# Patient Record
Sex: Female | Born: 1940 | Race: Black or African American | Hispanic: No | Marital: Married | Smoking: Never smoker
Health system: Southern US, Community
[De-identification: ages and names within clinical notes are randomized; demographics above are authoritative.]

## PROBLEM LIST (undated history)

## (undated) DIAGNOSIS — M109 Gout, unspecified: Secondary | ICD-10-CM

## (undated) DIAGNOSIS — R112 Nausea with vomiting, unspecified: Secondary | ICD-10-CM

## (undated) DIAGNOSIS — R200 Anesthesia of skin: Secondary | ICD-10-CM

## (undated) DIAGNOSIS — I517 Cardiomegaly: Secondary | ICD-10-CM

## (undated) DIAGNOSIS — K5790 Diverticulosis of intestine, part unspecified, without perforation or abscess without bleeding: Secondary | ICD-10-CM

## (undated) DIAGNOSIS — H269 Unspecified cataract: Secondary | ICD-10-CM

## (undated) DIAGNOSIS — I722 Aneurysm of renal artery: Secondary | ICD-10-CM

## (undated) DIAGNOSIS — E669 Obesity, unspecified: Secondary | ICD-10-CM

## (undated) DIAGNOSIS — I519 Heart disease, unspecified: Secondary | ICD-10-CM

## (undated) DIAGNOSIS — I712 Thoracic aortic aneurysm, without rupture: Secondary | ICD-10-CM

## (undated) DIAGNOSIS — K648 Other hemorrhoids: Secondary | ICD-10-CM

## (undated) DIAGNOSIS — N950 Postmenopausal bleeding: Secondary | ICD-10-CM

## (undated) DIAGNOSIS — J189 Pneumonia, unspecified organism: Secondary | ICD-10-CM

## (undated) DIAGNOSIS — M7122 Synovial cyst of popliteal space [Baker], left knee: Secondary | ICD-10-CM

## (undated) DIAGNOSIS — S83289A Other tear of lateral meniscus, current injury, unspecified knee, initial encounter: Secondary | ICD-10-CM

## (undated) DIAGNOSIS — N281 Cyst of kidney, acquired: Secondary | ICD-10-CM

## (undated) DIAGNOSIS — E7800 Pure hypercholesterolemia, unspecified: Secondary | ICD-10-CM

## (undated) DIAGNOSIS — I700 Atherosclerosis of aorta: Secondary | ICD-10-CM

## (undated) DIAGNOSIS — E119 Type 2 diabetes mellitus without complications: Secondary | ICD-10-CM

## (undated) DIAGNOSIS — Z8669 Personal history of other diseases of the nervous system and sense organs: Secondary | ICD-10-CM

## (undated) DIAGNOSIS — Z862 Personal history of diseases of the blood and blood-forming organs and certain disorders involving the immune mechanism: Secondary | ICD-10-CM

## (undated) DIAGNOSIS — I10 Essential (primary) hypertension: Secondary | ICD-10-CM

## (undated) DIAGNOSIS — K7689 Other specified diseases of liver: Secondary | ICD-10-CM

## (undated) DIAGNOSIS — Z9889 Other specified postprocedural states: Secondary | ICD-10-CM

## (undated) DIAGNOSIS — M1990 Unspecified osteoarthritis, unspecified site: Secondary | ICD-10-CM

## (undated) HISTORY — PX: COLONOSCOPY: SHX174

## (undated) HISTORY — DX: Type 2 diabetes mellitus without complications: E11.9

## (undated) HISTORY — PX: ABDOMINAL HYSTERECTOMY: SHX81

## (undated) HISTORY — PX: DILATION AND CURETTAGE OF UTERUS: SHX78

---

## 2001-03-10 ENCOUNTER — Encounter: Payer: Self-pay | Admitting: *Deleted

## 2001-03-10 ENCOUNTER — Ambulatory Visit (HOSPITAL_COMMUNITY): Admission: RE | Admit: 2001-03-10 | Discharge: 2001-03-10 | Payer: Self-pay | Admitting: *Deleted

## 2002-04-23 ENCOUNTER — Encounter: Payer: Self-pay | Admitting: *Deleted

## 2002-04-23 ENCOUNTER — Ambulatory Visit (HOSPITAL_COMMUNITY): Admission: RE | Admit: 2002-04-23 | Discharge: 2002-04-23 | Payer: Self-pay | Admitting: *Deleted

## 2002-11-08 ENCOUNTER — Emergency Department (HOSPITAL_COMMUNITY): Admission: EM | Admit: 2002-11-08 | Discharge: 2002-11-08 | Payer: Self-pay | Admitting: Internal Medicine

## 2003-05-21 ENCOUNTER — Ambulatory Visit (HOSPITAL_COMMUNITY): Admission: RE | Admit: 2003-05-21 | Discharge: 2003-05-21 | Payer: Self-pay | Admitting: Family Medicine

## 2004-10-28 ENCOUNTER — Emergency Department (HOSPITAL_COMMUNITY): Admission: EM | Admit: 2004-10-28 | Discharge: 2004-10-28 | Payer: Self-pay | Admitting: Emergency Medicine

## 2004-11-04 ENCOUNTER — Ambulatory Visit: Payer: Self-pay | Admitting: Family Medicine

## 2004-11-05 ENCOUNTER — Ambulatory Visit (HOSPITAL_COMMUNITY): Admission: RE | Admit: 2004-11-05 | Discharge: 2004-11-05 | Payer: Self-pay | Admitting: Family Medicine

## 2005-03-04 ENCOUNTER — Ambulatory Visit: Payer: Self-pay | Admitting: Orthopedic Surgery

## 2005-03-09 ENCOUNTER — Ambulatory Visit (HOSPITAL_COMMUNITY): Admission: RE | Admit: 2005-03-09 | Discharge: 2005-03-09 | Payer: Self-pay | Admitting: Orthopedic Surgery

## 2005-03-15 ENCOUNTER — Ambulatory Visit: Payer: Self-pay | Admitting: Orthopedic Surgery

## 2005-04-12 ENCOUNTER — Ambulatory Visit: Payer: Self-pay | Admitting: Orthopedic Surgery

## 2005-05-03 ENCOUNTER — Ambulatory Visit: Payer: Self-pay | Admitting: Orthopedic Surgery

## 2006-08-13 ENCOUNTER — Emergency Department (HOSPITAL_COMMUNITY): Admission: EM | Admit: 2006-08-13 | Discharge: 2006-08-13 | Payer: Self-pay | Admitting: Emergency Medicine

## 2007-03-10 ENCOUNTER — Ambulatory Visit (HOSPITAL_COMMUNITY): Admission: RE | Admit: 2007-03-10 | Discharge: 2007-03-10 | Payer: Self-pay | Admitting: Family Medicine

## 2007-03-14 ENCOUNTER — Ambulatory Visit: Payer: Self-pay | Admitting: Gastroenterology

## 2007-04-25 ENCOUNTER — Ambulatory Visit: Payer: Self-pay | Admitting: Gastroenterology

## 2007-04-25 ENCOUNTER — Ambulatory Visit (HOSPITAL_COMMUNITY): Admission: RE | Admit: 2007-04-25 | Discharge: 2007-04-25 | Payer: Self-pay | Admitting: Gastroenterology

## 2007-04-25 ENCOUNTER — Encounter: Payer: Self-pay | Admitting: Gastroenterology

## 2008-10-17 ENCOUNTER — Ambulatory Visit: Payer: Self-pay | Admitting: Family Medicine

## 2008-10-17 DIAGNOSIS — K5730 Diverticulosis of large intestine without perforation or abscess without bleeding: Secondary | ICD-10-CM | POA: Insufficient documentation

## 2008-10-17 DIAGNOSIS — E669 Obesity, unspecified: Secondary | ICD-10-CM | POA: Insufficient documentation

## 2008-10-17 DIAGNOSIS — M109 Gout, unspecified: Secondary | ICD-10-CM

## 2008-10-17 DIAGNOSIS — E1169 Type 2 diabetes mellitus with other specified complication: Secondary | ICD-10-CM

## 2008-10-17 DIAGNOSIS — I10 Essential (primary) hypertension: Secondary | ICD-10-CM | POA: Insufficient documentation

## 2008-10-17 DIAGNOSIS — G43909 Migraine, unspecified, not intractable, without status migrainosus: Secondary | ICD-10-CM | POA: Insufficient documentation

## 2008-10-17 DIAGNOSIS — M129 Arthropathy, unspecified: Secondary | ICD-10-CM | POA: Insufficient documentation

## 2008-10-17 DIAGNOSIS — K648 Other hemorrhoids: Secondary | ICD-10-CM | POA: Insufficient documentation

## 2008-10-17 DIAGNOSIS — E785 Hyperlipidemia, unspecified: Secondary | ICD-10-CM

## 2008-10-17 LAB — CONVERTED CEMR LAB
Glucose, Urine, Semiquant: NEGATIVE
Ketones, urine, test strip: NEGATIVE
Nitrite: NEGATIVE
Protein, U semiquant: 30
Specific Gravity, Urine: 1.015
WBC Urine, dipstick: NEGATIVE
pH: 5.5

## 2008-10-18 ENCOUNTER — Encounter (INDEPENDENT_AMBULATORY_CARE_PROVIDER_SITE_OTHER): Payer: Self-pay | Admitting: Family Medicine

## 2008-10-21 LAB — CONVERTED CEMR LAB
Albumin: 3.8 g/dL (ref 3.5–5.2)
Alkaline Phosphatase: 104 units/L (ref 39–117)
BUN: 20 mg/dL (ref 6–23)
Basophils Relative: 0 % (ref 0–1)
Cholesterol: 176 mg/dL (ref 0–200)
Eosinophils Absolute: 0.1 10*3/uL (ref 0.0–0.7)
Eosinophils Relative: 1 % (ref 0–5)
HCT: 39.5 % (ref 36.0–46.0)
LDL Cholesterol: 107 mg/dL — ABNORMAL HIGH (ref 0–99)
Lymphocytes Relative: 34 % (ref 12–46)
MCHC: 32.9 g/dL (ref 30.0–36.0)
Monocytes Absolute: 0.4 10*3/uL (ref 0.1–1.0)
Monocytes Relative: 5 % (ref 3–12)
Neutro Abs: 4.7 10*3/uL (ref 1.7–7.7)
Platelets: 273 10*3/uL (ref 150–400)
RDW: 15.6 % — ABNORMAL HIGH (ref 11.5–15.5)
Total CHOL/HDL Ratio: 4.5
Triglycerides: 149 mg/dL (ref <150)

## 2008-10-28 ENCOUNTER — Encounter (INDEPENDENT_AMBULATORY_CARE_PROVIDER_SITE_OTHER): Payer: Self-pay | Admitting: Family Medicine

## 2008-11-01 ENCOUNTER — Ambulatory Visit: Payer: Self-pay | Admitting: Family Medicine

## 2008-11-01 DIAGNOSIS — R7309 Other abnormal glucose: Secondary | ICD-10-CM | POA: Insufficient documentation

## 2008-11-01 DIAGNOSIS — E876 Hypokalemia: Secondary | ICD-10-CM

## 2008-11-01 LAB — CONVERTED CEMR LAB
Cholesterol, target level: 200 mg/dL
LDL Goal: 130 mg/dL

## 2008-11-05 ENCOUNTER — Telehealth (INDEPENDENT_AMBULATORY_CARE_PROVIDER_SITE_OTHER): Payer: Self-pay | Admitting: Family Medicine

## 2008-11-15 ENCOUNTER — Ambulatory Visit (HOSPITAL_COMMUNITY): Admission: RE | Admit: 2008-11-15 | Discharge: 2008-11-15 | Payer: Self-pay | Admitting: Family Medicine

## 2009-04-10 ENCOUNTER — Encounter: Payer: Self-pay | Admitting: Family Medicine

## 2009-05-21 ENCOUNTER — Ambulatory Visit (HOSPITAL_COMMUNITY): Admission: RE | Admit: 2009-05-21 | Discharge: 2009-05-21 | Payer: Self-pay | Admitting: Orthopaedic Surgery

## 2010-06-28 ENCOUNTER — Encounter: Payer: Self-pay | Admitting: Family Medicine

## 2010-09-06 HISTORY — PX: CATARACT EXTRACTION: SUR2

## 2010-10-06 HISTORY — PX: CATARACT EXTRACTION: SUR2

## 2010-10-20 NOTE — Consult Note (Signed)
NAME:  [name], [name]               ACCOUNT NO.:  [id number]   MEDICAL RECORD NO.:  [id number]          PATIENT TYPE:  AMB   LOCATION:  DAY                           FACILITY:  [facility]   PHYSICIAN:  Kassie Mends, M.D.      DATE OF BIRTH:  1940/10/23   DATE OF CONSULTATION:  03/14/2007  DATE OF DISCHARGE:                                 CONSULTATION   REASON FOR CONSULTATION:  Hematochezia, schedule colonoscopy.   HISTORY OF PRESENT ILLNESS:  [name] is a pleasant 70 year old lady  who presents today to schedule colonoscopy at the request of Dr.  Joselyn Arrow office. The patient states she recently had 1 episode of  bright red blood per rectum. She had been putting off having a  colonoscopy for quite some time and this scared her enough to have her  go ahead and schedule the appointment today. She denies any  constipation, abdominal pain, melena, nausea, vomiting, dysphagia,  odynophagia, or weight loss. She occasionally has heartburn. Denies any  chest pain or shortness of breath. She has never had a colonoscopy. She  states her paternal first cousin has had colon cancer.   CURRENT MEDICATIONS:  Norvasc 10 mg daily, Simvastatin 20 mg daily,  Triamterene/HCTZ 37.5/25 mg daily, Allopurinol 100 mg daily,  Indomethacin 50 mg t.i.d. p.r.n.   ALLERGIES:  NO KNOWN DRUG ALLERGIES.   PAST MEDICAL HISTORY:  Hypertension, hypercholesterolemia, migraine  headaches, and gout.   PAST SURGICAL HISTORY:  Oophorectomy.   FAMILY HISTORY:  Father deceased at age 8 of unknown type of cancer.  Mother is 20 years old and has a history of hypertension, diabetes,  chronic renal insufficiency and congestive heart failure. Paternal first  cousin has had colorectal cancer. She had a sister who died of blood  clots.   SOCIAL HISTORY:  She is married and has 2 grown children. 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 She has never been a smoker. No  alcohol use.   REVIEW OF SYSTEMS:   CONSTITUTIONAL:  No weight loss. CARDIOPULMONARY:  See HPI. GENITOURINARY:  Denies history of hematuria.   PHYSICAL EXAMINATION:  VITAL SIGNS:  Weight 196. Height 5 foot 2.  Temperature 97.6. Blood pressure 130/80. Pulse 72.  GENERAL:  A pleasant, well developed, well nourished female in no acute  distress.  SKIN:  Warm and dry. No jaundice.  HEENT:  Sclerae nonicteric. Oropharyngeal mucosa moist and pink. No  lesions, erythema, or exudate.  NECK:  No lymphadenopathy or thyromegaly.  CHEST:  Lungs are clear to auscultation.  CARDIOVASCULAR:  Regular rate and rhythm. Normal S1 and S2. No murmurs,  rubs, or gallops.  ABDOMEN:  Positive bowel sounds. Soft, obese but symmetrical. Nontender.  No organomegaly or masses. No rebound tenderness with guarding. No  abdominal hernias.  EXTREMITIES:  No edema.   IMPRESSION:  [name] is a 70 year old lady who had a single episode  of hematochezia recently. This may be due to a benign anorectal source  such as hemorrhoids. She has never had colorectal cancer screening.  Recommend diagnostic colonoscopy in the near  future.   PLAN:  1. Colonoscopy with Dr. Kassie Mends. I have discussed risks,      alternatives, and benefits with regards to the risk of reaction to      medication, bleeding, infection, and perforation. The patient is      agreeable to proceed.   I want to think Dr. Reynolds Bowl for allowing [place] to take part in the  care of this patient.      Tana Coast, P.A.      Kassie Mends, M.D.  Electronically Signed    LL/MEDQ  D:  03/14/2007  T:  03/14/2007  Job:  [id number]   cc:   Reynolds Bowl, M.D.   Kassie Mends, M.D.  [address]  [place] , [place] [address]

## 2010-10-20 NOTE — Op Note (Signed)
[name], [name]               ACCOUNT NO.:  [id number]   MEDICAL RECORD NO.:  [id number]          PATIENT TYPE:  AMB   LOCATION:  DAY                           FACILITY:  [facility]   PHYSICIAN:  Kassie Mends, M.D.      DATE OF BIRTH:  1941/04/27   DATE OF PROCEDURE:  04/25/2007  DATE OF DISCHARGE:                               OPERATIVE REPORT   PROCEDURE:  Colonoscopy with cold forceps biopsy.   INDICATION FOR EXAM:  [name] is a 70 year old female who presents  with bright red blood per rectum that usually occurs with her bowel  movements.   FINDINGS:  1. Nodular appearing rectal mucosa.  Biopsies obtained via cold      forceps.  2. Moderate internal hemorrhoids.  Otherwise, no polyps, masses,      inflammatory changes or AVMs.  3. Sigmoid diverticulosis.   DIAGNOSIS:  The likely source of [name] intermittent hematochezia is  hemorrhoids.   RECOMMENDATIONS:  1. Will call [name] with results of her biopsies.  2. Will need a screening colonoscopy, most likely, in ten years.  3. No aspirin, NSAIDs or anticoagulation for five days.  4. She should follow a high fiber diet.  She is given a handout on      hemorrhoids and diverticulosis.  5. Anusol-HC per rectum every 12 hours for 14 days.  6. If her rectal bleeding persists, then would consider general      surgery referral for hemorrhoidectomy.   MEDICATIONS:  1. Demerol 50 mg IV.  2. Versed 4 mg IV.   PROCEDURE TECHNIQUE:  Physical exam was performed.  Informed consent was  obtained from the patient after explaining the benefits, risks and  alternatives to the procedure.  The patient was connected to the monitor  and placed in the left lateral position.  Continuous oxygen was provided  by nasal cannula. IV medicines were administered through an indwelling  cannula.  After the administration of sedation and rectal exam, the  patient's rectum was  intubated.  The scope was advanced under direct visualization to  the  cecum.  The scope was removed slowly by carefully examining the color,  texture, anatomy and integrity of the mucosa on the way out.  The  patient was recovered in endoscopy and discharged home in satisfactory  condition.      Kassie Mends, M.D.  Electronically Signed     SM/MEDQ  D:  04/25/2007  T:  04/25/2007  Job:  [id number]

## 2010-12-16 ENCOUNTER — Other Ambulatory Visit (HOSPITAL_COMMUNITY): Payer: Self-pay | Admitting: Internal Medicine

## 2010-12-16 DIAGNOSIS — Z139 Encounter for screening, unspecified: Secondary | ICD-10-CM

## 2010-12-24 ENCOUNTER — Ambulatory Visit (HOSPITAL_COMMUNITY): Payer: Medicare FFS

## 2010-12-29 ENCOUNTER — Ambulatory Visit (HOSPITAL_COMMUNITY)
Admission: RE | Admit: 2010-12-29 | Discharge: 2010-12-29 | Disposition: A | Discharge status: Home / Self Care | Payer: Medicare PPO | Source: Ambulatory Visit | Attending: Internal Medicine | Admitting: Internal Medicine

## 2010-12-29 DIAGNOSIS — Z1231 Encounter for screening mammogram for malignant neoplasm of breast: Secondary | ICD-10-CM | POA: Insufficient documentation

## 2010-12-29 DIAGNOSIS — Z139 Encounter for screening, unspecified: Secondary | ICD-10-CM

## 2012-04-02 ENCOUNTER — Emergency Department (HOSPITAL_COMMUNITY)
Admission: EM | Admit: 2012-04-02 | Discharge: 2012-04-02 | Disposition: A | Discharge status: Home / Self Care | Payer: Medicare Other | Attending: Emergency Medicine | Admitting: Emergency Medicine

## 2012-04-02 ENCOUNTER — Encounter (HOSPITAL_COMMUNITY): Payer: Self-pay

## 2012-04-02 ENCOUNTER — Emergency Department (HOSPITAL_COMMUNITY): Payer: Medicare Other

## 2012-04-02 DIAGNOSIS — Z79899 Other long term (current) drug therapy: Secondary | ICD-10-CM | POA: Insufficient documentation

## 2012-04-02 DIAGNOSIS — M109 Gout, unspecified: Secondary | ICD-10-CM | POA: Insufficient documentation

## 2012-04-02 DIAGNOSIS — E7800 Pure hypercholesterolemia, unspecified: Secondary | ICD-10-CM | POA: Insufficient documentation

## 2012-04-02 DIAGNOSIS — I10 Essential (primary) hypertension: Secondary | ICD-10-CM | POA: Insufficient documentation

## 2012-04-02 DIAGNOSIS — J40 Bronchitis, not specified as acute or chronic: Secondary | ICD-10-CM

## 2012-04-02 HISTORY — DX: Gout, unspecified: M10.9

## 2012-04-02 HISTORY — DX: Essential (primary) hypertension: I10

## 2012-04-02 HISTORY — DX: Pure hypercholesterolemia, unspecified: E78.00

## 2012-04-02 MED ORDER — PREDNISONE 20 MG PO TABS
60.0000 mg | ORAL_TABLET | Freq: Once | ORAL | Status: AC
  Administered 2012-04-02: 60 mg via ORAL
  Filled 2012-04-02: qty 2
  Filled 2012-04-02: qty 1

## 2012-04-02 MED ORDER — BENZONATATE 100 MG PO CAPS: 200.0000 mg | ORAL_CAPSULE | Freq: Two times a day (BID) | ORAL | Status: DC | PRN

## 2012-04-02 MED ORDER — PREDNISONE 20 MG PO TABS: 20.0000 mg | ORAL_TABLET | Freq: Two times a day (BID) | ORAL | Status: DC

## 2012-04-02 MED ORDER — IPRATROPIUM BROMIDE 0.02 % IN SOLN
0.5000 mg | Freq: Once | RESPIRATORY_TRACT | Status: AC
  Administered 2012-04-02: 0.5 mg via RESPIRATORY_TRACT
  Filled 2012-04-02: qty 2.5

## 2012-04-02 MED ORDER — BENZONATATE 100 MG PO CAPS
200.0000 mg | ORAL_CAPSULE | Freq: Once | ORAL | Status: AC
  Administered 2012-04-02: 200 mg via ORAL
  Filled 2012-04-02: qty 2

## 2012-04-02 MED ORDER — ALBUTEROL SULFATE HFA 108 (90 BASE) MCG/ACT IN AERS
2.0000 | INHALATION_SPRAY | RESPIRATORY_TRACT | Status: DC | PRN
  Administered 2012-04-02: 2 via RESPIRATORY_TRACT
  Filled 2012-04-02: qty 6.7

## 2012-04-02 MED ORDER — AEROCHAMBER Z-STAT PLUS/MEDIUM MISC: 1.0000 | Freq: Once | Status: DC

## 2012-04-02 MED ORDER — ALBUTEROL SULFATE (5 MG/ML) 0.5% IN NEBU
5.0000 mg | INHALATION_SOLUTION | Freq: Once | RESPIRATORY_TRACT | Status: AC
  Administered 2012-04-02: 5 mg via RESPIRATORY_TRACT
  Filled 2012-04-02: qty 1

## 2012-04-02 NOTE — Progress Notes (Signed)
Instructed patient on use of Albuterol MDI with spacer. Patient was able to demonstrate her understanding. 

## 2012-04-02 NOTE — ED Notes (Signed)
C/o cough that is persistent and unable to catch breath at times. C/o tightness in chest when coughs. Resp even/nonlabored. Dry cough present in triage.

## 2012-04-02 NOTE — ED Provider Notes (Cosign Needed)
History     CSN: [id number]  Arrival date & time 04/02/12  1134   First MD Initiated Contact with Patient 04/02/12 1304      Chief Complaint  Patient presents with  . Cough    (Consider location/radiation/quality/duration/timing/severity/associated sxs/prior treatment) HPI Comments: [name] is a 71 y.o. Female who presents complaining of cough that is worsening. It is nonproductive. She has mild, shortness of breath. Symptoms began with a sore throat, URI symptoms, and vomiting 5 days ago. She improved, after 1 day, then been worse  since. She had a fever initially, but none now. She is tolerating oral fluids, and fluid. She does not feel weak or dizzy. She is taking over-the-counter cough medicines without relief. She's taking her regular medications. There are no known modifying factors.  Patient is a 71 y.o. female presenting with cough. The history is provided by the patient.  Cough    Past Medical History  Diagnosis Date  . Hypertension   . Hypercholesteremia   . Gout     Past Surgical History  Procedure Date  . Abdominal hysterectomy     No family history on file.  History  Substance Use Topics  . Smoking status: Never Smoker   . Smokeless tobacco: Not on file  . Alcohol Use: No    OB History    Grav Para Term Preterm Abortions TAB SAB Ect Mult Living                  Review of Systems  Respiratory: Positive for cough.   All other systems reviewed and are negative.    Allergies  Review of patient's allergies indicates no known allergies.  Home Medications   Current Outpatient Rx  Name Route Sig Dispense Refill  . ALLOPURINOL 100 MG PO TABS Oral Take 100 mg by mouth daily.    Marland Kitchen AMLODIPINE BESYLATE 10 MG PO TABS Oral Take 10 mg by mouth daily.    Marland Kitchen LOSARTAN POTASSIUM 100 MG PO TABS Oral Take 100 mg by mouth daily.    Marland Kitchen PHENYLEPHRINE-APAP-GUAIFENESIN 5-325-200 MG PO TABS Oral Take 2 tablets by mouth 2 (two) times daily.    Marland Kitchen SIMVASTATIN 40  MG PO TABS Oral Take 40 mg by mouth every evening.    Marland Kitchen BENZONATATE 100 MG PO CAPS Oral Take 2 capsules (200 mg total) by mouth 2 (two) times daily as needed for cough. 30 capsule 0  . PREDNISONE 20 MG PO TABS Oral Take 1 tablet (20 mg total) by mouth 2 (two) times daily. 10 tablet 0    BP 140/77  Pulse 80  Temp 97.9 F (36.6 C) (Oral)  Resp 18  Ht 5\' 2"  (1.575 m)  Wt 195 lb (88.451 kg)  BMI 35.67 kg/m2  SpO2 95%  Physical Exam  Nursing note and vitals reviewed. Constitutional: She is oriented to person, place, and time. She appears well-developed and well-nourished.  HENT:  Head: Normocephalic and atraumatic.  Eyes: Conjunctivae normal and EOM are normal. Pupils are equal, round, and reactive to light.  Neck: Normal range of motion and phonation normal. Neck supple.  Cardiovascular: Normal rate, regular rhythm and intact distal pulses.   Pulmonary/Chest: Effort normal. She exhibits no tenderness.       Mildly prolonged expirations, and no wheezes, rales, or rhonchi  Abdominal: Soft. She exhibits no distension. There is no tenderness. There is no guarding.  Musculoskeletal: Normal range of motion.  Neurological: She is alert and oriented to person, place, and  time. She has normal strength. She exhibits normal muscle tone.  Skin: Skin is warm and dry.  Psychiatric: She has a normal mood and affect. Her behavior is normal. Judgment and thought content normal.    ED Course  Procedures (including critical care time) \ Clinical syndrome consistent with bronchitis. We'll try nebulizer , and prednisone. Patient reports improvement after treatment. Cough has resolved.  Nursing notes, applicable records and vitals reviewed.  Radiologic Images/Reports reviewed.    Labs Reviewed - No data to display Dg Chest 2 View  04/02/2012  *RADIOLOGY REPORT*  Clinical Data: Cough, hypertension.  CHEST - 2 VIEW  Comparison: 11/05/2004  Findings: Tortuous ectatic thoracic aorta.  Heart size  upper limits normal.  Stable perihilar and bibasilar interstitial prominence. No confluent airspace infiltrate or overt edema.  No effusion. Spurring in the lower thoracic spine.  IMPRESSION:  1.  Stable bibasilar interstitial prominence.  No acute disease.   Original Report Authenticated By: Thora Lance III, M.D.      1. Bronchitis       MDM  Signs and symptoms of bronchitis, post URI. Doubt pneumonia, PE, or ACS. Doubt metabolic instability, serious bacterial infection or impending vascular collapse; the patient is stable for discharge.     Plan: Home Medications- prednisone albuterol inhaler, and Tessalon Perles; Home Treatments- rest; Recommended follow up- PCP of choice, when necessary   [name], MD 04/02/12 2205

## 2012-06-07 DIAGNOSIS — S83289A Other tear of lateral meniscus, current injury, unspecified knee, initial encounter: Secondary | ICD-10-CM

## 2012-06-07 DIAGNOSIS — M7122 Synovial cyst of popliteal space [Baker], left knee: Secondary | ICD-10-CM

## 2012-06-07 HISTORY — DX: Other tear of lateral meniscus, current injury, unspecified knee, initial encounter: S83.289A

## 2012-06-07 HISTORY — DX: Synovial cyst of popliteal space (Baker), left knee: M71.22

## 2012-09-21 ENCOUNTER — Other Ambulatory Visit (HOSPITAL_COMMUNITY): Payer: Self-pay | Admitting: Internal Medicine

## 2012-09-21 DIAGNOSIS — R52 Pain, unspecified: Secondary | ICD-10-CM

## 2012-09-21 DIAGNOSIS — R609 Edema, unspecified: Secondary | ICD-10-CM

## 2012-09-26 ENCOUNTER — Ambulatory Visit (HOSPITAL_COMMUNITY): Payer: Medicare Other

## 2012-09-27 ENCOUNTER — Ambulatory Visit (HOSPITAL_COMMUNITY)
Admission: RE | Admit: 2012-09-27 | Discharge: 2012-09-27 | Disposition: A | Discharge status: Home / Self Care | Payer: Medicare Other | Source: Ambulatory Visit | Attending: Internal Medicine | Admitting: Internal Medicine

## 2012-09-27 DIAGNOSIS — M23349 Other meniscus derangements, anterior horn of lateral meniscus, unspecified knee: Secondary | ICD-10-CM | POA: Insufficient documentation

## 2012-09-27 DIAGNOSIS — R609 Edema, unspecified: Secondary | ICD-10-CM

## 2012-09-27 DIAGNOSIS — M25569 Pain in unspecified knee: Secondary | ICD-10-CM | POA: Insufficient documentation

## 2012-09-27 DIAGNOSIS — R52 Pain, unspecified: Secondary | ICD-10-CM

## 2014-03-29 ENCOUNTER — Other Ambulatory Visit (HOSPITAL_COMMUNITY): Payer: Self-pay | Admitting: Internal Medicine

## 2014-03-29 DIAGNOSIS — N939 Abnormal uterine and vaginal bleeding, unspecified: Secondary | ICD-10-CM

## 2014-04-02 ENCOUNTER — Other Ambulatory Visit (HOSPITAL_COMMUNITY): Payer: Self-pay | Admitting: Internal Medicine

## 2014-04-02 DIAGNOSIS — N939 Abnormal uterine and vaginal bleeding, unspecified: Secondary | ICD-10-CM

## 2014-04-03 ENCOUNTER — Ambulatory Visit (HOSPITAL_COMMUNITY): Payer: Medicare HMO

## 2014-04-03 ENCOUNTER — Ambulatory Visit (HOSPITAL_COMMUNITY)
Admission: RE | Admit: 2014-04-03 | Discharge: 2014-04-03 | Disposition: A | Discharge status: Home / Self Care | Payer: Medicare HMO | Source: Ambulatory Visit | Attending: Internal Medicine | Admitting: Internal Medicine

## 2014-04-03 DIAGNOSIS — N939 Abnormal uterine and vaginal bleeding, unspecified: Secondary | ICD-10-CM | POA: Diagnosis not present

## 2014-04-03 DIAGNOSIS — Z90710 Acquired absence of both cervix and uterus: Secondary | ICD-10-CM | POA: Diagnosis not present

## 2014-05-18 ENCOUNTER — Encounter (HOSPITAL_COMMUNITY): Payer: Self-pay | Admitting: Emergency Medicine

## 2014-05-18 ENCOUNTER — Emergency Department (HOSPITAL_COMMUNITY)
Admission: EM | Admit: 2014-05-18 | Discharge: 2014-05-18 | Disposition: A | Discharge status: Home / Self Care | Payer: Commercial Managed Care - HMO | Attending: Emergency Medicine | Admitting: Emergency Medicine

## 2014-05-18 ENCOUNTER — Emergency Department (HOSPITAL_COMMUNITY): Payer: Commercial Managed Care - HMO

## 2014-05-18 DIAGNOSIS — Y9289 Other specified places as the place of occurrence of the external cause: Secondary | ICD-10-CM | POA: Diagnosis not present

## 2014-05-18 DIAGNOSIS — E780 Pure hypercholesterolemia: Secondary | ICD-10-CM | POA: Diagnosis not present

## 2014-05-18 DIAGNOSIS — W19XXXA Unspecified fall, initial encounter: Secondary | ICD-10-CM

## 2014-05-18 DIAGNOSIS — Z7952 Long term (current) use of systemic steroids: Secondary | ICD-10-CM | POA: Insufficient documentation

## 2014-05-18 DIAGNOSIS — Y998 Other external cause status: Secondary | ICD-10-CM | POA: Diagnosis not present

## 2014-05-18 DIAGNOSIS — M109 Gout, unspecified: Secondary | ICD-10-CM | POA: Diagnosis not present

## 2014-05-18 DIAGNOSIS — S8391XA Sprain of unspecified site of right knee, initial encounter: Secondary | ICD-10-CM | POA: Diagnosis not present

## 2014-05-18 DIAGNOSIS — Y9389 Activity, other specified: Secondary | ICD-10-CM | POA: Diagnosis not present

## 2014-05-18 DIAGNOSIS — I10 Essential (primary) hypertension: Secondary | ICD-10-CM | POA: Diagnosis not present

## 2014-05-18 DIAGNOSIS — S8392XA Sprain of unspecified site of left knee, initial encounter: Secondary | ICD-10-CM | POA: Insufficient documentation

## 2014-05-18 DIAGNOSIS — Z79899 Other long term (current) drug therapy: Secondary | ICD-10-CM | POA: Insufficient documentation

## 2014-05-18 DIAGNOSIS — S8992XA Unspecified injury of left lower leg, initial encounter: Secondary | ICD-10-CM | POA: Diagnosis present

## 2014-05-18 DIAGNOSIS — W010XXA Fall on same level from slipping, tripping and stumbling without subsequent striking against object, initial encounter: Secondary | ICD-10-CM | POA: Diagnosis not present

## 2014-05-18 MED ORDER — HYDROCODONE-ACETAMINOPHEN 5-325 MG PO TABS: ORAL_TABLET | ORAL | Status: DC

## 2014-05-18 MED ORDER — HYDROCODONE-ACETAMINOPHEN 5-325 MG PO TABS
1.0000 | ORAL_TABLET | Freq: Once | ORAL | Status: AC
  Administered 2014-05-18: 1 via ORAL
  Filled 2014-05-18: qty 1

## 2014-05-18 NOTE — Discharge Instructions (Signed)

## 2014-05-18 NOTE — ED Provider Notes (Signed)
CSN: [id number]     Arrival date & time 05/18/14  1644 History   First MD Initiated Contact with Patient 05/18/14 1803     Chief Complaint  Patient presents with  . Fall     (Consider location/radiation/quality/duration/timing/severity/associated sxs/prior Treatment) HPI   [name] is a 73 y.o. female who presents to the Emergency Department complaining of bilateral knee pain after a fall earlier today.  States she tripped over a box and landed on both knees.  States pain is worse to the right knee.  She also reports mild swelling.  She has taken a ES Tylenol and Naprosyn with minimal relief.  She states that she is able to stand and bear weight, but the painful.  She denies numbness, weakness of the extremities, open wounds or other injuries.     Past Medical History  Diagnosis Date  . Hypertension   . Hypercholesteremia   . Gout    Past Surgical History  Procedure Laterality Date  . Abdominal hysterectomy     History reviewed. No pertinent family history. History  Substance Use Topics  . Smoking status: Never Smoker   . Smokeless tobacco: Not on file  . Alcohol Use: No   OB History    No data available     Review of Systems  Constitutional: Negative for fever and chills.  Genitourinary: Negative for dysuria and difficulty urinating.  Musculoskeletal: Positive for joint swelling and arthralgias. Negative for neck pain.  Skin: Negative for color change and wound.  Neurological: Negative for dizziness, facial asymmetry, weakness, numbness and headaches.  All other systems reviewed and are negative.     Allergies  Review of patient's allergies indicates no known allergies.  Home Medications   Prior to Admission medications   Medication Sig Start Date End Date Taking? Authorizing Provider  allopurinol (ZYLOPRIM) 100 MG tablet Take 100 mg by mouth daily.    Historical Provider, MD  amLODipine (NORVASC) 10 MG tablet Take 10 mg by mouth daily.    Historical  Provider, MD  benzonatate (TESSALON) 100 MG capsule Take 2 capsules (200 mg total) by mouth 2 (two) times daily as needed for cough. 04/02/12   Flint MelterElliott L Wentz, MD  losartan (COZAAR) 100 MG tablet Take 100 mg by mouth daily.    Historical Provider, MD  Phenylephrine-APAP-Guaifenesin [facility](MUCINEX SINUS-MAX PRESS & PAIN) 5-325-200 MG TABS Take 2 tablets by mouth 2 (two) times daily.    Historical Provider, MD  predniSONE (DELTASONE) 20 MG tablet Take 1 tablet (20 mg total) by mouth 2 (two) times daily. 04/02/12   Flint MelterElliott L Wentz, MD  simvastatin (ZOCOR) 40 MG tablet Take 40 mg by mouth every evening.    Historical Provider, MD   BP 136/82 mmHg  Pulse 68  Temp(Src) 98.5 F (36.9 C) (Oral)  Resp 20  Ht 5\' 2"  (1.575 m)  Wt 190 lb (86.183 kg)  BMI 34.74 kg/m2  SpO2 97% Physical Exam  Constitutional: She is oriented to person, place, and time. She appears well-developed and well-nourished. No distress.  HENT:  Head: Normocephalic and atraumatic.  Neck: Normal range of motion. Neck supple.  Cardiovascular: Normal rate, regular rhythm, normal heart sounds and intact distal pulses.   No murmur heard. Pulmonary/Chest: Effort normal and breath sounds normal. No respiratory distress.  Musculoskeletal: She exhibits edema and tenderness.  ttp of the bilateral knees, Right > left.  No erythema, effusion, or step-off deformity.  Pt has full ROM with pain reproduced with flexion.  DP  pulse brisk, distal sensation intact. Calf is soft and NT.  Neurological: She is alert and oriented to person, place, and time. She exhibits normal muscle tone. Coordination normal.  Skin: Skin is warm and dry. No erythema.  Nursing note and vitals reviewed.   ED Course  Procedures (including critical care time) Labs Review Labs Reviewed - No data to display  Imaging Review Dg Knee Complete 4 Views Left  05/18/2014   CLINICAL DATA:  Larey SeatFell, pain anterior knees, swelling anterior right knee below patella, prior  arthroscopic surgery on left knee  EXAM: LEFT KNEE - COMPLETE 4+ VIEW  COMPARISON:  None.  FINDINGS: No fracture.  No dislocation.  There is tricompartmental joint space narrowing most evident involving the lateral compartment. Marginal osteophytes project from all 3 compartments. There is diffuse bone demineralization. No joint effusion. Soft tissues are unremarkable.  IMPRESSION: No fracture, dislocation or acute finding.   Electronically Signed   By: Amie Portlandavid  Ormond M.D.   On: 05/18/2014 19:16   Dg Knee Complete 4 Views Right  05/18/2014   CLINICAL DATA:  Larey SeatFell, pain anterior knees, swelling anterior right knee below patella, prior arthroscopic surgery on left knee  EXAM: RIGHT KNEE - COMPLETE 4+ VIEW  COMPARISON:  None.  FINDINGS: No fracture or dislocation.  There is tricompartmental joint space narrowing, most evident involving the lateral compartment. There are prominent marginal osteophytes from all 3 compartments. There is no significant joint effusion. Soft tissues are unremarkable. Bones are demineralized.  IMPRESSION: No fracture or dislocation or acute finding.   Electronically Signed   By: Amie Portlandavid  Ormond M.D.   On: 05/18/2014 19:15     EKG Interpretation None      MDM   Final diagnoses:  Fall  Knee sprain, right, initial encounter  Knee sprain, left, initial encounter   Pt with bilateral knee pain after a fall directly onto her knees.  Pt has full ROM of both knees without obvious ligament instability or bony deformity.  ACE wrap applied.  Patient reports improvement in her sx's after vicodin.  She agrees to elevate, ice and close orthopedic f/u in few days if the sx's are not improving.        Jaxden Blyden L. Rowe Robertriplett, PA-C 05/20/14 1208  Glynn OctaveStephen Rancour, MD 05/20/14 [phone]

## 2014-05-18 NOTE — ED Notes (Signed)
Patient with no complaints at this time. Respirations even and unlabored. Skin warm/dry. Discharge instructions reviewed with patient at this time. Patient given opportunity to voice concerns/ask questions. Patient discharged at this time and left Emergency Department with steady gait.   

## 2014-05-18 NOTE — ED Notes (Signed)
Pt tripped over box in floor and when she landed, she hit both knees. Pt c/o bilateral knee pain and difficulty standing.

## 2014-07-29 DIAGNOSIS — M170 Bilateral primary osteoarthritis of knee: Secondary | ICD-10-CM | POA: Diagnosis not present

## 2014-08-18 DIAGNOSIS — Z0100 Encounter for examination of eyes and vision without abnormal findings: Secondary | ICD-10-CM | POA: Diagnosis not present

## 2014-08-21 DIAGNOSIS — H524 Presbyopia: Secondary | ICD-10-CM | POA: Diagnosis not present

## 2014-08-21 DIAGNOSIS — H5210 Myopia, unspecified eye: Secondary | ICD-10-CM | POA: Diagnosis not present

## 2014-09-16 ENCOUNTER — Other Ambulatory Visit: Payer: Self-pay | Admitting: *Deleted

## 2014-09-16 NOTE — Patient Outreach (Signed)
[facility]) Care Management  09/16/2014  Doreen BeamBessie Y Lerew 1941/05/16 [id number]  Telephone contact with patient for disease management. Patient states she has received [employer]  educational materials on hypertension and has reviewed them.   States she is currently taking blood pressure at home on weekly basis. States she does not know her target blood pressure range. Patient referred to MD office to get target blood pressure. RN case manager reviewed symptoms that may indicate that blood pressure is above the norm. Patient was able to states 2 reasons to call for emergency medical help.   Plan: will continue with interventions as listed in Care Plan.  Patient agrees with next appointment date.  Colleen CanLinda Anjolina Byrer, RN BSN CCM Care Management Coordinator [facility]  [phone]

## 2014-09-26 ENCOUNTER — Other Ambulatory Visit: Payer: Self-pay | Admitting: *Deleted

## 2014-09-26 NOTE — Patient Outreach (Signed)
[facility]) Care Management  09/26/2014  [name] 06-Jul-1940 [id number]   Follow up for disease management. Patient states she has received Transport plannereducational materials.  Patient states unable to complete health assessments today. Wants to be called back tomorrow.  Plan: Set appointment for telephone call. Patient agrees with appointment time. Patient also agrees to read educational materials.  Melanie CanLinda Alieah Brinton, RN BSN CCM Care Management Coordinator [facility] Care [facility]  [phone]

## 2014-10-03 ENCOUNTER — Encounter: Payer: Self-pay | Admitting: *Deleted

## 2014-10-04 ENCOUNTER — Other Ambulatory Visit: Payer: Self-pay | Admitting: *Deleted

## 2014-10-04 ENCOUNTER — Encounter: Payer: Self-pay | Admitting: *Deleted

## 2014-10-04 VITALS — Ht 62.0 in | Wt 190.0 lb

## 2014-10-04 DIAGNOSIS — I10 Essential (primary) hypertension: Secondary | ICD-10-CM

## 2014-10-04 NOTE — Patient Outreach (Signed)
[facility]) Care Management  10/04/2014  Doreen BeamBessie Y Mawhinney [birth year] [id number]  Follow up call to patient for disease management. Patient states has been taking blood pressure several times weekly which range from 126/69 to 150/80.  States she is recording values and plans to take to MD office. Next appointment with primary care scheduled for November 17, 2014.  No hospital admissions or emergency room admissions since we spoke last. Reinforcement and review of  hypertension educational materials done with patient.   Plan: with continue with care plan as noted. Will [facility] along with Advance Directive information. Follow up scheduled for next month. Patient agrees with appointment time.  Colleen CanLinda Evonte Prestage, RN BSN CCM Care Management Coordinator [facility] Care Management  [phone]  .

## 2014-10-07 ENCOUNTER — Encounter: Payer: Self-pay | Admitting: *Deleted

## 2014-10-25 ENCOUNTER — Other Ambulatory Visit: Payer: Self-pay | Admitting: *Deleted

## 2014-10-25 NOTE — Patient Outreach (Signed)
[facility]) Care Management  10/25/2014  Doreen BeamBessie Y Navarette 29-Mar-1941 [id number]  Telephone follow up with patient -Disease Management -Hypertension. Patient voices that she is currently taking blood pressure several times weekly and recording. Plans to take values to next doctor's appointment in June. This week blood pressure ranges from 124/73 to 134/70. States she is walking on regular basis and plans to start going to gym. States she has [employer] benefit which she will utilize.   Interventions in care plan re-inforced with patient.  States she is taking medications as directed and has had no changes in prescriptions.    Plan-will continue to follow care plan as noted.  Patient agrees with next telephonic appointment   Colleen CanLinda Carmen Vallecillo, RN BSN CCM Care Management Coordinator [facility] Care Management  [phone]

## 2014-11-19 DIAGNOSIS — R7301 Impaired fasting glucose: Secondary | ICD-10-CM | POA: Diagnosis not present

## 2014-11-19 DIAGNOSIS — E782 Mixed hyperlipidemia: Secondary | ICD-10-CM | POA: Diagnosis not present

## 2014-11-19 DIAGNOSIS — I10 Essential (primary) hypertension: Secondary | ICD-10-CM | POA: Diagnosis not present

## 2014-11-20 ENCOUNTER — Other Ambulatory Visit: Payer: Self-pay | Admitting: *Deleted

## 2014-11-20 NOTE — Patient Outreach (Signed)
[facility]) Care Management  11/20/2014  [name] 05/19/1941 [id number]  Follow up call to patient regarding disease management.  Patient reports that she has had blood work drawn and will have primary care office visit tomorrow.  States she has been checking her blood pressure weekly and recording values. States this week blood pressure reading is 134/74. States she is consistently walking at least 30 minutes 5 days weekly. Has lost 8 lbs in 1 month. States she is taking medications as prescribed consistently , watching her food intake by cutting down on portions and bread. States following low salt diet. States over all she feels better. Legs feel stronger, and she is sleeping better.  Plan-Will follow up with patient post primary care office visit. Continue with care plan as noted. Patient agrees with appointment set up for next telephonic appointment.   Colleen Can, RN BSN CCM Care Management Coordinator [facility] Care Management  [phone]

## 2014-11-21 DIAGNOSIS — E876 Hypokalemia: Secondary | ICD-10-CM | POA: Diagnosis not present

## 2014-11-21 DIAGNOSIS — I10 Essential (primary) hypertension: Secondary | ICD-10-CM | POA: Diagnosis not present

## 2014-11-21 DIAGNOSIS — E1165 Type 2 diabetes mellitus with hyperglycemia: Secondary | ICD-10-CM | POA: Diagnosis not present

## 2014-11-21 DIAGNOSIS — E785 Hyperlipidemia, unspecified: Secondary | ICD-10-CM | POA: Diagnosis not present

## 2015-01-01 DIAGNOSIS — I10 Essential (primary) hypertension: Secondary | ICD-10-CM | POA: Diagnosis not present

## 2015-01-01 DIAGNOSIS — E1165 Type 2 diabetes mellitus with hyperglycemia: Secondary | ICD-10-CM | POA: Diagnosis not present

## 2015-01-01 DIAGNOSIS — E119 Type 2 diabetes mellitus without complications: Secondary | ICD-10-CM | POA: Diagnosis not present

## 2015-01-01 DIAGNOSIS — E876 Hypokalemia: Secondary | ICD-10-CM | POA: Diagnosis not present

## 2015-01-01 DIAGNOSIS — E785 Hyperlipidemia, unspecified: Secondary | ICD-10-CM | POA: Diagnosis not present

## 2015-01-15 ENCOUNTER — Other Ambulatory Visit: Payer: Self-pay | Admitting: *Deleted

## 2015-01-15 DIAGNOSIS — E119 Type 2 diabetes mellitus without complications: Secondary | ICD-10-CM

## 2015-01-15 NOTE — Patient Outreach (Signed)
[facility]) Care Management  01/15/2015  [name] 1941-01-24 [id number]  Telephone call to patient who voices that she continues to maintain good blood pressure that runs in 130's/70's.  States her MD is pleased with her numbers and states her target should be where it is now 130;s/70's. Patient taking medications as prescribed by MD.  Patient states new concern is she has new diagnosis of diabetes and has been placed on oral medication. States Hemoglobin A1c level is currently 8.5 and MD wants it down to 7.5. States she is scheduled to see nutritionist. States she has glucometer and is checking her blood sugar daily.   Goals for hypertension disease management have been met. Patient concerned about diabetes diagnosis. Patient advised that telephonic follow up and disease management could continue for new diagnosis of diabetes. Patient consents.  Plan: will refer to [facility] for Diabetes disease management.   Update sent to MD via letter. Melanie Daisy, RN BSN [place] Management Coordinator [facility] Care Management  [phone]

## 2015-01-24 ENCOUNTER — Encounter: Payer: Self-pay | Admitting: *Deleted

## 2015-01-24 NOTE — Patient Outreach (Signed)
[facility]) Care Management  01/24/2015  THRESEA DOBLE 01/28/1941 [id number]   Request from Colleen Can, RN to assign RN Health Coach, Fleeta Emmer, RN assigned.  Thanks, Corrie Mckusick. Sharlee Blew [facility] Care Management [facility] CM Assistant Phone: [phone] Fax: [phone]

## 2015-01-28 ENCOUNTER — Other Ambulatory Visit: Payer: Self-pay

## 2015-01-28 NOTE — Patient Outreach (Signed)
[facility]) Care Management  [facility] Care Manager  01/28/2015   [name] December 18, 1940 [id number]  Telephone call to patient for initial health coach outreach.  Patient receptive to call.  Patient newly diagnosed diabetic 2 months ago per patient.  She reports her last A1c was 7.5 but MD goal is 6.5.  Patient reports recent increase of metformin and her blood sugars in the am are in the 90's.  Patient is active.  She reports she walks daily and tries to do 10,000 steps per day.  Discussed with patient diabetic diet and answered patient questions.  Patient has appointment for diabetic teaching in September.    Current Medications:  Current Outpatient Prescriptions  Medication Sig Dispense Refill  . allopurinol (ZYLOPRIM) 100 MG tablet Take 100 mg by mouth daily.    Marland Kitchen amLODipine (NORVASC) 10 MG tablet Take 10 mg by mouth daily.    . chlorthalidone (HYGROTON) 25 MG tablet Take 12.5 mg by mouth daily.    Marland Kitchen losartan (COZAAR) 100 MG tablet Take 100 mg by mouth daily.    . metFORMIN (GLUCOPHAGE) 1000 MG tablet Take 1,000 mg by mouth 2 (two) times daily with a meal.    . naproxen (NAPROSYN) 500 MG tablet Take 500 mg by mouth 2 (two) times daily.    . simvastatin (ZOCOR) 40 MG tablet Take 40 mg by mouth every evening.    . benzonatate (TESSALON) 100 MG capsule Take 2 capsules (200 mg total) by mouth 2 (two) times daily as needed for cough. (Patient not taking: Reported on 09/16/2014) 30 capsule 0  . HYDROcodone-acetaminophen (NORCO/VICODIN) 5-325 MG per tablet Take one-two tabs po q 4-6 hrs prn pain (Patient not taking: Reported on 09/16/2014) 20 tablet 0  . Phenylephrine-APAP-Guaifenesin (MUCINEX SINUS-MAX PRESS & PAIN) 5-325-200 MG TABS Take 2 tablets by mouth 2 (two) times daily.    . predniSONE (DELTASONE) 20 MG tablet Take 1 tablet (20 mg total) by mouth 2 (two) times daily. (Patient not taking: Reported on 09/16/2014) 10 tablet 0   No current facility-administered medications  for this visit.    Functional Status:  In your present state of health, do you have any difficulty performing the following activities: 01/28/2015 11/20/2014  Hearing? N N  Vision? N N  Difficulty concentrating or making decisions? N N  Walking or climbing stairs? N N  Dressing or bathing? N N  Doing errands, shopping? N N  Preparing Food and eating ? N N  Using the Toilet? N N  In the past six months, have you accidently leaked urine? N Y  Do you have problems with loss of bowel control? N N  Managing your Medications? N N  Managing your Finances? N N  Housekeeping or managing your Housekeeping? N N    Fall/Depression Screening: PHQ 2/9 Scores 01/28/2015 11/20/2014 10/04/2014  PHQ - 2 Score 0 0 0   THN CM Care Plan Problem One        Patient Outreach Telephone from 01/28/2015 in [facility] [employer]   Care Plan Problem One  Knowledge deficit related to self-management of diabetes.   Care Plan for Problem One  Active   THN Long Term Goal (31-90 days)  Patient will report A1c decrease by 0.5 points within 90 days   [facility]N Long Term Goal Start Date  01/28/15   Interventions for Problem One Long Term Goal  EMMI information will be sent to patient regarding blood sugar and management.  Discussion with patient about diet  and portion control..    THN CM Short Term Goal #1 (0-30 days)  Patient will be able to name 3-4 foods high in carbohydrates within 30 days.   THN CM Short Term Goal #1 Start Date  01/28/15   Interventions for Short Term Goal #1  Discussed with patient specific foods that are high in carbohydrates.     THN CM Short Term Goal #2 (0-30 days)  Patient will name at least three signs of hypoglycemia within 30 days   THN CM Short Term Goal #2 Start Date  01/28/15   Interventions for Short Term Goal #2  Named signs of hypoglycemia with patient. Discussed snacks to consume to quickly increase blood sugar.    [facility] CM Care Plan Problem Two        Patient Outreach Telephone from  01/28/2015 in [facility] for Problem Two  Not Active      Assessment: Patient will benefit from health coach outreach for education related to new diagnoses of diabetes.    Plan: RN Health Coach will provide ongoing education for patient on diabetes through phone calls and sending printed information to patient for further discussion. RN Health Coach will send welcome packet with consent to patient as well as printed information on diabetes. RN Health Coach will send initial barriers letter, assessment, and care plan to primary care physician. RN Health Coach will contact patient within one month and patient agrees to next contact.    Bary Leriche, RN, MSN [facility] Care Management RN Telephonic Health Coach [phone]

## 2015-02-25 ENCOUNTER — Ambulatory Visit: Payer: Medicare HMO

## 2015-02-25 ENCOUNTER — Other Ambulatory Visit: Payer: Self-pay

## 2015-02-25 NOTE — Patient Outreach (Signed)
[facility]) Care Management  02/25/2015  DECARLA SIEMEN [birth year] [id number]  Telephone call to patient for monthly call. Female answered stating that patient is not in.  Health Coach contact information given.  Advised health coach will attempt contact another time.    Plan: RN Health Coach will contact patient within one week.    Bary Leriche, RN, MSN [facility] Care Management RN Telephonic Health Coach [phone]

## 2015-02-26 ENCOUNTER — Ambulatory Visit: Payer: Medicare HMO | Admitting: Nutrition

## 2015-02-28 ENCOUNTER — Other Ambulatory Visit: Payer: Self-pay

## 2015-02-28 NOTE — Patient Outreach (Signed)
[facility]) Care Management  [facility] Care Manager  02/28/2015   [name] March 12, 1941 [id number]  Subjective:  Monthly telephone call to patient she reports she has been having some problems with her blood sugar dropping. Patient reports that on 02-25-15 she began feeling sweaty and shaking.  When she checked her blood sugar it was 83. She drank orange juice then ate a snack. She checked her sugar again and it was up to 115.  She also reports the next day a random check was 97.  Discussed with patient signs of hypoglycemia, low blood sugar care, and keeping a record.  She verbalized understanding.  Also advised patient that she should notify her physician as an adjustment in her medication maybe necessary.  She states she was going to call her doctor as her appointment is not until mid October.  Offered to transfer patient to doctor office after health coach call.  Patient in agreement.   Objective:   Current Medications:  Current Outpatient Prescriptions  Medication Sig Dispense Refill  . allopurinol (ZYLOPRIM) 100 MG tablet Take 100 mg by mouth daily.    Marland Kitchen amLODipine (NORVASC) 10 MG tablet Take 10 mg by mouth daily.    . chlorthalidone (HYGROTON) 25 MG tablet Take 12.5 mg by mouth daily.    Marland Kitchen losartan (COZAAR) 100 MG tablet Take 100 mg by mouth daily.    . metFORMIN (GLUCOPHAGE) 1000 MG tablet Take 1,000 mg by mouth 2 (two) times daily with a meal.    . naproxen (NAPROSYN) 500 MG tablet Take 500 mg by mouth 2 (two) times daily.    . simvastatin (ZOCOR) 40 MG tablet Take 40 mg by mouth every evening.    . benzonatate (TESSALON) 100 MG capsule Take 2 capsules (200 mg total) by mouth 2 (two) times daily as needed for cough. (Patient not taking: Reported on 09/16/2014) 30 capsule 0  . HYDROcodone-acetaminophen (NORCO/VICODIN) 5-325 MG per tablet Take one-two tabs po q 4-6 hrs prn pain (Patient not taking: Reported on 09/16/2014) 20 tablet 0  . Phenylephrine-APAP-Guaifenesin  (MUCINEX SINUS-MAX PRESS & PAIN) 5-325-200 MG TABS Take 2 tablets by mouth 2 (two) times daily.    . predniSONE (DELTASONE) 20 MG tablet Take 1 tablet (20 mg total) by mouth 2 (two) times daily. (Patient not taking: Reported on 09/16/2014) 10 tablet 0   No current facility-administered medications for this visit.    Functional Status:  In your present state of health, do you have any difficulty performing the following activities: 01/28/2015 11/20/2014  Hearing? N N  Vision? N N  Difficulty concentrating or making decisions? N N  Walking or climbing stairs? N N  Dressing or bathing? N N  Doing errands, shopping? N N  Preparing Food and eating ? N N  Using the Toilet? N N  In the past six months, have you accidently leaked urine? N Y  Do you have problems with loss of bowel control? N N  Managing your Medications? N N  Managing your Finances? N N  Housekeeping or managing your Housekeeping? N N    Fall/Depression Screening: PHQ 2/9 Scores 02/28/2015 01/28/2015 11/20/2014 10/04/2014  PHQ - 2 Score 0 0 0 0    Assessment: Patient continues to benefit from health coach outreach for disease management.     Plan:  [facility] CM Care Plan Problem One        Most Recent Value   Care Plan Problem One  Knowledge deficit related to self-management of  diabetes.   Role Documenting the Problem One  Health Coach   Care Plan for Problem One  Active   THN Long Term Goal (31-90 days)  Patient will report A1c decrease by 0.5 points within 90 days   THN Long Term Goal Start Date  01/28/15   Interventions for Problem One Long Term Goal  Reviewed EMMI information on diabetes with patient.   THN CM Short Term Goal #1 (0-30 days)  Patient will be able to name 3-4 foods high in carbohydrates within 30 days.   THN CM Short Term Goal #1 Start Date  02/28/15 [goal continued]   Interventions for Short Term Goal #1  Reviewed with patient foods high in carbohydrates.    THN CM Short Term Goal #2 (0-30 days)  Patient  will name at least three signs of hypoglycemia within 30 days   THN CM Short Term Goal #2 Start Date  02/28/15 [goal continued]   Interventions for Short Term Goal #2  Reviewed EMMI low blood sugar care with patient.      [facility] CM Care Plan Problem Two        Most Recent Value   Care Plan for Problem Two  Not Active     RN Health Coach transferred patient to physician office.  RN Health Coach will contact patient within one month and patient agrees to next outreach.   Bary Leriche, RN, MSN [facility] Care Management RN Telephonic Health Coach [phone]

## 2015-03-10 DIAGNOSIS — I10 Essential (primary) hypertension: Secondary | ICD-10-CM | POA: Diagnosis not present

## 2015-03-10 DIAGNOSIS — E1165 Type 2 diabetes mellitus with hyperglycemia: Secondary | ICD-10-CM | POA: Diagnosis not present

## 2015-03-12 DIAGNOSIS — E785 Hyperlipidemia, unspecified: Secondary | ICD-10-CM | POA: Diagnosis not present

## 2015-03-12 DIAGNOSIS — I10 Essential (primary) hypertension: Secondary | ICD-10-CM | POA: Diagnosis not present

## 2015-03-12 DIAGNOSIS — Z23 Encounter for immunization: Secondary | ICD-10-CM | POA: Diagnosis not present

## 2015-03-12 DIAGNOSIS — E1165 Type 2 diabetes mellitus with hyperglycemia: Secondary | ICD-10-CM | POA: Diagnosis not present

## 2015-03-13 ENCOUNTER — Other Ambulatory Visit (HOSPITAL_COMMUNITY): Payer: Self-pay | Admitting: Internal Medicine

## 2015-03-13 DIAGNOSIS — Z1231 Encounter for screening mammogram for malignant neoplasm of breast: Secondary | ICD-10-CM

## 2015-03-20 ENCOUNTER — Ambulatory Visit: Payer: Medicare HMO | Admitting: Nutrition

## 2015-03-21 ENCOUNTER — Ambulatory Visit (HOSPITAL_COMMUNITY): Payer: Medicare HMO

## 2015-03-27 ENCOUNTER — Other Ambulatory Visit (HOSPITAL_COMMUNITY): Payer: Self-pay | Admitting: Internal Medicine

## 2015-03-27 ENCOUNTER — Other Ambulatory Visit: Payer: Self-pay

## 2015-03-27 DIAGNOSIS — M8580 Other specified disorders of bone density and structure, unspecified site: Secondary | ICD-10-CM

## 2015-03-27 NOTE — Patient Outreach (Signed)
[facility]) Care Management  [place]  03/27/2015   [name] 1940/07/28 [id number]  Subjective: Telephone call to patient for monthly call. Patient reports she is doing good. She reports that her blood pressures have been fine. Last reported 142/78.  Next primary care visit in January 2017.     Diabetes:  Reports that she saw her primary doctor this month and A1c was down to 6.5.  However, patient is having some problems with hypoglycemia.  Patient able to verbalize signs of hypoglycemia and action.  She reports she did take her log book to her doctor.  He did not change her medication and she made him aware of her lower blood sugars and that she sometimes does not take her written dose due to hypoglycemic episodes.  Discussed with patient diabetes and blood sugar goals.  Advised patient to keep physician informed about her status.  She verbalized understanding.    Objective:   Current Medications:  Current Outpatient Prescriptions  Medication Sig Dispense Refill  . allopurinol (ZYLOPRIM) 100 MG tablet Take 100 mg by mouth daily.    Marland Kitchen amLODipine (NORVASC) 10 MG tablet Take 10 mg by mouth daily.    . chlorthalidone (HYGROTON) 25 MG tablet Take 12.5 mg by mouth daily.    Marland Kitchen losartan (COZAAR) 100 MG tablet Take 100 mg by mouth daily.    . metFORMIN (GLUCOPHAGE) 1000 MG tablet Take 1,000 mg by mouth 2 (two) times daily with a meal.    . naproxen (NAPROSYN) 500 MG tablet Take 500 mg by mouth 2 (two) times daily.    Marland Kitchen Phenylephrine-APAP-Guaifenesin (MUCINEX SINUS-MAX PRESS & PAIN) 5-325-200 MG TABS Take 2 tablets by mouth 2 (two) times daily.    . simvastatin (ZOCOR) 40 MG tablet Take 40 mg by mouth every evening.    . benzonatate (TESSALON) 100 MG capsule Take 2 capsules (200 mg total) by mouth 2 (two) times daily as needed for cough. (Patient not taking: Reported on 09/16/2014) 30 capsule 0  . HYDROcodone-acetaminophen (NORCO/VICODIN) 5-325 MG per tablet Take  one-two tabs po q 4-6 hrs prn pain (Patient not taking: Reported on 09/16/2014) 20 tablet 0  . predniSONE (DELTASONE) 20 MG tablet Take 1 tablet (20 mg total) by mouth 2 (two) times daily. (Patient not taking: Reported on 09/16/2014) 10 tablet 0   No current facility-administered medications for this visit.    Functional Status:  In your present state of health, do you have any difficulty performing the following activities: 01/28/2015 11/20/2014  Hearing? N N  Vision? N N  Difficulty concentrating or making decisions? N N  Walking or climbing stairs? N N  Dressing or bathing? N N  Doing errands, shopping? N N  Preparing Food and eating ? N N  Using the Toilet? N N  In the past six months, have you accidently leaked urine? N Y  Do you have problems with loss of bowel control? N N  Managing your Medications? N N  Managing your Finances? N N  Housekeeping or managing your Housekeeping? N N    Fall/Depression Screening: PHQ 2/9 Scores 03/27/2015 02/28/2015 01/28/2015 11/20/2014 10/04/2014  PHQ - 2 Score 0 0 0 0 0    Assessment: Patient continues to benefit from health coach outreach for disease management.    Plan:   [facility] CM Care Plan Problem One        Most Recent Value   Care Plan Problem One  Knowledge deficit related to self-management of  diabetes.   Role Documenting the Problem One  [place] for Problem One  Active   THN Long Term Goal (31-90 days)  Patient will report A1c decrease by 0.5 points within 90 days   THN Long Term Goal Start Date  01/28/15   [facility] Long Term Goal Met Date  03/27/15   Interventions for Problem One Long Term Goal  Reviewed A1c goals and diabetic diet.     THN CM Short Term Goal #1 (0-30 days)  Patient will be able to name 3-4 foods high in carbohydrates within 30 days.   THN CM Short Term Goal #1 Start Date  03/27/15 [goal continued]   Interventions for Short Term Goal #1  Named high carbohydrate foods for patient.     THN CM Short Term  Goal #2 (0-30 days)  Patient will name at least three signs of hypoglycemia within 30 days   THN CM Short Term Goal #2 Start Date  02/28/15 [goal continued]   [facility] CM Short Term Goal #2 Met Date  03/27/15   Interventions for Short Term Goal #2  Reviewed EMMI low blood sugar care with patient.     THN CM Short Term Goal #3 (0-30 days)  Patient will be able to 3 symptoms of hyperglycemia within 30 days.    THN CM Short Term Goal #3 Start Date  03/27/15   Interventions for Short Tern Goal #3  Reviewed signs of hyperglycemia with patient.     [facility] CM Care Plan Problem Two        Most Recent Value   Care Plan for Problem Two  Not Active     RN Health Coach will contact patient within one month and patient agrees to next outreach.  Jone Baseman, RN, MSN [place] [phone]

## 2015-04-09 ENCOUNTER — Ambulatory Visit (HOSPITAL_COMMUNITY)
Admission: RE | Admit: 2015-04-09 | Discharge: 2015-04-09 | Disposition: A | Discharge status: Home / Self Care | Payer: Commercial Managed Care - HMO | Source: Ambulatory Visit | Attending: Internal Medicine | Admitting: Internal Medicine

## 2015-04-09 DIAGNOSIS — M8580 Other specified disorders of bone density and structure, unspecified site: Secondary | ICD-10-CM

## 2015-04-09 DIAGNOSIS — Z1231 Encounter for screening mammogram for malignant neoplasm of breast: Secondary | ICD-10-CM | POA: Insufficient documentation

## 2015-04-09 DIAGNOSIS — Z780 Asymptomatic menopausal state: Secondary | ICD-10-CM | POA: Diagnosis not present

## 2015-04-24 ENCOUNTER — Other Ambulatory Visit: Payer: Self-pay

## 2015-04-24 NOTE — Patient Outreach (Signed)
[facility]) Care Management  [place]  04/24/2015   SELENIA MIHOK 1940-07-17 [id number]  Subjective: Telephone call to patient for monthly call. Patient reports she is doing good.  Diabetes: Patient still reports that her blood sugars are on the lower end.  Blood sugar this morning was 98.  She reports highest postprandial blood sugar was 158.  Patient states she will be taking her blood sugar log to her primary doctor on her next visit to discuss her blood sugar with hopes of decreasing medication as her blood sugars are lower.  Advised patient that her next  A1c would also help the physician determine if medication changes need to be made.  Coached patient on continuing limiting carbohydrates and sugars.  She verbalized understanding.     Objective:   Current Medications:  Current Outpatient Prescriptions  Medication Sig Dispense Refill  . allopurinol (ZYLOPRIM) 100 MG tablet Take 100 mg by mouth daily.    Marland Kitchen amLODipine (NORVASC) 10 MG tablet Take 10 mg by mouth daily.    . chlorthalidone (HYGROTON) 25 MG tablet Take 12.5 mg by mouth daily.    Marland Kitchen losartan (COZAAR) 100 MG tablet Take 100 mg by mouth daily.    . metFORMIN (GLUCOPHAGE) 1000 MG tablet Take 1,000 mg by mouth 2 (two) times daily with a meal.    . naproxen (NAPROSYN) 500 MG tablet Take 500 mg by mouth 2 (two) times daily.    . simvastatin (ZOCOR) 40 MG tablet Take 40 mg by mouth every evening.    . benzonatate (TESSALON) 100 MG capsule Take 2 capsules (200 mg total) by mouth 2 (two) times daily as needed for cough. (Patient not taking: Reported on 09/16/2014) 30 capsule 0  . HYDROcodone-acetaminophen (NORCO/VICODIN) 5-325 MG per tablet Take one-two tabs po q 4-6 hrs prn pain (Patient not taking: Reported on 09/16/2014) 20 tablet 0  . Phenylephrine-APAP-Guaifenesin (MUCINEX SINUS-MAX PRESS & PAIN) 5-325-200 MG TABS Take 2 tablets by mouth 2 (two) times daily.    . predniSONE (DELTASONE) 20 MG tablet  Take 1 tablet (20 mg total) by mouth 2 (two) times daily. (Patient not taking: Reported on 09/16/2014) 10 tablet 0   No current facility-administered medications for this visit.    Functional Status:  In your present state of health, do you have any difficulty performing the following activities: 01/28/2015 11/20/2014  Hearing? N N  Vision? N N  Difficulty concentrating or making decisions? N N  Walking or climbing stairs? N N  Dressing or bathing? N N  Doing errands, shopping? N N  Preparing Food and eating ? N N  Using the Toilet? N N  In the past six months, have you accidently leaked urine? N Y  Do you have problems with loss of bowel control? N N  Managing your Medications? N N  Managing your Finances? N N  Housekeeping or managing your Housekeeping? N N    Fall/Depression Screening: PHQ 2/9 Scores 04/24/2015 03/27/2015 02/28/2015 01/28/2015 11/20/2014 10/04/2014  PHQ - 2 Score 0 0 0 0 0 0    Assessment: Patient continues to benefit from health coach outreach for disease management.    Plan:  [facility] CM Care Plan Problem One        Most Recent Value   Care Plan Problem One  Knowledge deficit related to self-management of diabetes.   Role Documenting the Problem One  [place] for Problem One  Active   [facility] Long Term  Goal (31-90 days)  Patient will report A1c decrease by 0.5 points within 90 days   THN Long Term Goal Start Date  01/28/15   [facility] Long Term Goal Met Date  03/27/15   Interventions for Problem One Long Term Goal  Reviewed A1c goals and diabetic diet.     THN CM Short Term Goal #1 (0-30 days)  Patient will be able to name 3-4 foods high in carbohydrates within 30 days.   THN CM Short Term Goal #1 Start Date  03/27/15 [goal continued]   [facility] CM Short Term Goal #1 Met Date  04/24/15   Interventions for Short Term Goal #1  Named high carbohydrate foods for patient.     THN CM Short Term Goal #2 (0-30 days)  Patient will name at least three signs of hypoglycemia  within 30 days   THN CM Short Term Goal #2 Start Date  02/28/15 [goal continued]   [facility] CM Short Term Goal #2 Met Date  03/27/15   Interventions for Short Term Goal #2  Reviewed EMMI low blood sugar care with patient.     THN CM Short Term Goal #3 (0-30 days)  Patient will be able to 3 symptoms of hyperglycemia within 30 days.    THN CM Short Term Goal #3 Start Date  04/24/15 [goal continued]   Interventions for Short Tern Goal #3  [place] on signs and symptoms of hyperglycemia.      [facility] CM Care Plan Problem Two        Most Recent Value   Care Plan for Problem Two  Not Active     RN Health Coach will contact patient within one month and patient agrees to next outreach.    Jone Baseman, RN, MSN [place] [phone]

## 2015-05-09 ENCOUNTER — Encounter: Payer: Self-pay | Admitting: Nutrition

## 2015-05-09 ENCOUNTER — Encounter: Payer: Commercial Managed Care - HMO | Attending: Internal Medicine | Admitting: Nutrition

## 2015-05-09 DIAGNOSIS — E119 Type 2 diabetes mellitus without complications: Secondary | ICD-10-CM | POA: Insufficient documentation

## 2015-05-09 NOTE — Progress Notes (Signed)
  Medical Nutrition Therapy:  Appt start time: 0830 end time:  0930.   Assessment:  Primary concerns today: Diabetes. She lives with her husband. She does the shopping and cooking. Testing 1-2 times per day.  Metformin 1000 mg BID but hasn't been taking but 1000 mg a day because she was afraid it would drop her blood sugar in the middle of the night.  Walks for exercise 10,000 steps daily. Has improved BS from 8.5% down to 6.7% in three months with diet, exercise and Metformin. Feels better. Denies low blood sugar but has felt symptoms a few times if she doesn't eat on time.  Diet needs more consistency in CHO with meals and high fiber foods and low carb vegetables.   Preferred Learning Style:    No preference indicated    Learning Readiness:  Ready  Change in progress   MEDICATIONS: see list   DIETARY INTAKE:  24-hr recall:  B ( AM): Oatmeal, 1 ww toast and coffee, splenda Snk ( AM): fruit  L ( PM): apple, and salad, water Snk ( PM):  D ( PM): Baked chicken, string beans, water Snk ( PM): water Beverages: water  Usual physical activity: walk daily 10,000 steps.  Estimated energy needs: 1500 calories 170 g carbohydrates 112 g protein 42 g fat  Progress Towards Goal(s):  In progress.   Nutritional Diagnosis:  NB-1.1 Food and nutrition-related knowledge deficit As related to Diabetes.  As evidenced by A1C 6.7%.    Intervention:  Nutrition and Diabetes education provided on My Plate, CHO counting, meal planning, portion sizes, timing of meals, avoiding snacks between meals unless having a low blood sugar, target ranges for A1C and blood sugars, signs/symptoms and treatment of hyper/hypoglycemia, monitoring blood sugars, taking medications as prescribed, benefits of exercising 30 minutes per day and prevention of complications of DM.  Goals. Goals:  Follow Diabetes Meal Plan as instructed  Limit carbohydrate intake to 30-45 grams carbohydrate/meal  Add lean protein  foods to meals  Monitor glucose levels as instructed by your doctor  Aim for 30-1460mins of physical activity daily  Bring food record and glucose log to your next nutrition visit  Teaching Method Utilized:  Visual Auditory Hands on  Handouts given during visit include:  The Plate Method  Meal Plan Card  Barriers to learning/adherence to lifestyle change: None  Demonstrated degree of understanding via:  Teach Back   Monitoring/Evaluation:  Dietary intake, exercise, meal plan card, and body weight in 1 month(s).

## 2015-05-09 NOTE — Patient Instructions (Signed)
Goals:  Follow Diabetes Meal Plan as instructed  Limit carbohydrate intake to 30-45 grams carbohydrate/meal  Add lean protein foods to meals  Monitor glucose levels as instructed by your doctor  Aim for 30-260mins of physical activity daily  Bring food record and glucose log to your next nutrition visit

## 2015-05-20 ENCOUNTER — Ambulatory Visit: Payer: Medicare HMO

## 2015-05-20 ENCOUNTER — Other Ambulatory Visit: Payer: Self-pay

## 2015-05-20 NOTE — Patient Outreach (Signed)
[facility]) Care Management  05/20/2015  Melanie BeamBessie Y Black 1940/11/16 [id number]   Telephone call to patient for monthly outreach.  No answer.  HIPAA compliant voice message left.    Plan: RN Health Coach will contact patient within 1-2 weeks.    Bary Lericheionne J Javiel Canepa, RN, MSN [facility] Care Management RN Telephonic Health Coach [phone]

## 2015-05-27 ENCOUNTER — Ambulatory Visit: Payer: Self-pay

## 2015-05-27 ENCOUNTER — Other Ambulatory Visit: Payer: Self-pay

## 2015-05-27 NOTE — Patient Outreach (Signed)
[facility]) Care Management  05/27/2015  [name] 1940/07/16 [id number]   Telephone call to patient for monthly call. Female answers and states that patient is not home.  Message left for patient.  Plan: RN Health Coach will contact patient within 1-2 weeks.  Bary Lericheionne J Ashleah Valtierra, RN, MSN [facility] Care Management RN Telephonic Health Coach [phone]

## 2015-06-04 ENCOUNTER — Ambulatory Visit: Payer: Self-pay

## 2015-06-04 ENCOUNTER — Other Ambulatory Visit: Payer: Self-pay

## 2015-06-04 NOTE — Patient Outreach (Signed)
[facility]) Care Management  06/04/2015  Doreen BeamBessie Y Harned 11-09-1940 [id number]  Telephone call to patient for monthly outreach.  Husband states she is not in and advised to call around 9 or 10 in the morning.    Plan: RN Health Coach will attempt patient again within 1 week.    Bary Lericheionne J Shams Fill, RN, MSN [facility] Care Management RN Telephonic Health Coach [phone]

## 2015-06-06 ENCOUNTER — Other Ambulatory Visit: Payer: Self-pay

## 2015-06-06 NOTE — Patient Outreach (Signed)
[facility]) Care Management  [facility] Care Manager  06/06/2015   ROGENE METH [birth year] [id number]  Subjective: Telephone call to patient for monthly call.  Patient reports she has been busy with visitors over the holiday.  Patient reports she has done well with her sugars over the holiday and continuing her diet.  Patient reports blood sugar this morning was 86.  Discussed with patient signs and symptoms of hypo/hyperglycemia.  She verbalized understanding.    Objective:   Current Medications:  Current Outpatient Prescriptions  Medication Sig Dispense Refill  . allopurinol (ZYLOPRIM) 100 MG tablet Take 100 mg by mouth daily.    Marland Kitchen amLODipine (NORVASC) 10 MG tablet Take 10 mg by mouth daily.    . benzonatate (TESSALON) 100 MG capsule Take 2 capsules (200 mg total) by mouth 2 (two) times daily as needed for cough. 30 capsule 0  . chlorthalidone (HYGROTON) 25 MG tablet Take 12.5 mg by mouth daily.    Marland Kitchen losartan (COZAAR) 100 MG tablet Take 100 mg by mouth daily.    . metFORMIN (GLUCOPHAGE) 1000 MG tablet Take 1,000 mg by mouth 2 (two) times daily with a meal.    . naproxen (NAPROSYN) 500 MG tablet Take 500 mg by mouth 2 (two) times daily.    Marland Kitchen Phenylephrine-APAP-Guaifenesin (MUCINEX SINUS-MAX PRESS & PAIN) 5-325-200 MG TABS Take 2 tablets by mouth 2 (two) times daily.    . simvastatin (ZOCOR) 40 MG tablet Take 40 mg by mouth every evening.    Marland Kitchen HYDROcodone-acetaminophen (NORCO/VICODIN) 5-325 MG per tablet Take one-two tabs po q 4-6 hrs prn pain (Patient not taking: Reported on 06/06/2015) 20 tablet 0  . predniSONE (DELTASONE) 20 MG tablet Take 1 tablet (20 mg total) by mouth 2 (two) times daily. (Patient not taking: Reported on 09/16/2014) 10 tablet 0   No current facility-administered medications for this visit.    Functional Status:  In your present state of health, do you have any difficulty performing the following activities: 01/28/2015 11/20/2014  Hearing? N N   Vision? N N  Difficulty concentrating or making decisions? N N  Walking or climbing stairs? N N  Dressing or bathing? N N  Doing errands, shopping? N N  Preparing Food and eating ? N N  Using the Toilet? N N  In the past six months, have you accidently leaked urine? N Y  Do you have problems with loss of bowel control? N N  Managing your Medications? N N  Managing your Finances? N N  Housekeeping or managing your Housekeeping? N N    Fall/Depression Screening: PHQ 2/9 Scores 06/06/2015 05/09/2015 04/24/2015 03/27/2015 02/28/2015 01/28/2015 11/20/2014  PHQ - 2 Score 0 0 0 0 0 0 0    Assessment: Patient continues to benefit from health coach outreach for disease management and support.    Plan:  [facility] CM Care Plan Problem One        Most Recent Value   Care Plan Problem One  Knowledge deficit related to self-management of diabetes.   Role Documenting the Problem One  Health Coach   Care Plan for Problem One  Active   THN CM Short Term Goal #3 (0-30 days)  Patient will be able to 3 symptoms of hyperglycemia within 30 days.    THN CM Short Term Goal #3 Start Date  06/06/15 [goal continued]   Interventions for Short Tern Goal #3  RN Health Coach reviewed signs of hyperglycemia with patient.      THN  CM Care Plan Problem Two        Most Recent Value   Care Plan for Problem Two  Not Active     RN Health Coach will contact patient within one month and patient agrees to next outreach.    Bary Lericheionne J Markus Casten, RN, MSN [facility] Care Management RN Telephonic Health Coach [phone]

## 2015-06-13 ENCOUNTER — Ambulatory Visit: Payer: Medicare HMO | Admitting: Nutrition

## 2015-06-19 DIAGNOSIS — I10 Essential (primary) hypertension: Secondary | ICD-10-CM | POA: Diagnosis not present

## 2015-06-19 DIAGNOSIS — E1165 Type 2 diabetes mellitus with hyperglycemia: Secondary | ICD-10-CM | POA: Diagnosis not present

## 2015-06-25 ENCOUNTER — Other Ambulatory Visit: Payer: Self-pay

## 2015-06-25 NOTE — Patient Outreach (Addendum)
[facility]) Care Management  [facility] Care Manager  06/25/2015   [name] Jun 23, 1940 [id number]  Subjective: Telephone call to patient for monthly outreach call.  Patient reports she is doing well but shares she lost her brother last night.  She states she was at the hospital late last night.  Condolences offered.  Patient reports she has done well with her diabetes.  She states her sugar are mainly between 80-110.  Discussed with patient A1c goals and maintaining diabetic diet.  She verbalized understanding.  No concerns voiced by patient.    Objective:   Current Medications:  Current Outpatient Prescriptions  Medication Sig Dispense Refill  . allopurinol (ZYLOPRIM) 100 MG tablet Take 100 mg by mouth daily.    Marland Kitchen amLODipine (NORVASC) 10 MG tablet Take 10 mg by mouth daily.    . benzonatate (TESSALON) 100 MG capsule Take 2 capsules (200 mg total) by mouth 2 (two) times daily as needed for cough. 30 capsule 0  . chlorthalidone (HYGROTON) 25 MG tablet Take 12.5 mg by mouth daily.    Marland Kitchen losartan (COZAAR) 100 MG tablet Take 100 mg by mouth daily.    . metFORMIN (GLUCOPHAGE) 1000 MG tablet Take 1,000 mg by mouth 2 (two) times daily with a meal.    . naproxen (NAPROSYN) 500 MG tablet Take 500 mg by mouth 2 (two) times daily.    . simvastatin (ZOCOR) 40 MG tablet Take 40 mg by mouth every evening.    Marland Kitchen HYDROcodone-acetaminophen (NORCO/VICODIN) 5-325 MG per tablet Take one-two tabs po q 4-6 hrs prn pain (Patient not taking: Reported on 06/06/2015) 20 tablet 0  . Phenylephrine-APAP-Guaifenesin (MUCINEX SINUS-MAX PRESS & PAIN) 5-325-200 MG TABS Take 2 tablets by mouth 2 (two) times daily. Reported on 06/25/2015    . predniSONE (DELTASONE) 20 MG tablet Take 1 tablet (20 mg total) by mouth 2 (two) times daily. (Patient not taking: Reported on 09/16/2014) 10 tablet 0   No current facility-administered medications for this visit.    Functional Status:  In your present state of  health, do you have any difficulty performing the following activities: 01/28/2015 11/20/2014  Hearing? N N  Vision? N N  Difficulty concentrating or making decisions? N N  Walking or climbing stairs? N N  Dressing or bathing? N N  Doing errands, shopping? N N  Preparing Food and eating ? N N  Using the Toilet? N N  In the past six months, have you accidently leaked urine? N Y  Do you have problems with loss of bowel control? N N  Managing your Medications? N N  Managing your Finances? N N  Housekeeping or managing your Housekeeping? N N    Fall/Depression Screening: PHQ 2/9 Scores 06/25/2015 06/06/2015 05/09/2015 04/24/2015 03/27/2015 02/28/2015 01/28/2015  PHQ - 2 Score 0 0 0 0 0 0 0    Assessment: Patient continues to benefit from health coach outreach for disease management and support.    Plan:  [facility] CM Care Plan Problem One        Most Recent Value   Care Plan Problem One  Knowledge deficit related to self-management of diabetes.   Role Documenting the Problem One  Health Coach   Care Plan for Problem One  Active   THN Long Term Goal (31-90 days)  Patient will decrease A1c by 0.2 points within 90 days   THN Long Term Goal Start Date  06/25/15   Interventions for Problem One Long Term Goal  RN Health  coach  discussed A1c goals with patient and reiterated diet control.     THN CM Short Term Goal #3 (0-30 days)  Patient will be able to 3 symptoms of hyperglycemia within 30 days.    THN CM Short Term Goal #3 Start Date  06/25/15 [goal continued]   Interventions for Short Tern Goal #3  RN Health Coach reiterated signs of hyperglycemia with patient.      [facility] CM Care Plan Problem Two        Most Recent Value   Care Plan for Problem Two  Not Active     RN Health Coach will contact patient within one month and patient agrees to next outreach.    Bary Leriche, RN, MSN [facility] Care Management RN Telephonic Health Coach [phone]

## 2015-07-07 DIAGNOSIS — E782 Mixed hyperlipidemia: Secondary | ICD-10-CM | POA: Diagnosis not present

## 2015-07-07 DIAGNOSIS — D509 Iron deficiency anemia, unspecified: Secondary | ICD-10-CM | POA: Diagnosis not present

## 2015-07-07 DIAGNOSIS — E790 Hyperuricemia without signs of inflammatory arthritis and tophaceous disease: Secondary | ICD-10-CM | POA: Diagnosis not present

## 2015-07-07 DIAGNOSIS — E1165 Type 2 diabetes mellitus with hyperglycemia: Secondary | ICD-10-CM | POA: Diagnosis not present

## 2015-07-07 DIAGNOSIS — I10 Essential (primary) hypertension: Secondary | ICD-10-CM | POA: Diagnosis not present

## 2015-07-23 ENCOUNTER — Ambulatory Visit: Payer: Commercial Managed Care - HMO

## 2015-07-24 ENCOUNTER — Other Ambulatory Visit: Payer: Self-pay

## 2015-07-24 NOTE — Patient Outreach (Signed)
[place]) Care Management  [place]  07/24/2015   [name] Dec 26, 1940 [id number]  Subjective: Telephone call to patient for monthly outreach.  Patient reports she is doing ok. She reports she was Dr. [place] Crane recently and her A1c was 6.6.  She asked Dr. [place] Crane about taking her off of her medication as sometimes her blood sugars are in the 60's and 70's at times.  She states that he would not take her off unless her blood sugars consistently stayed low.  Explained to patient the relation between A1c and blood sugar. She verbalized understanding.  Will send patient information on the relation on A1c and blood sugar and EMMI information on diabetes.    Objective:   Current Medications:  Current Outpatient Prescriptions  Medication Sig Dispense Refill  . allopurinol (ZYLOPRIM) 100 MG tablet Take 100 mg by mouth daily.    Marland Kitchen amLODipine (NORVASC) 10 MG tablet Take 10 mg by mouth daily.    . benzonatate (TESSALON) 100 MG capsule Take 2 capsules (200 mg total) by mouth 2 (two) times daily as needed for cough. 30 capsule 0  . chlorthalidone (HYGROTON) 25 MG tablet Take 12.5 mg by mouth daily.    Marland Kitchen losartan (COZAAR) 100 MG tablet Take 100 mg by mouth daily.    . metFORMIN (GLUCOPHAGE) 1000 MG tablet Take 1,000 mg by mouth 2 (two) times daily with a meal.    . naproxen (NAPROSYN) 500 MG tablet Take 500 mg by mouth 2 (two) times daily.    Marland Kitchen Phenylephrine-APAP-Guaifenesin (MUCINEX SINUS-MAX PRESS & PAIN) 5-325-200 MG TABS Take 2 tablets by mouth 2 (two) times daily. Reported on 06/25/2015    . predniSONE (DELTASONE) 20 MG tablet Take 1 tablet (20 mg total) by mouth 2 (two) times daily. 10 tablet 0  . simvastatin (ZOCOR) 40 MG tablet Take 40 mg by mouth every evening.    Marland Kitchen HYDROcodone-acetaminophen (NORCO/VICODIN) 5-325 MG per tablet Take one-two tabs po q 4-6 hrs prn pain (Patient not taking: Reported on 06/06/2015) 20 tablet 0   No current facility-administered medications  for this visit.    Functional Status:  In your present state of health, do you have any difficulty performing the following activities: 01/28/2015 11/20/2014  Hearing? N N  Vision? N N  Difficulty concentrating or making decisions? N N  Walking or climbing stairs? N N  Dressing or bathing? N N  Doing errands, shopping? N N  Preparing Food and eating ? N N  Using the Toilet? N N  In the past six months, have you accidently leaked urine? N Y  Do you have problems with loss of bowel control? N N  Managing your Medications? N N  Managing your Finances? N N  Housekeeping or managing your Housekeeping? N N    Fall/Depression Screening: PHQ 2/9 Scores 07/24/2015 06/25/2015 06/06/2015 05/09/2015 04/24/2015 03/27/2015 02/28/2015  PHQ - 2 Score 0 0 0 0 0 0 0    Assessment: Patient continues to benefit from health coach outreach for disease management and support.    Plan:  [facility] CM Care Plan Problem One        Most Recent Value   Care Plan Problem One  Knowledge deficit related to self-management of diabetes.   Role Documenting the Problem One  [place] for Problem One  Active   THN Long Term Goal (31-90 days)  Patient will decrease A1c by 0.2 points within 90 days   THN Long  Term Goal Start Date  06/25/15   Interventions for Problem One Long Term Goal  RN Health coach  reviewed A1c goals with patient and reiterated diet control.     THN CM Short Term Goal #3 (0-30 days)  Patient will be able to 3 symptoms of hyperglycemia within 30 days.    THN CM Short Term Goal #3 Start Date  06/25/15 [goal continued]   [facility] CM Short Term Goal #3 Met Date  07/24/15   Interventions for Short Tern Goal #3  Patient able to verbalize three signs of hyperglycemia.     RN Health Coach will contact patient within one month and patient agrees to next outreach.    Jone Baseman, RN, MSN [place] [phone]

## 2015-08-19 ENCOUNTER — Other Ambulatory Visit: Payer: Self-pay

## 2015-08-19 NOTE — Patient Outreach (Signed)
[facility]N) Care Management  [facility]  08/19/2015   Melanie BeamBessie Y Black [birth year] [id number]  Subjective: Telephone call to patient for monthly call.  Patient reports she is doing pretty good. Reports that her sugar are between 86-100's. Patient denies pain or falls.  Discussed with patient importance of diet maintenance and use of metformin.  No concerns.    Objective:   Current Medications:  Current Outpatient Prescriptions  Medication Sig Dispense Refill  . allopurinol (ZYLOPRIM) 100 MG tablet Take 100 mg by mouth daily.    Marland Kitchen. amLODipine (NORVASC) 10 MG tablet Take 10 mg by mouth daily.    . benzonatate (TESSALON) 100 MG capsule Take 2 capsules (200 mg total) by mouth 2 (two) times daily as needed for cough. 30 capsule 0  . chlorthalidone (HYGROTON) 25 MG tablet Take 12.5 mg by mouth daily.    Marland Kitchen. losartan (COZAAR) 100 MG tablet Take 100 mg by mouth daily.    . metFORMIN (GLUCOPHAGE) 1000 MG tablet Take 1,000 mg by mouth 2 (two) times daily with a meal.    . naproxen (NAPROSYN) 500 MG tablet Take 500 mg by mouth 2 (two) times daily.    Marland Kitchen. Phenylephrine-APAP-Guaifenesin (MUCINEX SINUS-MAX PRESS & PAIN) 5-325-200 MG TABS Take 2 tablets by mouth 2 (two) times daily. Reported on 06/25/2015    . predniSONE (DELTASONE) 20 MG tablet Take 1 tablet (20 mg total) by mouth 2 (two) times daily. 10 tablet 0  . simvastatin (ZOCOR) 40 MG tablet Take 40 mg by mouth every evening.    Marland Kitchen. HYDROcodone-acetaminophen (NORCO/VICODIN) 5-325 MG per tablet Take one-two tabs po q 4-6 hrs prn pain (Patient not taking: Reported on 06/06/2015) 20 tablet 0   No current facility-administered medications for this visit.    Functional Status:  In your present state of health, do you have any difficulty performing the following activities: 01/28/2015 11/20/2014  Hearing? N N  Vision? N N  Difficulty concentrating or making decisions? N N  Walking or climbing stairs? N N  Dressing or bathing? N N   Doing errands, shopping? N N  Preparing Food and eating ? N N  Using the Toilet? N N  In the past six months, have you accidently leaked urine? N Y  Do you have problems with loss of bowel control? N N  Managing your Medications? N N  Managing your Finances? N N  Housekeeping or managing your Housekeeping? N N    Fall/Depression Screening: PHQ 2/9 Scores 08/19/2015 07/24/2015 06/25/2015 06/06/2015 05/09/2015 04/24/2015 03/27/2015  PHQ - 2 Score 0 0 0 0 0 0 0    Assessment: Patient continues to benefit from health coach outreach for disease management.    Plan[facility]  [facility]        Most Recent Value   Care Plan Problem One  Knowledge deficit related to self-management of diabetes.   Role Documenting the Problem One  Health Coach   Care Plan for Problem One  Active   THN Long Term Goal (31-90 days)  Patient will decrease A1c by 0.2 points within 90 days   THN Long Term Goal Start Date  08/19/15   Interventions for Problem One Long Term Goal  RN Health coach  discussed A1c goals with patient and reinforced diet control.       RN Health Coach will contact patient within one month and patient agrees to next outreach.    Bary Lericheionne J Hulda Reddix, RN, MSN [facility] Care  Management RN Telephonic Health Coach [phone]

## 2015-09-15 ENCOUNTER — Other Ambulatory Visit: Payer: Self-pay

## 2015-09-15 NOTE — Patient Outreach (Signed)
[facility]) Care Management  [facility] Care Manager  09/15/2015   [name] 1941/05/19 [id number]  Subjective: Telephone call to patient for monthly outreach. Patient reports she is doing good.  She reports she will see her primary doctor in May.  She reports highest blood sugar was 127.  Discussed goal of lowering A1c and continuing diet.  She verbalized understanding.  No concerns.   Objective:   Encounter Medications:  Outpatient Encounter Prescriptions as of 09/15/2015  Medication Sig Note  . allopurinol (ZYLOPRIM) 100 MG tablet Take 100 mg by mouth daily.   Marland Kitchen amLODipine (NORVASC) 10 MG tablet Take 10 mg by mouth daily. 10/04/2014: Patient states she takes 1/2 tablet of  Norvasc daily  . benzonatate (TESSALON) 100 MG capsule Take 2 capsules (200 mg total) by mouth 2 (two) times daily as needed for cough.   . chlorthalidone (HYGROTON) 25 MG tablet Take 12.5 mg by mouth daily. 05/18/2014: Received from: External Pharmacy Received Sig:   . losartan (COZAAR) 100 MG tablet Take 100 mg by mouth daily.   . metFORMIN (GLUCOPHAGE) 1000 MG tablet Take 1,000 mg by mouth 2 (two) times daily with a meal. 07/24/2015: Patient reports taking 1 tablet twice a day.   . naproxen (NAPROSYN) 500 MG tablet Take 500 mg by mouth 2 (two) times daily. 10/04/2014: Patient states she is taking  (1tablet twice daily only as needed  . Phenylephrine-APAP-Guaifenesin (MUCINEX SINUS-MAX PRESS & PAIN) 5-325-200 MG TABS Take 2 tablets by mouth 2 (two) times daily. Reported on 06/25/2015 10/25/2014: Patient states not taking because she has no sinus pressure or pain  . predniSONE (DELTASONE) 20 MG tablet Take 1 tablet (20 mg total) by mouth 2 (two) times daily.   . simvastatin (ZOCOR) 40 MG tablet Take 40 mg by mouth every evening.   Marland Kitchen HYDROcodone-acetaminophen (NORCO/VICODIN) 5-325 MG per tablet Take one-two tabs po q 4-6 hrs prn pain (Patient not taking: Reported on 09/15/2015)    No  facility-administered encounter medications on file as of 09/15/2015.    Functional Status:  In your present state of health, do you have any difficulty performing the following activities: 01/28/2015 11/20/2014  Hearing? N N  Vision? N N  Difficulty concentrating or making decisions? N N  Walking or climbing stairs? N N  Dressing or bathing? N N  Doing errands, shopping? N N  Preparing Food and eating ? N N  Using the Toilet? N N  In the past six months, have you accidently leaked urine? N Y  Do you have problems with loss of bowel control? N N  Managing your Medications? N N  Managing your Finances? N N  Housekeeping or managing your Housekeeping? N N    Fall/Depression Screening: PHQ 2/9 Scores 09/15/2015 08/19/2015 07/24/2015 06/25/2015 06/06/2015 05/09/2015 04/24/2015  PHQ - 2 Score 0 0 0 0 0 0 0    Assessment: Patient continues to benefit from health coach outreach for disease management.    Plan:  [facility] CM Care Plan Problem One        Most Recent Value   Care Plan Problem One  Knowledge deficit related to self-management of diabetes.   Role Documenting the Problem One  Health Coach   Care Plan for Problem One  Active   THN Long Term Goal (31-90 days)  Patient will decrease A1c by 0.2 points within 90 days   THN Long Term Goal Start Date  08/19/15   Interventions for Problem One Long Term Goal  RN Health coach  reviewed A1c goals with patient and reinforced diet control.       RN Health Coach will contact patient within one month and patient agrees to next outreach.    Bary Lericheionne J Sonnet Rizor, RN, MSN [facility] Care Management RN Telephonic Health Coach [phone]

## 2015-10-10 ENCOUNTER — Other Ambulatory Visit: Payer: Self-pay

## 2015-10-10 NOTE — Patient Outreach (Signed)
[facility]) Care Management  [facility] Care Manager  10/10/2015   [name] [birth year] [id number]  Subjective: Telephone call to patient for monthly call. Patient reports she is doing good and her highest morning blood sugar was 106.  Patient reports continuing to watch her diet and remain active.  Discussed upcoming appointment with primary doctor and A1c.  She verbalized understanding.  Discussed with patient reaching out to her every other month as she is maintaining and doing well.  Patient is receptive to every other month call.    Objective:   Encounter Medications:  Outpatient Encounter Prescriptions as of 10/10/2015  Medication Sig Note  . allopurinol (ZYLOPRIM) 100 MG tablet Take 100 mg by mouth daily.   Marland Kitchen amLODipine (NORVASC) 10 MG tablet Take 10 mg by mouth daily. 10/04/2014: Patient states she takes 1/2 tablet of  Norvasc daily  . benzonatate (TESSALON) 100 MG capsule Take 2 capsules (200 mg total) by mouth 2 (two) times daily as needed for cough.   . chlorthalidone (HYGROTON) 25 MG tablet Take 12.5 mg by mouth daily. 05/18/2014: Received from: External Pharmacy Received Sig:   . losartan (COZAAR) 100 MG tablet Take 100 mg by mouth daily.   . metFORMIN (GLUCOPHAGE) 1000 MG tablet Take 1,000 mg by mouth 2 (two) times daily with a meal. 07/24/2015: Patient reports taking 1 tablet twice a day.   . naproxen (NAPROSYN) 500 MG tablet Take 500 mg by mouth 2 (two) times daily. 10/04/2014: Patient states she is taking  (1tablet twice daily only as needed  . simvastatin (ZOCOR) 40 MG tablet Take 40 mg by mouth every evening.   Marland Kitchen HYDROcodone-acetaminophen (NORCO/VICODIN) 5-325 MG per tablet Take one-two tabs po q 4-6 hrs prn pain (Patient not taking: Reported on 09/15/2015)   . Phenylephrine-APAP-Guaifenesin (MUCINEX SINUS-MAX PRESS & PAIN) 5-325-200 MG TABS Take 2 tablets by mouth 2 (two) times daily. Reported on 10/10/2015 10/25/2014: Patient states not taking because she  has no sinus pressure or pain  . predniSONE (DELTASONE) 20 MG tablet Take 1 tablet (20 mg total) by mouth 2 (two) times daily. (Patient not taking: Reported on 10/10/2015)    No facility-administered encounter medications on file as of 10/10/2015.    Functional Status:  In your present state of health, do you have any difficulty performing the following activities: 01/28/2015 11/20/2014  Hearing? N N  Vision? N N  Difficulty concentrating or making decisions? N N  Walking or climbing stairs? N N  Dressing or bathing? N N  Doing errands, shopping? N N  Preparing Food and eating ? N N  Using the Toilet? N N  In the past six months, have you accidently leaked urine? N Y  Do you have problems with loss of bowel control? N N  Managing your Medications? N N  Managing your Finances? N N  Housekeeping or managing your Housekeeping? N N    Fall/Depression Screening: PHQ 2/9 Scores 10/10/2015 09/15/2015 08/19/2015 07/24/2015 06/25/2015 06/06/2015 05/09/2015  PHQ - 2 Score 0 0 0 0 0 0 0    Assessment: Patient continues to benefit from health coach outreach for disease management and support.     Plan:  [facility] CM Care Plan Problem One        Most Recent Value   Care Plan Problem One  Knowledge deficit related to self-management of diabetes.   Role Documenting the Problem One  Health Coach   Care Plan for Problem One  Active   [facility] Long  Term Goal (31-90 days)  Patient will decrease A1c by 0.2 points within 90 days   THN Long Term Goal Start Date  08/19/15 [name][goal continued]   Interventions for Problem One Long Term Goal  RN Health coach  discussed A1c goals with patient and reinforced diet control.       RN Health Coach will begin to reach out to patient every other month for disease management. RN Health Coach will contact patient during the month of July and patient agrees to next outreach.  Melanie Lericheionne J Keven Soucy, RN, MSN [facility] Care Management RN Telephonic Health Coach [phone]

## 2015-10-31 DIAGNOSIS — E782 Mixed hyperlipidemia: Secondary | ICD-10-CM | POA: Diagnosis not present

## 2015-10-31 DIAGNOSIS — E1165 Type 2 diabetes mellitus with hyperglycemia: Secondary | ICD-10-CM | POA: Diagnosis not present

## 2015-10-31 DIAGNOSIS — D509 Iron deficiency anemia, unspecified: Secondary | ICD-10-CM | POA: Diagnosis not present

## 2015-11-05 DIAGNOSIS — D509 Iron deficiency anemia, unspecified: Secondary | ICD-10-CM | POA: Diagnosis not present

## 2015-11-05 DIAGNOSIS — I10 Essential (primary) hypertension: Secondary | ICD-10-CM | POA: Diagnosis not present

## 2015-11-05 DIAGNOSIS — E782 Mixed hyperlipidemia: Secondary | ICD-10-CM | POA: Diagnosis not present

## 2015-11-05 DIAGNOSIS — E1165 Type 2 diabetes mellitus with hyperglycemia: Secondary | ICD-10-CM | POA: Diagnosis not present

## 2015-11-27 DIAGNOSIS — M1712 Unilateral primary osteoarthritis, left knee: Secondary | ICD-10-CM | POA: Diagnosis not present

## 2015-11-27 DIAGNOSIS — M545 Low back pain: Secondary | ICD-10-CM | POA: Diagnosis not present

## 2015-12-01 ENCOUNTER — Ambulatory Visit: Payer: Commercial Managed Care - HMO

## 2015-12-08 ENCOUNTER — Ambulatory Visit: Payer: Commercial Managed Care - HMO

## 2015-12-10 ENCOUNTER — Other Ambulatory Visit: Payer: Self-pay

## 2015-12-10 NOTE — Patient Outreach (Signed)
[name]) Care Management  [name]  12/10/2015   [name] 1940/10/22 [id number]  Subjective: Telephone call to patient for every other month call. Patient reports she is doing good. She reports she had to see the orthopedic doctor for her knee and she was given an injection and she feels much better and is able to do her normal routine. Patient reports A1c on last visit was 6.4.  Discussed with patient diet and maintaining A1c levels. She verbalized understanding.    Objective:   Encounter Medications:  Outpatient Encounter Prescriptions as of 12/10/2015  Medication Sig Note  . allopurinol (ZYLOPRIM) 100 MG tablet Take 100 mg by mouth daily.   Marland Kitchen amLODipine (NORVASC) 10 MG tablet Take 10 mg by mouth daily. 10/04/2014: Patient states she takes 1/2 tablet of 51m Norvasc daily  . benzonatate (TESSALON) 100 MG capsule Take 2 capsules (200 mg total) by mouth 2 (two) times daily as needed for cough.   . chlorthalidone (HYGROTON) 25 MG tablet Take 12.5 mg by mouth daily. 05/18/2014: Received from: External Pharmacy Received Sig:   . losartan (COZAAR) 100 MG tablet Take 100 mg by mouth daily.   . metFORMIN (GLUCOPHAGE) 1000 MG tablet Take 1,000 mg by mouth 2 (two) times daily with a meal. 07/24/2015: Patient reports taking 1 tablet twice a day.   . naproxen (NAPROSYN) 500 MG tablet Take 500 mg by mouth 2 (two) times daily. 10/04/2014: Patient states she is taking 5037m(1tablet twice daily only as needed  . Phenylephrine-APAP-Guaifenesin (MUCINEX SINUS-MAX PRESS & PAIN) 5-325-200 MG TABS Take 2 tablets by mouth 2 (two) times daily. Reported on 10/10/2015 10/25/2014: Patient states not taking because she has no sinus pressure or pain  . simvastatin (ZOCOR) 40 MG tablet Take 40 mg by mouth every evening.   . Marland KitchenYDROcodone-acetaminophen (NORCO/VICODIN) 5-325 MG per tablet Take one-two tabs po q 4-6 hrs prn pain (Patient not taking: Reported on 12/10/2015)   . predniSONE  (DELTASONE) 20 MG tablet Take 1 tablet (20 mg total) by mouth 2 (two) times daily. (Patient not taking: Reported on 10/10/2015)    No facility-administered encounter medications on file as of 12/10/2015.    Functional Status:  In your present state of health, do you have any difficulty performing the following activities: 12/10/2015 01/28/2015  Hearing? N N  Vision? N N  Difficulty concentrating or making decisions? N N  Walking or climbing stairs? N N  Dressing or bathing? N N  Doing errands, shopping? N N  Preparing Food and eating ? N N  Using the Toilet? N N  In the past six months, have you accidently leaked urine? N N  Do you have problems with loss of bowel control? N N  Managing your Medications? N N  Managing your Finances? - N  Housekeeping or managing your Housekeeping? N N    Fall/Depression Screening: PHQ 2/9 Scores 12/10/2015 10/10/2015 09/15/2015 08/19/2015 07/24/2015 06/25/2015 06/06/2015  PHQ - 2 Score 0 0 0 0 0 0 0    Assessment: Patient continues to benefit from health coach outreach for disease management and support.    Plan:  [facility] Care Plan Problem One        Most Recent Value   Care Plan Problem One  Knowledge deficit related to self-management of diabetes.   Role Documenting the Problem One  [place] Problem One  Active   THN Long Term Goal (31-90 days)  Patient will  keep A1c less than 6.5 within 90 days   THN Long Term Goal Start Date  12/10/15   [facility] Long Term Goal Met Date  12/10/15   Interventions for Problem One Long Term Goal  RN Health Coach discussed with patient how diet and exercise contribute to keeping blood sugar and A1c under control.       RN Health Coach will contact patient in the month of September and patient agrees to next outreach.  Jone Baseman, RN, MSN [place] [phone]

## 2016-02-10 ENCOUNTER — Other Ambulatory Visit: Payer: Self-pay

## 2016-02-10 NOTE — Patient Outreach (Signed)
[facility](THN) Care Management  02/10/2016  Melanie BeamBessie Y Black 1940-08-29 [id number]   Telephone call to patient for every other month call. Female answers stating patient is not in. HIPAA compliant voice message left.   Plan: RN Health Coach will attempt patient again in the month of September.    Bary Lericheionne J [name], RN, MSN [facility] Care Management RN Telephonic Health Coach [phone]

## 2016-02-16 ENCOUNTER — Other Ambulatory Visit: Payer: Self-pay

## 2016-02-16 NOTE — Patient Outreach (Addendum)
[facility]) Care Management  [facility] Care [facility]  02/16/2016   [name] 02-Nov-1940 [id number]  Subjective: Telephone call to patient for every other month call patient reports she is doing pretty good. She reports that her knee has started to give her some problems but will discuss with her physician as they discussed before the possibility of an MRI next.  Patient reports her blood sugar this morning was 102 and that her sugars have been overall good.  Discussed continued management of diabetes.  No concerns.    Objective:   Encounter Medications:  Outpatient Encounter Prescriptions as of 02/16/2016  Medication Sig Note  . allopurinol (ZYLOPRIM) 100 MG tablet Take 100 mg by mouth daily.   Marland Kitchen amLODipine (NORVASC) 10 MG tablet Take 10 mg by mouth daily. 10/04/2014: Patient states she takes 1/2 tablet of 10mg  Norvasc daily  . benzonatate (TESSALON) 100 MG capsule Take 2 capsules (200 mg total) by mouth 2 (two) times daily as needed for cough.   . chlorthalidone (HYGROTON) 25 MG tablet Take 12.5 mg by mouth daily. 05/18/2014: Received from: External Pharmacy Received Sig:   . losartan (COZAAR) 100 MG tablet Take 100 mg by mouth daily.   . metFORMIN (GLUCOPHAGE) 1000 MG tablet Take 1,000 mg by mouth 2 (two) times daily with a meal. 07/24/2015: Patient reports taking 1 tablet twice a day.   . naproxen (NAPROSYN) 500 MG tablet Take 500 mg by mouth 2 (two) times daily. 10/04/2014: Patient states she is taking 500mg  (1tablet twice daily only as needed  . Phenylephrine-APAP-Guaifenesin (MUCINEX SINUS-MAX PRESS & PAIN) 5-325-200 MG TABS Take 2 tablets by mouth 2 (two) times daily. Reported on 10/10/2015 10/25/2014: Patient states not taking because she has no sinus pressure or pain  . simvastatin (ZOCOR) 40 MG tablet Take 40 mg by mouth every evening.   Marland Kitchen HYDROcodone-acetaminophen (NORCO/VICODIN) 5-325 MG per tablet Take one-two tabs po q 4-6 hrs prn pain (Patient not taking: Reported on  12/10/2015)   . predniSONE (DELTASONE) 20 MG tablet Take 1 tablet (20 mg total) by mouth 2 (two) times daily. (Patient not taking: Reported on 02/16/2016)    No facility-administered encounter medications on file as of 02/16/2016.     Functional Status:  In your present state of health, do you have any difficulty performing the following activities: 12/10/2015  Hearing? N  Vision? N  Difficulty concentrating or making decisions? N  Walking or climbing stairs? N  Dressing or bathing? N  Doing errands, shopping? N  Preparing Food and eating ? N  Using the Toilet? N  In the past six months, have you accidently leaked urine? N  Do you have problems with loss of bowel control? N  Managing your Medications? N  Housekeeping or managing your Housekeeping? N  Some recent data might be hidden    Fall/Depression Screening: PHQ 2/9 Scores 02/16/2016 12/10/2015 10/10/2015 09/15/2015 08/19/2015 07/24/2015 06/25/2015  PHQ - 2 Score 0 0 0 0 0 0 0    Assessment: Patient continues to benefit from health coach outreach for disease management and support.   Plan:  [facility] CM Care Plan Problem One   Flowsheet Row Most Recent Value  Care Plan Problem One  Knowledge deficit related to self-management of diabetes.  Role Documenting the Problem One  Health Coach  Care Plan for Problem One  Active  THN Long Term Goal (31-90 days)  Patient will keep A1c less than 6.5 within 90 days  THN Long Term Goal Start  Date  02/16/16  Interventions for Problem One Long Term Goal  RN Health Coach reiterated with patient how diet and exercise contribute to keeping blood sugar and A1c under control.       RN Health Coach will contact patient in the month of November and patient agrees to next outreach.  Bary Lericheionne J Andriy Sherk, RN, MSN [facility] Care Management RN Telephonic Health Coach [phone]

## 2016-02-20 ENCOUNTER — Other Ambulatory Visit: Payer: Self-pay | Admitting: Pharmacist

## 2016-02-20 NOTE — Patient Outreach (Signed)
Outreach call to [name] regarding her [facility] for [facility]. Left a HIPAA compliant message for the patient.  Duanne MoronElisabeth Dakotah Heiman, PharmD Clinical Pharmacist [facility] Care Management [phone]

## 2016-03-15 DIAGNOSIS — M1712 Unilateral primary osteoarthritis, left knee: Secondary | ICD-10-CM | POA: Diagnosis not present

## 2016-03-22 DIAGNOSIS — M545 Low back pain: Secondary | ICD-10-CM | POA: Diagnosis not present

## 2016-03-25 DIAGNOSIS — E119 Type 2 diabetes mellitus without complications: Secondary | ICD-10-CM | POA: Diagnosis not present

## 2016-03-25 DIAGNOSIS — E782 Mixed hyperlipidemia: Secondary | ICD-10-CM | POA: Diagnosis not present

## 2016-03-25 DIAGNOSIS — M545 Low back pain: Secondary | ICD-10-CM | POA: Diagnosis not present

## 2016-03-25 DIAGNOSIS — I10 Essential (primary) hypertension: Secondary | ICD-10-CM | POA: Diagnosis not present

## 2016-03-30 DIAGNOSIS — Z23 Encounter for immunization: Secondary | ICD-10-CM | POA: Diagnosis not present

## 2016-03-30 DIAGNOSIS — M25562 Pain in left knee: Secondary | ICD-10-CM | POA: Diagnosis not present

## 2016-03-30 DIAGNOSIS — E790 Hyperuricemia without signs of inflammatory arthritis and tophaceous disease: Secondary | ICD-10-CM | POA: Diagnosis not present

## 2016-03-30 DIAGNOSIS — D649 Anemia, unspecified: Secondary | ICD-10-CM | POA: Diagnosis not present

## 2016-03-30 DIAGNOSIS — R944 Abnormal results of kidney function studies: Secondary | ICD-10-CM | POA: Diagnosis not present

## 2016-03-30 DIAGNOSIS — E782 Mixed hyperlipidemia: Secondary | ICD-10-CM | POA: Diagnosis not present

## 2016-03-30 DIAGNOSIS — E1165 Type 2 diabetes mellitus with hyperglycemia: Secondary | ICD-10-CM | POA: Diagnosis not present

## 2016-03-30 DIAGNOSIS — Z0001 Encounter for general adult medical examination with abnormal findings: Secondary | ICD-10-CM | POA: Diagnosis not present

## 2016-03-30 DIAGNOSIS — Z6837 Body mass index (BMI) 37.0-37.9, adult: Secondary | ICD-10-CM | POA: Diagnosis not present

## 2016-03-30 DIAGNOSIS — I10 Essential (primary) hypertension: Secondary | ICD-10-CM | POA: Diagnosis not present

## 2016-03-31 DIAGNOSIS — Z961 Presence of intraocular lens: Secondary | ICD-10-CM | POA: Diagnosis not present

## 2016-03-31 DIAGNOSIS — H52201 Unspecified astigmatism, right eye: Secondary | ICD-10-CM | POA: Diagnosis not present

## 2016-03-31 DIAGNOSIS — H5203 Hypermetropia, bilateral: Secondary | ICD-10-CM | POA: Diagnosis not present

## 2016-03-31 DIAGNOSIS — H524 Presbyopia: Secondary | ICD-10-CM | POA: Diagnosis not present

## 2016-03-31 DIAGNOSIS — E119 Type 2 diabetes mellitus without complications: Secondary | ICD-10-CM | POA: Diagnosis not present

## 2016-04-05 DIAGNOSIS — M545 Low back pain: Secondary | ICD-10-CM | POA: Diagnosis not present

## 2016-04-05 DIAGNOSIS — M5116 Intervertebral disc disorders with radiculopathy, lumbar region: Secondary | ICD-10-CM | POA: Diagnosis not present

## 2016-04-13 ENCOUNTER — Other Ambulatory Visit: Payer: Self-pay

## 2016-04-13 NOTE — Patient Outreach (Signed)
[facility](THN) Care Management  04/13/2016  Doreen BeamBessie Y Couchman 05/27/1941 [id number]   Telephone call to patient for every other month call.  Female states patient not in. Health Coach contact information given.    Plan: RN Health Coach will attempt patient again in the month of November.   Bary Lericheionne J Niv Darley, RN, MSN [facility] Care Management RN Telephonic Health Coach [phone]

## 2016-04-14 DIAGNOSIS — M5116 Intervertebral disc disorders with radiculopathy, lumbar region: Secondary | ICD-10-CM | POA: Diagnosis not present

## 2016-04-14 DIAGNOSIS — M545 Low back pain: Secondary | ICD-10-CM | POA: Diagnosis not present

## 2016-05-03 ENCOUNTER — Other Ambulatory Visit: Payer: Self-pay

## 2016-05-03 DIAGNOSIS — E119 Type 2 diabetes mellitus without complications: Secondary | ICD-10-CM | POA: Insufficient documentation

## 2016-05-03 NOTE — Patient Outreach (Addendum)
[facility]) Care Management  [facility]  05/03/2016   [name] 10-09-1940 [id number]  Subjective: Telephone call to patient for every other month call. Patient reports she is doing ok.  She reports some stiffness in her knees and that she sees the orthopedic doctor this week.  Patient reports she has no pain just the stiffness after she has sat for a while.  Patient reports seeing her primary doctor last month and A1c was 6.5.  Discussed with patient continuing diet in order to control sugars.   Objective:   Encounter Medications:  Outpatient Encounter Prescriptions as of 05/03/2016  Medication Sig Note  . allopurinol (ZYLOPRIM) 100 MG tablet Take 100 mg by mouth daily.   Marland Kitchen. amLODipine (NORVASC) 10 MG tablet Take 10 mg by mouth daily. 10/04/2014: Patient states she takes 1/2 tablet of 10mg  Norvasc daily  . benzonatate (TESSALON) 100 MG capsule Take 2 capsules (200 mg total) by mouth 2 (two) times daily as needed for cough.   . chlorthalidone (HYGROTON) 25 MG tablet Take 12.5 mg by mouth daily. 05/18/2014: Received from: External Pharmacy Received Sig:   . losartan (COZAAR) 100 MG tablet Take 100 mg by mouth daily.   . metFORMIN (GLUCOPHAGE) 1000 MG tablet Take 1,000 mg by mouth 2 (two) times daily with a meal. 07/24/2015: Patient reports taking 1 tablet twice a day.   . naproxen (NAPROSYN) 500 MG tablet Take 500 mg by mouth 2 (two) times daily. 10/04/2014: Patient states she is taking 500mg  (1tablet twice daily only as needed  . Phenylephrine-APAP-Guaifenesin (MUCINEX SINUS-MAX PRESS & PAIN) 5-325-200 MG TABS Take 2 tablets by mouth 2 (two) times daily. Reported on 10/10/2015 10/25/2014: Patient states not taking because she has no sinus pressure or pain  . predniSONE (DELTASONE) 20 MG tablet Take 1 tablet (20 mg total) by mouth 2 (two) times daily.   . simvastatin (ZOCOR) 40 MG tablet Take 40 mg by mouth every evening.   Marland Kitchen. HYDROcodone-acetaminophen (NORCO/VICODIN)  5-325 MG per tablet Take one-two tabs po q 4-6 hrs prn pain (Patient not taking: Reported on 05/03/2016)    No facility-administered encounter medications on file as of 05/03/2016.     Functional Status:  In your present state of health, do you have any difficulty performing the following activities: 12/10/2015  Hearing? N  Vision? N  Difficulty concentrating or making decisions? N  Walking or climbing stairs? N  Dressing or bathing? N  Doing errands, shopping? N  Preparing Food and eating ? N  Using the Toilet? N  In the past six months, have you accidently leaked urine? N  Do you have problems with loss of bowel control? N  Managing your Medications? N  Housekeeping or managing your Housekeeping? N  Some recent data might be hidden    Fall/Depression Screening: PHQ 2/9 Scores 05/03/2016 02/16/2016 12/10/2015 10/10/2015 09/15/2015 08/19/2015 07/24/2015  PHQ - 2 Score 0 0 0 0 0 0 0    Assessment: Patient continues to benefit from health coach outreach for disease management and support.    Plan:  [facility] Problem [facility]   Flowsheet Row Most Recent Value  Care Plan Problem One  Knowledge deficit related to self-management of diabetes.  Role Documenting the Problem One  Health Coach  Care Plan for Problem One  Active  THN Long Term Goal (31-90 days)  Patient will keep A1c less than 6.5 within 90 days  THN Long Term Goal Start Date  05/03/16  Interventions for Problem One Long Term Goal  RN Health Coach reviewed with patient how diet and exercise contribute to keeping blood sugar and A1c under control.       RN Health Coach will contact patient in the month of January and patient agrees to next outreach.  Bary Lericheionne J Kynesha Guerin, RN, MSN [facility] Care Management RN Telephonic Health Coach [phone]

## 2016-05-04 DIAGNOSIS — M5116 Intervertebral disc disorders with radiculopathy, lumbar region: Secondary | ICD-10-CM | POA: Diagnosis not present

## 2016-05-04 DIAGNOSIS — M545 Low back pain: Secondary | ICD-10-CM | POA: Diagnosis not present

## 2016-05-06 DIAGNOSIS — M170 Bilateral primary osteoarthritis of knee: Secondary | ICD-10-CM | POA: Diagnosis not present

## 2016-05-13 DIAGNOSIS — M170 Bilateral primary osteoarthritis of knee: Secondary | ICD-10-CM | POA: Diagnosis not present

## 2016-05-20 DIAGNOSIS — M170 Bilateral primary osteoarthritis of knee: Secondary | ICD-10-CM | POA: Diagnosis not present

## 2016-06-07 DIAGNOSIS — J189 Pneumonia, unspecified organism: Secondary | ICD-10-CM

## 2016-06-07 HISTORY — DX: Pneumonia, unspecified organism: J18.9

## 2016-06-14 ENCOUNTER — Other Ambulatory Visit: Payer: Self-pay

## 2016-06-14 NOTE — Patient Outreach (Signed)
[facility](THN) Care Management  [facility]  06/14/2016   Melanie BeamBessie Y Black 1940-08-30 [id number]  Subjective: Telephone call to patient for every other month call.  Patient reports she is doing good and that her sugars are staying under control. Reeducated patient on maintaining diabetic diet. She verbalized understanding.     Objective:   Encounter Medications:  Outpatient Encounter Prescriptions as of 06/14/2016  Medication Sig Note  . allopurinol (ZYLOPRIM) 100 MG tablet Take 100 mg by mouth daily.   [name]. amLODipine (NORVASC) 10 MG tablet Take 10 mg by mouth daily. 10/04/2014: Patient states she takes 1/2 tablet of 10mg  Norvasc daily  . chlorthalidone (HYGROTON) 25 MG tablet Take 12.5 mg by mouth daily. 05/18/2014: Received from: External Pharmacy Received Sig:   . losartan (COZAAR) 100 MG tablet Take 100 mg by mouth daily.   . metFORMIN (GLUCOPHAGE) 1000 MG tablet Take 1,000 mg by mouth 2 (two) times daily with a meal. 07/24/2015: Patient reports taking 1 tablet twice a day.   . naproxen (NAPROSYN) 500 MG tablet Take 500 mg by mouth 2 (two) times daily. 10/04/2014: Patient states she is taking 500mg  (1tablet twice daily only as needed  . Phenylephrine-APAP-Guaifenesin (MUCINEX SINUS-MAX PRESS & PAIN) 5-325-200 MG TABS Take 2 tablets by mouth 2 (two) times daily. Reported on 10/10/2015 10/25/2014: Patient states not taking because she has no sinus pressure or pain  . predniSONE (DELTASONE) 20 MG tablet Take 1 tablet (20 mg total) by mouth 2 (two) times daily.   . simvastatin (ZOCOR) 40 MG tablet Take 40 mg by mouth every evening.   . benzonatate (TESSALON) 100 MG capsule Take 2 capsules (200 mg total) by mouth 2 (two) times daily as needed for cough.   [name]. HYDROcodone-acetaminophen (NORCO/VICODIN) 5-325 MG per tablet Take one-two tabs po q 4-6 hrs prn pain (Patient not taking: Reported on 06/14/2016)    No facility-administered encounter medications on file as of 06/14/2016.      Functional Status:  In your present state of health, do you have any difficulty performing the following activities: 12/10/2015  Hearing? N  Vision? N  Difficulty concentrating or making decisions? N  Walking or climbing stairs? N  Dressing or bathing? N  Doing errands, shopping? N  Preparing Food and eating ? N  Using the Toilet? N  In the past six months, have you accidently leaked urine? N  Do you have problems with loss of bowel control? N  Managing your Medications? N  Housekeeping or managing your Housekeeping? N  Some recent data might be hidden    Fall/Depression Screening: PHQ 2/9 Scores 06/14/2016 05/03/2016 02/16/2016 12/10/2015 10/10/2015 09/15/2015 08/19/2015  PHQ - 2 Score 0 0 0 0 0 0 0    Assessment: Patient continues to benefit from health coach outreach for disease management and support.    Plan:  [facility] Care [facility] Problem [facility]   Flowsheet Row Most Recent Value  Care Plan Problem One  Knowledge deficit related to self-management of diabetes.  Role Documenting the Problem One  Health Coach  Care Plan for Problem One  Active  THN Long Term Goal (31-90 days)  Patient will keep A1c less than 6.5 within 90 days  THN Long Term Goal Start Date  06/14/16  Interventions for Problem One Long Term Goal  RN Health Coach reeducated with patient how diet and exercise contribute to keeping blood sugar and A1c under control.       RN Health Coach will contact  patient in the month of March and patient agrees to next outreach.  Bary Leriche, RN, MSN [facility] Care Management RN Telephonic Health Coach [phone]

## 2016-08-06 ENCOUNTER — Other Ambulatory Visit: Payer: Self-pay

## 2016-08-06 NOTE — Patient Outreach (Signed)
[facility](THN) Care Management  08/06/2016  Doreen BeamBessie Y Anastas 02/07/1941 [id number]   Telephone call to patient for every other month call.  Female answers stating patient not in.  Advised health coach would call another time.    Plan: RN Health Coach will attempt patient again in the month of March.  Bary Lericheionne J Flonnie Wierman, RN, MSN [facility] Care Management RN Telephonic Health Coach [phone]

## 2016-08-19 DIAGNOSIS — M170 Bilateral primary osteoarthritis of knee: Secondary | ICD-10-CM | POA: Diagnosis not present

## 2016-08-20 ENCOUNTER — Other Ambulatory Visit: Payer: Self-pay

## 2016-08-20 DIAGNOSIS — E1165 Type 2 diabetes mellitus with hyperglycemia: Secondary | ICD-10-CM | POA: Diagnosis not present

## 2016-08-20 DIAGNOSIS — D509 Iron deficiency anemia, unspecified: Secondary | ICD-10-CM | POA: Diagnosis not present

## 2016-08-20 DIAGNOSIS — I10 Essential (primary) hypertension: Secondary | ICD-10-CM | POA: Diagnosis not present

## 2016-08-20 NOTE — Patient Outreach (Addendum)
[facility]) Care Management  [facility] Care Manager  08/20/2016   [name] [birth year] [id number]  Subjective: Telephone call to patient for every other month call.  Spoke with patient she is able to verify HIPAA.  Patient reports she had been having some trouble with her knees and that she received a cortisone injection on yesterday.  Patient reports that it has helped her pain as she does not have any pain this AM.  Patient however reports that her sugar yesterday shot up in the 200's yesterday.  Discussed with patient how steroids affect sugars and to check her sugars. Also discussed with patient being cautious with her diet. She verbalized understanding. Reassured patient that her sugars would begin to trend down after a few days.  She verbalized understanding.   Objective:   Encounter Medications:  Outpatient Encounter Prescriptions as of 08/20/2016  Medication Sig Note  . allopurinol (ZYLOPRIM) 100 MG tablet Take 100 mg by mouth daily.   Marland Kitchen amLODipine (NORVASC) 10 MG tablet Take 10 mg by mouth daily. 10/04/2014: Patient states she takes 1/2 tablet of 10mg  Norvasc daily  . chlorthalidone (HYGROTON) 25 MG tablet Take 12.5 mg by mouth daily. 05/18/2014: Received from: External Pharmacy Received Sig:   . losartan (COZAAR) 100 MG tablet Take 100 mg by mouth daily.   . metFORMIN (GLUCOPHAGE) 1000 MG tablet Take 1,000 mg by mouth 2 (two) times daily with a meal. 07/24/2015: Patient reports taking 1 tablet twice a day.   . naproxen (NAPROSYN) 500 MG tablet Take 500 mg by mouth 2 (two) times daily. 10/04/2014: Patient states she is taking 500mg  (1tablet twice daily only as needed  . simvastatin (ZOCOR) 40 MG tablet Take 40 mg by mouth every evening.   . benzonatate (TESSALON) 100 MG capsule Take 2 capsules (200 mg total) by mouth 2 (two) times daily as needed for cough. (Patient not taking: Reported on 08/20/2016)   . HYDROcodone-acetaminophen (NORCO/VICODIN) 5-325 MG per tablet Take  one-two tabs po q 4-6 hrs prn pain (Patient not taking: Reported on 06/14/2016)   . Phenylephrine-APAP-Guaifenesin (MUCINEX SINUS-MAX PRESS & PAIN) 5-325-200 MG TABS Take 2 tablets by mouth 2 (two) times daily. Reported on 10/10/2015 10/25/2014: Patient states not taking because she has no sinus pressure or pain  . predniSONE (DELTASONE) 20 MG tablet Take 1 tablet (20 mg total) by mouth 2 (two) times daily. (Patient not taking: Reported on 08/20/2016)    No facility-administered encounter medications on file as of 08/20/2016.     Functional Status:  In your present state of health, do you have any difficulty performing the following activities: 12/10/2015  Hearing? N  Vision? N  Difficulty concentrating or making decisions? N  Walking or climbing stairs? N  Dressing or bathing? N  Doing errands, shopping? N  Preparing Food and eating ? N  Using the Toilet? N  In the past six months, have you accidently leaked urine? N  Do you have problems with loss of bowel control? N  Managing your Medications? N  Housekeeping or managing your Housekeeping? N  Some recent data might be hidden    Fall/Depression Screening: PHQ 2/9 Scores 08/20/2016 06/14/2016 05/03/2016 02/16/2016 12/10/2015 10/10/2015 09/15/2015  PHQ - 2 Score 0 0 0 0 0 0 0    Assessment: Patient continues to benefit from health coach outreach for disease management and support.    Plan:  [facility] CM Care Plan Problem One     Most Recent Value  Care Plan Problem  One  Knowledge deficit related to self-management of diabetes.  Role Documenting the Problem One  Health Coach  Care Plan for Problem One  Active  THN Long Term Goal (31-90 days)  Patient will keep A1c less than 6.5 within 90 days  THN Long Term Goal Start Date  08/20/16 [goal continued]  Interventions for Problem One Long Term Goal  RN Health Coach reinforced with patient how diet and exercise contribute to keeping blood sugar and A1c under control.       RN Health Coach will contact  patient in the month of May and patient agrees to next outreach.  Bary Lericheionne J Rosamaria Donn, RN, MSN [facility] Care Management RN Telephonic Health Coach [phone]

## 2016-08-24 DIAGNOSIS — R350 Frequency of micturition: Secondary | ICD-10-CM | POA: Diagnosis not present

## 2016-08-24 DIAGNOSIS — M25562 Pain in left knee: Secondary | ICD-10-CM | POA: Diagnosis not present

## 2016-08-24 DIAGNOSIS — Z6837 Body mass index (BMI) 37.0-37.9, adult: Secondary | ICD-10-CM | POA: Diagnosis not present

## 2016-08-24 DIAGNOSIS — Z23 Encounter for immunization: Secondary | ICD-10-CM | POA: Diagnosis not present

## 2016-08-24 DIAGNOSIS — R944 Abnormal results of kidney function studies: Secondary | ICD-10-CM | POA: Diagnosis not present

## 2016-08-24 DIAGNOSIS — I10 Essential (primary) hypertension: Secondary | ICD-10-CM | POA: Diagnosis not present

## 2016-08-24 DIAGNOSIS — E782 Mixed hyperlipidemia: Secondary | ICD-10-CM | POA: Diagnosis not present

## 2016-08-24 DIAGNOSIS — E1165 Type 2 diabetes mellitus with hyperglycemia: Secondary | ICD-10-CM | POA: Diagnosis not present

## 2016-08-24 DIAGNOSIS — E790 Hyperuricemia without signs of inflammatory arthritis and tophaceous disease: Secondary | ICD-10-CM | POA: Diagnosis not present

## 2016-09-11 ENCOUNTER — Encounter (HOSPITAL_COMMUNITY): Payer: Self-pay | Admitting: Emergency Medicine

## 2016-09-11 ENCOUNTER — Observation Stay (HOSPITAL_COMMUNITY)
Admission: EM | Admit: 2016-09-11 | Discharge: 2016-09-12 | Disposition: A | Discharge status: Home / Self Care | Payer: Medicare HMO | Attending: Internal Medicine | Admitting: Internal Medicine

## 2016-09-11 ENCOUNTER — Emergency Department (HOSPITAL_COMMUNITY): Payer: Medicare HMO

## 2016-09-11 DIAGNOSIS — I700 Atherosclerosis of aorta: Secondary | ICD-10-CM

## 2016-09-11 DIAGNOSIS — E119 Type 2 diabetes mellitus without complications: Secondary | ICD-10-CM | POA: Insufficient documentation

## 2016-09-11 DIAGNOSIS — Z79899 Other long term (current) drug therapy: Secondary | ICD-10-CM | POA: Diagnosis not present

## 2016-09-11 DIAGNOSIS — I10 Essential (primary) hypertension: Secondary | ICD-10-CM | POA: Insufficient documentation

## 2016-09-11 DIAGNOSIS — R079 Chest pain, unspecified: Secondary | ICD-10-CM | POA: Diagnosis not present

## 2016-09-11 DIAGNOSIS — R072 Precordial pain: Secondary | ICD-10-CM | POA: Diagnosis not present

## 2016-09-11 DIAGNOSIS — E1169 Type 2 diabetes mellitus with other specified complication: Secondary | ICD-10-CM | POA: Diagnosis present

## 2016-09-11 DIAGNOSIS — E785 Hyperlipidemia, unspecified: Secondary | ICD-10-CM

## 2016-09-11 DIAGNOSIS — Z7984 Long term (current) use of oral hypoglycemic drugs: Secondary | ICD-10-CM | POA: Insufficient documentation

## 2016-09-11 HISTORY — DX: Atherosclerosis of aorta: I70.0

## 2016-09-11 LAB — COMPREHENSIVE METABOLIC PANEL
ALK PHOS: 87 U/L (ref 38–126)
ALT: 17 U/L (ref 14–54)
ANION GAP: 11 (ref 5–15)
AST: 19 U/L (ref 15–41)
Albumin: 3.8 g/dL (ref 3.5–5.0)
BUN: 19 mg/dL (ref 6–20)
CO2: 28 mmol/L (ref 22–32)
Calcium: 9.5 mg/dL (ref 8.9–10.3)
Chloride: 104 mmol/L (ref 101–111)
Creatinine, Ser: 1.19 mg/dL — ABNORMAL HIGH (ref 0.44–1.00)
GFR calc non Af Amer: 44 mL/min — ABNORMAL LOW (ref >60)
GFR, EST AFRICAN AMERICAN: 50 mL/min — AB (ref >60)
Glucose, Bld: 94 mg/dL (ref 65–99)
Potassium: 3.2 mmol/L — ABNORMAL LOW (ref 3.5–5.1)
SODIUM: 143 mmol/L (ref 135–145)
Total Bilirubin: 0.5 mg/dL (ref 0.3–1.2)
Total Protein: 7.4 g/dL (ref 6.5–8.1)

## 2016-09-11 LAB — TROPONIN I: Troponin I: <0.03 ng/mL (ref <0.03)

## 2016-09-11 LAB — GLUCOSE, CAPILLARY
GLUCOSE-CAPILLARY: 103 mg/dL — AB (ref 65–99)
Glucose-Capillary: 85 mg/dL (ref 65–99)

## 2016-09-11 LAB — CBC
HCT: 39.2 % (ref 36.0–46.0)
HEMOGLOBIN: 12.6 g/dL (ref 12.0–15.0)
MCH: 29.5 pg (ref 26.0–34.0)
MCHC: 32.1 g/dL (ref 30.0–36.0)
MCV: 91.8 fL (ref 78.0–100.0)
PLATELETS: 225 10*3/uL (ref 150–400)
RBC: 4.27 MIL/uL (ref 3.87–5.11)
RDW: 15.1 % (ref 11.5–15.5)
WBC: 8.9 10*3/uL (ref 4.0–10.5)

## 2016-09-11 MED ORDER — ENOXAPARIN SODIUM 40 MG/0.4ML ~~LOC~~ SOLN
40.0000 mg | SUBCUTANEOUS | Status: DC
  Administered 2016-09-11: 40 mg via SUBCUTANEOUS
  Filled 2016-09-11: qty 0.4

## 2016-09-11 MED ORDER — ONDANSETRON HCL 4 MG/2ML IJ SOLN: 4.0000 mg | Freq: Four times a day (QID) | INTRAMUSCULAR | Status: DC | PRN

## 2016-09-11 MED ORDER — LOSARTAN POTASSIUM 50 MG PO TABS
100.0000 mg | ORAL_TABLET | Freq: Every day | ORAL | Status: DC
  Administered 2016-09-12: 100 mg via ORAL
  Filled 2016-09-11: qty 2

## 2016-09-11 MED ORDER — NITROGLYCERIN 2 % TD OINT
0.5000 [in_us] | TOPICAL_OINTMENT | Freq: Once | TRANSDERMAL | Status: AC
  Administered 2016-09-11: 0.5 [in_us] via TOPICAL
  Filled 2016-09-11: qty 1

## 2016-09-11 MED ORDER — GI COCKTAIL ~~LOC~~: 30.0000 mL | Freq: Four times a day (QID) | ORAL | Status: DC | PRN

## 2016-09-11 MED ORDER — INSULIN ASPART 100 UNIT/ML ~~LOC~~ SOLN: 0.0000 [IU] | Freq: Every day | SUBCUTANEOUS | Status: DC

## 2016-09-11 MED ORDER — CHLORTHALIDONE 25 MG PO TABS
12.5000 mg | ORAL_TABLET | Freq: Every day | ORAL | Status: DC
  Administered 2016-09-12: 12.5 mg via ORAL
  Filled 2016-09-11 (×2): qty 0.5

## 2016-09-11 MED ORDER — ALLOPURINOL 100 MG PO TABS
100.0000 mg | ORAL_TABLET | Freq: Every day | ORAL | Status: DC
  Administered 2016-09-12: 100 mg via ORAL
  Filled 2016-09-11: qty 1

## 2016-09-11 MED ORDER — SIMVASTATIN 20 MG PO TABS
40.0000 mg | ORAL_TABLET | Freq: Every evening | ORAL | Status: DC
  Administered 2016-09-11: 40 mg via ORAL
  Filled 2016-09-11: qty 2

## 2016-09-11 MED ORDER — ACETAMINOPHEN 325 MG PO TABS
650.0000 mg | ORAL_TABLET | ORAL | Status: DC | PRN
  Administered 2016-09-11 – 2016-09-12 (×2): 650 mg via ORAL
  Filled 2016-09-11 (×2): qty 2

## 2016-09-11 MED ORDER — INSULIN ASPART 100 UNIT/ML ~~LOC~~ SOLN: 0.0000 [IU] | Freq: Three times a day (TID) | SUBCUTANEOUS | Status: DC

## 2016-09-11 MED ORDER — ASPIRIN 81 MG PO CHEW
324.0000 mg | CHEWABLE_TABLET | Freq: Once | ORAL | Status: AC
  Administered 2016-09-11: 324 mg via ORAL
  Filled 2016-09-11: qty 4

## 2016-09-11 MED ORDER — PANTOPRAZOLE SODIUM 40 MG PO TBEC
40.0000 mg | DELAYED_RELEASE_TABLET | Freq: Every day | ORAL | Status: DC
  Administered 2016-09-11 – 2016-09-12 (×2): 40 mg via ORAL
  Filled 2016-09-11 (×2): qty 1

## 2016-09-11 MED ORDER — AMLODIPINE BESYLATE 5 MG PO TABS
5.0000 mg | ORAL_TABLET | Freq: Every day | ORAL | Status: DC
  Administered 2016-09-12: 5 mg via ORAL
  Filled 2016-09-11: qty 1

## 2016-09-11 MED ORDER — POTASSIUM CHLORIDE CRYS ER 20 MEQ PO TBCR
20.0000 meq | EXTENDED_RELEASE_TABLET | Freq: Once | ORAL | Status: AC
  Administered 2016-09-11: 20 meq via ORAL
  Filled 2016-09-11: qty 1

## 2016-09-11 MED ORDER — INSULIN ASPART 100 UNIT/ML ~~LOC~~ SOLN: 4.0000 [IU] | Freq: Three times a day (TID) | SUBCUTANEOUS | Status: DC

## 2016-09-11 NOTE — H&P (Signed)
History and Physical    [name]:[id number] DOB: 09-10-1940 DOA: 09/11/2016  Referring MD/NP/PA: Eber Hong, EDP PCP: Dwana Melena, MD  Patient coming from: Home  Chief Complaint: Chest pain  HPI: [name] is a 76 y.o. female with h/o HTN, HLD, DM II, never smoker, comes to the hospital today with a 4 day h/o CP. She describes more as pain around her epigastric area, worse with eating. Not associated with exertion or position changes. Not worsened by deep breathing. No SOB, dizziness or palpitations. Trop negative, EKG without acute changes. Given HEART score of 4 admission for cardiac rule out has been requested.  Past Medical/Surgical History: Past Medical History:  Diagnosis Date  . Diabetes mellitus without complication (HCC)   . Gout   . Hypercholesteremia   . Hypertension     Past Surgical History:  Procedure Laterality Date  . ABDOMINAL HYSTERECTOMY      Social History:  reports that she has never smoked. She has never used smokeless tobacco. She reports that she does not drink alcohol or use drugs.  Allergies: No Known Allergies  Family History:  No h/o of MI or CVA in family members  Prior to Admission medications   Medication Sig Start Date End Date Taking? Authorizing Provider  allopurinol (ZYLOPRIM) 100 MG tablet Take 100 mg by mouth daily.   Yes Historical Provider, MD  amLODipine (NORVASC) 10 MG tablet Take 5 mg by mouth daily.    Yes Historical Provider, MD  chlorthalidone (HYGROTON) 25 MG tablet Take 12.5 mg by mouth daily. 05/17/14  Yes Historical Provider, MD  losartan (COZAAR) 100 MG tablet Take 100 mg by mouth daily.   Yes Historical Provider, MD  metFORMIN (GLUCOPHAGE) 1000 MG tablet Take 1,000 mg by mouth 2 (two) times daily with a meal.   Yes Historical Provider, MD  naproxen (NAPROSYN) 500 MG tablet Take 500 mg by mouth daily as needed for moderate pain.  04/24/14  Yes Historical Provider, MD  simvastatin (ZOCOR) 40 MG tablet Take 40  mg by mouth every evening.   Yes Historical Provider, MD    Review of Systems:  Constitutional: Denies fever, chills, diaphoresis, appetite change and fatigue.  HEENT: Denies photophobia, eye pain, redness, hearing loss, ear pain, congestion, sore throat, rhinorrhea, sneezing, mouth sores, trouble swallowing, neck pain, neck stiffness and tinnitus.   Respiratory: Denies SOB, DOE, cough, chest tightness,  and wheezing.   Cardiovascular: Denies chest pain, palpitations and leg swelling.  Gastrointestinal: Denies nausea, vomiting, abdominal pain, diarrhea, constipation, blood in stool and abdominal distention.  Genitourinary: Denies dysuria, urgency, frequency, hematuria, flank pain and difficulty urinating.  Endocrine: Denies: hot or cold intolerance, sweats, changes in hair or nails, polyuria, polydipsia. Musculoskeletal: Denies myalgias, back pain, joint swelling, arthralgias and gait problem.  Skin: Denies pallor, rash and wound.  Neurological: Denies dizziness, seizures, syncope, weakness, light-headedness, numbness and headaches.  Hematological: Denies adenopathy. Easy bruising, personal or family bleeding history  Psychiatric/Behavioral: Denies suicidal ideation, mood changes, confusion, nervousness, sleep disturbance and agitation    Physical Exam: Vitals:   09/11/16 1417 09/11/16 1422 09/11/16 1430 09/11/16 1516  BP: (!) 160/90  125/82 134/83  Pulse:  80 86 82  Resp:  Temp:    98.2 F (36.8 C)  TempSrc:    Oral  SpO2:  95% 95% 96%  Weight:    90.9 kg (200 lb 4.8 oz)  Height:     (1.575 m)  Constitutional: NAD, calm, comfortable Eyes: PERRL, lids and conjunctivae normal ENMT: Mucous membranes are moist. Posterior pharynx clear of any exudate or lesions.Normal dentition.  Neck: normal, supple, no masses, no thyromegaly Respiratory: clear to auscultation bilaterally, no wheezing, no crackles. Normal respiratory effort. No accessory muscle use.    Cardiovascular: Regular rate and rhythm, no murmurs / rubs / gallops. No extremity edema. 2+ pedal pulses. No carotid bruits.  Abdomen: no tenderness, no masses palpated. No hepatosplenomegaly. Bowel sounds positive.  Musculoskeletal: no clubbing / cyanosis. No joint deformity upper and lower extremities. Good ROM, no contractures. Normal muscle tone.  Skin: no rashes, lesions, ulcers. No induration Neurologic: CN 2-12 grossly intact. Sensation intact, DTR normal. Strength 5/5 in all 4.  Psychiatric: Normal judgment and insight. Alert and oriented x 3. Normal mood.    Labs on Admission: I have personally reviewed the following labs and imaging studies  CBC:  Recent Labs Lab 09/11/16 1206  WBC 8.9  HGB 12.6  HCT 39.2  MCV 91.8  PLT 225   Basic Metabolic Panel:  Recent Labs Lab 09/11/16 1206  NA 143  K 3.2*  CL 104  CO2 28  GLUCOSE 94  BUN 19  CREATININE 1.19*  CALCIUM 9.5   GFR: Estimated Creatinine Clearance: 42.8 mL/min (A) (by C-G formula based on SCr of 1.19 mg/dL (H)). Liver Function Tests:  Recent Labs Lab 09/11/16 1206  AST 19  ALT 17  ALKPHOS 87  BILITOT 0.5  PROT 7.4  ALBUMIN 3.8   No results for input(s): LIPASE, AMYLASE in the last 168 hours. No results for input(s): AMMONIA in the last 168 hours. Coagulation Profile: No results for input(s): INR, PROTIME in the last 168 hours. Cardiac Enzymes:  Recent Labs Lab 09/11/16 1206  TROPONINI <0.03   BNP (last 3 results) No results for input(s): PROBNP in the last 8760 hours. HbA1C: No results for input(s): HGBA1C in the last 72 hours. CBG: No results for input(s): GLUCAP in the last 168 hours. Lipid Profile: No results for input(s): CHOL, HDL, LDLCALC, TRIG, CHOLHDL, LDLDIRECT in the last 72 hours. Thyroid Function Tests: No results for input(s): TSH, T4TOTAL, FREET4, T3FREE, THYROIDAB in the last 72 hours. Anemia Panel: No results for input(s): VITAMINB12, FOLATE, FERRITIN, TIBC, IRON,  RETICCTPCT in the last 72 hours. Urine analysis:    Component Value Date/Time   COLORURINE yellow 10/17/2008 1020   APPEARANCEUR Clear 10/17/2008 1020   LABSPEC 1.015 10/17/2008 1020   PHURINE 5.5 10/17/2008 1020   HGBUR negative 10/17/2008 1020   BILIRUBINUR negative 10/17/2008 1020   UROBILINOGEN 1.0 10/17/2008 1020   NITRITE negative 10/17/2008 1020   Sepsis Labs: (procalcitonin:4,lacticidven:4) )No results found for this or any previous visit (from the past 240 hour(s)).   Radiological Exams on Admission: Dg Chest 2 View  Result Date: 09/11/2016 CLINICAL DATA:  75 year old female with a history of chest pain for 4 days EXAM: CHEST  2 VIEW COMPARISON:  04/02/2012 FINDINGS: Cardiomediastinal silhouette unchanged with tortuosity of descending thoracic aorta. Calcifications of the thoracic aorta. Low lung volumes. No confluent airspace disease pneumothorax or pleural effusion. No displaced fracture IMPRESSION: No radiographic evidence of acute cardiopulmonary disease. Aortic atherosclerosis Electronically Signed   By: Gilmer Mor D.O.   On: 09/11/2016 12:51    EKG: Independently reviewed. No acute changes  Assessment/Plan Principal Problem:   Precordial chest pain Active Problems:   Type 2 diabetes mellitus with hyperlipidemia [facility])   Essential hypertension    Chest Pain -Very atypical for  cardiac origin; sounds more GI in nature. -Start PPI daily. -Admit to telemetry, obs, cycle troponins, repeat EKG in am. -Check ECHO  HTN -Fair control. -Continue home medications  HLD -Continue statin  DM II -Check AIC, hold metformin, place on SSI.   DVT prophylaxis: lovenox  Code Status: full code  Family Communication: husband and sister at bedside  Disposition Plan: anticipate DC home in 24 hours  Consults called: none  Admission status: observation    Time Spent: 80 minutes  Chaya Jan MD [facility] Pager [phone]  If 7PM-7AM,  please contact night-coverage [email or web address] Password [email or web address]  09/11/2016, 4:12 PM

## 2016-09-11 NOTE — ED Notes (Signed)
Pt ambulated to BR with her cane from home without any difficultly

## 2016-09-11 NOTE — ED Triage Notes (Signed)
Pt reports epigastric/chest pain for 4 days.  Describes as feeling of indigestion with no radiation or associated symptoms.

## 2016-09-11 NOTE — ED Notes (Signed)
Pt did have some pain in chest with sitting all way up to take po med

## 2016-09-11 NOTE — ED Provider Notes (Signed)
[facility]-EMERGENCY DEPT Provider Note   CSN: [id number] Arrival date & time: 09/11/16  1152    By signing my name below, I, Clarisse Gouge, attest that this documentation has been prepared under the direction and in the presence of Eber Hong, MD. Electronically signed, Clarisse Gouge, ED Scribe. 09/11/16. 12:30 PM.   History   Chief Complaint Chief Complaint  Patient presents with  . Chest Pain   The history is provided by the patient and medical records. No language interpreter was used.    HPI Comments: [name] is a 76 y.o. female with Hx of HTN, gout, HLD and DM who presents to the Emergency Department complaining of progressive, exertional chest pain onset 4 days ago. Pt describes mild to moderate, "tight" chest pain that is improved with rest and worsened with movment. She notes associated belching more than usual and ankle swelling. She relates increased belching to the alka seltzer she has taken for her chest tightness. She states she has taken Advil and alka seltzer with mild to moderate relief to her chest pain. Pt taking allopurinol at home for gout; she states she has not taken ASA at home. Pt reports her mother had a pacemaker placed, began dialysis treatments soon after, and then died a year later. Pt deneis SOB, cough, fever, diarrhea and Hx of heart attacks, arrhythmia and blood clots.  Past Medical History:  Diagnosis Date  . Diabetes mellitus without complication (HCC)   . Gout   . Hypercholesteremia   . Hypertension     Patient Active Problem List   Diagnosis Date Noted  . Precordial chest pain 09/11/2016  . Diabetes (HCC) 05/03/2016  . HYPOKALEMIA 11/01/2008  . HYPERGLYCEMIA, FASTING 11/01/2008  . HYPERLIPIDEMIA 10/17/2008  . GOUT, UNSPECIFIED 10/17/2008  . OBESITY 10/17/2008  . MIGRAINE HEADACHE 10/17/2008  . HYPERTENSION 10/17/2008  . INTERNAL HEMORRHOIDS 10/17/2008  . DIVERTICULOSIS, COLON 10/17/2008  . ARTHRITIS 10/17/2008    Past Surgical  History:  Procedure Laterality Date  . ABDOMINAL HYSTERECTOMY      OB History    No data available       Home Medications    Prior to Admission medications   Medication Sig Start Date End Date Taking? Authorizing Provider  allopurinol (ZYLOPRIM) 100 MG tablet Take 100 mg by mouth daily.    Historical Provider, MD  amLODipine (NORVASC) 10 MG tablet Take 10 mg by mouth daily.    Historical Provider, MD  benzonatate (TESSALON) 100 MG capsule Take 2 capsules (200 mg total) by mouth 2 (two) times daily as needed for cough. Patient not taking: Reported on 08/20/2016 04/02/12   Mancel Bale, MD  chlorthalidone (HYGROTON) 25 MG tablet Take 12.5 mg by mouth daily. 05/17/14   Historical Provider, MD  HYDROcodone-acetaminophen (NORCO/VICODIN) 5-325 MG per tablet Take one-two tabs po q 4-6 hrs prn pain Patient not taking: Reported on 06/14/2016 05/18/14   Tammy Triplett, PA-C  losartan (COZAAR) 100 MG tablet Take 100 mg by mouth daily.    Historical Provider, MD  metFORMIN (GLUCOPHAGE) 1000 MG tablet Take 1,000 mg by mouth 2 (two) times daily with a meal.    Historical Provider, MD  naproxen (NAPROSYN) 500 MG tablet Take 500 mg by mouth 2 (two) times daily. 04/24/14   Historical Provider, MD  Phenylephrine-APAP-Guaifenesin (MUCINEX SINUS-MAX PRESS & PAIN) 5-325-200 MG TABS Take 2 tablets by mouth 2 (two) times daily. Reported on 10/10/2015    Historical Provider, MD  predniSONE (DELTASONE) 20 MG tablet Take 1 tablet (20  mg total) by mouth 2 (two) times daily. Patient not taking: Reported on 08/20/2016 04/02/12   Mancel Bale, MD  simvastatin (ZOCOR) 40 MG tablet Take 40 mg by mouth every evening.    Historical Provider, MD    Family History History reviewed. No pertinent family history.  Social History Social History  Substance Use Topics  . Smoking status: Never Smoker  . Smokeless tobacco: Not on file  . Alcohol use No     Allergies   Patient has no known allergies.   Review of  Systems Review of Systems  Respiratory: Positive for chest tightness.   Cardiovascular: Positive for chest pain and leg swelling.  All other systems reviewed and are negative.    Physical Exam Updated Vital Signs BP (!) 154/87 (BP Location: Left Arm)   Pulse 84   Temp 97.8 F (36.6 C)   Resp 18   Ht  (1.575 m)   Wt 190 lb (86.2 kg)   SpO2 96%   BMI 34.75 kg/m   Physical Exam  Constitutional: She appears well-developed and well-nourished. No distress.  HENT:  Head: Normocephalic and atraumatic.  Mouth/Throat: Oropharynx is clear and moist. No oropharyngeal exudate.  Eyes: Conjunctivae and EOM are normal. Pupils are equal, round, and reactive to light. Right eye exhibits no discharge. Left eye exhibits no discharge. No scleral icterus.  Neck: Normal range of motion. Neck supple. No JVD present. No thyromegaly present.  Cardiovascular: Normal rate, normal heart sounds and intact distal pulses.  Exam reveals no gallop and no friction rub.   No murmur heard. 1+ pititng edema symmetrical in the ankles; PVC's every 4th beat.  Pulmonary/Chest: Effort normal and breath sounds normal. No respiratory distress. She has no wheezes. She has no rales.  Abdominal: Soft. Bowel sounds are normal. She exhibits no distension and no mass. There is no tenderness.  Musculoskeletal: Normal range of motion. She exhibits no edema or tenderness.  Lymphadenopathy:    She has no cervical adenopathy.  Neurological: She is alert. Coordination normal.  Skin: Skin is warm and dry. No rash noted. No erythema.  Psychiatric: She has a normal mood and affect. Her behavior is normal.  Nursing note and vitals reviewed.    ED Treatments / Results  DIAGNOSTIC STUDIES: Oxygen Saturation is 96% on RA, normal by my interpretation.    COORDINATION OF CARE: 12:21 PM Discussed treatment plan with pt at bedside and pt agreed to plan. Will order labs and imaging.  Labs (all labs ordered are listed, but only  abnormal results are displayed) Labs Reviewed  COMPREHENSIVE METABOLIC PANEL - Abnormal; Notable for the following:       Result Value   Potassium 3.2 (*)    Creatinine, Ser 1.19 (*)    GFR calc non Af Amer 44 (*)    GFR calc Af Amer 50 (*)    All other components within normal limits  CBC  TROPONIN I    EKG  EKG Interpretation  Date/Time:  Saturday September 11 2016 12:04:40 EDT Ventricular Rate:  82 PR Interval:    QRS Duration: 91 QT Interval:  385 QTC Calculation: 450 R Axis:   6 Text Interpretation:  Sinus rhythm Multiform ventricular premature complexes Low voltage, precordial leads Left ventricular hypertrophy No old tracing to compare Abnormal ekg Confirmed by Hyacinth Meeker  MD, Mysha Peeler ([id number]) on 09/11/2016 1:02:23 PM       Radiology Dg Chest 2 View  Result Date: 09/11/2016 CLINICAL DATA:  76 year old female with a  history of chest pain for 4 days EXAM: CHEST  2 VIEW COMPARISON:  04/02/2012 FINDINGS: Cardiomediastinal silhouette unchanged with tortuosity of descending thoracic aorta. Calcifications of the thoracic aorta. Low lung volumes. No confluent airspace disease pneumothorax or pleural effusion. No displaced fracture IMPRESSION: No radiographic evidence of acute cardiopulmonary disease. Aortic atherosclerosis Electronically Signed   By: Gilmer Mor D.O.   On: 09/11/2016 12:51    Procedures Procedures (including critical care time)  Medications Ordered in ED Medications  aspirin chewable tablet 324 mg (324 mg Oral Given 09/11/16 1242)  nitroGLYCERIN (NITROGLYN) 2 % ointment 0.5 inch (0.5 inches Topical Given 09/11/16 1242)  potassium chloride SA (K-DUR,KLOR-CON) CR tablet 20 mEq (20 mEq Oral Given 09/11/16 1316)     Initial Impression / Assessment and Plan / ED Course  I have reviewed the triage vital signs and the nursing notes.  Pertinent labs & imaging results that were available during my care of the patient were reviewed by me and considered in my medical decision  making (see chart for details).     There is significant concern for the source of her chest pain being a cardiac source. Thankfully troponin is normal at this time, will discussed with hospitalist, appreciate Dr. Ardyth Harps who will admit the patient.  Aspirin given  Nitroglycerin paste placed on the patient  Final Clinical Impressions(s) / ED Diagnoses   Final diagnoses:  Precordial pain    New Prescriptions New Prescriptions   No medications on file   I personally performed the services described in this documentation, which was scribed in my presence. The recorded information has been reviewed and is accurate.        Eber Hong, MD 09/11/16 1320

## 2016-09-11 NOTE — ED Notes (Signed)
Pt only experiences pain to chest with movement, pt ambulated to BR again.  Denies CP with rest.

## 2016-09-12 ENCOUNTER — Observation Stay (HOSPITAL_COMMUNITY): Payer: Medicare HMO

## 2016-09-12 DIAGNOSIS — R079 Chest pain, unspecified: Secondary | ICD-10-CM | POA: Diagnosis not present

## 2016-09-12 DIAGNOSIS — R072 Precordial pain: Secondary | ICD-10-CM | POA: Diagnosis not present

## 2016-09-12 DIAGNOSIS — I10 Essential (primary) hypertension: Secondary | ICD-10-CM | POA: Diagnosis not present

## 2016-09-12 DIAGNOSIS — I5189 Other ill-defined heart diseases: Secondary | ICD-10-CM

## 2016-09-12 HISTORY — DX: Other ill-defined heart diseases: I51.89

## 2016-09-12 LAB — ECHOCARDIOGRAM COMPLETE
CHL CUP DOP CALC LVOT VTI: 25.6 cm
CHL CUP MV DEC (S): 275
CHL CUP TV REG PEAK VELOCITY: 235 cm/s
E decel time: 275 msec
EERAT: 7.3
FS: 40 % (ref 28–44)
Height: 62 in
IV/PV OW: 1.05
LA diam index: 1.77 cm/m2
LASIZE: 36 mm
LAVOL: 42.1 mL
LAVOLA4C: 48 mL
LAVOLIN: 20.7 mL/m2
LDCA: 2.54 cm2
LEFT ATRIUM END SYS DIAM: 36 mm
LV PW d: 11.1 mm — AB (ref 0.6–1.1)
LV dias vol index: 24 mL/m2
LV sys vol: 19 mL (ref 14–42)
LVDIAVOL: 49 mL (ref 46–106)
LVEEAVG: 7.3
LVEEMED: 7.3
LVELAT: 10.3 cm/s
LVOT peak grad rest: 6 mmHg
LVOT peak vel: 118 cm/s
LVOTD: 18 mm
LVOTSV: 65 mL
LVSYSVOLIN: 9 mL/m2
Lateral S' vel: 13.1 cm/s
MV pk A vel: 117 m/s
MV pk E vel: 75.2 m/s
MVPG: 2 mmHg
RV TAPSE: 20 mm
RV sys press: 25 mmHg
Simpson's disk: 62
Stroke v: 31 ml
TDI e' lateral: 10.3
TDI e' medial: 5.98
TR max vel: 235 cm/s
Weight: 3204.8 oz

## 2016-09-12 LAB — TROPONIN I

## 2016-09-12 LAB — HEMOGLOBIN A1C
HEMOGLOBIN A1C: 6.6 % — AB (ref 4.8–5.6)
Mean Plasma Glucose: 143 mg/dL

## 2016-09-12 LAB — GLUCOSE, CAPILLARY
Glucose-Capillary: 85 mg/dL (ref 65–99)
Glucose-Capillary: 107 mg/dL — ABNORMAL HIGH (ref 65–99)

## 2016-09-12 MED ORDER — PANTOPRAZOLE SODIUM 40 MG PO TBEC: 40.0000 mg | DELAYED_RELEASE_TABLET | Freq: Every day | ORAL | 2 refills | Status: DC

## 2016-09-12 NOTE — Progress Notes (Signed)
AVS reviewed with patient.  Verbalized understanding of discharge instructions, physician follow-up, medications.  Patient's IV removed.  Site WNL.  Prescription given to patient.  Patient transported by wheelchair to main entrance for discharge.  Patient stable at time of discharge.

## 2016-09-12 NOTE — Progress Notes (Signed)
*  PRELIMINARY RESULTS* Echocardiogram 2D Echocardiogram has been performed.  [name] 09/12/2016, 11:18 AM

## 2016-09-12 NOTE — Discharge Summary (Signed)
Physician Discharge Summary  [name] ZOX:[id number] DOB: [birth year] DOA: 09/11/2016  PCP: Dwana Melena, MD  Admit date: 09/11/2016 Discharge date: 09/12/2016  Time spent: 45 minutes  Recommendations for Outpatient Follow-up:  -Will be discharged home today. -Advised to follow up with PCP in 2 weeks.   Discharge Diagnoses:  Principal Problem:   Precordial chest pain Active Problems:   Type 2 diabetes mellitus with hyperlipidemia [facility])   Essential hypertension   Discharge Condition: Stable and improved  Filed Weights   09/11/16 1206 09/11/16 1516  Weight: 86.2 kg (190 lb) 90.9 kg (200 lb 4.8 oz)    History of present illness:  [name] is a 76 y.o. female with h/o HTN, HLD, DM II, never smoker, comes to the hospital today with a 4 day h/o CP. She describes more as pain around her epigastric area, worse with eating. Not associated with exertion or position changes. Not worsened by deep breathing. No SOB, dizziness or palpitations. Trop negative, EKG without acute changes. Given HEART score of 4 admission for cardiac rule out has been requested.  Hospital Course:   Chest Pain -Ruled out for ACS. Very atypical in nature. -Suspect GI based on characteristics. -Has been started on daily PPI. -Since she does have risk factors, she has been advised to follow up with cardiology as an OP if her CP recurs despite PPI treatment.  Rest of chronic conditions have been stable this hospitalization  Procedures: Echo:  Left ventricle: The cavity size was normal. There was mild   concentric hypertrophy. Systolic function was normal. The   estimated ejection fraction was in the range of 55% to 60%. Wall   motion was normal; there were no regional wall motion   abnormalities. Doppler parameters are consistent with abnormal    left ventricular relaxation (grade 1 diastolic dysfunction).  Consultations:  None  Discharge Instructions  Discharge Instructions    Diet - low  sodium heart healthy    Complete by:  As directed    Increase activity slowly    Complete by:  As directed      Allergies as of 09/12/2016   No Known Allergies     Medication List    STOP taking these medications   naproxen 500 MG tablet Commonly known as:  NAPROSYN     TAKE these medications   allopurinol 100 MG tablet Commonly known as:  ZYLOPRIM Take 100 mg by mouth daily.   amLODipine 10 MG tablet Commonly known as:  NORVASC Take 5 mg by mouth daily.   chlorthalidone 25 MG tablet Commonly known as:  HYGROTON Take 12.5 mg by mouth daily.   losartan 100 MG tablet Commonly known as:  COZAAR Take 100 mg by mouth daily.   metFORMIN 1000 MG tablet Commonly known as:  GLUCOPHAGE Take 1,000 mg by mouth 2 (two) times daily with a meal.   pantoprazole 40 MG tablet Commonly known as:  PROTONIX Take 1 tablet (40 mg total) by mouth daily. Start taking on:  09/13/2016   simvastatin 40 MG tablet Commonly known as:  ZOCOR Take 40 mg by mouth every evening.      No Known Allergies Follow-up Information    Dwana Melena, MD. Schedule an appointment as soon as possible for a visit in 2 week(s).   Specialty:  Internal Medicine Contact information: [address] [place] [address] [phone]            The results of significant diagnostics from  this hospitalization (including imaging, microbiology, ancillary and laboratory) are listed below for reference.    Significant Diagnostic Studies: Dg Chest 2 View  Result Date: 09/11/2016 CLINICAL DATA:  76 year old female with a history of chest pain for 4 days EXAM: CHEST  2 VIEW COMPARISON:  04/02/2012 FINDINGS: Cardiomediastinal silhouette unchanged with tortuosity of descending thoracic aorta. Calcifications of the thoracic aorta. Low lung volumes. No confluent airspace disease pneumothorax or pleural effusion. No displaced fracture IMPRESSION: No radiographic evidence of acute cardiopulmonary disease. Aortic  atherosclerosis Electronically Signed   By: Gilmer Mor D.O.   On: 09/11/2016 12:51    Microbiology: No results found for this or any previous visit (from the past 240 hour(s)).   Labs: Basic Metabolic Panel:  Recent Labs Lab 09/11/16 1206  NA 143  K 3.2*  CL 104  CO2 28  GLUCOSE 94  BUN 19  CREATININE 1.19*  CALCIUM 9.5   Liver Function Tests:  Recent Labs Lab 09/11/16 1206  AST 19  ALT 17  ALKPHOS 87  BILITOT 0.5  PROT 7.4  ALBUMIN 3.8   No results for input(s): LIPASE, AMYLASE in the last 168 hours. No results for input(s): AMMONIA in the last 168 hours. CBC:  Recent Labs Lab 09/11/16 1206  WBC 8.9  HGB 12.6  HCT 39.2  MCV 91.8  PLT 225   Cardiac Enzymes:  Recent Labs Lab 09/11/16 1206 09/11/16 1622 09/11/16 2146 09/12/16 0242  TROPONINI <0.03 <0.03 <0.03 <0.03   BNP: BNP (last 3 results) No results for input(s): BNP in the last 8760 hours.  ProBNP (last 3 results) No results for input(s): PROBNP in the last 8760 hours.  CBG:  Recent Labs Lab 09/11/16 1655 09/11/16 2027 09/12/16 0803 09/12/16 1156  GLUCAP 85 103* 85 107*       Signed:  HERNANDEZ ACOSTA,ESTELA  [facility] Pager: [phone] 09/12/2016, 4:41 PM

## 2016-09-13 ENCOUNTER — Other Ambulatory Visit: Payer: Self-pay

## 2016-09-13 NOTE — Patient Outreach (Signed)
[facility]) Care Management  09/13/2016  [name] 03-09-1941 [id number]   Telephone call to patient for ED/observation follow up.  Patient reports that she is doing ok this morning. She reports some mild balling up feeling in her stomach/chest area this morning but not like before she went to the hospital. Patient reports she was placed on Protonix as the question was if she had reflux.  She states that her husband will be going to pick up her medication this morning. Patient also reports that she will be calling her primary doctor Dr. Margo Aye this morning to make a follow up appointment. Advised patient to let them know that her appointment is a follow up from the hospital.  She verbalized understanding.  Advised patient on chest pain and to seek help immediately. She verbalized understanding.    Plan: RN Health Coach will contact patient in the month of May and patient agrees to next outreach.  Bary Leriche, RN, MSN [facility] Care Management RN Telephonic Health Coach [phone]

## 2016-09-20 DIAGNOSIS — Z6836 Body mass index (BMI) 36.0-36.9, adult: Secondary | ICD-10-CM | POA: Diagnosis not present

## 2016-09-20 DIAGNOSIS — R1013 Epigastric pain: Secondary | ICD-10-CM | POA: Diagnosis not present

## 2016-09-20 DIAGNOSIS — R079 Chest pain, unspecified: Secondary | ICD-10-CM | POA: Diagnosis not present

## 2016-10-06 ENCOUNTER — Encounter (HOSPITAL_COMMUNITY): Payer: Self-pay

## 2016-10-06 ENCOUNTER — Inpatient Hospital Stay (HOSPITAL_COMMUNITY)
Admission: EM | Admit: 2016-10-06 | Discharge: 2016-10-12 | DRG: 871 | Disposition: A | Discharge status: Home Health | Payer: Medicare HMO | Attending: Family Medicine | Admitting: Family Medicine

## 2016-10-06 ENCOUNTER — Emergency Department (HOSPITAL_COMMUNITY): Payer: Medicare HMO

## 2016-10-06 DIAGNOSIS — R5081 Fever presenting with conditions classified elsewhere: Secondary | ICD-10-CM | POA: Diagnosis not present

## 2016-10-06 DIAGNOSIS — R509 Fever, unspecified: Secondary | ICD-10-CM

## 2016-10-06 DIAGNOSIS — E785 Hyperlipidemia, unspecified: Secondary | ICD-10-CM | POA: Diagnosis present

## 2016-10-06 DIAGNOSIS — M47816 Spondylosis without myelopathy or radiculopathy, lumbar region: Secondary | ICD-10-CM | POA: Diagnosis present

## 2016-10-06 DIAGNOSIS — J181 Lobar pneumonia, unspecified organism: Secondary | ICD-10-CM | POA: Diagnosis not present

## 2016-10-06 DIAGNOSIS — Z794 Long term (current) use of insulin: Secondary | ICD-10-CM | POA: Diagnosis not present

## 2016-10-06 DIAGNOSIS — A4189 Other specified sepsis: Secondary | ICD-10-CM | POA: Diagnosis not present

## 2016-10-06 DIAGNOSIS — E669 Obesity, unspecified: Secondary | ICD-10-CM | POA: Diagnosis present

## 2016-10-06 DIAGNOSIS — M109 Gout, unspecified: Secondary | ICD-10-CM | POA: Diagnosis present

## 2016-10-06 DIAGNOSIS — Z79899 Other long term (current) drug therapy: Secondary | ICD-10-CM

## 2016-10-06 DIAGNOSIS — J90 Pleural effusion, not elsewhere classified: Secondary | ICD-10-CM | POA: Diagnosis not present

## 2016-10-06 DIAGNOSIS — J189 Pneumonia, unspecified organism: Secondary | ICD-10-CM | POA: Diagnosis not present

## 2016-10-06 DIAGNOSIS — K5790 Diverticulosis of intestine, part unspecified, without perforation or abscess without bleeding: Secondary | ICD-10-CM | POA: Diagnosis not present

## 2016-10-06 DIAGNOSIS — Z6834 Body mass index (BMI) 34.0-34.9, adult: Secondary | ICD-10-CM

## 2016-10-06 DIAGNOSIS — I700 Atherosclerosis of aorta: Secondary | ICD-10-CM | POA: Diagnosis present

## 2016-10-06 DIAGNOSIS — E119 Type 2 diabetes mellitus without complications: Secondary | ICD-10-CM

## 2016-10-06 DIAGNOSIS — E7800 Pure hypercholesterolemia, unspecified: Secondary | ICD-10-CM | POA: Diagnosis present

## 2016-10-06 DIAGNOSIS — B349 Viral infection, unspecified: Secondary | ICD-10-CM | POA: Diagnosis present

## 2016-10-06 DIAGNOSIS — M47814 Spondylosis without myelopathy or radiculopathy, thoracic region: Secondary | ICD-10-CM | POA: Diagnosis present

## 2016-10-06 DIAGNOSIS — R531 Weakness: Secondary | ICD-10-CM | POA: Diagnosis not present

## 2016-10-06 DIAGNOSIS — I10 Essential (primary) hypertension: Secondary | ICD-10-CM | POA: Diagnosis not present

## 2016-10-06 DIAGNOSIS — R05 Cough: Secondary | ICD-10-CM | POA: Diagnosis not present

## 2016-10-06 DIAGNOSIS — J13 Pneumonia due to Streptococcus pneumoniae: Secondary | ICD-10-CM | POA: Diagnosis not present

## 2016-10-06 LAB — URINALYSIS, ROUTINE W REFLEX MICROSCOPIC
BACTERIA UA: NONE SEEN
Bilirubin Urine: NEGATIVE
Glucose, UA: NEGATIVE mg/dL
Hgb urine dipstick: NEGATIVE
KETONES UR: NEGATIVE mg/dL
Leukocytes, UA: NEGATIVE
Nitrite: NEGATIVE
PH: 6 (ref 5.0–8.0)
PROTEIN: 100 mg/dL — AB
Specific Gravity, Urine: 1.021 (ref 1.005–1.030)

## 2016-10-06 LAB — COMPREHENSIVE METABOLIC PANEL
ALBUMIN: 3.4 g/dL — AB (ref 3.5–5.0)
ALK PHOS: 86 U/L (ref 38–126)
ALT: 13 U/L — AB (ref 14–54)
AST: 13 U/L — ABNORMAL LOW (ref 15–41)
Anion gap: 10 (ref 5–15)
BUN: 14 mg/dL (ref 6–20)
CALCIUM: 9.5 mg/dL (ref 8.9–10.3)
CHLORIDE: 93 mmol/L — AB (ref 101–111)
CO2: 30 mmol/L (ref 22–32)
CREATININE: 1.01 mg/dL — AB (ref 0.44–1.00)
GFR calc Af Amer: >60 mL/min (ref >60)
GFR calc non Af Amer: 53 mL/min — ABNORMAL LOW (ref >60)
GLUCOSE: 132 mg/dL — AB (ref 65–99)
Potassium: 3.4 mmol/L — ABNORMAL LOW (ref 3.5–5.1)
SODIUM: 133 mmol/L — AB (ref 135–145)
Total Bilirubin: 0.8 mg/dL (ref 0.3–1.2)
Total Protein: 6.9 g/dL (ref 6.5–8.1)

## 2016-10-06 LAB — CBC WITH DIFFERENTIAL/PLATELET
Basophils Absolute: 0 10*3/uL (ref 0.0–0.1)
Basophils Relative: 0 %
EOS PCT: 0 %
Eosinophils Absolute: 0 10*3/uL (ref 0.0–0.7)
HCT: 36.4 % (ref 36.0–46.0)
Hemoglobin: 12.1 g/dL (ref 12.0–15.0)
LYMPHS ABS: 1.3 10*3/uL (ref 0.7–4.0)
LYMPHS PCT: 9 %
MCH: 29.6 pg (ref 26.0–34.0)
MCHC: 33.2 g/dL (ref 30.0–36.0)
MCV: 89 fL (ref 78.0–100.0)
MONO ABS: 1.4 10*3/uL — AB (ref 0.1–1.0)
MONOS PCT: 10 %
Neutro Abs: 11.7 10*3/uL — ABNORMAL HIGH (ref 1.7–7.7)
Neutrophils Relative %: 81 %
PLATELETS: 222 10*3/uL (ref 150–400)
RBC: 4.09 MIL/uL (ref 3.87–5.11)
RDW: 14.8 % (ref 11.5–15.5)
WBC: 14.4 10*3/uL — AB (ref 4.0–10.5)

## 2016-10-06 LAB — INFLUENZA PANEL BY PCR (TYPE A & B)
INFLAPCR: NEGATIVE
INFLBPCR: NEGATIVE

## 2016-10-06 LAB — I-STAT CG4 LACTIC ACID, ED
Lactic Acid, Venous: 0.73 mmol/L (ref 0.5–1.9)
Lactic Acid, Venous: 0.79 mmol/L (ref 0.5–1.9)

## 2016-10-06 LAB — GLUCOSE, CAPILLARY: Glucose-Capillary: 177 mg/dL — ABNORMAL HIGH (ref 65–99)

## 2016-10-06 LAB — TSH: TSH: 0.492 u[IU]/mL (ref 0.350–4.500)

## 2016-10-06 LAB — PROTIME-INR
INR: 1.07
Prothrombin Time: 13.9 seconds (ref 11.4–15.2)

## 2016-10-06 MED ORDER — ACETAMINOPHEN 325 MG PO TABS
650.0000 mg | ORAL_TABLET | Freq: Four times a day (QID) | ORAL | Status: DC | PRN
  Administered 2016-10-06 – 2016-10-10 (×10): 650 mg via ORAL
  Filled 2016-10-06 (×10): qty 2

## 2016-10-06 MED ORDER — PANTOPRAZOLE SODIUM 40 MG PO TBEC
40.0000 mg | DELAYED_RELEASE_TABLET | Freq: Every day | ORAL | Status: DC
  Administered 2016-10-06 – 2016-10-12 (×7): 40 mg via ORAL
  Filled 2016-10-06 (×7): qty 1

## 2016-10-06 MED ORDER — INSULIN ASPART 100 UNIT/ML ~~LOC~~ SOLN: 0.0000 [IU] | Freq: Every day | SUBCUTANEOUS | Status: DC

## 2016-10-06 MED ORDER — ONDANSETRON HCL 4 MG PO TABS: 4.0000 mg | ORAL_TABLET | Freq: Four times a day (QID) | ORAL | Status: DC | PRN

## 2016-10-06 MED ORDER — ENOXAPARIN SODIUM 40 MG/0.4ML ~~LOC~~ SOLN
40.0000 mg | SUBCUTANEOUS | Status: DC
  Administered 2016-10-06 – 2016-10-11 (×6): 40 mg via SUBCUTANEOUS
  Filled 2016-10-06 (×6): qty 0.4

## 2016-10-06 MED ORDER — INSULIN ASPART 100 UNIT/ML ~~LOC~~ SOLN
0.0000 [IU] | Freq: Three times a day (TID) | SUBCUTANEOUS | Status: DC
Start: 2016-10-07 — End: 2016-10-12
  Administered 2016-10-07 – 2016-10-08 (×5): 2 [IU] via SUBCUTANEOUS
  Administered 2016-10-08: 1 [IU] via SUBCUTANEOUS
  Administered 2016-10-09 – 2016-10-11 (×3): 2 [IU] via SUBCUTANEOUS

## 2016-10-06 MED ORDER — VANCOMYCIN HCL IN DEXTROSE 1-5 GM/200ML-% IV SOLN
1000.0000 mg | Freq: Once | INTRAVENOUS | Status: AC
  Administered 2016-10-06: 1000 mg via INTRAVENOUS
  Filled 2016-10-06: qty 200

## 2016-10-06 MED ORDER — ALLOPURINOL 100 MG PO TABS
100.0000 mg | ORAL_TABLET | Freq: Every day | ORAL | Status: DC
  Administered 2016-10-06 – 2016-10-12 (×7): 100 mg via ORAL
  Filled 2016-10-06 (×8): qty 1

## 2016-10-06 MED ORDER — ONDANSETRON HCL 4 MG/2ML IJ SOLN: 4.0000 mg | Freq: Four times a day (QID) | INTRAMUSCULAR | Status: DC | PRN

## 2016-10-06 MED ORDER — AMLODIPINE BESYLATE 5 MG PO TABS
5.0000 mg | ORAL_TABLET | Freq: Every day | ORAL | Status: DC
  Administered 2016-10-06 – 2016-10-12 (×7): 5 mg via ORAL
  Filled 2016-10-06 (×7): qty 1

## 2016-10-06 MED ORDER — PIPERACILLIN-TAZOBACTAM 3.375 G IVPB
3.3750 g | Freq: Three times a day (TID) | INTRAVENOUS | Status: DC
  Administered 2016-10-06 – 2016-10-09 (×9): 3.375 g via INTRAVENOUS
  Filled 2016-10-06 (×8): qty 50

## 2016-10-06 MED ORDER — VANCOMYCIN HCL IN DEXTROSE 750-5 MG/150ML-% IV SOLN
750.0000 mg | Freq: Two times a day (BID) | INTRAVENOUS | Status: DC
  Administered 2016-10-07 – 2016-10-09 (×5): 750 mg via INTRAVENOUS
  Filled 2016-10-06 (×9): qty 150

## 2016-10-06 MED ORDER — SIMVASTATIN 20 MG PO TABS
40.0000 mg | ORAL_TABLET | Freq: Every evening | ORAL | Status: DC
  Administered 2016-10-06 – 2016-10-11 (×6): 40 mg via ORAL
  Filled 2016-10-06 (×7): qty 2

## 2016-10-06 MED ORDER — SODIUM CHLORIDE 0.9 % IV BOLUS (SEPSIS)
1000.0000 mL | Freq: Once | INTRAVENOUS | Status: AC
  Administered 2016-10-06: 1000 mL via INTRAVENOUS

## 2016-10-06 MED ORDER — LOSARTAN POTASSIUM 50 MG PO TABS
100.0000 mg | ORAL_TABLET | Freq: Every day | ORAL | Status: DC
  Administered 2016-10-06 – 2016-10-12 (×7): 100 mg via ORAL
  Filled 2016-10-06 (×7): qty 2

## 2016-10-06 MED ORDER — SODIUM CHLORIDE 0.9% FLUSH
3.0000 mL | Freq: Two times a day (BID) | INTRAVENOUS | Status: DC
  Administered 2016-10-06 – 2016-10-12 (×8): 3 mL via INTRAVENOUS

## 2016-10-06 MED ORDER — SODIUM CHLORIDE 0.9 % IV SOLN
INTRAVENOUS | Status: DC
  Administered 2016-10-06 – 2016-10-08 (×2): via INTRAVENOUS

## 2016-10-06 NOTE — H&P (Signed)
History and Physical    [name] ZOX:[id number] DOB: [birth year] DOA: 10/06/2016  PCP: Dwana Melena, MD  Patient coming from: Home.    Chief Complaint:   Chills and weakness.   HPI: [name] is an 76 y.o. female with hx of obesity, gout, DM, HTN, HLD, presented to the ER with 2 days hx of malaise, weakness, subjective fever, and some myalgia.  She did not have HA, sorethroat, SOB, coughs, and her GI and GU ROS was negative.  She has no recent travel, ill contacts, or started on any new meds.  She has no complaints of focal neurological symptoms. Work up in the ER included a clear CXR, negaitve UA, normal lactic acid, and leukocytosis with WBC of 14K.  Her temp was 102F.  She ateempted to ambulate, and felt weak and unable to walk, so hospitalist was asked to admit her for further evaluation.    ED Course:  See above.  Rewiew of Systems:  Constitutional: Negative for malaise, fever and chills. No significant weight loss or weight gain Eyes: Negative for eye pain, redness and discharge, diplopia, visual changes, or flashes of light. ENMT: Negative for ear pain, hoarseness, nasal congestion, sinus pressure and sore throat. No headaches; tinnitus, drooling, or problem swallowing. Cardiovascular: Negative for chest pain, palpitations, diaphoresis, dyspnea and peripheral edema. ; No orthopnea, PND Respiratory: Negative for cough, hemoptysis, wheezing and stridor. No pleuritic chestpain. Gastrointestinal: Negative for diarrhea, constipation,  melena, blood in stool, hematemesis, jaundice and rectal bleeding.    Genitourinary: Negative for frequency, dysuria, incontinence,flank pain and hematuria; Musculoskeletal: Negative for back pain and neck pain. Negative for swelling and trauma.;  Skin: . Negative for pruritus, rash, abrasions, bruising and skin lesion.; ulcerations Neuro: Negative for headache, lightheadedness and neck stiffness. Negative for altered level of consciousness , altered  mental status, extremity weakness, burning feet, involuntary movement, seizure and syncope.  Psych: negative for anxiety, depression, insomnia, tearfulness, panic attacks, hallucinations, paranoia, suicidal or homicidal ideation   Past Medical History:  Diagnosis Date  . Diabetes mellitus without complication (HCC)   . Gout   . Hypercholesteremia   . Hypertension    Past Surgical History:  Procedure Laterality Date  . ABDOMINAL HYSTERECTOMY       reports that she has never smoked. She has never used smokeless tobacco. She reports that she does not drink alcohol or use drugs.  No Known Allergies  History reviewed. No pertinent family history.   Prior to Admission medications   Medication Sig Start Date End Date Taking? Authorizing Provider  acetaminophen (TYLENOL) 325 MG tablet Take 650 mg by mouth every 6 (six) hours as needed.   Yes Historical Provider, MD  allopurinol (ZYLOPRIM) 100 MG tablet Take 100 mg by mouth daily.   Yes Historical Provider, MD  amLODipine (NORVASC) 10 MG tablet Take 5 mg by mouth daily.    Yes Historical Provider, MD  chlorthalidone (HYGROTON) 25 MG tablet Take 12.5 mg by mouth daily. 05/17/14  Yes Historical Provider, MD  losartan (COZAAR) 100 MG tablet Take 100 mg by mouth daily.   Yes Historical Provider, MD  metFORMIN (GLUCOPHAGE) 1000 MG tablet Take 1,000 mg by mouth 2 (two) times daily with a meal.   Yes Historical Provider, MD  naproxen (NAPROSYN) 500 MG tablet Take 1 tablet by mouth 2 (two) times daily. 08/30/16  Yes Historical Provider, MD  pantoprazole (PROTONIX) 40 MG tablet Take 1 tablet (40 mg total) by mouth daily. 09/13/16  Yes Limmie Patricia  Philip Aspen, MD  simvastatin (ZOCOR) 40 MG tablet Take 40 mg by mouth every evening.   Yes Historical Provider, MD    Physical Exam: Vitals:   10/06/16 1430 10/06/16 1500 10/06/16 1530 10/06/16 1600  BP: 131/71 (!) 136/56 (!) 149/59 140/66  Pulse: 95 96 96 97  Resp: (!) 24 (!) 25 (!) 23 16  Temp:        TempSrc:      SpO2: 100% 97% 96% 91%  Weight:      Height:          Constitutional: NAD, calm, comfortable Vitals:   10/06/16 1430 10/06/16 1500 10/06/16 1530 10/06/16 1600  BP: 131/71 (!) 136/56 (!) 149/59 140/66  Pulse: 95 96 96 97  Resp: (!) 24 (!) 25 (!) 23 16  Temp:      TempSrc:      SpO2: 100% 97% 96% 91%  Weight:      Height:       Eyes: PERRL, lids and conjunctivae normal ENMT: Mucous membranes are moist. Posterior pharynx clear of any exudate or lesions.Normal dentition.  Neck: normal, supple, no masses, no thyromegaly Respiratory: clear to auscultation bilaterally, no wheezing, no crackles. Normal respiratory effort. No accessory muscle use.  Cardiovascular: Regular rate and rhythm, no murmurs / rubs / gallops. No extremity edema. 2+ pedal pulses. No carotid bruits.  Abdomen: no tenderness, no masses palpated. No hepatosplenomegaly. Bowel sounds positive.  Musculoskeletal: no clubbing / cyanosis. No joint deformity upper and lower extremities. Good ROM, no contractures. Normal muscle tone.  Skin: no rashes, lesions, ulcers. No induration Neurologic: CN 2-12 grossly intact. Sensation intact, DTR normal. Strength 5/5 in all 4.  Psychiatric: Normal judgment and insight. Alert and oriented x 3. Normal mood.   Labs on Admission: I have personally reviewed following labs and imaging studies CBC:  Recent Labs Lab 10/06/16 1127  WBC 14.4*  NEUTROABS 11.7*  HGB 12.1  HCT 36.4  MCV 89.0  PLT 222   Basic Metabolic Panel:  Recent Labs Lab 10/06/16 1127  NA 133*  K 3.4*  CL 93*  CO2 30  GLUCOSE 132*  BUN 14  CREATININE 1.01*  CALCIUM 9.5   GFR: Estimated Creatinine Clearance: 49 mL/min (A) (by C-G formula based on SCr of 1.01 mg/dL (H)). Liver Function Tests:  Recent Labs Lab 10/06/16 1127  AST 13*  ALT 13*  ALKPHOS 86  BILITOT 0.8  PROT 6.9  ALBUMIN 3.4*   Coagulation Profile:  Recent Labs Lab 10/06/16 1127  INR 1.07   Cardiac  Enzymes: Urine analysis:    Component Value Date/Time   COLORURINE YELLOW 10/06/2016 1112   APPEARANCEUR CLEAR 10/06/2016 1112   LABSPEC 1.021 10/06/2016 1112   PHURINE 6.0 10/06/2016 1112   GLUCOSEU NEGATIVE 10/06/2016 1112   HGBUR NEGATIVE 10/06/2016 1112   HGBUR negative 10/17/2008 1020   BILIRUBINUR NEGATIVE 10/06/2016 1112   KETONESUR NEGATIVE 10/06/2016 1112   PROTEINUR 100 (A) 10/06/2016 1112   UROBILINOGEN 1.0 10/17/2008 1020   NITRITE NEGATIVE 10/06/2016 1112   LEUKOCYTESUR NEGATIVE 10/06/2016 1112    Recent Results (from the past 240 hour(s))  Culture, blood (Routine x 2)     Status: None (Preliminary result)   Collection Time: 10/06/16 11:30 AM  Result Value Ref Range Status   Specimen Description BLOOD LEFT ARM  Final   Special Requests Blood Culture adequate volume  Final   Culture PENDING  Incomplete   Report Status PENDING  Incomplete  Culture, blood (Routine x 2)  Status: None (Preliminary result)   Collection Time: 10/06/16 11:40 AM  Result Value Ref Range Status   Specimen Description BLOOD LEFT ARM  Final   Special Requests   Final    BOTTLES DRAWN AEROBIC AND ANAEROBIC Blood Culture adequate volume   Culture PENDING  Incomplete   Report Status PENDING  Incomplete     Radiological Exams on Admission: Dg Chest Port 1 View  Result Date: 10/06/2016 CLINICAL DATA:  Weakness, cough and fever. History of hypertension, hypercholesterolemia and diabetes. EXAM: PORTABLE CHEST 1 VIEW COMPARISON:  Chest radiograph September 11, 2016 FINDINGS: Cardiac silhouette is normal in size. Tortuous, possibly ectatic aorta. No pleural effusion or focal consolidation. No pneumothorax. Soft tissue planes and included osseous structures nonsuspicious. IMPRESSION: Stable examination:  No acute cardiopulmonary process. Electronically Signed   By: Awilda Metro M.D.   On: 10/06/2016 13:38    EKG: Independently reviewed.  Assessment/Plan Principal Problem:   Acute viral  syndrome Active Problems:   Weakness   Hypercholesteremia   Gout   Diabetes mellitus without complication (HCC)   Viral syndrome   PLAN:   Early sepsis possible, but unclear etiology.  She presented as if she has a viral syndrome.  BC and urine cultures were obtained.  Given high fever, leukocytosis, and weakness, though no clear source, and normal lactic with no hypotension, will start her on broad IV antibiotics pending culture.  She will be given IVF, and will consult PT.    HLD:  Continue with statin.  She has no myalgia.  DM:  Will give her sliding scale insulin.  Hold Metformin.  HTN: Continue with home meds. Hold diuretics.    Gout:  Continue with home meds.   DVT prophylaxis: Lovenox Code Status: FULL CODE.  Family Communication: Husband at bedside.  Disposition Plan: Home.  Consults called: None.  Admission status: OBS.    Robertt Buda MD FACP. [facility]  If 7PM-7AM, please contact night-coverage [email or web address] Password [facility]  10/06/2016, 4:35 PM

## 2016-10-06 NOTE — Discharge Instructions (Signed)
Increase fluids. Tylenol for fever. Return if worse. °

## 2016-10-06 NOTE — ED Triage Notes (Signed)
Pt c/o urinary frequency, incontinence, and foul smelling urine for the past few days.  Reports progressive weakness.  Fever today 101.1 and ems administered  tylenol PTA.  cbg was 134.  EMS also reports diarrhea yesterday.

## 2016-10-06 NOTE — Progress Notes (Signed)
  Pharmacy Antibiotic Note  [name] is a 76 y.o. female admitted on 10/06/2016 with sepsis.  Pharmacy has been consulted for Vancomycin and Zosyn dosing.  Plan: Vancomycin 1gm IV load. Vancomycin  IV every 12 hours.  Goal trough 15-20 mcg/mL. Zosyn 3.375g IV q8h (4 hour infusion).  Monitor labs, micro and vitals.    Height:  (157.5 cm) Weight: 195 lb 4.8 oz (88.6 kg) IBW/kg (Calculated) : 50.1  Temp (24hrs), Avg:102.6 F (39.2 C), Min:102.6 F (39.2 C), Max:102.6 F (39.2 C)   Recent Labs Lab 10/06/16 1122 10/06/16 1127 10/06/16 1426  WBC  --  14.4*  --   CREATININE  --  1.01*  --   LATICACIDVEN 0.73  --  0.79    Estimated Creatinine Clearance: 49.8 mL/min (A) (by C-G formula based on SCr of 1.01 mg/dL (H)).    No Known Allergies  Antimicrobials this admission: Vanc 5/2 >>  Zosyn 5/2 >>   Dose adjustments this admission: n/a   Microbiology results: 5/2 BCx: pending  Thank you for allowing pharmacy to be a part of this patient's care.  Mady Gemma 10/06/2016 6:24 PM

## 2016-10-06 NOTE — ED Provider Notes (Signed)
[facility]-EMERGENCY DEPT Provider Note   CSN: [id number] Arrival date & time: 10/06/16  1103   By signing my name below, I, Bobbie Stack, attest that this documentation has been prepared under the direction and in the presence of Donnetta Hutching, MD. Electronically Signed: Bobbie Stack, Scribe. 10/06/16. 11:42 AM. History   Chief Complaint Chief Complaint  Patient presents with  . Fever  . Fatigue    The history is provided by the patient. No language interpreter was used.  HPI Comments: [name] is a 76 y.o. female brought in by ambulance, who presents to the Emergency Department complaining of generalized weakness since yesterday. Per EMS: Patient has a fever of 101.1. Patient was given 650 mg tylenol while en route to the ED. Her CBG was 134. The patient states that she has not been able to get out of bed since yesterday morning. She reports generalized body aches, cough, and some urinary incontinence currently. She states that her urinary incontinence has been ongoing for a while. She denies nausea, vomiting, or SOB recently.  Past Medical History:  Diagnosis Date  . Diabetes mellitus without complication (HCC)   . Gout   . Hypercholesteremia   . Hypertension     Patient Active Problem List   Diagnosis Date Noted  . Precordial chest pain 09/11/2016  . Diabetes (HCC) 05/03/2016  . HYPOKALEMIA 11/01/2008  . HYPERGLYCEMIA, FASTING 11/01/2008  . Type 2 diabetes mellitus with hyperlipidemia (HCC) 10/17/2008  . GOUT, UNSPECIFIED 10/17/2008  . OBESITY 10/17/2008  . MIGRAINE HEADACHE 10/17/2008  . Essential hypertension 10/17/2008  . INTERNAL HEMORRHOIDS 10/17/2008  . DIVERTICULOSIS, COLON 10/17/2008  . ARTHRITIS 10/17/2008    Past Surgical History:  Procedure Laterality Date  . ABDOMINAL HYSTERECTOMY      OB History    No data available       Home Medications    Prior to Admission medications   Medication Sig Start Date End Date Taking? Authorizing Provider    acetaminophen (TYLENOL) 325 MG tablet Take 650 mg by mouth every 6 (six) hours as needed.   Yes Historical Provider, MD  allopurinol (ZYLOPRIM) 100 MG tablet Take 100 mg by mouth daily.   Yes Historical Provider, MD  amLODipine (NORVASC) 10 MG tablet Take 5 mg by mouth daily.    Yes Historical Provider, MD  chlorthalidone (HYGROTON) 25 MG tablet Take 12.5 mg by mouth daily. 05/17/14  Yes Historical Provider, MD  losartan (COZAAR) 100 MG tablet Take 100 mg by mouth daily.   Yes Historical Provider, MD  metFORMIN (GLUCOPHAGE) 1000 MG tablet Take 1,000 mg by mouth 2 (two) times daily with a meal.   Yes Historical Provider, MD  naproxen (NAPROSYN) 500 MG tablet Take 1 tablet by mouth 2 (two) times daily. 08/30/16  Yes Historical Provider, MD  pantoprazole (PROTONIX) 40 MG tablet Take 1 tablet (40 mg total) by mouth daily. 09/13/16  Yes Henderson Cloud, MD  simvastatin (ZOCOR) 40 MG tablet Take 40 mg by mouth every evening.   Yes Historical Provider, MD    Family History No family history on file.  Social History Social History  Substance Use Topics  . Smoking status: Never Smoker  . Smokeless tobacco: Never Used  . Alcohol use No     Allergies   Patient has no known allergies.   Review of Systems Review of Systems A complete 10 system review of systems was obtained and all systems are negative except as noted in the HPI and PMH.  Physical Exam Updated Vital Signs BP (!) 151/74   Pulse 88   Temp (!) 102.6 F (39.2 C) (Rectal)   Resp (!) 25   Ht  (1.575 m)   Wt 190 lb (86.2 kg)   SpO2 97%   BMI 34.75 kg/m   Physical Exam  Constitutional: She is oriented to person, place, and time. She appears well-developed and well-nourished.  Alert. Answers questions appropriately. Low energy.  HENT:  Head: Normocephalic and atraumatic.  Eyes: Conjunctivae are normal.  Neck: Neck supple.  Cardiovascular: Normal rate and regular rhythm.   Pulmonary/Chest: Effort normal  and breath sounds normal.  Abdominal: Soft. Bowel sounds are normal.  Musculoskeletal: Normal range of motion.  Neurological: She is alert and oriented to person, place, and time.  Skin: Skin is warm and dry.  Psychiatric: She has a normal mood and affect. Her behavior is normal.  Nursing note and vitals reviewed.  ED Treatments / Results  DIAGNOSTIC STUDIES: Oxygen Saturation is 95% on RA, adequate by my interpretation.    COORDINATION OF CARE: 11:20 AM Discussed treatment plan with pt at bedside and pt agreed to plan. I will check the patient's blood work, CXR, and UA. I will admit her to the hospital.  Labs (all labs ordered are listed, but only abnormal results are displayed) Labs Reviewed  COMPREHENSIVE METABOLIC PANEL - Abnormal; Notable for the following:       Result Value   Sodium 133 (*)    Potassium 3.4 (*)    Chloride 93 (*)    Glucose, Bld 132 (*)    Creatinine, Ser 1.01 (*)    Albumin 3.4 (*)    AST 13 (*)    ALT 13 (*)    GFR calc non Af Amer 53 (*)    All other components within normal limits  CBC WITH DIFFERENTIAL/PLATELET - Abnormal; Notable for the following:    WBC 14.4 (*)    Neutro Abs 11.7 (*)    Monocytes Absolute 1.4 (*)    All other components within normal limits  URINALYSIS, ROUTINE W REFLEX MICROSCOPIC - Abnormal; Notable for the following:    Protein, ur 100 (*)    Squamous Epithelial / LPF 0-5 (*)    All other components within normal limits  CULTURE, BLOOD (ROUTINE X 2)  CULTURE, BLOOD (ROUTINE X 2)  PROTIME-INR  I-STAT CG4 LACTIC ACID, ED  I-STAT CG4 LACTIC ACID, ED    EKG  EKG Interpretation None       Radiology Dg Chest Port 1 View  Result Date: 10/06/2016 CLINICAL DATA:  Weakness, cough and fever. History of hypertension, hypercholesterolemia and diabetes. EXAM: PORTABLE CHEST 1 VIEW COMPARISON:  Chest radiograph September 11, 2016 FINDINGS: Cardiac silhouette is normal in size. Tortuous, possibly ectatic aorta. No pleural effusion  or focal consolidation. No pneumothorax. Soft tissue planes and included osseous structures nonsuspicious. IMPRESSION: Stable examination:  No acute cardiopulmonary process. Electronically Signed   By: Awilda Metro M.D.   On: 10/06/2016 13:38    Procedures Procedures (including critical care time)  Medications Ordered in ED Medications  sodium chloride 0.9 % bolus 1,000 mL (1,000 mLs Intravenous New Bag/Given 10/06/16 1413)     Initial Impression / Assessment and Plan / ED Course  I have reviewed the triage vital signs and the nursing notes.  Pertinent labs & imaging results that were available during my care of the patient were reviewed by me and considered in my medical decision making (see chart for  details).    Patient remains febrile. She is too weak to raise herself off the bed. Screening tests were reasonable. Will admit to observation.  Final Clinical Impressions(s) / ED Diagnoses   Final diagnoses:  Weakness    New Prescriptions New Prescriptions   No medications on file   I personally performed the services described in this documentation, which was scribed in my presence. The recorded information has been reviewed and is accurate.     Donnetta Hutching, MD 10/06/16 [phone]

## 2016-10-07 ENCOUNTER — Other Ambulatory Visit: Payer: Self-pay

## 2016-10-07 DIAGNOSIS — B349 Viral infection, unspecified: Secondary | ICD-10-CM | POA: Diagnosis not present

## 2016-10-07 DIAGNOSIS — J13 Pneumonia due to Streptococcus pneumoniae: Secondary | ICD-10-CM | POA: Diagnosis not present

## 2016-10-07 DIAGNOSIS — J189 Pneumonia, unspecified organism: Secondary | ICD-10-CM | POA: Diagnosis not present

## 2016-10-07 DIAGNOSIS — Z6834 Body mass index (BMI) 34.0-34.9, adult: Secondary | ICD-10-CM | POA: Diagnosis not present

## 2016-10-07 DIAGNOSIS — E785 Hyperlipidemia, unspecified: Secondary | ICD-10-CM | POA: Diagnosis present

## 2016-10-07 DIAGNOSIS — Z794 Long term (current) use of insulin: Secondary | ICD-10-CM | POA: Diagnosis not present

## 2016-10-07 DIAGNOSIS — J181 Lobar pneumonia, unspecified organism: Secondary | ICD-10-CM | POA: Diagnosis present

## 2016-10-07 DIAGNOSIS — I700 Atherosclerosis of aorta: Secondary | ICD-10-CM | POA: Diagnosis present

## 2016-10-07 DIAGNOSIS — E669 Obesity, unspecified: Secondary | ICD-10-CM | POA: Diagnosis present

## 2016-10-07 DIAGNOSIS — E119 Type 2 diabetes mellitus without complications: Secondary | ICD-10-CM | POA: Diagnosis present

## 2016-10-07 DIAGNOSIS — M109 Gout, unspecified: Secondary | ICD-10-CM | POA: Diagnosis present

## 2016-10-07 DIAGNOSIS — M47816 Spondylosis without myelopathy or radiculopathy, lumbar region: Secondary | ICD-10-CM | POA: Diagnosis present

## 2016-10-07 DIAGNOSIS — R531 Weakness: Secondary | ICD-10-CM | POA: Diagnosis present

## 2016-10-07 DIAGNOSIS — A4189 Other specified sepsis: Secondary | ICD-10-CM | POA: Diagnosis present

## 2016-10-07 DIAGNOSIS — I10 Essential (primary) hypertension: Secondary | ICD-10-CM | POA: Diagnosis present

## 2016-10-07 DIAGNOSIS — R5081 Fever presenting with conditions classified elsewhere: Secondary | ICD-10-CM | POA: Diagnosis not present

## 2016-10-07 DIAGNOSIS — Z79899 Other long term (current) drug therapy: Secondary | ICD-10-CM | POA: Diagnosis not present

## 2016-10-07 DIAGNOSIS — E7800 Pure hypercholesterolemia, unspecified: Secondary | ICD-10-CM | POA: Diagnosis present

## 2016-10-07 DIAGNOSIS — M47814 Spondylosis without myelopathy or radiculopathy, thoracic region: Secondary | ICD-10-CM | POA: Diagnosis present

## 2016-10-07 LAB — COMPREHENSIVE METABOLIC PANEL
ALBUMIN: 2.9 g/dL — AB (ref 3.5–5.0)
ALT: 14 U/L (ref 14–54)
ANION GAP: 10 (ref 5–15)
AST: 15 U/L (ref 15–41)
Alkaline Phosphatase: 73 U/L (ref 38–126)
BILIRUBIN TOTAL: 0.9 mg/dL (ref 0.3–1.2)
BUN: 12 mg/dL (ref 6–20)
CALCIUM: 8.5 mg/dL — AB (ref 8.9–10.3)
CO2: 26 mmol/L (ref 22–32)
CREATININE: 0.91 mg/dL (ref 0.44–1.00)
Chloride: 96 mmol/L — ABNORMAL LOW (ref 101–111)
GFR calc non Af Amer: >60 mL/min (ref >60)
GLUCOSE: 134 mg/dL — AB (ref 65–99)
POTASSIUM: 2.8 mmol/L — AB (ref 3.5–5.1)
Sodium: 132 mmol/L — ABNORMAL LOW (ref 135–145)
TOTAL PROTEIN: 6.2 g/dL — AB (ref 6.5–8.1)

## 2016-10-07 LAB — CBC
HCT: 33.7 % — ABNORMAL LOW (ref 36.0–46.0)
Hemoglobin: 11.2 g/dL — ABNORMAL LOW (ref 12.0–15.0)
MCH: 29.7 pg (ref 26.0–34.0)
MCHC: 33.2 g/dL (ref 30.0–36.0)
MCV: 89.4 fL (ref 78.0–100.0)
PLATELETS: 198 10*3/uL (ref 150–400)
RBC: 3.77 MIL/uL — ABNORMAL LOW (ref 3.87–5.11)
RDW: 14.8 % (ref 11.5–15.5)
WBC: 12.5 10*3/uL — ABNORMAL HIGH (ref 4.0–10.5)

## 2016-10-07 LAB — BASIC METABOLIC PANEL
Anion gap: 7 (ref 5–15)
BUN: 13 mg/dL (ref 6–20)
CHLORIDE: 97 mmol/L — AB (ref 101–111)
CO2: 27 mmol/L (ref 22–32)
CREATININE: 1 mg/dL (ref 0.44–1.00)
Calcium: 8.2 mg/dL — ABNORMAL LOW (ref 8.9–10.3)
GFR calc Af Amer: >60 mL/min (ref >60)
GFR, EST NON AFRICAN AMERICAN: 54 mL/min — AB (ref >60)
GLUCOSE: 158 mg/dL — AB (ref 65–99)
Potassium: 2.8 mmol/L — ABNORMAL LOW (ref 3.5–5.1)
SODIUM: 131 mmol/L — AB (ref 135–145)

## 2016-10-07 LAB — GLUCOSE, CAPILLARY
GLUCOSE-CAPILLARY: 121 mg/dL — AB (ref 65–99)
GLUCOSE-CAPILLARY: 124 mg/dL — AB (ref 65–99)
GLUCOSE-CAPILLARY: 141 mg/dL — AB (ref 65–99)
Glucose-Capillary: 137 mg/dL — ABNORMAL HIGH (ref 65–99)

## 2016-10-07 MED ORDER — POTASSIUM CHLORIDE 10 MEQ/100ML IV SOLN
10.0000 meq | INTRAVENOUS | Status: AC
  Administered 2016-10-07 (×4): 10 meq via INTRAVENOUS
  Filled 2016-10-07 (×3): qty 100

## 2016-10-07 MED ORDER — VANCOMYCIN HCL IN DEXTROSE 750-5 MG/150ML-% IV SOLN
INTRAVENOUS | Status: AC
  Filled 2016-10-07: qty 150

## 2016-10-07 MED ORDER — IBUPROFEN 600 MG PO TABS
600.0000 mg | ORAL_TABLET | Freq: Four times a day (QID) | ORAL | Status: DC | PRN
  Administered 2016-10-07 – 2016-10-11 (×10): 600 mg via ORAL
  Filled 2016-10-07 (×11): qty 1

## 2016-10-07 NOTE — Progress Notes (Signed)
PROGRESS NOTE    SHYAN SCALISI  WUJ:[id number] DOB: July 01, 1940 DOA: 10/06/2016 PCP: Dwana Melena, MD    Brief Narrative:  [name] is an 76 y.o. female with hx of obesity, gout, DM, HTN, HLD, presented to the ER with 2 days hx of malaise, weakness, subjective fever, and some myalgia.  She did not have HA, sorethroat, SOB, coughs, and her GI and GU ROS was negative.  She has no recent travel, ill contacts, or started on any new meds.  She has no complaints of focal neurological symptoms. Work up in the ER included a clear CXR, negaitve UA, normal lactic acid, and leukocytosis with WBC of 14K.  Her temp was 102F.  She ateempted to ambulate, and felt weak and unable to walk, so hospitalist was asked to admit her for further evaluation.    Assessment & Plan:   Principal Problem:   Acute viral syndrome Active Problems:   Weakness   Hypercholesteremia   Gout   Diabetes mellitus without complication (HCC)   Viral syndrome   Fever - Early sepsis possible, but unclear etiology - presented as if she has a viral syndrome - BC and urine cultures were obtained and currently are not showing any growth - continue antibiotics for time being - tylenol and ibuprofen for fever alternating   HLD - Continue with statin.  She has no myalgia.  DM - Will give her sliding scale insulin  HTN - Continue with home meds - restart diuretics when patient eating and drinking more    Gout - Continue with home meds.   DVT prophylaxis: Lovenox Code Status: FULL CODE.  Family Communication: Husband and two women are bedside.  Disposition Plan:unclear at this time- pending improvement   Consultants:   CM  PT  OT  Procedures:   none  Antimicrobials:   Zosyn  Vancomycin    Subjective: Patient seen and examined- family is all bedside. Patient reports feeling weak and tired.  She just had a fever of 103.2.  Family concerned that patient cannot move her legs but patient is moving  her legs in the bed.  Family voice concern about possible [facility] Spotted Fever.  Objective: Vitals:   10/06/16 2104 10/07/16 0405 10/07/16 1143 10/07/16 1419  BP: (!) 118/50 (!) 130/58  (!) 125/46  Pulse: 88 (!) 103  86  Resp: 18 18  18   Temp: 98.6 F (37 C) 100 F (37.8 C) (!) 103.2 F (39.6 C) 100.1 F (37.8 C)  TempSrc: Oral Oral Oral Oral  SpO2: 96% 93%  99%  Weight:      Height:        Intake/Output Summary (Last 24 hours) at 10/07/16 1644 Last data filed at 10/07/16 0900  Gross per 24 hour  Intake          2631.25 ml  Output                0 ml  Net          2631.25 ml   Filed Weights   10/06/16 1105 10/06/16 1821  Weight: 86.2 kg (190 lb) 88.6 kg (195 lb 4.8 oz)    Examination:  General exam: moderate distress Respiratory system: Clear to auscultation. Respiratory effort normal. Cardiovascular system: S1 & S2 heard, RRR. No JVD, murmurs, rubs, gallops or clicks. No pedal edema. Gastrointestinal system: Abdomen is nondistended, soft and nontender. No organomegaly or masses felt. Normal bowel sounds heard. Central nervous system: Alert and oriented. No  focal neurological deficits. Extremities: Symmetric 4 x 5 power.  Able to move arms and legs spontaneously and independently.  Equal power bilaterally, no joint swelling, no cyanosis, clubbing or edema Skin: No rashes, lesions or ulcers Psychiatry: Mood & affect appropriate.     Data Reviewed: I have personally reviewed following labs and imaging studies  CBC:  Recent Labs Lab 10/06/16 1127 10/07/16 0535  WBC 14.4* 12.5*  NEUTROABS 11.7*  --   HGB 12.1 11.2*  HCT 36.4 33.7*  MCV 89.0 89.4  PLT 222 198   Basic Metabolic Panel:  Recent Labs Lab 10/06/16 1127 10/07/16 0535 10/07/16 1259  NA 133* 132* 131*  K 3.4* 2.8* 2.8*  CL 93* 96* 97*  CO2 30 26 27   GLUCOSE 132* 134* 158*  BUN 14 12 13   CREATININE 1.01* 0.91 1.00  CALCIUM 9.5 8.5* 8.2*   GFR: Estimated Creatinine Clearance: 50.3  mL/min (by C-G formula based on SCr of 1 mg/dL). Liver Function Tests:  Recent Labs Lab 10/06/16 1127 10/07/16 0535  AST 13* 15  ALT 13* 14  ALKPHOS 86 73  BILITOT 0.8 0.9  PROT 6.9 6.2*  ALBUMIN 3.4* 2.9*   No results for input(s): LIPASE, AMYLASE in the last 168 hours. No results for input(s): AMMONIA in the last 168 hours. Coagulation Profile:  Recent Labs Lab 10/06/16 1127  INR 1.07   Cardiac Enzymes: No results for input(s): CKTOTAL, CKMB, CKMBINDEX, TROPONINI in the last 168 hours. BNP (last 3 results) No results for input(s): PROBNP in the last 8760 hours. HbA1C: No results for input(s): HGBA1C in the last 72 hours. CBG:  Recent Labs Lab 10/06/16 2103 10/07/16 0730 10/07/16 1056  GLUCAP 177* 137* 121*   Lipid Profile: No results for input(s): CHOL, HDL, LDLCALC, TRIG, CHOLHDL, LDLDIRECT in the last 72 hours. Thyroid Function Tests:  Recent Labs  10/06/16 1149  TSH 0.492   Anemia Panel: No results for input(s): VITAMINB12, FOLATE, FERRITIN, TIBC, IRON, RETICCTPCT in the last 72 hours. Sepsis Labs:  Recent Labs Lab 10/06/16 1122 10/06/16 1426  LATICACIDVEN 0.73 0.79    Recent Results (from the past 240 hour(s))  Culture, blood (Routine x 2)     Status: None (Preliminary result)   Collection Time: 10/06/16 11:30 AM  Result Value Ref Range Status   Specimen Description BLOOD LEFT ARM  Final   Special Requests Blood Culture adequate volume  Final   Culture NO GROWTH < 24 HOURS  Final   Report Status PENDING  Incomplete  Culture, blood (Routine x 2)     Status: None (Preliminary result)   Collection Time: 10/06/16 11:40 AM  Result Value Ref Range Status   Specimen Description BLOOD LEFT ARM  Final   Special Requests   Final    BOTTLES DRAWN AEROBIC AND ANAEROBIC Blood Culture adequate volume   Culture NO GROWTH < 24 HOURS  Final   Report Status PENDING  Incomplete         Radiology Studies: Dg Chest Port 1 View  Result Date:  10/06/2016 CLINICAL DATA:  Weakness, cough and fever. History of hypertension, hypercholesterolemia and diabetes. EXAM: PORTABLE CHEST 1 VIEW COMPARISON:  Chest radiograph September 11, 2016 FINDINGS: Cardiac silhouette is normal in size. Tortuous, possibly ectatic aorta. No pleural effusion or focal consolidation. No pneumothorax. Soft tissue planes and included osseous structures nonsuspicious. IMPRESSION: Stable examination:  No acute cardiopulmonary process. Electronically Signed   By: Awilda Metroourtnay  Bloomer M.D.   On: 10/06/2016 13:38  Scheduled Meds: . allopurinol  100 mg Oral Daily  . amLODipine  5 mg Oral Daily  . enoxaparin (LOVENOX) injection  40 mg Subcutaneous Q24H  . insulin aspart  0-15 Units Subcutaneous TID WC  . insulin aspart  0-5 Units Subcutaneous QHS  . losartan  100 mg Oral Daily  . pantoprazole  40 mg Oral Daily  . simvastatin  40 mg Oral QPM  . sodium chloride flush  3 mL Intravenous Q12H   Continuous Infusions: . sodium chloride 75 mL/hr at 10/06/16 1821  . piperacillin-tazobactam (ZOSYN)  IV Stopped (10/07/16 1458)  . potassium chloride 10 mEq (10/07/16 1642)  . vancomycin Stopped (10/07/16 0802)     LOS: 0 days    Time spent: 30 minutes    Katrinka Blazing, MD [facility] Pager [phone]  If 7PM-7AM, please contact night-coverage [email or web address] Password [email or web address] 10/07/2016, 4:44 PM

## 2016-10-07 NOTE — Care Management Note (Addendum)
Case Management Note  Patient Details  Name: Melanie BeamBessie Y Black MRN: [id number] Date of Birth: May 27, 1941  Subjective/Objective:   Adm with Acute viral syndrome/flu swab negative. From home with family. Has cane and RW, uses cane most of the time. Has PCP, still drives to appointments, reports no issus affording medications.                 Action/Plan: Plans to return home at time of discharge.   ADDENDUM: Patient status improving, now [facility]. Patient agreeable. Offered choice of HH agencies. Alroy BailiffLinda Lothian of [facility] notified and will obtain orders from chart. Patient and family aware [facility] services once discharged.    Expected Discharge Date:     10/17/2016             Expected Discharge Plan:  Home/Self Care  In-House Referral:     Discharge planning Services  CM Consult  Post Acute Care Choice:    Choice offered to:     DME Arranged:    DME Agency:     HH Arranged:    HH Agency:     Status of Service:  In process, will continue to follow  If discussed at Long Length of Stay Meetings, dates discussed:    Additional Comments:  Sibley Rolison, Chrystine OilerSharley Diane, RN 10/07/2016, 10:14 AM

## 2016-10-07 NOTE — Patient Outreach (Signed)
[facility] Care Management  10/07/2016  Doreen BeamBessie Y Bille 1940/12/23 [id number]   Patient admitted to hospital.  Hospital liaison made aware.  Will send physician a discipline closure letter.  Bary Lericheionne J Anson Peddie, RN, MSN [facility] Care Management RN Telephonic Health Coach [phone]

## 2016-10-07 NOTE — Evaluation (Signed)
Physical Therapy Evaluation Patient Details Name: [name] MRN: [id number] DOB: 01/14/1941 Today's Date: 10/07/2016   History of Present Illness  76 y.o. female with hx of obesity, gout, DM, HTN, HLD, presented to the ER with 2 days hx of malaise, weakness, subjective fever, and some myalgia.  She did not have HA, sorethroat, SOB, coughs, and her GI and GU ROS was negative.  She has no recent travel, ill contacts, or started on any new meds.  She has no complaints of focal neurological symptoms. Work up in the ER included a clear CXR, negaitve UA, normal lactic acid, and leukocytosis with WBC of 14K.  Her temp was 102F.  She ateempted to ambulate, and felt weak and unable to walk, so hospitalist was asked to admit her for further evaluation.  Early sepsis possible, but unclear etiology.  She presented as if she has a viral syndrome.   Clinical Impression  Pt received in bed, and was agreeable to PT evaluation.  She is lethargic, and sister assists with answering PLOF questions.  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  The pt normally uses a cane for ambulation, and is independent with ADL's.  During PT evaluation, she required Max/Total A for transfer supine<>sit.  She demonstrates strong posterior lean which requires max A to maintain static sitting balance on the EOB.  She demonstrates increased flexor tone in R UE, and is unable to obtain full R elbow passive extension.  She requires repetition for many questions asked today, as well as requests.  Due to her poor static sitting balance, PT was unable to have pt attempt any transfers today.  At this point, she is not safe to return home due to need for max/total A for all functional mobility tasks at this time.  Pending progress, she is recommended for CIR vs SNF.      Follow Up Recommendations  (CIR vs SNF pending progress)    Equipment Recommendations  None recommended by PT    Recommendations for Other Services        Precautions / Restrictions Precautions Precautions: Fall Precaution Comments: Pt initially states that she has not had any falls, but sister corrects her and states that she has had 2 falls in the past 6 months, where she "hit a curb and flipped over it."  However pt states that she did not sustain any injuries from these falls.  Restrictions Weight Bearing Restrictions: No      Mobility  Bed Mobility Overal bed mobility: Needs Assistance Bed Mobility: Supine to Sit;Sit to Supine     Supine to sit: Max assist;Total assist Sit to supine: Max assist;Total assist   General bed mobility comments: Pt demonstrates poor initiation with transfer supine<>sit, and requires assistance to move LE's off the EOB and use of bed pad to scoot hips to the EOB.  Once on the EOB, she requires Max A to maintain static sitting balance due to strong posterior pushing which pt is not able to correct.    Transfers Overall transfer level:  (NA due to poor static sitting balance. )                  Ambulation/Gait                Stairs            Wheelchair Mobility    Modified Rankin (Stroke Patients Only)       Balance Overall balance  assessment: History of Falls;Needs assistance   Sitting balance-Leahy Scale: Zero Sitting balance - Comments: Strong posterior lean which requires Max A for correction.  Attempted to have pt reach with UE's to promote anterior weight shift, however pt is not able to perform this task due to continued posterior pushing.  Postural control: Posterior lean                                   Pertinent Vitals/Pain Pain Assessment: No/denies pain    Home Living   Living Arrangements: Spouse/significant other Available Help at Discharge: Available 24 hours/day Type of Home: House Home Access: Stairs to enter   [employer] of Steps: 2 Home Layout: One level Home Equipment: Cane - single point;Walker - 2 wheels       Prior Function Level of Independence: Independent with assistive device(s)   Gait / Transfers Assistance Needed: Pt ambulates with a cane all the time, and is normally unlimited with community ambulation, and is still driving.   ADL's / Homemaking Assistance Needed: independent  Comments: Pt was still working until Monday.  She cares for an elderly lady.       Hand Dominance   Dominant Hand: Right    Extremity/Trunk Assessment   Upper Extremity Assessment Upper Extremity Assessment: RUE deficits/detail RUE Deficits / Details: Pt seems to have increased flexor tone in R UE - she is not able to fully extend her R UE on her own or with assist from PT    Lower Extremity Assessment Lower Extremity Assessment: Generalized weakness       Communication   Communication: Other (comment) (lethargy)  Cognition Arousal/Alertness: Lethargic (Pt closes eyes frequently, and seems to nearly fall asleep at times.  RN states she had only received her home medications which should not make her lethargic. ) Behavior During Therapy: Flat affect Overall Cognitive Status: Within Functional Limits for tasks assessed                                 General Comments: Pt is slow to respond at times, and needs many questions or requests to be repeated.        General Comments      Exercises     Assessment/Plan    PT Assessment Patient needs continued PT services  PT Problem List Decreased strength;Decreased range of motion;Decreased activity tolerance;Decreased balance;Decreased mobility;Decreased coordination;Decreased cognition;Decreased knowledge of use of DME;Impaired tone;Decreased safety awareness       PT Treatment Interventions DME instruction;Gait training;Functional mobility training;Therapeutic activities;Therapeutic exercise;Balance training;Neuromuscular re-education;Cognitive remediation;Patient/family education;Manual techniques    PT Goals (Current goals can be  found in the Care Plan section)  Acute Rehab PT Goals Patient Stated Goal: Pt wants to get stronger PT Goal Formulation: With patient/family Time For Goal Achievement: 10/21/16 Potential to Achieve Goals: Fair    Frequency 7X/week   Barriers to discharge        Co-evaluation               AM-PAC PT "6 Clicks" Daily Activity  Outcome Measure Difficulty turning over in bed (including adjusting bedclothes, sheets and blankets)?: A Lot Difficulty moving from lying on back to sitting on the side of the bed? : A Lot Difficulty sitting down on and standing up from a chair with arms (e.g., wheelchair, bedside commode, etc,.)?: Total Help needed moving to  and from a bed to chair (including a wheelchair)?: Total Help needed walking in hospital room?: Total Help needed climbing 3-5 steps with a railing? : Total 6 Click Score: 8    End of Session   Activity Tolerance: Patient limited by fatigue;Patient limited by lethargy Patient left: in bed;with call bell/phone within reach;with family/visitor present Nurse Communication: Mobility status PT Visit Diagnosis: Other symptoms and signs involving the nervous system (R29.898);Muscle weakness (generalized) (M62.81);Other abnormalities of gait and mobility (R26.89);History of falling (Z91.81)    Time: 1100-1130 PT Time Calculation (min) (ACUTE ONLY): 30 min   Charges:   PT Evaluation $PT Eval Low Complexity: 1 Procedure PT Treatments $Therapeutic Activity: 8-22 mins   PT G Codes:   PT G-Codes **NOT FOR INPATIENT CLASS** Functional Assessment Tool Used: AM-PAC 6 Clicks Basic Mobility;Clinical judgement Functional Limitation: Mobility: Walking and moving around Mobility: Walking and Moving Around Current Status (Z6109(G8978): At least 80 percent but less than 100 percent impaired, limited or restricted Mobility: Walking and Moving Around Goal Status [phone]([id number]): At least 60 percent but less than 80 percent impaired, limited or restricted     Beth Sujata Maines, PT, DPT X: [phone]

## 2016-10-07 NOTE — Care Management Obs Status (Signed)
MEDICARE OBSERVATION STATUS NOTIFICATION   Patient Details  Name: [name] MRN: [id number] Date of Birth: Sep 06, 1940   Medicare Observation Status Notification Given:  Yes    Jaliza Seifried, Chrystine OilerSharley Diane, RN 10/07/2016, 9:06 AM

## 2016-10-08 LAB — GLUCOSE, CAPILLARY
GLUCOSE-CAPILLARY: 121 mg/dL — AB (ref 65–99)
GLUCOSE-CAPILLARY: 124 mg/dL — AB (ref 65–99)
Glucose-Capillary: 142 mg/dL — ABNORMAL HIGH (ref 65–99)
Glucose-Capillary: 122 mg/dL — ABNORMAL HIGH (ref 65–99)

## 2016-10-08 LAB — BASIC METABOLIC PANEL
ANION GAP: 8 (ref 5–15)
BUN: 11 mg/dL (ref 6–20)
CALCIUM: 8.6 mg/dL — AB (ref 8.9–10.3)
CO2: 29 mmol/L (ref 22–32)
CREATININE: 1.06 mg/dL — AB (ref 0.44–1.00)
Chloride: 99 mmol/L — ABNORMAL LOW (ref 101–111)
GFR, EST AFRICAN AMERICAN: 58 mL/min — AB (ref >60)
GFR, EST NON AFRICAN AMERICAN: 50 mL/min — AB (ref >60)
Glucose, Bld: 122 mg/dL — ABNORMAL HIGH (ref 65–99)
Potassium: 3.3 mmol/L — ABNORMAL LOW (ref 3.5–5.1)
SODIUM: 136 mmol/L (ref 135–145)

## 2016-10-08 NOTE — Progress Notes (Signed)
PROGRESS NOTE    [name]  UJW:[id number] DOB: [birth year] DOA: 10/06/2016 PCP: Dwana Melena, MD    Brief Narrative:  [name] is an 76 y.o. female with hx of obesity, gout, DM, HTN, HLD, presented to the ER with 2 days hx of malaise, weakness, subjective fever, and some myalgia.  She did not have HA, sorethroat, SOB, coughs, and her GI and GU ROS was negative.  She has no recent travel, ill contacts, or started on any new meds.  She has no complaints of focal neurological symptoms. Work up in the ER included a clear CXR, negaitve UA, normal lactic acid, and leukocytosis with WBC of 14K.  Her temp was 102F.  She ateempted to ambulate, and felt weak and unable to walk, so hospitalist was asked to admit her for further evaluation.  She was started on IV Vancomycin and Zosyn and blood cultures were obtained. She had intermittent fevers from 5/3-5/4.   Assessment & Plan:   Principal Problem:   Acute viral syndrome Active Problems:   Weakness   Hypercholesteremia   Gout   Diabetes mellitus without complication (HCC)   Viral syndrome   Fever - unclear etiology - presented as if she has a viral syndrome - BC cultures were obtained and currently are not showing any growth - continue antibiotics for time being - tylenol and ibuprofen for fever alternating  - will need to call infectious disease if patient continues to have fevers  HLD - Continue with statin  DM - continue sliding scale insulin  HTN - Continue with home meds - restart diuretics when patient eating and drinking more    Gout - Continue with home meds.   DVT prophylaxis: Lovenox Code Status: FULL CODE.  Family Communication: Husband, son and female family member bedsie  Disposition Plan:unclear at this time- pending improvement; if afebrile overnight can discharge tomorrow   Consultants:   CM  PT  OT  Procedures:   none  Antimicrobials:   Zosyn  Vancomycin    Subjective: Patient  reports she feels much better this morning.  Says that she feels as though she is starting to get some of her strength back.  She and her family have discussed possibility of sending her to SNF but she would rather go home and receive HH.  Objective: Vitals:   10/07/16 2044 10/07/16 2052 10/08/16 0648 10/08/16 1300  BP: (!) 98/45 (!) 116/50 (!) 135/59 132/67  Pulse: 82  89 84  Resp: 18  18 18   Temp: 98.3 F (36.8 C)  (!) 100.9 F (38.3 C) 98.9 F (37.2 C)  TempSrc: Oral  Oral Oral  SpO2: 96%  100% 96%  Weight:      Height:        Intake/Output Summary (Last 24 hours) at 10/08/16 1732 Last data filed at 10/08/16 1500  Gross per 24 hour  Intake          2725.42 ml  Output                0 ml  Net          2725.42 ml   Filed Weights   10/06/16 1105 10/06/16 1821  Weight: 86.2 kg (190 lb) 88.6 kg (195 lb 4.8 oz)    Examination:  General exam: minimal distress Respiratory system: Clear to auscultation. Respiratory effort normal. Cardiovascular system: S1 & S2 heard, RRR. No JVD, murmurs, rubs, gallops or clicks. No pedal edema. Gastrointestinal system: Abdomen is nondistended,  soft and nontender. No organomegaly or masses felt. Normal bowel sounds heard. Central nervous system: Alert and oriented. No focal neurological deficits. Extremities: Symmetric 5 x 5 power.  Able to move arms and legs spontaneously and independently.  Equal power bilaterally, no joint swelling, no cyanosis, clubbing or edema Skin: No rashes, lesions or ulcers Psychiatry: Mood & affect appropriate.     Data Reviewed: I have personally reviewed following labs and imaging studies  CBC:  Recent Labs Lab 10/06/16 1127 10/07/16 0535  WBC 14.4* 12.5*  NEUTROABS 11.7*  --   HGB 12.1 11.2*  HCT 36.4 33.7*  MCV 89.0 89.4  PLT 222 198   Basic Metabolic Panel:  Recent Labs Lab 10/06/16 1127 10/07/16 0535 10/07/16 1259 10/08/16 0501  NA 133* 132* 131* 136  K 3.4* 2.8* 2.8* 3.3*  CL 93* 96* 97*  99*  CO2 30 26 27 29   GLUCOSE 132* 134* 158* 122*  BUN 14 12 13 11   CREATININE 1.01* 0.91 1.00 1.06*  CALCIUM 9.5 8.5* 8.2* 8.6*   GFR: Estimated Creatinine Clearance: 47.4 mL/min (A) (by C-G formula based on SCr of 1.06 mg/dL (H)). Liver Function Tests:  Recent Labs Lab 10/06/16 1127 10/07/16 0535  AST 13* 15  ALT 13* 14  ALKPHOS 86 73  BILITOT 0.8 0.9  PROT 6.9 6.2*  ALBUMIN 3.4* 2.9*   No results for input(s): LIPASE, AMYLASE in the last 168 hours. No results for input(s): AMMONIA in the last 168 hours. Coagulation Profile:  Recent Labs Lab 10/06/16 1127  INR 1.07   Cardiac Enzymes: No results for input(s): CKTOTAL, CKMB, CKMBINDEX, TROPONINI in the last 168 hours. BNP (last 3 results) No results for input(s): PROBNP in the last 8760 hours. HbA1C: No results for input(s): HGBA1C in the last 72 hours. CBG:  Recent Labs Lab 10/07/16 1643 10/07/16 2122 10/08/16 0816 10/08/16 1200 10/08/16 1630  GLUCAP 124* 141* 121* 142* 122*   Lipid Profile: No results for input(s): CHOL, HDL, LDLCALC, TRIG, CHOLHDL, LDLDIRECT in the last 72 hours. Thyroid Function Tests:  Recent Labs  10/06/16 1149  TSH 0.492   Anemia Panel: No results for input(s): VITAMINB12, FOLATE, FERRITIN, TIBC, IRON, RETICCTPCT in the last 72 hours. Sepsis Labs:  Recent Labs Lab 10/06/16 1122 10/06/16 1426  LATICACIDVEN 0.73 0.79    Recent Results (from the past 240 hour(s))  Culture, blood (Routine x 2)     Status: None (Preliminary result)   Collection Time: 10/06/16 11:30 AM  Result Value Ref Range Status   Specimen Description BLOOD LEFT ARM  Final   Special Requests Blood Culture adequate volume  Final   Culture NO GROWTH 2 DAYS  Final   Report Status PENDING  Incomplete  Culture, blood (Routine x 2)     Status: None (Preliminary result)   Collection Time: 10/06/16 11:40 AM  Result Value Ref Range Status   Specimen Description BLOOD LEFT ARM  Final   Special Requests    Final    BOTTLES DRAWN AEROBIC AND ANAEROBIC Blood Culture adequate volume   Culture NO GROWTH 2 DAYS  Final   Report Status PENDING  Incomplete         Radiology Studies: No results found.      Scheduled Meds: . allopurinol  100 mg Oral Daily  . amLODipine  5 mg Oral Daily  . enoxaparin (LOVENOX) injection  40 mg Subcutaneous Q24H  . insulin aspart  0-15 Units Subcutaneous TID WC  . insulin aspart  0-5 Units  Subcutaneous QHS  . losartan  100 mg Oral Daily  . pantoprazole  40 mg Oral Daily  . simvastatin  40 mg Oral QPM  . sodium chloride flush  3 mL Intravenous Q12H   Continuous Infusions: . sodium chloride 100 mL/hr at 10/08/16 0911  . piperacillin-tazobactam (ZOSYN)  IV Stopped (10/08/16 1445)  . vancomycin Stopped (10/08/16 0732)     LOS: 1 day    Time spent: 30 minutes    Katrinka Blazing, MD [facility] Pager [phone]  If 7PM-7AM, please contact night-coverage [email or web address] Password [facility] 10/08/2016, 5:32 PM

## 2016-10-08 NOTE — Consult Note (Signed)
   [facility]   10/08/2016  Melanie BeamBessie Y Black 1940/10/24 [id number]  Attempted x4 to call into patient's room but was unsuccessful related to "telephone company facility trouble" message. Wanted to make her aware she would no longer be followed by [facility] coach, but would be receiving post hospital discharge follow up from a [facility]  RN Community Case Manager. Was able to make inpatient case manager aware via text. This service does not interfere or replace any services arranged by inpatient case manager or Child psychotherapistsocial worker. For questions or concerns please contact:  Bell Carbo RN, BSN [facility]  Hospital Liaison  [phone]) Business Mobile [phone]) Toll free office

## 2016-10-08 NOTE — Evaluation (Signed)
Occupational Therapy Evaluation Patient Details Name: Melanie BeamBessie Y Pennick MRN: [id number] DOB: 06/08/1940 Today's Date: 10/08/2016    History of Present Illness 76 y.o. female with hx of obesity, gout, DM, HTN, HLD, presented to the ER with 2 days hx of malaise, weakness, subjective fever, and some myalgia.  She did not have HA, sorethroat, SOB, coughs, and her GI and GU ROS was negative.  She has no recent travel, ill contacts, or started on any new meds.  She has no complaints of focal neurological symptoms. Work up in the ER included a clear CXR, negaitve UA, normal lactic acid, and leukocytosis with WBC of 14K.  Her temp was 102F.  She ateempted to ambulate, and felt weak and unable to walk, so hospitalist was asked to admit her for further evaluation.  Early sepsis possible, but unclear etiology.  She presented as if she has a viral syndrome.    Clinical Impression    Pt admitted with weakness most likely due to viral syndrome.  Pt currently with functional limitations due to the deficits listed below (see OT Problem List). 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  On evaluation, patient requires min pa with moblity, bed mobility, BADLs.  Patient fatigues quickly.   Pt will benefit from skilled OT to increase their safety and independence with ADL and functional mobility for ADL to facilitate discharge to venue listed below.    Follow Up Recommendations  Home health OT    Equipment Recommendations  3 in 1 bedside commode    Recommendations for Other Services       Precautions / Restrictions Precautions Precautions: Fall Precaution Comments: Pt initially states that she has not had any falls, but sister corrects her and states that she has had 2 falls in the past 6 months, where she "hit a curb and flipped over it."  However pt states that she did not sustain any injuries from these falls.  Restrictions Weight  Bearing Restrictions: No      Mobility Bed Mobility Overal bed mobility: Needs Assistance Bed Mobility: Supine to Sit     Supine to sit: Min assist;HOB elevated Sit to supine: Max assist;Total assist   General bed mobility comments: Pt requires increased time for supine<>sit, and assistance at trunk  Transfers Overall transfer level: Needs assistance Equipment used: Rolling walker (2 wheeled) Transfers: Sit to/from Stand Sit to Stand: Min assist;+2 physical assistance         General transfer comment: Pt requires vc's for hand placement and to shift weight forward to be able to stand.      Balance Overall balance assessment: History of Falls;Needs assistance   Sitting balance-Leahy Scale: Good Sitting balance - Comments: balance is much improved compared to yesterday.  Pt requires cues for anterior weight shift in prep for sit<>stand.  Pt also requires cues for weight lateral weight shift in order to scoot hips closer to the EOB.  Postural control: Posterior lean   Standing balance-Leahy Scale: Fair                             ADL either performed or assessed with clinical judgement   ADL  General ADL Comments: patient independent with ADLs prior to admission.  Currently, requiring at least min assist with BADLs, functional transfers, bed mobility. This session, patient completed toileting transfer with min pa x 2, completed hygiene with min pa for balance in standing.  Able to stand at sink and wash her hands with min guard for balance.       Vision         Perception     Praxis      Pertinent Vitals/Pain Pain Assessment: No/denies pain     Hand Dominance     Extremity/Trunk Assessment Upper Extremity Assessment Upper Extremity Assessment: Overall WFL for tasks assessed (BUE strength is 3/5)   Lower Extremity Assessment Lower Extremity Assessment: Generalized weakness   Cervical /  Trunk Assessment Cervical / Trunk Assessment: Normal   Communication Communication Communication: No difficulties   Cognition Arousal/Alertness: Awake/alert Behavior During Therapy: Flat affect Overall Cognitive Status: Within Functional Limits for tasks assessed                                 General Comments: Pt slow to respond, but responses are appropriate   General Comments       Exercises     Shoulder Instructions      Home Living Family/patient expects to be discharged to:: Private residence Living Arrangements: Spouse/significant other Available Help at Discharge: Available 24 hours/day Type of Home: House Home Access: Stairs to enter [employer] of Steps: 2   Home Layout: One level               Home Equipment: Cane - single point;Walker - 2 wheels          Prior Functioning/Environment Level of Independence: Independent with assistive device(s)  Gait / Transfers Assistance Needed: Pt ambulates with a cane all the time, and is normally unlimited with community ambulation, and is still driving.  ADL's / Homemaking Assistance Needed: independent   Comments: Pt was still working until Monday.  She cares for an elderly lady.          OT Problem List: Decreased strength;Decreased activity tolerance;Decreased knowledge of use of DME or AE      OT Treatment/Interventions: Self-care/ADL training;Therapeutic exercise;Energy conservation;DME and/or AE instruction;Therapeutic activities;Patient/family education    OT Goals(Current goals can be found in the care plan section) Acute Rehab OT Goals Patient Stated Goal: Pt wants to get stronger OT Goal Formulation: With patient Time For Goal Achievement: 10/15/16 Potential to Achieve Goals: Good ADL Goals Pt Will Perform Grooming: with modified independence;standing Pt Will Perform Upper Body Bathing: with set-up;sitting Pt Will Perform Upper Body Dressing: with set-up;sitting  OT  Frequency: Min 2X/week   Barriers to D/C:    n/a       Co-evaluation PT/OT/SLP Co-Evaluation/Treatment: Yes Reason for Co-Treatment: Complexity of the patient's impairments (multi-system involvement)   OT goals addressed during session: ADL's and self-care      AM-PAC PT "6 Clicks" Daily Activity     Outcome Measure Help from another person eating meals?: None Help from another person taking care of personal grooming?: A Little Help from another person toileting, which includes using toliet, bedpan, or urinal?: A Little Help from another person bathing (including washing, rinsing, drying)?: A Lot Help from another person to put on and taking off regular upper body clothing?: A Lot Help from another person to put on and taking off regular lower body clothing?: A  Lot 6 Click Score: 16   End of Session Equipment Utilized During Treatment: Gait belt;Rolling walker Nurse Communication: Other (comment) (bed needed made up)  Activity Tolerance:   Patient left: in chair;with call bell/phone within reach;with family/visitor present  OT Visit Diagnosis: Muscle weakness (generalized) (M62.81)                Time: 1610-9604 OT Time Calculation (min): 22 min Charges:  OT General Charges $OT Visit: 1 Procedure OT Evaluation $OT Eval Low Complexity: 1 Procedure G-CodesShirlean Mylar, MHA, OTR/L [phone]    10/08/2016, 2:49 PM

## 2016-10-08 NOTE — Progress Notes (Signed)
Physical Therapy Treatment Patient Details Name: Melanie BeamBessie Y Black MRN: [id number] DOB: 1941-04-27 Today's Date: 10/08/2016    History of Present Illness 76 y.o. female with hx of obesity, gout, DM, HTN, HLD, presented to the ER with 2 days hx of malaise, weakness, subjective fever, and some myalgia.  She did not have HA, sorethroat, SOB, coughs, and her GI and GU ROS was negative.  She has no recent travel, ill contacts, or started on any new meds.  She has no complaints of focal neurological symptoms. Work up in the ER included a clear CXR, negaitve UA, normal lactic acid, and leukocytosis with WBC of 14K.  Her temp was 102F.  She ateempted to ambulate, and felt weak and unable to walk, so hospitalist was asked to admit her for further evaluation.  Early sepsis possible, but unclear etiology.  She presented as if she has a viral syndrome.     PT Comments    Pt received in bed, family present, and pt is agreeable to PT tx.  Pt demonstrates significant improvement in all functional mobility today.   Pt required Min A for supine<>sit, and min A +1-2 for sit<>Stand with RW.  She requires cues and assistance for anterior weight shifting.  Pt was able to ambulate 4250ft with RW and min guard, but limited due to fatigue.  D/C recommendations have been changed to HHPT due to significant improvement with functional mobility.    Follow Up Recommendations  Home health PT     Equipment Recommendations  None recommended by PT    Recommendations for Other Services       Precautions / Restrictions Precautions Precautions: Fall Precaution Comments: Pt initially states that she has not had any falls, but sister corrects her and states that she has had 2 falls in the past 6 months, where she "hit a curb and flipped over it."  However pt states that she did not sustain any injuries from these falls.  Restrictions Weight Bearing Restrictions: No    Mobility  Bed Mobility Overal bed mobility: Needs  Assistance Bed Mobility: Supine to Sit     Supine to sit: Min assist;HOB elevated     General bed mobility comments: Pt requires increased time for supine<>sit, and assistance at trunk  Transfers Overall transfer level: Needs assistance Equipment used: Rolling walker (2 wheeled) Transfers: Sit to/from Stand Sit to Stand: Min assist;+2 physical assistance         General transfer comment: Pt requires vc's for hand placement and to shift weight forward to be able to stand.    Ambulation/Gait Ambulation/Gait assistance: Min guard Ambulation Distance (Feet): 50 Feet Assistive device: Rolling walker (2 wheeled) Gait Pattern/deviations: Step-through pattern;Wide base of support     General Gait Details: Gait distance is limited due to fatigue    Stairs            Wheelchair Mobility    Modified Rankin (Stroke Patients Only)       Balance Overall balance assessment: History of Falls;Needs assistance   Sitting balance-Leahy Scale: Good Sitting balance - Comments: balance is much improved compared to yesterday.  Pt requires cues for anterior weight shift in prep for sit<>stand.  Pt also requires cues for weight lateral weight shift in order to scoot hips closer to the EOB.      Standing balance-Leahy Scale: Fair  Cognition Arousal/Alertness: Awake/alert Behavior During Therapy: Flat affect Overall Cognitive Status: Within Functional Limits for tasks assessed                                 General Comments: Pt continues to be slow to respond, but responses are appropriate      Exercises      General Comments        Pertinent Vitals/Pain Pain Assessment: No/denies pain    Home Living                      Prior Function            PT Goals (current goals can now be found in the care plan section) Acute Rehab PT Goals Patient Stated Goal: Pt wants to get stronger PT Goal Formulation:  With patient/family Time For Goal Achievement: 10/21/16 Potential to Achieve Goals: Fair Progress towards PT goals: Progressing toward goals    Frequency    Min 3X/week      PT Plan Discharge plan needs to be updated    Co-evaluation PT/OT/SLP Co-Evaluation/Treatment: Yes Reason for Co-Treatment: Complexity of the patient's impairments (multi-system involvement);To address functional/ADL transfers          AM-PAC PT "6 Clicks" Daily Activity  Outcome Measure  Difficulty turning over in bed (including adjusting bedclothes, sheets and blankets)?: A Little Difficulty moving from lying on back to sitting on the side of the bed? : A Little Difficulty sitting down on and standing up from a chair with arms (e.g., wheelchair, bedside commode, etc,.)?: A Little Help needed moving to and from a bed to chair (including a wheelchair)?: A Little Help needed walking in hospital room?: A Little Help needed climbing 3-5 steps with a railing? : A Little 6 Click Score: 18    End of Session Equipment Utilized During Treatment: Gait belt Activity Tolerance: Patient limited by fatigue;Patient limited by lethargy Patient left: in bed;with call bell/phone within reach;with family/visitor present Nurse Communication: Mobility status PT Visit Diagnosis: Other symptoms and signs involving the nervous system (R29.898);Muscle weakness (generalized) (M62.81);Other abnormalities of gait and mobility (R26.89);History of falling (Z91.81)     Time: 4098-1191 PT Time Calculation (min) (ACUTE ONLY): 22 min  Charges:  $Gait Training: 8-22 mins                    G Codes:       Beth Kaedin Hicklin, PT, DPT X: [phone]

## 2016-10-08 NOTE — Progress Notes (Signed)
LCSW consulted for SNF placement.  Met with patient and daughter at the bedside. Discussed role and reason for consult. Daughter reports patient is much better and stronger and able to stand and feels she will be fine to go home. At this time they are declining SNF placement, but open to home health.  CM made aware of disposition.  No other needs voiced by patient or family at this time. Available if needs arise.  Lane Hacker, MSW Clinical Social Work: [employer] Coverage for :  [phone]

## 2016-10-09 ENCOUNTER — Inpatient Hospital Stay (HOSPITAL_COMMUNITY): Payer: Medicare HMO

## 2016-10-09 DIAGNOSIS — N281 Cyst of kidney, acquired: Secondary | ICD-10-CM

## 2016-10-09 DIAGNOSIS — K5790 Diverticulosis of intestine, part unspecified, without perforation or abscess without bleeding: Secondary | ICD-10-CM

## 2016-10-09 DIAGNOSIS — K7689 Other specified diseases of liver: Secondary | ICD-10-CM

## 2016-10-09 HISTORY — DX: Cyst of kidney, acquired: N28.1

## 2016-10-09 HISTORY — DX: Diverticulosis of intestine, part unspecified, without perforation or abscess without bleeding: K57.90

## 2016-10-09 HISTORY — DX: Other specified diseases of liver: K76.89

## 2016-10-09 LAB — CBC WITH DIFFERENTIAL/PLATELET
Basophils Absolute: 0 10*3/uL (ref 0.0–0.1)
Basophils Relative: 0 %
Eosinophils Absolute: 0 10*3/uL (ref 0.0–0.7)
Eosinophils Relative: 0 %
HEMATOCRIT: 33 % — AB (ref 36.0–46.0)
HEMOGLOBIN: 11.1 g/dL — AB (ref 12.0–15.0)
LYMPHS ABS: 1 10*3/uL (ref 0.7–4.0)
LYMPHS PCT: 11 %
MCH: 29.8 pg (ref 26.0–34.0)
MCHC: 33.6 g/dL (ref 30.0–36.0)
MCV: 88.5 fL (ref 78.0–100.0)
Monocytes Absolute: 0.7 10*3/uL (ref 0.1–1.0)
Monocytes Relative: 7 %
NEUTROS ABS: 7.6 10*3/uL (ref 1.7–7.7)
NEUTROS PCT: 82 %
Platelets: 178 10*3/uL (ref 150–400)
RBC: 3.73 MIL/uL — AB (ref 3.87–5.11)
RDW: 14.6 % (ref 11.5–15.5)
WBC: 9.3 10*3/uL (ref 4.0–10.5)

## 2016-10-09 LAB — BASIC METABOLIC PANEL
Anion gap: 9 (ref 5–15)
BUN: 10 mg/dL (ref 6–20)
CHLORIDE: 100 mmol/L — AB (ref 101–111)
CO2: 25 mmol/L (ref 22–32)
Calcium: 8.1 mg/dL — ABNORMAL LOW (ref 8.9–10.3)
Creatinine, Ser: 0.83 mg/dL (ref 0.44–1.00)
GFR calc non Af Amer: >60 mL/min (ref >60)
Glucose, Bld: 107 mg/dL — ABNORMAL HIGH (ref 65–99)
POTASSIUM: 2.7 mmol/L — AB (ref 3.5–5.1)
SODIUM: 134 mmol/L — AB (ref 135–145)

## 2016-10-09 LAB — GLUCOSE, CAPILLARY
Glucose-Capillary: 95 mg/dL (ref 65–99)
Glucose-Capillary: 126 mg/dL — ABNORMAL HIGH (ref 65–99)
Glucose-Capillary: 115 mg/dL — ABNORMAL HIGH (ref 65–99)
Glucose-Capillary: 139 mg/dL — ABNORMAL HIGH (ref 65–99)

## 2016-10-09 MED ORDER — DEXTROSE 5 % IV SOLN
1.0000 g | INTRAVENOUS | Status: DC
  Administered 2016-10-09 – 2016-10-11 (×3): 1 g via INTRAVENOUS
  Filled 2016-10-09 (×4): qty 10

## 2016-10-09 MED ORDER — IOPAMIDOL (ISOVUE-300) INJECTION 61%
INTRAVENOUS | Status: AC
  Administered 2016-10-09: 30 mL via ORAL
  Filled 2016-10-09: qty 30

## 2016-10-09 MED ORDER — IOPAMIDOL (ISOVUE-300) INJECTION 61%
100.0000 mL | Freq: Once | INTRAVENOUS | Status: AC | PRN
  Administered 2016-10-09: 100 mL via INTRAVENOUS

## 2016-10-09 MED ORDER — AZITHROMYCIN 250 MG PO TABS
500.0000 mg | ORAL_TABLET | Freq: Every day | ORAL | Status: DC
  Administered 2016-10-09 – 2016-10-12 (×4): 500 mg via ORAL
  Filled 2016-10-09 (×4): qty 2

## 2016-10-09 MED ORDER — POTASSIUM CHLORIDE 10 MEQ/100ML IV SOLN
10.0000 meq | INTRAVENOUS | Status: AC
  Administered 2016-10-09 (×4): 10 meq via INTRAVENOUS
  Filled 2016-10-09: qty 100

## 2016-10-09 MED ORDER — POTASSIUM CHLORIDE CRYS ER 20 MEQ PO TBCR
40.0000 meq | EXTENDED_RELEASE_TABLET | Freq: Two times a day (BID) | ORAL | Status: DC
  Administered 2016-10-09 – 2016-10-10 (×4): 40 meq via ORAL
  Filled 2016-10-09 (×5): qty 2

## 2016-10-09 NOTE — Progress Notes (Signed)
  Pharmacy Antibiotic Note  [name] is a 76 y.o. female admitted on 10/06/2016 with sepsis. Abx now deescalating and Pharmacy has been consulted for Ceftriaxone dosing.  Plan: Ceftriaxone 1gm IV q24h Also, zithromax 500mg  po q24h Monitor labs, micro and vitals.    Height: 5\' 2"  (157.5 cm) Weight: 195 lb 4.8 oz (88.6 kg) IBW/kg (Calculated) : 50.1  Temp (24hrs), Avg:101 F (38.3 C), Min:98.4 F (36.9 C), Max:103.2 F (39.6 C)   Recent Labs Lab 10/06/16 1122 10/06/16 1127 10/06/16 1426 10/07/16 0535 10/07/16 1259 10/08/16 0501 10/09/16 0728 10/09/16 0732  WBC  --  14.4*  --  12.5*  --   --   --  9.3  CREATININE  --  1.01*  --  0.91 1.00 1.06* 0.83  --   LATICACIDVEN 0.73  --  0.79  --   --   --   --   --     Estimated Creatinine Clearance: 60.6 mL/min (by C-G formula based on SCr of 0.83 mg/dL).    No Known Allergies  Antimicrobials this admission: Vanc 5/2 >> 5/5 Zosyn 5/2 >> 5/5 Ceftriaxone 5/5>> Azithromycin 5/5>>  Dose adjustments this admission: n/a   Microbiology results: 5/2 BCx: ngtd 5/2 Influenza is negative  Thank you for allowing pharmacy to be a part of this patient's care.  Elder CyphersLorie Marylynn Rigdon, BS Pharm D, BCPS Clinical Pharmacist Pager [phone] 10/09/2016 2:28 PM

## 2016-10-09 NOTE — Progress Notes (Signed)
  Pharmacy Antibiotic Note  Doreen BeamBessie Y Lewan is a 76 y.o. female admitted on 10/06/2016 with sepsis.  Pharmacy has been consulted for Vancomycin and Zosyn dosing. Acute viral syndrome. Chest xray nl. Tmax 103.2, afebrile this AM, but still spiking fevers. No obvious source of infection. MD plans to continue abx for now.   Plan: Continue  Vancomycin 750mg  IV every 12 hours.  Goal trough 15-20 mcg/mL. Continue Zosyn 3.375g IV q8h (4 hour infusion). Monitor labs, micro and vitals.  Vancomycin trough ordered for 5/6 with AM labs  Height: 5\' 2"  (157.5 cm) Weight: 195 lb 4.8 oz (88.6 kg) IBW/kg (Calculated) : 50.1  Temp (24hrs), Avg:100.5 F (38.1 C), Min:98.4 F (36.9 C), Max:103.2 F (39.6 C)   Recent Labs Lab 10/06/16 1122 10/06/16 1127 10/06/16 1426 10/07/16 0535 10/07/16 1259 10/08/16 0501 10/09/16 0728 10/09/16 0732  WBC  --  14.4*  --  12.5*  --   --   --  9.3  CREATININE  --  1.01*  --  0.91 1.00 1.06* 0.83  --   LATICACIDVEN 0.73  --  0.79  --   --   --   --   --     Estimated Creatinine Clearance: 60.6 mL/min (by C-G formula based on SCr of 0.83 mg/dL).    No Known Allergies  Antimicrobials this admission: Vanc 5/2 >>  Zosyn 5/2 >>   Dose adjustments this admission: n/a   Microbiology results: 5/2 BCx: ngtd 5/2 Influenza is negative  Thank you for allowing pharmacy to be a part of this patient's care.  Elder CyphersLorie Shereka Lafortune, BS Pharm D, [place] Clinical Pharmacist Pager [phone] 10/09/2016 11:03 AM

## 2016-10-09 NOTE — Progress Notes (Signed)
CRITICAL VALUE ALERT  Critical value received:  K - 2.7  Date of notification:  10/09/16  Time of notification:  0811  Critical value read back:Yes.    Nurse who received alert:  Sherrye PayorM Ashir Kunz RN  MD notified (1st page):  Dr. Rinaldo RatelKadolph notified verbally  Time of first page:  0815  MD notified (2nd page):  Time of second page:  Responding MD:  Dr. Rinaldo RatelKadolph  Time MD responded:  [phone]

## 2016-10-09 NOTE — Progress Notes (Signed)
PROGRESS NOTE    Melanie BeamBessie Y Weekly  NWG:[id number] DOB: 04/25/1941 DOA: 10/06/2016 PCP: Benita StabileHall, John Z, MD    Brief Narrative:  [name] is an 76 y.o. female with hx of obesity, gout, DM, HTN, HLD, presented to the ER with 2 days hx of malaise, weakness, subjective fever, and some myalgia.  She did not have HA, sorethroat, SOB, coughs, and her GI and GU ROS was negative.  She has no recent travel, ill contacts, or started on any new meds.  She has no complaints of focal neurological symptoms. Work up in the ER included a clear CXR, negaitve UA, normal lactic acid, and leukocytosis with WBC of 14K.  Her temp was 102F.  She ateempted to ambulate, and felt weak and unable to walk, so hospitalist was asked to admit her for further evaluation.  She was started on IV Vancomycin and Zosyn and blood cultures were obtained. She had intermittent fevers from 5/3-5/4 as well as 5/4-5/5.  On 5/5 secondary to fevers and no source patient underwent CT scan of the chest/abd/pelvis with IV contrast.  She was found to have right multilobar pneumonia. She was transitioned from Zosyn and Vancomycin to Ceftriaxone and Azithromycin.   Assessment & Plan:   Principal Problem:   Acute viral syndrome Active Problems:   Weakness   Hypercholesteremia   Gout   Diabetes mellitus without complication (HCC)   Viral syndrome   Fever likely 2/2 pneumonia - BC cultures were obtained and currently are not showing any growth - continue antibiotics for time being - tylenol and ibuprofen for fever alternating  - Ceftriaxone and Azithro for fever  HLD - Continue with statin  DM - continue sliding scale insulin  HTN - Continue with home meds - restart diuretics when patient eating and drinking more    Gout - Continue with home meds.   DVT prophylaxis: Lovenox Code Status: FULL CODE.  Family Communication: Husband, son and female family member bedside  Disposition Plan:pending improvement of  fevers   Consultants:   CM  PT  OT  Procedures:   none  Antimicrobials:   Zosyn  Vancomycin    Subjective: Patient states she does not feel well today.  Has generalized muscle aches and feels very tired.  Reports that she hasn't gotten out of the bed much.  Discussed plan of care.  Objective: Vitals:   10/08/16 2030 10/09/16 0030 10/09/16 0642 10/09/16 1519  BP: (!) 142/47  (!) 155/84 (!) 156/71  Pulse: (!) 102  (!) 101 91  Resp: 18  18 18   Temp: (!) 103.2 F (39.6 C) (!) 101.3 F (38.5 C) 98.4 F (36.9 C) 99.3 F (37.4 C)  TempSrc: Oral Oral Axillary Oral  SpO2: 100%  100% 100%  Weight:      Height:        Intake/Output Summary (Last 24 hours) at 10/09/16 1611 Last data filed at 10/09/16 1300  Gross per 24 hour  Intake             2020 ml  Output                0 ml  Net             2020 ml   Filed Weights   10/06/16 1105 10/06/16 1821  Weight: 86.2 kg (190 lb) 88.6 kg (195 lb 4.8 oz)    Examination:  General exam: minimal distress Respiratory system: Respiratory effort normal. Scattered rales on right side throughout all  lung fields Cardiovascular system: S1 & S2 heard, RRR. No JVD, murmurs, rubs, gallops or clicks. No pedal edema. Gastrointestinal system: Abdomen is nondistended, soft and nontender. No organomegaly or masses felt. Normal bowel sounds heard. Central nervous system: Alert and oriented. No focal neurological deficits. Extremities: Symmetric 5 x 5 power but patient reports feeling weak and achey all over Skin: No rashes, lesions or ulcers Psychiatry: Mood & affect appropriate.     Data Reviewed: I have personally reviewed following labs and imaging studies  CBC:  Recent Labs Lab 10/06/16 1127 10/07/16 0535 10/09/16 0732  WBC 14.4* 12.5* 9.3  NEUTROABS 11.7*  --  7.6  HGB 12.1 11.2* 11.1*  HCT 36.4 33.7* 33.0*  MCV 89.0 89.4 88.5  PLT 222 198 178   Basic Metabolic Panel:  Recent Labs Lab 10/06/16 1127 10/07/16 0535  10/07/16 1259 10/08/16 0501 10/09/16 0728  NA 133* 132* 131* 136 134*  K 3.4* 2.8* 2.8* 3.3* 2.7*  CL 93* 96* 97* 99* 100*  CO2 30 26 27 29 25   GLUCOSE 132* 134* 158* 122* 107*  BUN 14 12 13 11 10   CREATININE 1.01* 0.91 1.00 1.06* 0.83  CALCIUM 9.5 8.5* 8.2* 8.6* 8.1*   GFR: Estimated Creatinine Clearance: 60.6 mL/min (by C-G formula based on SCr of 0.83 mg/dL). Liver Function Tests:  Recent Labs Lab 10/06/16 1127 10/07/16 0535  AST 13* 15  ALT 13* 14  ALKPHOS 86 73  BILITOT 0.8 0.9  PROT 6.9 6.2*  ALBUMIN 3.4* 2.9*   No results for input(s): LIPASE, AMYLASE in the last 168 hours. No results for input(s): AMMONIA in the last 168 hours. Coagulation Profile:  Recent Labs Lab 10/06/16 1127  INR 1.07   Cardiac Enzymes: No results for input(s): CKTOTAL, CKMB, CKMBINDEX, TROPONINI in the last 168 hours. BNP (last 3 results) No results for input(s): PROBNP in the last 8760 hours. HbA1C: No results for input(s): HGBA1C in the last 72 hours. CBG:  Recent Labs Lab 10/08/16 1200 10/08/16 1630 10/08/16 2029 10/09/16 0729 10/09/16 1125  GLUCAP 142* 122* 124* 95 126*   Lipid Profile: No results for input(s): CHOL, HDL, LDLCALC, TRIG, CHOLHDL, LDLDIRECT in the last 72 hours. Thyroid Function Tests: No results for input(s): TSH, T4TOTAL, FREET4, T3FREE, THYROIDAB in the last 72 hours. Anemia Panel: No results for input(s): VITAMINB12, FOLATE, FERRITIN, TIBC, IRON, RETICCTPCT in the last 72 hours. Sepsis Labs:  Recent Labs Lab 10/06/16 1122 10/06/16 1426  LATICACIDVEN 0.73 0.79    Recent Results (from the past 240 hour(s))  Culture, blood (Routine x 2)     Status: None (Preliminary result)   Collection Time: 10/06/16 11:30 AM  Result Value Ref Range Status   Specimen Description BLOOD LEFT ARM  Final   Special Requests Blood Culture adequate volume  Final   Culture NO GROWTH 3 DAYS  Final   Report Status PENDING  Incomplete  Culture, blood (Routine x 2)      Status: None (Preliminary result)   Collection Time: 10/06/16 11:40 AM  Result Value Ref Range Status   Specimen Description BLOOD LEFT ARM  Final   Special Requests   Final    BOTTLES DRAWN AEROBIC AND ANAEROBIC Blood Culture adequate volume   Culture NO GROWTH 3 DAYS  Final   Report Status PENDING  Incomplete         Radiology Studies: Ct Chest W Contrast  Result Date: 10/09/2016 CLINICAL DATA:  Persistent fever for 4 days, diabetes, hypertension, remote hysterectomy EXAM: CT  CHEST, ABDOMEN, AND PELVIS WITH CONTRAST TECHNIQUE: Multidetector CT imaging of the chest, abdomen and pelvis was performed following the standard protocol during bolus administration of intravenous contrast. CONTRAST:  ISOVUE-300 IOPAMIDOL from the but (ISOVUE-300) INJECTION 61%, <See Chart> ISOVUE-300 IOPAMIDOL (ISOVUE-300) INJECTION 61% COMPARISON:  10/06/2016 chest x-ray FINDINGS: CT CHEST FINDINGS Cardiovascular: Atherosclerosis of the thoracic aorta. Two vessel arch anatomy. Negative for aneurysm or dissection. Central pulmonary arteries appear patent. Normal heart size. No pericardial effusion. Mediastinum/Nodes: No supraclavicular axillary adenopathy. Mild right peritracheal and right infrahilar adenopathy with short axis measurements of 1.8 cm, image 18 and 1.3 cm, image 26. Suspect reactive. Trachea and esophagus are grossly within normal limits for age. Lungs/Pleura: Large area of dense airspace consolidation in the right upper lobe, right middle lobe, and also within the superior segment of the right lower lobe compatible with multilobar right lung pneumonia. There is an associated small right pleural effusion. Negative for pneumothorax. Minor atelectasis in the left lower lobe dependently and the lingula. Musculoskeletal: Degenerative spondylosis of the thoracic spine. No severe compression fracture. Sternum intact. No chest wall soft tissue abnormality or asymmetry. CT ABDOMEN PELVIS FINDINGS Hepatobiliary:  Small scattered hypodensities throughout the right hepatic lobe, suspect small hepatic cysts. No biliary dilatation or obstruction. Gallbladder and biliary system appear within normal limits for age. Hepatic portal veins are patent. Pancreas: Unremarkable. No pancreatic ductal dilatation or surrounding inflammatory changes. Spleen: Normal in size without focal abnormality. Adrenals/Urinary Tract: Normal adrenal glands. Kidneys demonstrate chronic perinephric strandy edema and scattered small cortical hypodense renal cysts. Largest cyst in the left kidney upper pole measures 2.8 cm, image 56. Negative for hydroureter or obstructing ureteral calculus. Bladder is underdistended but no gross abnormality. Stomach/Bowel: Negative for bowel obstruction, significant dilatation, ileus, or free air. Appendix is not visualized. Scattered diverticulosis, most pronounced in the left descending colon and sigmoid. No associated inflammatory process. Vascular/Lymphatic: Atherosclerosis noted of the aorta. 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 No adenopathy. Reproductive: Previous hysterectomy. No adnexal abnormality. Ovaries appear symmetric and unremarkable. Other: No abdominal wall hernia or abnormality. No abdominopelvic ascites. Musculoskeletal: Diffuse lumbar degenerative spondylosis and lower lumbar facet arthropathy. No acute compression fracture or wedge-shaped deformity. Degenerative changes of the pelvis and SI joints. No acute osseous finding. IMPRESSION: Extensive acute consolidative right lung airspace process most pronounced in the right upper lobe but also within the right middle lobe and superior segment right lower lobe compatible with right lung pneumonia. Right peritracheal and right infrahilar mild adenopathy, suspect reactive. Small associated right pleural effusion Thoracic and abdominal aortic atherosclerosis Incidental hepatic and renal cysts. 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 Diverticulosis without acute inflammatory process Remote hysterectomy No acute intra-abdominal or pelvic finding Electronically Signed   By: Judie Petit.  Shick M.D.   On: 10/09/2016 14:02   Ct Abdomen Pelvis W Contrast  Result Date: 10/09/2016 CLINICAL DATA:  Persistent fever for 4 days, diabetes, hypertension, remote hysterectomy EXAM: CT CHEST, ABDOMEN, AND PELVIS WITH CONTRAST TECHNIQUE: Multidetector CT imaging of the chest, abdomen and pelvis was performed following the standard protocol during bolus administration of intravenous contrast. CONTRAST:  ISOVUE-300 IOPAMIDOL from the but (ISOVUE-300) INJECTION 61%, <See Chart> ISOVUE-300 IOPAMIDOL (ISOVUE-300) INJECTION 61% COMPARISON:  10/06/2016 chest x-ray FINDINGS: CT CHEST FINDINGS Cardiovascular: Atherosclerosis of the thoracic aorta. Two vessel arch anatomy. Negative for aneurysm or dissection. Central pulmonary arteries appear patent. Normal heart size. No pericardial effusion. Mediastinum/Nodes: No supraclavicular axillary adenopathy. Mild right  peritracheal and right infrahilar adenopathy with short axis measurements of 1.8 cm, image 18 and 1.3 cm, image 26. Suspect reactive. Trachea and esophagus are grossly within normal limits for age. Lungs/Pleura: Large area of dense airspace consolidation in the right upper lobe, right middle lobe, and also within the superior segment of the right lower lobe compatible with multilobar right lung pneumonia. There is an associated small right pleural effusion. Negative for pneumothorax. Minor atelectasis in the left lower lobe dependently and the lingula. Musculoskeletal: Degenerative spondylosis of the thoracic spine. No severe compression fracture. Sternum intact. No chest wall soft tissue abnormality or asymmetry. CT ABDOMEN PELVIS FINDINGS Hepatobiliary: Small scattered hypodensities throughout the right hepatic lobe, suspect small hepatic cysts. No biliary dilatation or  obstruction. Gallbladder and biliary system appear within normal limits for age. Hepatic portal veins are patent. Pancreas: Unremarkable. No pancreatic ductal dilatation or surrounding inflammatory changes. Spleen: Normal in size without focal abnormality. Adrenals/Urinary Tract: Normal adrenal glands. Kidneys demonstrate chronic perinephric strandy edema and scattered small cortical hypodense renal cysts. Largest cyst in the left kidney upper pole measures 2.8 cm, image 56. Negative for hydroureter or obstructing ureteral calculus. Bladder is underdistended but no gross abnormality. Stomach/Bowel: Negative for bowel obstruction, significant dilatation, ileus, or free air. Appendix is not visualized. Scattered diverticulosis, most pronounced in the left descending colon and sigmoid. No associated inflammatory process. Vascular/Lymphatic: Atherosclerosis noted of the aorta. 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 No adenopathy. Reproductive: Previous hysterectomy. No adnexal abnormality. Ovaries appear symmetric and unremarkable. Other: No abdominal wall hernia or abnormality. No abdominopelvic ascites. Musculoskeletal: Diffuse lumbar degenerative spondylosis and lower lumbar facet arthropathy. No acute compression fracture or wedge-shaped deformity. Degenerative changes of the pelvis and SI joints. No acute osseous finding. IMPRESSION: Extensive acute consolidative right lung airspace process most pronounced in the right upper lobe but also within the right middle lobe and superior segment right lower lobe compatible with right lung pneumonia. Right peritracheal and right infrahilar mild adenopathy, suspect reactive. Small associated right pleural effusion Thoracic and abdominal aortic atherosclerosis Incidental hepatic and renal cysts. 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 Diverticulosis without acute inflammatory process Remote hysterectomy No acute  intra-abdominal or pelvic finding Electronically Signed   By: Judie Petit.  Shick M.D.   On: 10/09/2016 14:02        Scheduled Meds: . allopurinol  100 mg Oral Daily  . amLODipine  5 mg Oral Daily  . azithromycin  500 mg Oral Daily  . enoxaparin (LOVENOX) injection  40 mg Subcutaneous Q24H  . insulin aspart  0-15 Units Subcutaneous TID WC  . insulin aspart  0-5 Units Subcutaneous QHS  . losartan  100 mg Oral Daily  . pantoprazole  40 mg Oral Daily  . potassium chloride  40 mEq Oral BID  . simvastatin  40 mg Oral QPM  . sodium chloride flush  3 mL Intravenous Q12H   Continuous Infusions: . cefTRIAXone (ROCEPHIN)  IV       LOS: 2 days    Time spent: 30 minutes    Katrinka Blazing, MD [facility] Pager [phone]  If 7PM-7AM, please contact night-coverage [email or web address] Password [email or web address] 10/09/2016, 4:11 PM

## 2016-10-10 DIAGNOSIS — R5081 Fever presenting with conditions classified elsewhere: Secondary | ICD-10-CM

## 2016-10-10 DIAGNOSIS — J189 Pneumonia, unspecified organism: Secondary | ICD-10-CM

## 2016-10-10 LAB — GLUCOSE, CAPILLARY
Glucose-Capillary: 100 mg/dL — ABNORMAL HIGH (ref 65–99)
Glucose-Capillary: 121 mg/dL — ABNORMAL HIGH (ref 65–99)
Glucose-Capillary: 118 mg/dL — ABNORMAL HIGH (ref 65–99)
Glucose-Capillary: 150 mg/dL — ABNORMAL HIGH (ref 65–99)

## 2016-10-10 LAB — BASIC METABOLIC PANEL
ANION GAP: 8 (ref 5–15)
BUN: 9 mg/dL (ref 6–20)
CALCIUM: 8.4 mg/dL — AB (ref 8.9–10.3)
CO2: 28 mmol/L (ref 22–32)
Chloride: 99 mmol/L — ABNORMAL LOW (ref 101–111)
Creatinine, Ser: 0.92 mg/dL (ref 0.44–1.00)
GFR, EST NON AFRICAN AMERICAN: 59 mL/min — AB (ref >60)
Glucose, Bld: 115 mg/dL — ABNORMAL HIGH (ref 65–99)
Potassium: 3.8 mmol/L (ref 3.5–5.1)
SODIUM: 135 mmol/L (ref 135–145)

## 2016-10-10 MED ORDER — ACETAMINOPHEN 325 MG PO TABS
650.0000 mg | ORAL_TABLET | Freq: Four times a day (QID) | ORAL | Status: DC | PRN
  Administered 2016-10-10 – 2016-10-11 (×3): 650 mg via ORAL
  Filled 2016-10-10 (×4): qty 2

## 2016-10-10 NOTE — Progress Notes (Signed)
PROGRESS NOTE    [name]  ZOX:[id number] DOB: November 10, 1940 DOA: 10/06/2016 PCP: Benita StabileHall, John Z, MD    Brief Narrative:  Melanie BeamBessie Y Gucciardo is an 76 y.o. female with hx of obesity, gout, DM, HTN, HLD, presented to the ER with 2 days hx of malaise, weakness, subjective fever, and some myalgia.  She did not have HA, sorethroat, SOB, coughs, and her GI and GU ROS was negative.  She has no recent travel, ill contacts, or started on any new meds.  She has no complaints of focal neurological symptoms. Work up in the ER included a clear CXR, negaitve UA, normal lactic acid, and leukocytosis with WBC of 14K.  Her temp was 102F.  She ateempted to ambulate, and felt weak and unable to walk, so hospitalist was asked to admit her for further evaluation.  She was started on IV Vancomycin and Zosyn and blood cultures were obtained. She had intermittent fevers from 5/3-5/6.  On 5/5 secondary to fevers and no source patient underwent CT scan of the chest/abd/pelvis with IV contrast.  She was found to have right multilobar pneumonia. She was transitioned from Zosyn and Vancomycin to Ceftriaxone and Azithromycin.    Assessment & Plan:   Principal Problem:   Acute viral syndrome Active Problems:   Weakness   Hypercholesteremia   Gout   Diabetes mellitus without complication (HCC)   Viral syndrome   Fever likely 2/2 pneumonia - BC cultures NG x 3 days - tylenol and ibuprofen for fever alternating  - Ceftriaxone and Azithro - discussed on the phone with Dr. Orvan Falconerampbell of ID- may need to give time to defervesce, if still febrile within 48 hours consult pulmonology  HLD - Continue with statin  DM - continue sliding scale insulin  HTN - Continue with home meds - restart diuretics when patient eating and drinking more    Gout - Continue with home meds.   DVT prophylaxis: Lovenox Code Status: FULL CODE.  Family Communication: family friend bedside  Disposition Plan:pending improvement of  fevers   Consultants:   CM  PT  OT  ID over the phone  Procedures:   none  Antimicrobials:   Zosyn 5/3> 5/5  Vancomycin 5/3>5/5  Ceftriaxone 5/5>  Azithro 5/5>   Subjective: Patient initially asleep at time of exam and reports she feels a little better today upon waking up.  Has been using flutter valve and incentive spirometer throughout the day.  Objective: Vitals:   10/10/16 1130 10/10/16 1256 10/10/16 1349 10/10/16 1427  BP:    (!) 146/63  Pulse:    83  Resp:    18  Temp: (!) 100.5 F (38.1 C) (!) 100.8 F (38.2 C) 98.9 F (37.2 C) 99 F (37.2 C)  TempSrc: Oral Oral Oral Oral  SpO2:    95%  Weight:      Height:        Intake/Output Summary (Last 24 hours) at 10/10/16 1517 Last data filed at 10/09/16 1847  Gross per 24 hour  Intake              150 ml  Output                0 ml  Net              150 ml   Filed Weights   10/06/16 1105 10/06/16 1821  Weight: 86.2 kg (190 lb) 88.6 kg (195 lb 4.8 oz)    Examination:  General exam: minimal  distress Respiratory system: normal respiratory effort, no cough, scattered rales in posterior right lung fields Cardiovascular system: S1 & S2 heard, RRR. No JVD, murmurs, rubs, gallops or clicks. No pedal edema. Gastrointestinal system: Abdomen is obese, nondistended, soft and nontender. No organomegaly or masses felt. Normal bowel sounds heard. Central nervous system: Alert and oriented. No focal neurological deficits. Extremities: Symmetric 5 x 5 power, has ambulated to and from the bathroom without problem Skin: No rashes, lesions or ulcers Psychiatry: Mood & affect appropriate.     Data Reviewed: I have personally reviewed following labs and imaging studies  CBC:  Recent Labs Lab 10/06/16 1127 10/07/16 0535 10/09/16 0732  WBC 14.4* 12.5* 9.3  NEUTROABS 11.7*  --  7.6  HGB 12.1 11.2* 11.1*  HCT 36.4 33.7* 33.0*  MCV 89.0 89.4 88.5  PLT 222 198 178   Basic Metabolic Panel:  Recent  Labs Lab 10/07/16 0535 10/07/16 1259 10/08/16 0501 10/09/16 0728 10/10/16 0547  NA 132* 131* 136 134* 135  K 2.8* 2.8* 3.3* 2.7* 3.8  CL 96* 97* 99* 100* 99*  CO2 26 27 29 25 28   GLUCOSE 134* 158* 122* 107* 115*  BUN 12 13 11 10 9   CREATININE 0.91 1.00 1.06* 0.83 0.92  CALCIUM 8.5* 8.2* 8.6* 8.1* 8.4*   GFR: Estimated Creatinine Clearance: 54.6 mL/min (by C-G formula based on SCr of 0.92 mg/dL). Liver Function Tests:  Recent Labs Lab 10/06/16 1127 10/07/16 0535  AST 13* 15  ALT 13* 14  ALKPHOS 86 73  BILITOT 0.8 0.9  PROT 6.9 6.2*  ALBUMIN 3.4* 2.9*   No results for input(s): LIPASE, AMYLASE in the last 168 hours. No results for input(s): AMMONIA in the last 168 hours. Coagulation Profile:  Recent Labs Lab 10/06/16 1127  INR 1.07   Cardiac Enzymes: No results for input(s): CKTOTAL, CKMB, CKMBINDEX, TROPONINI in the last 168 hours. BNP (last 3 results) No results for input(s): PROBNP in the last 8760 hours. HbA1C: No results for input(s): HGBA1C in the last 72 hours. CBG:  Recent Labs Lab 10/09/16 1125 10/09/16 1704 10/09/16 2105 10/10/16 0730 10/10/16 1128  GLUCAP 126* 115* 139* 100* 121*   Lipid Profile: No results for input(s): CHOL, HDL, LDLCALC, TRIG, CHOLHDL, LDLDIRECT in the last 72 hours. Thyroid Function Tests: No results for input(s): TSH, T4TOTAL, FREET4, T3FREE, THYROIDAB in the last 72 hours. Anemia Panel: No results for input(s): VITAMINB12, FOLATE, FERRITIN, TIBC, IRON, RETICCTPCT in the last 72 hours. Sepsis Labs:  Recent Labs Lab 10/06/16 1122 10/06/16 1426  LATICACIDVEN 0.73 0.79    Recent Results (from the past 240 hour(s))  Culture, blood (Routine x 2)     Status: None (Preliminary result)   Collection Time: 10/06/16 11:30 AM  Result Value Ref Range Status   Specimen Description BLOOD LEFT ARM  Final   Special Requests Blood Culture adequate volume  Final   Culture NO GROWTH 3 DAYS  Final   Report Status PENDING   Incomplete  Culture, blood (Routine x 2)     Status: None (Preliminary result)   Collection Time: 10/06/16 11:40 AM  Result Value Ref Range Status   Specimen Description BLOOD LEFT ARM  Final   Special Requests   Final    BOTTLES DRAWN AEROBIC AND ANAEROBIC Blood Culture adequate volume   Culture NO GROWTH 3 DAYS  Final   Report Status PENDING  Incomplete         Radiology Studies: Ct Chest W Contrast  Result Date: 10/09/2016  CLINICAL DATA:  Persistent fever for 4 days, diabetes, hypertension, remote hysterectomy EXAM: CT CHEST, ABDOMEN, AND PELVIS WITH CONTRAST TECHNIQUE: Multidetector CT imaging of the chest, abdomen and pelvis was performed following the standard protocol during bolus administration of intravenous contrast. CONTRAST:  ISOVUE-300 IOPAMIDOL from the but (ISOVUE-300) INJECTION 61%, <See Chart> ISOVUE-300 IOPAMIDOL (ISOVUE-300) INJECTION 61% COMPARISON:  10/06/2016 chest x-ray FINDINGS: CT CHEST FINDINGS Cardiovascular: Atherosclerosis of the thoracic aorta. Two vessel arch anatomy. Negative for aneurysm or dissection. Central pulmonary arteries appear patent. Normal heart size. No pericardial effusion. Mediastinum/Nodes: No supraclavicular axillary adenopathy. Mild right peritracheal and right infrahilar adenopathy with short axis measurements of 1.8 cm, image 18 and 1.3 cm, image 26. Suspect reactive. Trachea and esophagus are grossly within normal limits for age. Lungs/Pleura: Large area of dense airspace consolidation in the right upper lobe, right middle lobe, and also within the superior segment of the right lower lobe compatible with multilobar right lung pneumonia. There is an associated small right pleural effusion. Negative for pneumothorax. Minor atelectasis in the left lower lobe dependently and the lingula. Musculoskeletal: Degenerative spondylosis of the thoracic spine. No severe compression fracture. Sternum intact. No chest wall soft tissue abnormality or  asymmetry. CT ABDOMEN PELVIS FINDINGS Hepatobiliary: Small scattered hypodensities throughout the right hepatic lobe, suspect small hepatic cysts. No biliary dilatation or obstruction. Gallbladder and biliary system appear within normal limits for age. Hepatic portal veins are patent. Pancreas: Unremarkable. No pancreatic ductal dilatation or surrounding inflammatory changes. Spleen: Normal in size without focal abnormality. Adrenals/Urinary Tract: Normal adrenal glands. Kidneys demonstrate chronic perinephric strandy edema and scattered small cortical hypodense renal cysts. Largest cyst in the left kidney upper pole measures 2.8 cm, image 56. Negative for hydroureter or obstructing ureteral calculus. Bladder is underdistended but no gross abnormality. Stomach/Bowel: Negative for bowel obstruction, significant dilatation, ileus, or free air. Appendix is not visualized. Scattered diverticulosis, most pronounced in the left descending colon and sigmoid. No associated inflammatory process. Vascular/Lymphatic: Atherosclerosis noted of the aorta. 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 No adenopathy. Reproductive: Previous hysterectomy. No adnexal abnormality. Ovaries appear symmetric and unremarkable. Other: No abdominal wall hernia or abnormality. No abdominopelvic ascites. Musculoskeletal: Diffuse lumbar degenerative spondylosis and lower lumbar facet arthropathy. No acute compression fracture or wedge-shaped deformity. Degenerative changes of the pelvis and SI joints. No acute osseous finding. IMPRESSION: Extensive acute consolidative right lung airspace process most pronounced in the right upper lobe but also within the right middle lobe and superior segment right lower lobe compatible with right lung pneumonia. Right peritracheal and right infrahilar mild adenopathy, suspect reactive. Small associated right pleural effusion Thoracic and abdominal aortic atherosclerosis  Incidental hepatic and renal cysts. 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 Diverticulosis without acute inflammatory process Remote hysterectomy No acute intra-abdominal or pelvic finding Electronically Signed   By: Judie Petit.  Shick M.D.   On: 10/09/2016 14:02   Ct Abdomen Pelvis W Contrast  Result Date: 10/09/2016 CLINICAL DATA:  Persistent fever for 4 days, diabetes, hypertension, remote hysterectomy EXAM: CT CHEST, ABDOMEN, AND PELVIS WITH CONTRAST TECHNIQUE: Multidetector CT imaging of the chest, abdomen and pelvis was performed following the standard protocol during bolus administration of intravenous contrast. CONTRAST:  ISOVUE-300 IOPAMIDOL from the but (ISOVUE-300) INJECTION 61%, <See Chart> ISOVUE-300 IOPAMIDOL (ISOVUE-300) INJECTION 61% COMPARISON:  10/06/2016 chest x-ray FINDINGS: CT CHEST FINDINGS Cardiovascular: Atherosclerosis of the thoracic aorta. Two vessel arch anatomy. Negative for aneurysm or dissection. Central pulmonary arteries appear  patent. Normal heart size. No pericardial effusion. Mediastinum/Nodes: No supraclavicular axillary adenopathy. Mild right peritracheal and right infrahilar adenopathy with short axis measurements of 1.8 cm, image 18 and 1.3 cm, image 26. Suspect reactive. Trachea and esophagus are grossly within normal limits for age. Lungs/Pleura: Large area of dense airspace consolidation in the right upper lobe, right middle lobe, and also within the superior segment of the right lower lobe compatible with multilobar right lung pneumonia. There is an associated small right pleural effusion. Negative for pneumothorax. Minor atelectasis in the left lower lobe dependently and the lingula. Musculoskeletal: Degenerative spondylosis of the thoracic spine. No severe compression fracture. Sternum intact. No chest wall soft tissue abnormality or asymmetry. CT ABDOMEN PELVIS FINDINGS Hepatobiliary: Small scattered hypodensities throughout the right hepatic lobe,  suspect small hepatic cysts. No biliary dilatation or obstruction. Gallbladder and biliary system appear within normal limits for age. Hepatic portal veins are patent. Pancreas: Unremarkable. No pancreatic ductal dilatation or surrounding inflammatory changes. Spleen: Normal in size without focal abnormality. Adrenals/Urinary Tract: Normal adrenal glands. Kidneys demonstrate chronic perinephric strandy edema and scattered small cortical hypodense renal cysts. Largest cyst in the left kidney upper pole measures 2.8 cm, image 56. Negative for hydroureter or obstructing ureteral calculus. Bladder is underdistended but no gross abnormality. Stomach/Bowel: Negative for bowel obstruction, significant dilatation, ileus, or free air. Appendix is not visualized. Scattered diverticulosis, most pronounced in the left descending colon and sigmoid. No associated inflammatory process. Vascular/Lymphatic: Atherosclerosis noted of the aorta. 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 No adenopathy. Reproductive: Previous hysterectomy. No adnexal abnormality. Ovaries appear symmetric and unremarkable. Other: No abdominal wall hernia or abnormality. No abdominopelvic ascites. Musculoskeletal: Diffuse lumbar degenerative spondylosis and lower lumbar facet arthropathy. No acute compression fracture or wedge-shaped deformity. Degenerative changes of the pelvis and SI joints. No acute osseous finding. IMPRESSION: Extensive acute consolidative right lung airspace process most pronounced in the right upper lobe but also within the right middle lobe and superior segment right lower lobe compatible with right lung pneumonia. Right peritracheal and right infrahilar mild adenopathy, suspect reactive. Small associated right pleural effusion Thoracic and abdominal aortic atherosclerosis Incidental hepatic and renal cysts. 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 Diverticulosis without  acute inflammatory process Remote hysterectomy No acute intra-abdominal or pelvic finding Electronically Signed   By: Judie Petit.  Shick M.D.   On: 10/09/2016 14:02        Scheduled Meds: . allopurinol  100 mg Oral Daily  . amLODipine  5 mg Oral Daily  . azithromycin  500 mg Oral Daily  . enoxaparin (LOVENOX) injection  40 mg Subcutaneous Q24H  . insulin aspart  0-15 Units Subcutaneous TID WC  . insulin aspart  0-5 Units Subcutaneous QHS  . losartan  100 mg Oral Daily  . pantoprazole  40 mg Oral Daily  . potassium chloride  40 mEq Oral BID  . simvastatin  40 mg Oral QPM  . sodium chloride flush  3 mL Intravenous Q12H   Continuous Infusions: . cefTRIAXone (ROCEPHIN)  IV Stopped (10/09/16 1811)     LOS: 3 days    Time spent: 30 minutes    Katrinka Blazing, MD [facility] Pager [phone]  If 7PM-7AM, please contact night-coverage [email or web address] Password [email or web address] 10/10/2016, 3:17 PM

## 2016-10-11 LAB — GLUCOSE, CAPILLARY
GLUCOSE-CAPILLARY: 95 mg/dL (ref 65–99)
GLUCOSE-CAPILLARY: 94 mg/dL (ref 65–99)
Glucose-Capillary: 105 mg/dL — ABNORMAL HIGH (ref 65–99)
Glucose-Capillary: 139 mg/dL — ABNORMAL HIGH (ref 65–99)

## 2016-10-11 LAB — CULTURE, BLOOD (ROUTINE X 2)
CULTURE: NO GROWTH
Culture: NO GROWTH
SPECIAL REQUESTS: ADEQUATE
Special Requests: ADEQUATE

## 2016-10-11 LAB — BASIC METABOLIC PANEL
Anion gap: 9 (ref 5–15)
BUN: 11 mg/dL (ref 6–20)
CALCIUM: 8.8 mg/dL — AB (ref 8.9–10.3)
CO2: 25 mmol/L (ref 22–32)
CREATININE: 0.83 mg/dL (ref 0.44–1.00)
Chloride: 102 mmol/L (ref 101–111)
GFR calc Af Amer: >60 mL/min (ref >60)
GFR calc non Af Amer: >60 mL/min (ref >60)
Glucose, Bld: 102 mg/dL — ABNORMAL HIGH (ref 65–99)
Potassium: 4.6 mmol/L (ref 3.5–5.1)
SODIUM: 136 mmol/L (ref 135–145)

## 2016-10-11 MED ORDER — GUAIFENESIN-DM 100-10 MG/5ML PO SYRP
5.0000 mL | ORAL_SOLUTION | ORAL | Status: DC | PRN
  Administered 2016-10-11 – 2016-10-12 (×2): 5 mL via ORAL
  Filled 2016-10-11 (×2): qty 5

## 2016-10-11 NOTE — Progress Notes (Signed)
PROGRESS NOTE    [name]  ZOX:[id number] DOB: Feb 10, 1941 DOA: 10/06/2016 PCP: Benita StabileHall, John Z, MD    Brief Narrative:  Melanie BeamBessie Y Black is an 76 y.o. female with hx of obesity, gout, DM, HTN, HLD, presented to the ER with 2 days hx of malaise, weakness, subjective fever, and some myalgia.  She did not have HA, sorethroat, SOB, coughs, and her GI and GU ROS was negative.  She has no recent travel, ill contacts, or started on any new meds.  She has no complaints of focal neurological symptoms. Work up in the ER included a clear CXR, negaitve UA, normal lactic acid, and leukocytosis with WBC of 14K.  Her temp was 102F.  She ateempted to ambulate, and felt weak and unable to walk, so hospitalist was asked to admit her for further evaluation.  She was started on IV Vancomycin and Zosyn and blood cultures were obtained. She had intermittent fevers from 5/3-5/6.  On 5/5 secondary to fevers and no source patient underwent CT scan of the chest/abd/pelvis with IV contrast.  She was found to have right multilobar pneumonia. She was transitioned from Zosyn and Vancomycin to Ceftriaxone and Azithromycin.  Patient continued to have intermittent fevers from 5/6-5/7 but the fevers were less than previously.   Assessment & Plan:   Principal Problem:   Acute viral syndrome Active Problems:   Weakness   Hypercholesteremia   Gout   Diabetes mellitus without complication (HCC)   Viral syndrome   Fever likely 2/2 pneumonia - BC cultures NG x 5 days - tylenol and ibuprofen for fever alternating  - continue Ceftriaxone and Azithro - discussed on the phone with Dr. Orvan Falconerampbell of ID- may need to give time to defervesce, if still febrile 24 hours consult pulmonology - if patient is eating most of her diet can transition to PO antibiotics later today or tomorrow  HLD - Continue with statin  DM - continue sliding scale insulin  HTN - Continue with home meds - restart diuretics when patient eating and  drinking more    Gout - Continue with home meds.   DVT prophylaxis: Lovenox Code Status: FULL CODE.  Family Communication: family friend bedside  Disposition Plan: likely discharge in 1-2 days   Consultants:   CM  PT  OT  ID over the phone  Procedures:   none  Antimicrobials:   Zosyn 5/3> 5/5  Vancomycin 5/3>5/5  Ceftriaxone 5/5>  Azithro 5/5>   Subjective: Patient sitting on side of the bed.  Says she feels slightly better today but is extremely weak.  Objective: Vitals:   10/11/16 0557 10/11/16 0730 10/11/16 0921 10/11/16 1211  BP:      Pulse:      Resp:      Temp: 98.6 F (37 C) 97.1 F (36.2 C) 97 F (36.1 C) 98.8 F (37.1 C)  TempSrc:  Oral Oral Oral  SpO2:      Weight:      Height:        Intake/Output Summary (Last 24 hours) at 10/11/16 1234 Last data filed at 10/10/16 1832  Gross per 24 hour  Intake               50 ml  Output                0 ml  Net               50 ml   Filed Weights   10/06/16 1105  10/06/16 1821  Weight: 86.2 kg (190 lb) 88.6 kg (195 lb 4.8 oz)    Examination:  General exam: slightly improved appearing, drowsy Respiratory system: normal respiratory effort, no cough, few rales in right lung Cardiovascular system: S1 & S2 heard, RRR. No JVD, murmurs, rubs, gallops or clicks. No pedal edema. Gastrointestinal system: Abdomen is obese, nondistended, soft and nontender. No organomegaly or masses felt. Normal bowel sounds heard. Central nervous system: Alert and oriented. No focal neurological deficits. Extremities: Symmetric 5 x 5 power, has ambulated to and from the bathroom without problem Skin: No rashes, lesions or ulcers Psychiatry: Mood & affect appropriate.     Data Reviewed: I have personally reviewed following labs and imaging studies  CBC:  Recent Labs Lab 10/06/16 1127 10/07/16 0535 10/09/16 0732  WBC 14.4* 12.5* 9.3  NEUTROABS 11.7*  --  7.6  HGB 12.1 11.2* 11.1*  HCT 36.4 33.7* 33.0*    MCV 89.0 89.4 88.5  PLT 222 198 178   Basic Metabolic Panel:  Recent Labs Lab 10/07/16 1259 10/08/16 0501 10/09/16 0728 10/10/16 0547 10/11/16 0443  NA 131* 136 134* 135 136  K 2.8* 3.3* 2.7* 3.8 4.6  CL 97* 99* 100* 99* 102  CO2 27 29 25 28 25   GLUCOSE 158* 122* 107* 115* 102*  BUN 13 11 10 9 11   CREATININE 1.00 1.06* 0.83 0.92 0.83  CALCIUM 8.2* 8.6* 8.1* 8.4* 8.8*   GFR: Estimated Creatinine Clearance: 60.6 mL/min (by C-G formula based on SCr of 0.83 mg/dL). Liver Function Tests:  Recent Labs Lab 10/06/16 1127 10/07/16 0535  AST 13* 15  ALT 13* 14  ALKPHOS 86 73  BILITOT 0.8 0.9  PROT 6.9 6.2*  ALBUMIN 3.4* 2.9*   No results for input(s): LIPASE, AMYLASE in the last 168 hours. No results for input(s): AMMONIA in the last 168 hours. Coagulation Profile:  Recent Labs Lab 10/06/16 1127  INR 1.07   Cardiac Enzymes: No results for input(s): CKTOTAL, CKMB, CKMBINDEX, TROPONINI in the last 168 hours. BNP (last 3 results) No results for input(s): PROBNP in the last 8760 hours. HbA1C: No results for input(s): HGBA1C in the last 72 hours. CBG:  Recent Labs Lab 10/10/16 1128 10/10/16 1623 10/10/16 2043 10/11/16 0725 10/11/16 1208  GLUCAP 121* 118* 150* 95 105*   Lipid Profile: No results for input(s): CHOL, HDL, LDLCALC, TRIG, CHOLHDL, LDLDIRECT in the last 72 hours. Thyroid Function Tests: No results for input(s): TSH, T4TOTAL, FREET4, T3FREE, THYROIDAB in the last 72 hours. Anemia Panel: No results for input(s): VITAMINB12, FOLATE, FERRITIN, TIBC, IRON, RETICCTPCT in the last 72 hours. Sepsis Labs:  Recent Labs Lab 10/06/16 1122 10/06/16 1426  LATICACIDVEN 0.73 0.79    Recent Results (from the past 240 hour(s))  Culture, blood (Routine x 2)     Status: None   Collection Time: 10/06/16 11:30 AM  Result Value Ref Range Status   Specimen Description BLOOD LEFT ARM  Final   Special Requests Blood Culture adequate volume  Final   Culture NO  GROWTH 5 DAYS  Final   Report Status 10/11/2016 FINAL  Final  Culture, blood (Routine x 2)     Status: None   Collection Time: 10/06/16 11:40 AM  Result Value Ref Range Status   Specimen Description BLOOD LEFT ARM  Final   Special Requests   Final    BOTTLES DRAWN AEROBIC AND ANAEROBIC Blood Culture adequate volume   Culture NO GROWTH 5 DAYS  Final   Report Status 10/11/2016  FINAL  Final         Radiology Studies: No results found.      Scheduled Meds: . allopurinol  100 mg Oral Daily  . amLODipine  5 mg Oral Daily  . azithromycin  500 mg Oral Daily  . enoxaparin (LOVENOX) injection  40 mg Subcutaneous Q24H  . insulin aspart  0-15 Units Subcutaneous TID WC  . insulin aspart  0-5 Units Subcutaneous QHS  . losartan  100 mg Oral Daily  . pantoprazole  40 mg Oral Daily  . simvastatin  40 mg Oral QPM  . sodium chloride flush  3 mL Intravenous Q12H   Continuous Infusions: . cefTRIAXone (ROCEPHIN)  IV Stopped (10/10/16 1738)     LOS: 4 days    Time spent: 25 minutes    Katrinka Blazing, MD [facility] Pager [phone]  If 7PM-7AM, please contact night-coverage [email or web address] Password [facility] 10/11/2016, 12:34 PM

## 2016-10-11 NOTE — Progress Notes (Signed)
Physical Therapy Treatment Patient Details Name: [name] MRN: [id number] DOB: 05/25/1941 Today's Date: 10/11/2016    History of Present Illness 76 y.o. female with hx of obesity, gout, DM, HTN, HLD, presented to the ER with 2 days hx of malaise, weakness, subjective fever, and some myalgia.  She did not have HA, sorethroat, SOB, coughs, and her GI and GU ROS was negative.  She has no recent travel, ill contacts, or started on any new meds.  She has no complaints of focal neurological symptoms. Work up in the ER included a clear CXR, negaitve UA, normal lactic acid, and leukocytosis with WBC of 14K.  Her temp was 102F.  She ateempted to ambulate, and felt weak and unable to walk, so hospitalist was asked to admit her for further evaluation.  Early sepsis possible, but unclear etiology.  She presented as if she has a viral syndrome.   Now DX: R multilobar PNA    PT Comments    Pt received in bed, and was agreeable to PT tx.  Pt continues to have difficulty with transfer supine<>sit, but is improving.  She was able to transfer with RW and Min guard to [facility], however when she was standing at the sink washing her hands, she became incontinent of bladder.  Pt was assisted getting washed off, and she was then able to ambulate 48ft with RW and min guard.  She continues to demonstrate decreased gait speed.  Left pt sitting up in the chair, and encouraged pt to increase her mobility throughout the day.  Continue to recommend HHPT upon d/c.     Follow Up Recommendations  Home health PT     Equipment Recommendations  None recommended by PT    Recommendations for Other Services       Precautions / Restrictions Precautions Precautions: Fall Precaution Comments: Pt initially states that she has not had any falls, but sister corrects her and states that she has had 2 falls in the past 6 months, where she "hit a curb and flipped over it."  However pt states that she did not sustain any injuries from  these falls.     Mobility  Bed Mobility Overal bed mobility: Needs Assistance Bed Mobility: Supine to Sit     Supine to sit: Min assist;HOB elevated     General bed mobility comments: increased time with cues for hand placement, and HOB completely raised.   Transfers Overall transfer level: Needs assistance Equipment used: Rolling walker (2 wheeled) Transfers: Sit to/from Stand Sit to Stand: Min guard         General transfer comment: vc's for hand placement.  Pt was able to transfer onto the [facility], and had a BM, but required assistance for peri-hygiene.  While pt was standing at the sink, she became incontinent of urine.    Ambulation/Gait Ambulation/Gait assistance: Min guard Ambulation Distance (Feet): 90 Feet Assistive device: Rolling walker (2 wheeled) Gait Pattern/deviations: Wide base of support;Decreased stride length;Step-to pattern;Antalgic         Stairs            Wheelchair Mobility    Modified Rankin (Stroke Patients Only)       Balance Overall balance assessment: History of Falls;Needs assistance Sitting-balance support: Bilateral upper extremity supported;Feet supported Sitting balance-Leahy Scale: Good       Standing balance-Leahy Scale: Fair  Cognition Arousal/Alertness: Awake/alert Behavior During Therapy: Flat affect Overall Cognitive Status: Within Functional Limits for tasks assessed                                        Exercises      General Comments        Pertinent Vitals/Pain Pain Assessment:  (pain with coughing)    Home Living                      Prior Function            PT Goals (current goals can now be found in the care plan section) Acute Rehab PT Goals Patient Stated Goal: Pt wants to get stronger PT Goal Formulation: With patient/family Time For Goal Achievement: 10/21/16 Potential to Achieve Goals: Fair Progress towards PT goals:  Progressing toward goals    Frequency    Min 3X/week      PT Plan Current plan remains appropriate    Co-evaluation              AM-PAC PT "6 Clicks" Daily Activity  Outcome Measure  Difficulty turning over in bed (including adjusting bedclothes, sheets and blankets)?: A Little Difficulty moving from lying on back to sitting on the side of the bed? : A Little Difficulty sitting down on and standing up from a chair with arms (e.g., wheelchair, bedside commode, etc,.)?: A Little Help needed moving to and from a bed to chair (including a wheelchair)?: A Little Help needed walking in hospital room?: A Little Help needed climbing 3-5 steps with a railing? : A Little 6 Click Score: 18    End of Session Equipment Utilized During Treatment: Gait belt Activity Tolerance: Patient limited by fatigue;Patient limited by lethargy Patient left: in bed;with call bell/phone within reach;with family/visitor present Nurse Communication: Mobility status PT Visit Diagnosis: Other symptoms and signs involving the nervous system (R29.898);Muscle weakness (generalized) (M62.81);Other abnormalities of gait and mobility (R26.89);History of falling (Z91.81)     Time: [id number] PT Time Calculation (min) (ACUTE ONLY): 36 min  Charges:  $Gait Training: 8-22 mins $Therapeutic Activity: 8-22 mins                    G Codes:       Beth Aoki Wedemeyer, PT, DPT X: [phone]

## 2016-10-12 DIAGNOSIS — J13 Pneumonia due to Streptococcus pneumoniae: Secondary | ICD-10-CM

## 2016-10-12 LAB — GLUCOSE, CAPILLARY
Glucose-Capillary: 104 mg/dL — ABNORMAL HIGH (ref 65–99)
Glucose-Capillary: 95 mg/dL (ref 65–99)

## 2016-10-12 MED ORDER — IBUPROFEN 600 MG PO TABS: 600.0000 mg | ORAL_TABLET | Freq: Four times a day (QID) | ORAL | 0 refills | Status: DC | PRN

## 2016-10-12 MED ORDER — GUAIFENESIN-DM 100-10 MG/5ML PO SYRP: 5.0000 mL | ORAL_SOLUTION | ORAL | 0 refills | Status: DC | PRN

## 2016-10-12 MED ORDER — CEFUROXIME AXETIL 500 MG PO TABS: 500.0000 mg | ORAL_TABLET | Freq: Two times a day (BID) | ORAL | 0 refills | Status: AC

## 2016-10-12 MED ORDER — AZITHROMYCIN 500 MG PO TABS: 500.0000 mg | ORAL_TABLET | Freq: Every day | ORAL | 0 refills | Status: AC

## 2016-10-12 NOTE — Care Management Note (Signed)
Case Management Note  Patient Details  Name: Melanie BeamBessie Y Black MRN: [id number] Date of Birth: 11-14-1940  Expected Discharge Date:      10/12/2016            Expected Discharge Plan:  Home w Home Health Services  In-House Referral:  NA  Discharge planning Services  CM Consult  Post Acute Care Choice:  Home Health Choice offered to:  Patient  DME Arranged:  3-N-1 DME Agency:  [facility]  HH Arranged[facility]  [facility]  [facility]  Status of Service:  Completed, signed off  If discussed at Long Length of Stay Meetings, dates discussed:  10/12/2016  Additional Comments: [facility] Pt aware HH has 48hrs to make first visit. Pt will also need 3 in 1, has chosen [facility] from list of DME providers. Alroy BailiffLinda Lothian, [facility], aware of DME referral and DC home today. She will obtain pt info from chart and will deliver DME to pt room prior to DC.   Malcolm Metrohildress, Jimya Ciani Demske, RN 10/12/2016, 9:39 AM

## 2016-10-12 NOTE — Care Management Important Message (Signed)
Important Message  Patient Details  Name: [name] MRN: [id number] Date of Birth: 1941-03-03   Medicare Important Message Given:  Yes    Malcolm MetroChildress, Camyah Pultz Demske, RN 10/12/2016, 9:38 AM

## 2016-10-12 NOTE — Discharge Summary (Signed)
Physician Discharge Summary  [name] ZOX:[id number] DOB: 1941-01-15 DOA: 10/06/2016  PCP: Benita StabileHall, Melanie Z, MD  Admit date: 10/06/2016 Discharge date: 10/13/2016  Admitted From:  Home Disposition:  Home   Recommendations for Outpatient Follow-up:  1. Follow up with PCP in 1-2 weeks 2. Please obtain BMP/CBC in one week 3. Take medications as prescribed 4. Alternate ibuprofen and tylenol for fevers   Home Health: Yes- PT/RN  Equipment/Devices: None   Discharge Condition: Stable CODE STATUS: Full code Diet recommendation: Heart Healthy  Brief/Interim Summary: Melanie LisBessie Y Gravesis an 76 y.o.femalewith hx of obesity, gout, DM, HTN, HLD, presented to the ER with 2 days hx of malaise, weakness, subjective fever, and some myalgia. She did not have HA, sorethroat, SOB, coughs, and her GI and GU ROS was negative. She has no recent travel, ill contacts, or started on any new meds. She has no complaints of focal neurological symptoms. Work up in the ER included a clear CXR, negaitve UA, normal lactic acid, and leukocytosis with WBC of 14K. Her temp was 102F. She ateempted to ambulate, and felt weak and unable to walk, so hospitalist was asked to admit her for further evaluation.  She was started on IV Vancomycin and Zosyn and blood cultures were obtained. She had intermittent fevers from 5/3-5/6.  On 5/5 secondary to fevers and no source patient underwent CT scan of the chest/abd/pelvis with IV contrast.  She was found to have right multilobar pneumonia. She was transitioned from Zosyn and Vancomycin to Ceftriaxone and Azithromycin.  Patient continued to have intermittent fevers from 5/6-5/7 but the fevers were less than previously.  She remained afebrile on 5/7-5/8 and was stable for discharge on 5/8.  [facility]  She received a 3 in 1 bedside commode.    Discharge Diagnoses:  Principal Problem:   Acute viral syndrome Active Problems:   Weakness  Hypercholesteremia   Gout   Diabetes mellitus without complication (HCC)   Viral syndrome    Discharge Instructions  Discharge Instructions    [facility]    Complete by:  As directed    Reason for consult:  post hospital discharge follow up from community RN   Diagnoses of:  Diabetes   Expected date of contact:  1-3 days (reserved for hospital discharges)   Please assign patient for community nurse to engage for transition of care calls and evaluate for monthly home visits. For questions please contact:   Janci Minor RN, BSN  [facility] Hospital Liaison [phone])   Call MD for:  difficulty breathing, headache or visual disturbances    Complete by:  As directed    Call MD for:  extreme fatigue    Complete by:  As directed    Call MD for:  hives    Complete by:  As directed    Call MD for:  persistant dizziness or light-headedness    Complete by:  As directed    Call MD for:  persistant nausea and vomiting    Complete by:  As directed    Call MD for:  severe uncontrolled pain    Complete by:  As directed    Call MD for:  temperature >100.4    Complete by:  As directed    Diet - low sodium heart healthy    Complete by:  As directed    Increase activity slowly    Complete by:  As directed  Allergies as of 10/12/2016   No Known Allergies     Medication List    STOP taking these medications   naproxen 500 MG tablet Commonly known as:  NAPROSYN     TAKE these medications   acetaminophen 325 MG tablet Commonly known as:  TYLENOL Take 650 mg by mouth every 6 (six) hours as needed.   allopurinol 100 MG tablet Commonly known as:  ZYLOPRIM Take 100 mg by mouth daily.   amLODipine 10 MG tablet Commonly known as:  NORVASC Take 5 mg by mouth daily.   azithromycin 500 MG tablet Commonly known as:  ZITHROMAX Take 1 tablet (500 mg total) by mouth daily.   cefUROXime 500 MG tablet Commonly known as:  CEFTIN Take 1 tablet (500 mg total) by  mouth 2 (two) times daily.   chlorthalidone 25 MG tablet Commonly known as:  HYGROTON Take 12.5 mg by mouth daily.   guaiFENesin-dextromethorphan 100-10 MG/5ML syrup Commonly known as:  ROBITUSSIN DM Take 5 mLs by mouth every 4 (four) hours as needed for cough.   ibuprofen 600 MG tablet Commonly known as:  ADVIL,MOTRIN Take 1 tablet (600 mg total) by mouth every 6 (six) hours as needed for fever.   losartan 100 MG tablet Commonly known as:  COZAAR Take 100 mg by mouth daily.   metFORMIN 1000 MG tablet Commonly known as:  GLUCOPHAGE Take 1,000 mg by mouth 2 (two) times daily with a meal.   pantoprazole 40 MG tablet Commonly known as:  PROTONIX Take 1 tablet (40 mg total) by mouth daily.   simvastatin 40 MG tablet Commonly known as:  ZOCOR Take 40 mg by mouth every evening.      Follow-up Information    [facility] Follow up.   Why:  HH PT and OT Contact information: [address] [place] [address] [phone]         No Known Allergies  Consultations:  CM   Procedures/Studies: Ct Chest W Contrast  Result Date: 10/09/2016 CLINICAL DATA:  Persistent fever for 4 days, diabetes, hypertension, remote hysterectomy EXAM: CT CHEST, ABDOMEN, AND PELVIS WITH CONTRAST TECHNIQUE: Multidetector CT imaging of the chest, abdomen and pelvis was performed following the standard protocol during bolus administration of intravenous contrast. CONTRAST:  ISOVUE-300 IOPAMIDOL from the but (ISOVUE-300) INJECTION 61%, <See Chart> ISOVUE-300 IOPAMIDOL (ISOVUE-300) INJECTION 61% COMPARISON:  10/06/2016 chest x-ray FINDINGS: CT CHEST FINDINGS Cardiovascular: Atherosclerosis of the thoracic aorta. Two vessel arch anatomy. Negative for aneurysm or dissection. Central pulmonary arteries appear patent. Normal heart size. No pericardial effusion. Mediastinum/Nodes: No supraclavicular axillary adenopathy. Mild right peritracheal and right infrahilar adenopathy  with short axis measurements of 1.8 cm, image 18 and 1.3 cm, image 26. Suspect reactive. Trachea and esophagus are grossly within normal limits for age. Lungs/Pleura: Large area of dense airspace consolidation in the right upper lobe, right middle lobe, and also within the superior segment of the right lower lobe compatible with multilobar right lung pneumonia. There is an associated small right pleural effusion. Negative for pneumothorax. Minor atelectasis in the left lower lobe dependently and the lingula. Musculoskeletal: Degenerative spondylosis of the thoracic spine. No severe compression fracture. Sternum intact. No chest wall soft tissue abnormality or asymmetry. CT ABDOMEN PELVIS FINDINGS Hepatobiliary: Small scattered hypodensities throughout the right hepatic lobe, suspect small hepatic cysts. No biliary dilatation or obstruction. Gallbladder and biliary system appear within normal limits for age. Hepatic portal veins are patent. Pancreas: Unremarkable. No pancreatic ductal dilatation  or surrounding inflammatory changes. Spleen: Normal in size without focal abnormality. Adrenals/Urinary Tract: Normal adrenal glands. Kidneys demonstrate chronic perinephric strandy edema and scattered small cortical hypodense renal cysts. Largest cyst in the left kidney upper pole measures 2.8 cm, image 56. Negative for hydroureter or obstructing ureteral calculus. Bladder is underdistended but no gross abnormality. Stomach/Bowel: Negative for bowel obstruction, significant dilatation, ileus, or free air. Appendix is not visualized. Scattered diverticulosis, most pronounced in the left descending colon and sigmoid. No associated inflammatory process. Vascular/Lymphatic: Atherosclerosis noted of the aorta. 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 No adenopathy. Reproductive: Previous hysterectomy. No adnexal abnormality. Ovaries appear symmetric and unremarkable. Other: No abdominal  wall hernia or abnormality. No abdominopelvic ascites. Musculoskeletal: Diffuse lumbar degenerative spondylosis and lower lumbar facet arthropathy. No acute compression fracture or wedge-shaped deformity. Degenerative changes of the pelvis and SI joints. No acute osseous finding. IMPRESSION: Extensive acute consolidative right lung airspace process most pronounced in the right upper lobe but also within the right middle lobe and superior segment right lower lobe compatible with right lung pneumonia. Right peritracheal and right infrahilar mild adenopathy, suspect reactive. Small associated right pleural effusion Thoracic and abdominal aortic atherosclerosis Incidental hepatic and renal cysts. 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 Diverticulosis without acute inflammatory process Remote hysterectomy No acute intra-abdominal or pelvic finding Electronically Signed   By: Judie Petit.  Shick M.D.   On: 10/09/2016 14:02   Ct Abdomen Pelvis W Contrast  Result Date: 10/09/2016 CLINICAL DATA:  Persistent fever for 4 days, diabetes, hypertension, remote hysterectomy EXAM: CT CHEST, ABDOMEN, AND PELVIS WITH CONTRAST TECHNIQUE: Multidetector CT imaging of the chest, abdomen and pelvis was performed following the standard protocol during bolus administration of intravenous contrast. CONTRAST:  ISOVUE-300 IOPAMIDOL from the but (ISOVUE-300) INJECTION 61%, <See Chart> ISOVUE-300 IOPAMIDOL (ISOVUE-300) INJECTION 61% COMPARISON:  10/06/2016 chest x-ray FINDINGS: CT CHEST FINDINGS Cardiovascular: Atherosclerosis of the thoracic aorta. Two vessel arch anatomy. Negative for aneurysm or dissection. Central pulmonary arteries appear patent. Normal heart size. No pericardial effusion. Mediastinum/Nodes: No supraclavicular axillary adenopathy. Mild right peritracheal and right infrahilar adenopathy with short axis measurements of 1.8 cm, image 18 and 1.3 cm, image 26. Suspect reactive. Trachea and esophagus are grossly  within normal limits for age. Lungs/Pleura: Large area of dense airspace consolidation in the right upper lobe, right middle lobe, and also within the superior segment of the right lower lobe compatible with multilobar right lung pneumonia. There is an associated small right pleural effusion. Negative for pneumothorax. Minor atelectasis in the left lower lobe dependently and the lingula. Musculoskeletal: Degenerative spondylosis of the thoracic spine. No severe compression fracture. Sternum intact. No chest wall soft tissue abnormality or asymmetry. CT ABDOMEN PELVIS FINDINGS Hepatobiliary: Small scattered hypodensities throughout the right hepatic lobe, suspect small hepatic cysts. No biliary dilatation or obstruction. Gallbladder and biliary system appear within normal limits for age. Hepatic portal veins are patent. Pancreas: Unremarkable. No pancreatic ductal dilatation or surrounding inflammatory changes. Spleen: Normal in size without focal abnormality. Adrenals/Urinary Tract: Normal adrenal glands. Kidneys demonstrate chronic perinephric strandy edema and scattered small cortical hypodense renal cysts. Largest cyst in the left kidney upper pole measures 2.8 cm, image 56. Negative for hydroureter or obstructing ureteral calculus. Bladder is underdistended but no gross abnormality. Stomach/Bowel: Negative for bowel obstruction, significant dilatation, ileus, or free air. Appendix is not visualized. Scattered diverticulosis, most pronounced in the left descending colon and sigmoid. No associated inflammatory process. Vascular/Lymphatic: Atherosclerosis  noted of the aorta. 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 No adenopathy. Reproductive: Previous hysterectomy. No adnexal abnormality. Ovaries appear symmetric and unremarkable. Other: No abdominal wall hernia or abnormality. No abdominopelvic ascites. Musculoskeletal: Diffuse lumbar degenerative spondylosis and lower  lumbar facet arthropathy. No acute compression fracture or wedge-shaped deformity. Degenerative changes of the pelvis and SI joints. No acute osseous finding. IMPRESSION: Extensive acute consolidative right lung airspace process most pronounced in the right upper lobe but also within the right middle lobe and superior segment right lower lobe compatible with right lung pneumonia. Right peritracheal and right infrahilar mild adenopathy, suspect reactive. Small associated right pleural effusion Thoracic and abdominal aortic atherosclerosis Incidental hepatic and renal cysts. 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 Diverticulosis without acute inflammatory process Remote hysterectomy No acute intra-abdominal or pelvic finding Electronically Signed   By: Judie Petit.  Shick M.D.   On: 10/09/2016 14:02   Dg Chest Port 1 View  Result Date: 10/06/2016 CLINICAL DATA:  Weakness, cough and fever. History of hypertension, hypercholesterolemia and diabetes. EXAM: PORTABLE CHEST 1 VIEW COMPARISON:  Chest radiograph September 11, 2016 FINDINGS: Cardiac silhouette is normal in size. Tortuous, possibly ectatic aorta. No pleural effusion or focal consolidation. No pneumothorax. Soft tissue planes and included osseous structures nonsuspicious. IMPRESSION: Stable examination:  No acute cardiopulmonary process. Electronically Signed   By: Awilda Metro M.D.   On: 10/06/2016 13:38       Subjective: Patient reports she feels better today.  States she feels less weak and has eaten breakfast without concern.  All patient and family questions were answered.  Discharge Exam: Vitals:   10/11/16 2045 10/12/16 0500  BP: (!) 141/75 139/86  Pulse: 78 75  Resp: 18 18  Temp: 98.3 F (36.8 C) 97.8 F (36.6 C)   Vitals:   10/11/16 1211 10/11/16 1451 10/11/16 2045 10/12/16 0500  BP:  (!) 154/72 (!) 141/75 139/86  Pulse:  84 78 75  Resp:  18 18 18   Temp: 98.8 F (37.1 C) 99.9 F (37.7 C) 98.3 F (36.8 C) 97.8 F  (36.6 C)  TempSrc: Oral Oral Oral Oral  SpO2:  95% 96% 93%  Weight:      Height:        General: Pt is alert, awake, not in acute distress Cardiovascular: RRR, S1/S2 +, no rubs, no gallops Respiratory: scattered rales on the right side Abdominal: Soft, NT, ND, bowel sounds + Extremities: no edema, no cyanosis    The results of significant diagnostics from this hospitalization (including imaging, microbiology, ancillary and laboratory) are listed below for reference.     Microbiology: Recent Results (from the past 240 hour(s))  Culture, blood (Routine x 2)     Status: None   Collection Time: 10/06/16 11:30 AM  Result Value Ref Range Status   Specimen Description BLOOD LEFT ARM  Final   Special Requests Blood Culture adequate volume  Final   Culture NO GROWTH 5 DAYS  Final   Report Status 10/11/2016 FINAL  Final  Culture, blood (Routine x 2)     Status: None   Collection Time: 10/06/16 11:40 AM  Result Value Ref Range Status   Specimen Description BLOOD LEFT ARM  Final   Special Requests   Final    BOTTLES DRAWN AEROBIC AND ANAEROBIC Blood Culture adequate volume   Culture NO GROWTH 5 DAYS  Final   Report Status 10/11/2016 FINAL  Final     Labs: BNP (last 3  results) No results for input(s): BNP in the last 8760 hours. Basic Metabolic Panel:  Recent Labs Lab 10/07/16 1259 10/08/16 0501 10/09/16 0728 10/10/16 0547 10/11/16 0443  NA 131* 136 134* 135 136  K 2.8* 3.3* 2.7* 3.8 4.6  CL 97* 99* 100* 99* 102  CO2 27 29 25 28 25   GLUCOSE 158* 122* 107* 115* 102*  BUN 13 11 10 9 11   CREATININE 1.00 1.06* 0.83 0.92 0.83  CALCIUM 8.2* 8.6* 8.1* 8.4* 8.8*   Liver Function Tests:  Recent Labs Lab 10/07/16 0535  AST 15  ALT 14  ALKPHOS 73  BILITOT 0.9  PROT 6.2*  ALBUMIN 2.9*   No results for input(s): LIPASE, AMYLASE in the last 168 hours. No results for input(s): AMMONIA in the last 168 hours. CBC:  Recent Labs Lab 10/07/16 0535 10/09/16 0732  WBC  12.5* 9.3  NEUTROABS  --  7.6  HGB 11.2* 11.1*  HCT 33.7* 33.0*  MCV 89.4 88.5  PLT 198 178   Cardiac Enzymes: No results for input(s): CKTOTAL, CKMB, CKMBINDEX, TROPONINI in the last 168 hours. BNP: Invalid input(s): POCBNP CBG:  Recent Labs Lab 10/11/16 1208 10/11/16 1657 10/11/16 2116 10/12/16 0741 10/12/16 1200  GLUCAP 105* 139* 94 104* 95   D-Dimer No results for input(s): DDIMER in the last 72 hours. Hgb A1c No results for input(s): HGBA1C in the last 72 hours. Lipid Profile No results for input(s): CHOL, HDL, LDLCALC, TRIG, CHOLHDL, LDLDIRECT in the last 72 hours. Thyroid function studies No results for input(s): TSH, T4TOTAL, T3FREE, THYROIDAB in the last 72 hours.  Invalid input(s): FREET3 Anemia work up No results for input(s): VITAMINB12, FOLATE, FERRITIN, TIBC, IRON, RETICCTPCT in the last 72 hours. Urinalysis    Component Value Date/Time   COLORURINE YELLOW 10/06/2016 1112   APPEARANCEUR CLEAR 10/06/2016 1112   LABSPEC 1.021 10/06/2016 1112   PHURINE 6.0 10/06/2016 1112   GLUCOSEU NEGATIVE 10/06/2016 1112   HGBUR NEGATIVE 10/06/2016 1112   HGBUR negative 10/17/2008 1020   BILIRUBINUR NEGATIVE 10/06/2016 1112   KETONESUR NEGATIVE 10/06/2016 1112   PROTEINUR 100 (A) 10/06/2016 1112   UROBILINOGEN 1.0 10/17/2008 1020   NITRITE NEGATIVE 10/06/2016 1112   LEUKOCYTESUR NEGATIVE 10/06/2016 1112   Sepsis Labs Invalid input(s): PROCALCITONIN,  WBC,  LACTICIDVEN Microbiology Recent Results (from the past 240 hour(s))  Culture, blood (Routine x 2)     Status: None   Collection Time: 10/06/16 11:30 AM  Result Value Ref Range Status   Specimen Description BLOOD LEFT ARM  Final   Special Requests Blood Culture adequate volume  Final   Culture NO GROWTH 5 DAYS  Final   Report Status 10/11/2016 FINAL  Final  Culture, blood (Routine x 2)     Status: None   Collection Time: 10/06/16 11:40 AM  Result Value Ref Range Status   Specimen Description BLOOD LEFT  ARM  Final   Special Requests   Final    BOTTLES DRAWN AEROBIC AND ANAEROBIC Blood Culture adequate volume   Culture NO GROWTH 5 DAYS  Final   Report Status 10/11/2016 FINAL  Final     Time coordinating discharge: 45 minutes  SIGNED:   Katrinka Blazing, MD  [facility] 10/13/2016, 11:51 AM Pager [phone] If 7PM-7AM, please contact night-coverage [email or web address] Password [email or web address]

## 2016-10-12 NOTE — Progress Notes (Signed)
Pt IV removed per NT, tolerated well.  Reviewed discharge instructions with pt and family at bedside.  Answered questions at this time.

## 2016-10-14 ENCOUNTER — Telehealth: Payer: Self-pay | Admitting: *Deleted

## 2016-10-14 ENCOUNTER — Ambulatory Visit: Payer: Self-pay

## 2016-10-14 DIAGNOSIS — B349 Viral infection, unspecified: Secondary | ICD-10-CM | POA: Diagnosis not present

## 2016-10-14 DIAGNOSIS — E119 Type 2 diabetes mellitus without complications: Secondary | ICD-10-CM | POA: Diagnosis not present

## 2016-10-14 DIAGNOSIS — E876 Hypokalemia: Secondary | ICD-10-CM | POA: Diagnosis not present

## 2016-10-14 DIAGNOSIS — I10 Essential (primary) hypertension: Secondary | ICD-10-CM | POA: Diagnosis not present

## 2016-10-14 DIAGNOSIS — M109 Gout, unspecified: Secondary | ICD-10-CM | POA: Diagnosis not present

## 2016-10-14 DIAGNOSIS — M1990 Unspecified osteoarthritis, unspecified site: Secondary | ICD-10-CM | POA: Diagnosis not present

## 2016-10-14 DIAGNOSIS — E785 Hyperlipidemia, unspecified: Secondary | ICD-10-CM | POA: Diagnosis not present

## 2016-10-14 DIAGNOSIS — E782 Mixed hyperlipidemia: Secondary | ICD-10-CM | POA: Diagnosis not present

## 2016-10-14 DIAGNOSIS — J189 Pneumonia, unspecified organism: Secondary | ICD-10-CM | POA: Diagnosis not present

## 2016-10-14 DIAGNOSIS — M6281 Muscle weakness (generalized): Secondary | ICD-10-CM | POA: Diagnosis not present

## 2016-10-14 DIAGNOSIS — E669 Obesity, unspecified: Secondary | ICD-10-CM | POA: Diagnosis not present

## 2016-10-14 DIAGNOSIS — E1165 Type 2 diabetes mellitus with hyperglycemia: Secondary | ICD-10-CM | POA: Diagnosis not present

## 2016-10-14 DIAGNOSIS — Z7984 Long term (current) use of oral hypoglycemic drugs: Secondary | ICD-10-CM | POA: Diagnosis not present

## 2016-10-14 NOTE — Patient Outreach (Signed)
[facility] Care Management  10/14/2016  Melanie BeamBessie Y Black [birth year] [id number]  Care coordination  [facility] but was still in  hospital until 10/13/16 with review of EPIC notes  on 10/11/16 and 10/12/16  [facility] poke with daughter [name] and pt is sleeping Informed CM first pcp appt obtained was on 10/27/16 with Dr Margo AyeHall  CM to see if earlier appointntment available Left CM number   Plan call pcp office for possible earlier appt pend call from pt to complete TOC assessment  Rosi Secrist L. Noelle PennerGibbs, RN, BSN, CCM [facility] Care [facility] [phone]

## 2016-10-15 ENCOUNTER — Other Ambulatory Visit: Payer: Self-pay | Admitting: *Deleted

## 2016-10-15 NOTE — Patient Outreach (Addendum)
[facility](THN) Care Management  10/15/2016  Melanie BeamBessie Y Black [birth year] [id number]  Care Coordination  [facility] on 10[facility]11[facility]16 and [name] discharged on 10/13/16 She was called on 10/14/17 for transition of care calls and evaluate for monthly home visits  [facility] called Dr Margo AyeHall office with attempt to discuss getting a follow up appointment for [name] after d/c from hospital earlier than 10/27/16 at 0953, [facility] CM sent a test message to Dr Margo AyeHall office and was informed via text that [facility] CM would receive a return call  10/15/16 at 1036 CM received a return call from Mia from Dr Margo AyeHall office to get assist with an appointment on 10/22/16 at 1420 with Toni Amendourtney PA  [name] called while [facility] CM was speaking with Mia  [facility] CM returned a call to [name] at 1042 to updated her that she has an earlier pcp follow up appointment scheduled for 10/22/16 at 2:20 pm.  CM informed [name] that CM would call her later in the day to follow up with her more [facility] CM called and spoke with [name] at 1658 to further complete TOC assessment.   Admit date: 10/06/2016 Discharge date: 10/13/2016 home with her family after hospital diagnoses of Acute viral syndrome, Weakness, Hypercholesteremia, Gout, Diabetes mellitus without complication Recommendations for Outpatient Follow-up:  Follow up with PCP in 1-2 weeks, Please obtain BMP/CBC in one week, Take medications as prescribed, Alternate ibuprofen and tylenol for fevers Home Health: Yes- PT/RN  DME Arranged:  3-N-1 HH Agency:  [facility]  Discharge Condition: Stable with Diet recommendation: Heart Healthy Reports having a poor appetite related to her thrush but drinking water regularly to prevent dehydration  Acute medical conditions  She reports she took her last antibiotic, Ceftin, today and was awaiting a call from Dr Margo AyeHall office to get a medicine to assist with thrush as identified by her home health  nurse.  Reports calling but found the office to be closed   chronic medical conditions (DM, HTN) She reports only having Dr Margo AyeHall and an orthopedic MD (Knee pain) as medical providers Dr Margo AyeHall follow her for DM and HTN Diabetes is being managed with metformin 1000 mg bid Report her cbg was 93 this am and 101 yesterday take bid She voices concern and precaution of taking metformin when her cbg is low to prevent further hypoglycemia Her Temperature was 98.6 orally today as confirmed by her husband.    Support systems include her husband, son and daughter  Plans THN Completed TOC assessment THN Cm offered interventions of comfort of simple home remedies to [name] to include rinsing her mouth with salt water to include small amount of salt, baking soda or apple cider vinegar as tolerated THN CM educated her on thrush as yeast infection that develops inside your mouth and on your tongue as an overgrowth on normal bacteria present when the immune system may be deficient She voiced understanding She is also aware of the pcp urgent care center being available to her on Saturday Discussed some options of prescription medications that may be offered to her like magic mouthwash THN CM sent a text as informed [name] to Dr Margo AyeHall text office number informing them that [name] needs medicine called in to her [facility] CM will follow up with [name] at her 10/22/16 2:20 pm appointment with Toni Amendourtney NP/PA at Dr Margo AyeHall office  [facility] CM routed note to  Dr Margo Aye  [facility] CM Care Plan Problem One     Most Recent Value  Care Plan Problem One  Knowledge deficit related to self-management of diabetes.  Role Documenting the Problem One  Health Coach  Care Plan for Problem One  Active  THN Long Term Goal (31-90 days)  over the next 31 days patient will voice understanding of home management of diabetes and other symptoms related to Dm (thrush, etc)   THN Long Term Goal Start Date  10/15/16  Interventions for Problem  One Long Term Goal  assess for DM needs  and educate patient   THN CM Short Term Goal #1 (0-30 days)  Over the next 30 days patient will be able to demonstrate with evidence by cbg values management of DM   THN CM Short Term Goal #1 Start Date  10/15/16  Interventions for Short Term Goal #1  assess DM needs, educate on DM home care, educate on thrush home care  THN CM Short Term Goal #2 (0-30 days)  over the next 7-14 days patient will have follow up care with pcp and be able to manage thrush symptoms at home   [facility] CM Short Term Goal #2 Start Date  10/15/16  Interventions for Short Term Goal #2  contact pcp office to assist with follow up hospital appointment and rx for thrush medicine, offer comfort home measures for thrush,        Juanmiguel Defelice L. Noelle Penner, RN, BSN, CCM [facility] Care Management [phone]

## 2016-10-18 DIAGNOSIS — M6281 Muscle weakness (generalized): Secondary | ICD-10-CM | POA: Diagnosis not present

## 2016-10-18 DIAGNOSIS — E119 Type 2 diabetes mellitus without complications: Secondary | ICD-10-CM | POA: Diagnosis not present

## 2016-10-18 DIAGNOSIS — Z7984 Long term (current) use of oral hypoglycemic drugs: Secondary | ICD-10-CM | POA: Diagnosis not present

## 2016-10-18 DIAGNOSIS — I10 Essential (primary) hypertension: Secondary | ICD-10-CM | POA: Diagnosis not present

## 2016-10-18 DIAGNOSIS — J189 Pneumonia, unspecified organism: Secondary | ICD-10-CM | POA: Diagnosis not present

## 2016-10-18 DIAGNOSIS — E785 Hyperlipidemia, unspecified: Secondary | ICD-10-CM | POA: Diagnosis not present

## 2016-10-18 DIAGNOSIS — M109 Gout, unspecified: Secondary | ICD-10-CM | POA: Diagnosis not present

## 2016-10-18 DIAGNOSIS — B349 Viral infection, unspecified: Secondary | ICD-10-CM | POA: Diagnosis not present

## 2016-10-18 DIAGNOSIS — E669 Obesity, unspecified: Secondary | ICD-10-CM | POA: Diagnosis not present

## 2016-10-19 DIAGNOSIS — B349 Viral infection, unspecified: Secondary | ICD-10-CM | POA: Diagnosis not present

## 2016-10-19 DIAGNOSIS — E785 Hyperlipidemia, unspecified: Secondary | ICD-10-CM | POA: Diagnosis not present

## 2016-10-19 DIAGNOSIS — M109 Gout, unspecified: Secondary | ICD-10-CM | POA: Diagnosis not present

## 2016-10-19 DIAGNOSIS — I10 Essential (primary) hypertension: Secondary | ICD-10-CM | POA: Diagnosis not present

## 2016-10-19 DIAGNOSIS — J189 Pneumonia, unspecified organism: Secondary | ICD-10-CM | POA: Diagnosis not present

## 2016-10-19 DIAGNOSIS — E669 Obesity, unspecified: Secondary | ICD-10-CM | POA: Diagnosis not present

## 2016-10-19 DIAGNOSIS — Z7984 Long term (current) use of oral hypoglycemic drugs: Secondary | ICD-10-CM | POA: Diagnosis not present

## 2016-10-19 DIAGNOSIS — E119 Type 2 diabetes mellitus without complications: Secondary | ICD-10-CM | POA: Diagnosis not present

## 2016-10-19 DIAGNOSIS — M6281 Muscle weakness (generalized): Secondary | ICD-10-CM | POA: Diagnosis not present

## 2016-10-21 DIAGNOSIS — B349 Viral infection, unspecified: Secondary | ICD-10-CM | POA: Diagnosis not present

## 2016-10-21 DIAGNOSIS — E669 Obesity, unspecified: Secondary | ICD-10-CM | POA: Diagnosis not present

## 2016-10-21 DIAGNOSIS — M109 Gout, unspecified: Secondary | ICD-10-CM | POA: Diagnosis not present

## 2016-10-21 DIAGNOSIS — J189 Pneumonia, unspecified organism: Secondary | ICD-10-CM | POA: Diagnosis not present

## 2016-10-21 DIAGNOSIS — M6281 Muscle weakness (generalized): Secondary | ICD-10-CM | POA: Diagnosis not present

## 2016-10-21 DIAGNOSIS — I10 Essential (primary) hypertension: Secondary | ICD-10-CM | POA: Diagnosis not present

## 2016-10-21 DIAGNOSIS — Z7984 Long term (current) use of oral hypoglycemic drugs: Secondary | ICD-10-CM | POA: Diagnosis not present

## 2016-10-21 DIAGNOSIS — E785 Hyperlipidemia, unspecified: Secondary | ICD-10-CM | POA: Diagnosis not present

## 2016-10-21 DIAGNOSIS — E119 Type 2 diabetes mellitus without complications: Secondary | ICD-10-CM | POA: Diagnosis not present

## 2016-10-22 ENCOUNTER — Other Ambulatory Visit: Payer: Self-pay | Admitting: *Deleted

## 2016-10-22 DIAGNOSIS — J159 Unspecified bacterial pneumonia: Secondary | ICD-10-CM | POA: Diagnosis not present

## 2016-10-22 DIAGNOSIS — J180 Bronchopneumonia, unspecified organism: Secondary | ICD-10-CM | POA: Diagnosis not present

## 2016-10-22 DIAGNOSIS — I10 Essential (primary) hypertension: Secondary | ICD-10-CM | POA: Diagnosis not present

## 2016-10-22 NOTE — Patient Outreach (Signed)
[facility]) Care Management   10/22/2016  [name] August 20, 1940 [id number]  [name] is an 76 y.o. female hx of obesity, gout, DM, HTN, HLD, presented to the ER with 2 days hx of malaise, weakness, subjective fever, and some myalgia.  Work up in the ER included a clear CXR, negaitve UA, normal lactic acid, and leukocytosis with WBC of 14K.  Her temp was 102F.   Admitted for acute viral syndrome on 10/06/16 and discharged on 10/13/16 [facility] RN, PT/OT and a 3n1 on a heart healthy diet and activity as tolerated Pt referred to [place] on 10/11/16 byt [facility] hospital liaison but was still in  hospital until 10/13/16 with review of EPIC notes  on 10/11/16 and 10/12/16. She was called on 10/14/17 for transition of care calls and evaluate for monthly home visits. THN CM was able to get her an earlier pcp follow up appointment for 10/22/16 versus in June 2018 She developed some thrush that was noted at home on 10/15/16 identified by the [facility] RN    Subjective:   Objective:  BP 118/72 (BP Location: Left Arm, Patient Position: Sitting, Cuff Size: Large)   Pulse 72   Temp 97.5 F (36.4 C) (Oral)   Resp 20   Ht 1.575 m (5' 2" )   Wt 190 lb (86.2 kg)   SpO2 97%   BMI 34.75 kg/m   Review of Systems  Constitutional: Negative for chills, diaphoresis, fever, malaise/fatigue and weight loss.  HENT: Negative for congestion, ear discharge, ear pain, hearing loss, nosebleeds, sinus pain, sore throat and tinnitus.   Eyes: Negative for blurred vision, double vision, photophobia, pain, discharge and redness.  Respiratory: Negative for cough, hemoptysis, sputum production, shortness of breath, wheezing and stridor.   Cardiovascular: Negative for chest pain, palpitations, orthopnea, claudication, leg swelling and PND.  Gastrointestinal: Negative for abdominal pain, blood in stool, constipation, diarrhea, heartburn, melena, nausea and vomiting.  Genitourinary: Negative for dysuria,  flank pain, frequency, hematuria and urgency.  Musculoskeletal: Positive for joint pain. Negative for back pain, falls, myalgias and neck pain.       Last fall in March 2018 see below note  Skin: Negative for itching and rash.  Neurological: Positive for weakness. Negative for dizziness, tingling, tremors, sensory change, speech change, focal weakness, seizures, loss of consciousness and headaches.  Endo/Heme/Allergies: Negative for environmental allergies and polydipsia. Bruises/bleeds easily.       Bruises on abdomen   Psychiatric/Behavioral: Negative for depression, hallucinations, memory loss, substance abuse and suicidal ideas. The patient is not nervous/anxious and does not have insomnia.     Physical Exam  Constitutional: She is oriented to person, place, and time. She appears well-developed and well-nourished.  HENT:  Head: Normocephalic and atraumatic.  Eyes: Conjunctivae are normal. Pupils are equal, round, and reactive to light.  Neck: Normal range of motion. Neck supple.  Cardiovascular: Normal rate, regular rhythm, normal heart sounds and intact distal pulses.   Respiratory: Effort normal and breath sounds normal.  GI: Soft. Bowel sounds are normal.  Musculoskeletal: She exhibits edema.  Neurological: She is alert and oriented to person, place, and time.  Skin: Skin is warm and dry.  Psychiatric: She has a normal mood and affect. Her behavior is normal. Judgment and thought content normal.    Encounter Medications:   Outpatient Encounter Prescriptions as of 10/22/2016  Medication Sig  . acetaminophen (TYLENOL) 325 MG tablet Take 650 mg by mouth every 6 (  six) hours as needed.  Marland Kitchen allopurinol (ZYLOPRIM) 100 MG tablet Take 100 mg by mouth daily.  Marland Kitchen amLODipine (NORVASC) 10 MG tablet Take 5 mg by mouth daily.   . chlorthalidone (HYGROTON) 25 MG tablet Take 12.5 mg by mouth daily.  Marland Kitchen guaiFENesin-dextromethorphan (ROBITUSSIN DM) 100-10 MG/5ML syrup Take 5 mLs by mouth every 4  (four) hours as needed for cough.  Marland Kitchen ibuprofen (ADVIL,MOTRIN) 600 MG tablet Take 1 tablet (600 mg total) by mouth every 6 (six) hours as needed for fever.  Marland Kitchen losartan (COZAAR) 100 MG tablet Take 100 mg by mouth daily.  . metFORMIN (GLUCOPHAGE) 1000 MG tablet Take 1,000 mg by mouth 2 (two) times daily with a meal.  . pantoprazole (PROTONIX) 40 MG tablet Take 1 tablet (40 mg total) by mouth daily.  . simvastatin (ZOCOR) 40 MG tablet Take 40 mg by mouth every evening.   No facility-administered encounter medications on file as of 10/22/2016.     Functional Status:   In your present state of health, do you have any difficulty performing the following activities: 10/06/2016 09/11/2016  Hearing? N N  Vision? N N  Difficulty concentrating or making decisions? Y N  Walking or climbing stairs? Y N  Dressing or bathing? Y N  Doing errands, shopping? N N  Preparing Food and eating ? - -  Using the Toilet? - -  In the past six months, have you accidently leaked urine? - -  Do you have problems with loss of bowel control? - -  Managing your Medications? - -  Housekeeping or managing your Housekeeping? - -  Some recent data might be hidden    Fall/Depression Screening:    Fall Risk  08/20/2016 06/14/2016 05/03/2016  Falls in the past year? No No No  Number falls in past yr: - - -  Injury with Fall? - - -  Risk for fall due to : - - -  Risk for fall due to (comments): - - -  Follow up - - -   PHQ 2/9 Scores 08/20/2016 06/14/2016 05/03/2016 02/16/2016 12/10/2015 10/10/2015 09/15/2015  PHQ - 2 Score 0 0 0 0 0 0 0    Assessment:     Acute medical issues Pt went to see Loma Sousa at Dr [place] Crane office earlier than noted today States appointment was changed to 1400 vs 1420 and left at 1436 to return home  C/o of Arithritis pain of extremities and back  THN CM discussed ice, heat applications and massagers for pain Bruises on abdomen  Fall hx- fell at [facility] September 04 2016 no scar feet hit curb    Chronic  medical issues (DM, HTN, HLD, obesity) These medical conditions are managed by medications, diet and follow ups with pcp.  Pt inquired about possibility of decrease the medications for her DM since her lab values are WNL.    DME: cane RW Support Husband, daughter and son   Plan:  Loma Sousa at Dr [place] Cr[place] office denied [facility] CM needs Pt had labs completed next pcp appt is December 22 2016 with labs on  12/24/16 at 1140   Dental appt is in June 2018 to check on a crown   [facility] CM Care Plan Problem One     Most Recent Value  Care Plan Problem One  Knowledge deficit related to self-management of diabetes.  Role Documenting the Problem One  Care Management [place] for Problem One  Active  THN Long Term Goal   over the next  31 days patient will voice understanding of home management of diabetes and other symptoms related to Dm (thrush, etc)   THN Long Term Goal Start Date  10/15/16  THN Long Term Goal Met Date    Interventions for Problem One Long Term Goal  assess for DM needs  and educate patient   THN CM Short Term Goal #1   Over the next 30 days patient will be able to demonstrate with evidence by cbg values management of DM   THN CM Short Term Goal #1 Start Date  10/15/16  THN CM Short Term Goal #1 Met Date   Interventions for Short Term Goal #1  assess DM needs, educate on DM home care, educate on thrush home care  [facility] CM Short Term Goal #2   over the next 7-14 days patient will have follow up care with pcp and be able to manage thrush symptoms at home   [facility] CM Short Term Goal #2 Start Date  10/15/16  THN CM Short Term Goal #2 Met Date    Interventions for Short Term Goal #2  contact pcp office to assist with follow up hospital appointment and rx for thrush medicine, offer comfort home measures for thrush,      Kimberly L. Lavina Hamman, RN, BSN, [place] Care Management [phone]

## 2016-10-25 DIAGNOSIS — M109 Gout, unspecified: Secondary | ICD-10-CM | POA: Diagnosis not present

## 2016-10-25 DIAGNOSIS — J189 Pneumonia, unspecified organism: Secondary | ICD-10-CM | POA: Diagnosis not present

## 2016-10-25 DIAGNOSIS — I10 Essential (primary) hypertension: Secondary | ICD-10-CM | POA: Diagnosis not present

## 2016-10-25 DIAGNOSIS — E785 Hyperlipidemia, unspecified: Secondary | ICD-10-CM | POA: Diagnosis not present

## 2016-10-25 DIAGNOSIS — Z7984 Long term (current) use of oral hypoglycemic drugs: Secondary | ICD-10-CM | POA: Diagnosis not present

## 2016-10-25 DIAGNOSIS — B349 Viral infection, unspecified: Secondary | ICD-10-CM | POA: Diagnosis not present

## 2016-10-25 DIAGNOSIS — E119 Type 2 diabetes mellitus without complications: Secondary | ICD-10-CM | POA: Diagnosis not present

## 2016-10-25 DIAGNOSIS — E669 Obesity, unspecified: Secondary | ICD-10-CM | POA: Diagnosis not present

## 2016-10-25 DIAGNOSIS — M6281 Muscle weakness (generalized): Secondary | ICD-10-CM | POA: Diagnosis not present

## 2016-10-26 DIAGNOSIS — Z7984 Long term (current) use of oral hypoglycemic drugs: Secondary | ICD-10-CM | POA: Diagnosis not present

## 2016-10-26 DIAGNOSIS — B349 Viral infection, unspecified: Secondary | ICD-10-CM | POA: Diagnosis not present

## 2016-10-26 DIAGNOSIS — E669 Obesity, unspecified: Secondary | ICD-10-CM | POA: Diagnosis not present

## 2016-10-26 DIAGNOSIS — I10 Essential (primary) hypertension: Secondary | ICD-10-CM | POA: Diagnosis not present

## 2016-10-26 DIAGNOSIS — E785 Hyperlipidemia, unspecified: Secondary | ICD-10-CM | POA: Diagnosis not present

## 2016-10-26 DIAGNOSIS — J189 Pneumonia, unspecified organism: Secondary | ICD-10-CM | POA: Diagnosis not present

## 2016-10-26 DIAGNOSIS — M109 Gout, unspecified: Secondary | ICD-10-CM | POA: Diagnosis not present

## 2016-10-26 DIAGNOSIS — M6281 Muscle weakness (generalized): Secondary | ICD-10-CM | POA: Diagnosis not present

## 2016-10-26 DIAGNOSIS — E119 Type 2 diabetes mellitus without complications: Secondary | ICD-10-CM | POA: Diagnosis not present

## 2016-10-27 DIAGNOSIS — E785 Hyperlipidemia, unspecified: Secondary | ICD-10-CM | POA: Diagnosis not present

## 2016-10-27 DIAGNOSIS — I10 Essential (primary) hypertension: Secondary | ICD-10-CM | POA: Diagnosis not present

## 2016-10-27 DIAGNOSIS — J189 Pneumonia, unspecified organism: Secondary | ICD-10-CM | POA: Diagnosis not present

## 2016-10-27 DIAGNOSIS — Z7984 Long term (current) use of oral hypoglycemic drugs: Secondary | ICD-10-CM | POA: Diagnosis not present

## 2016-10-27 DIAGNOSIS — E119 Type 2 diabetes mellitus without complications: Secondary | ICD-10-CM | POA: Diagnosis not present

## 2016-10-27 DIAGNOSIS — E669 Obesity, unspecified: Secondary | ICD-10-CM | POA: Diagnosis not present

## 2016-10-27 DIAGNOSIS — M109 Gout, unspecified: Secondary | ICD-10-CM | POA: Diagnosis not present

## 2016-10-27 DIAGNOSIS — B349 Viral infection, unspecified: Secondary | ICD-10-CM | POA: Diagnosis not present

## 2016-10-27 DIAGNOSIS — M6281 Muscle weakness (generalized): Secondary | ICD-10-CM | POA: Diagnosis not present

## 2016-10-28 DIAGNOSIS — E785 Hyperlipidemia, unspecified: Secondary | ICD-10-CM | POA: Diagnosis not present

## 2016-10-28 DIAGNOSIS — E669 Obesity, unspecified: Secondary | ICD-10-CM | POA: Diagnosis not present

## 2016-10-28 DIAGNOSIS — B349 Viral infection, unspecified: Secondary | ICD-10-CM | POA: Diagnosis not present

## 2016-10-28 DIAGNOSIS — M109 Gout, unspecified: Secondary | ICD-10-CM | POA: Diagnosis not present

## 2016-10-28 DIAGNOSIS — M6281 Muscle weakness (generalized): Secondary | ICD-10-CM | POA: Diagnosis not present

## 2016-10-28 DIAGNOSIS — J189 Pneumonia, unspecified organism: Secondary | ICD-10-CM | POA: Diagnosis not present

## 2016-10-28 DIAGNOSIS — I10 Essential (primary) hypertension: Secondary | ICD-10-CM | POA: Diagnosis not present

## 2016-10-28 DIAGNOSIS — Z7984 Long term (current) use of oral hypoglycemic drugs: Secondary | ICD-10-CM | POA: Diagnosis not present

## 2016-10-28 DIAGNOSIS — E119 Type 2 diabetes mellitus without complications: Secondary | ICD-10-CM | POA: Diagnosis not present

## 2016-11-02 DIAGNOSIS — E669 Obesity, unspecified: Secondary | ICD-10-CM | POA: Diagnosis not present

## 2016-11-02 DIAGNOSIS — E785 Hyperlipidemia, unspecified: Secondary | ICD-10-CM | POA: Diagnosis not present

## 2016-11-02 DIAGNOSIS — Z7984 Long term (current) use of oral hypoglycemic drugs: Secondary | ICD-10-CM | POA: Diagnosis not present

## 2016-11-02 DIAGNOSIS — M6281 Muscle weakness (generalized): Secondary | ICD-10-CM | POA: Diagnosis not present

## 2016-11-02 DIAGNOSIS — B349 Viral infection, unspecified: Secondary | ICD-10-CM | POA: Diagnosis not present

## 2016-11-02 DIAGNOSIS — E119 Type 2 diabetes mellitus without complications: Secondary | ICD-10-CM | POA: Diagnosis not present

## 2016-11-02 DIAGNOSIS — J189 Pneumonia, unspecified organism: Secondary | ICD-10-CM | POA: Diagnosis not present

## 2016-11-02 DIAGNOSIS — M109 Gout, unspecified: Secondary | ICD-10-CM | POA: Diagnosis not present

## 2016-11-02 DIAGNOSIS — I10 Essential (primary) hypertension: Secondary | ICD-10-CM | POA: Diagnosis not present

## 2016-11-03 DIAGNOSIS — E669 Obesity, unspecified: Secondary | ICD-10-CM | POA: Diagnosis not present

## 2016-11-03 DIAGNOSIS — M6281 Muscle weakness (generalized): Secondary | ICD-10-CM | POA: Diagnosis not present

## 2016-11-03 DIAGNOSIS — Z7984 Long term (current) use of oral hypoglycemic drugs: Secondary | ICD-10-CM | POA: Diagnosis not present

## 2016-11-03 DIAGNOSIS — B349 Viral infection, unspecified: Secondary | ICD-10-CM | POA: Diagnosis not present

## 2016-11-03 DIAGNOSIS — E119 Type 2 diabetes mellitus without complications: Secondary | ICD-10-CM | POA: Diagnosis not present

## 2016-11-03 DIAGNOSIS — M109 Gout, unspecified: Secondary | ICD-10-CM | POA: Diagnosis not present

## 2016-11-03 DIAGNOSIS — E785 Hyperlipidemia, unspecified: Secondary | ICD-10-CM | POA: Diagnosis not present

## 2016-11-03 DIAGNOSIS — I10 Essential (primary) hypertension: Secondary | ICD-10-CM | POA: Diagnosis not present

## 2016-11-03 DIAGNOSIS — J189 Pneumonia, unspecified organism: Secondary | ICD-10-CM | POA: Diagnosis not present

## 2016-11-05 ENCOUNTER — Other Ambulatory Visit: Payer: Self-pay | Admitting: *Deleted

## 2016-11-05 DIAGNOSIS — B349 Viral infection, unspecified: Secondary | ICD-10-CM | POA: Diagnosis not present

## 2016-11-05 DIAGNOSIS — E785 Hyperlipidemia, unspecified: Secondary | ICD-10-CM | POA: Diagnosis not present

## 2016-11-05 DIAGNOSIS — Z7984 Long term (current) use of oral hypoglycemic drugs: Secondary | ICD-10-CM | POA: Diagnosis not present

## 2016-11-05 DIAGNOSIS — I10 Essential (primary) hypertension: Secondary | ICD-10-CM | POA: Diagnosis not present

## 2016-11-05 DIAGNOSIS — E669 Obesity, unspecified: Secondary | ICD-10-CM | POA: Diagnosis not present

## 2016-11-05 DIAGNOSIS — E119 Type 2 diabetes mellitus without complications: Secondary | ICD-10-CM | POA: Diagnosis not present

## 2016-11-05 DIAGNOSIS — M6281 Muscle weakness (generalized): Secondary | ICD-10-CM | POA: Diagnosis not present

## 2016-11-05 DIAGNOSIS — M109 Gout, unspecified: Secondary | ICD-10-CM | POA: Diagnosis not present

## 2016-11-05 DIAGNOSIS — J189 Pneumonia, unspecified organism: Secondary | ICD-10-CM | POA: Diagnosis not present

## 2016-11-05 NOTE — Patient Outreach (Signed)
[facility](THN) Care Management  11/05/2016  Doreen BeamBessie Y Hankerson 01/21/1941 [id number]   Transition of care follow up  [facility] CM spoke with [name] who reports she is doing well and has just been seen by the HHPT and is getting better. She inquired about an appointment to have a CT completed to check to make sure she is better from her viral syndrome.   THN CM spoke with [place] at Dr Margo AyeHall office to confirm she is to scheduled for a CT at the end of June 2018 near December 03 2016 and is pending a response and will call [name] about confirmed appt date for CT    [facility] checked on the response to [name] thrush concern voiced in May 2018 but no notes found about the thrush for May  2018 [facility] CM had sent a text to Dr Margo AyeHall office on 10/29/16 to ask about assisting with decreasing her DM medications but there had not been any response. [name]  will need to make an appointment with a staff member to get DM meds re adjusted or she could go see Toni AmendCourtney on 11/06/16 at 1020 available appointment to get re evaluated and have DM meds reviewed (Hgb A1C last was 6.6 on 09/11/16 ) per [place] Anndale found her next appt in Dr Margo AyeHall data base listed in December 24 2016 but discussed [name] can be seen at any weekend urgent care visit and would hold a 11/06/16  1020 appointment slot with Toni Amendourtney Discussed the primary care office services will be moving to [address] [place] on July 2nd but the urgent care services will remain in the old building    Follow up call to [name] to discuss recommendation of 6 weeks to get CT scan and it will be scheduled at the end of June 2018 near December 03 2016 per [place] and informed [name] she will be called by staff at Dr Margo AyeHall office.  [name] voiced understanding [name] reports thrush resolved and her cbg was 104 this morning and BP was 130/82 She reports her back pain is better and the bruising on stomach is resolved CM encouraged  continued checking of her BP, temperature and cbgs She declines the 11/06/16  appt and will await December 24 2016 visit to address changes for DM treatment plan Mrs Ardoin inquired about going out to church and contact with others outside of her home for precautions in getting further viral issues.  THN CM discussed temperatures and she has not shown in elevations in temperature.  THN discussed minimizing close contact (hugging) with others and with hand contact to wash her hands or use sanitizer.  She reports she will await CT scan prior to going out at this time   1130 Text sent to Dr hall  office to cancel 11/06/16 1020 appt with courtney Pt will continue DM treatment plan and request changes at July 2018 appt  Plans: Continue to follow Mrs Weckwerth for further needs Pt to call as needed   Latarra Eagleton L. Noelle PennerGibbs, RN, BSN, CCM [facility] Care Management [phone]

## 2016-11-08 DIAGNOSIS — M1712 Unilateral primary osteoarthritis, left knee: Secondary | ICD-10-CM | POA: Diagnosis not present

## 2016-11-08 DIAGNOSIS — M532X7 Spinal instabilities, lumbosacral region: Secondary | ICD-10-CM | POA: Diagnosis not present

## 2016-11-08 DIAGNOSIS — M1711 Unilateral primary osteoarthritis, right knee: Secondary | ICD-10-CM | POA: Diagnosis not present

## 2016-11-09 DIAGNOSIS — E785 Hyperlipidemia, unspecified: Secondary | ICD-10-CM | POA: Diagnosis not present

## 2016-11-09 DIAGNOSIS — E119 Type 2 diabetes mellitus without complications: Secondary | ICD-10-CM | POA: Diagnosis not present

## 2016-11-09 DIAGNOSIS — J189 Pneumonia, unspecified organism: Secondary | ICD-10-CM | POA: Diagnosis not present

## 2016-11-09 DIAGNOSIS — Z7984 Long term (current) use of oral hypoglycemic drugs: Secondary | ICD-10-CM | POA: Diagnosis not present

## 2016-11-09 DIAGNOSIS — E669 Obesity, unspecified: Secondary | ICD-10-CM | POA: Diagnosis not present

## 2016-11-09 DIAGNOSIS — B349 Viral infection, unspecified: Secondary | ICD-10-CM | POA: Diagnosis not present

## 2016-11-09 DIAGNOSIS — M6281 Muscle weakness (generalized): Secondary | ICD-10-CM | POA: Diagnosis not present

## 2016-11-09 DIAGNOSIS — I10 Essential (primary) hypertension: Secondary | ICD-10-CM | POA: Diagnosis not present

## 2016-11-09 DIAGNOSIS — M109 Gout, unspecified: Secondary | ICD-10-CM | POA: Diagnosis not present

## 2016-11-11 DIAGNOSIS — J189 Pneumonia, unspecified organism: Secondary | ICD-10-CM | POA: Diagnosis not present

## 2016-11-11 DIAGNOSIS — I10 Essential (primary) hypertension: Secondary | ICD-10-CM | POA: Diagnosis not present

## 2016-11-11 DIAGNOSIS — Z7984 Long term (current) use of oral hypoglycemic drugs: Secondary | ICD-10-CM | POA: Diagnosis not present

## 2016-11-11 DIAGNOSIS — M6281 Muscle weakness (generalized): Secondary | ICD-10-CM | POA: Diagnosis not present

## 2016-11-11 DIAGNOSIS — E119 Type 2 diabetes mellitus without complications: Secondary | ICD-10-CM | POA: Diagnosis not present

## 2016-11-11 DIAGNOSIS — B349 Viral infection, unspecified: Secondary | ICD-10-CM | POA: Diagnosis not present

## 2016-11-11 DIAGNOSIS — E669 Obesity, unspecified: Secondary | ICD-10-CM | POA: Diagnosis not present

## 2016-11-11 DIAGNOSIS — E785 Hyperlipidemia, unspecified: Secondary | ICD-10-CM | POA: Diagnosis not present

## 2016-11-11 DIAGNOSIS — M109 Gout, unspecified: Secondary | ICD-10-CM | POA: Diagnosis not present

## 2016-11-15 DIAGNOSIS — M6281 Muscle weakness (generalized): Secondary | ICD-10-CM | POA: Diagnosis not present

## 2016-11-15 DIAGNOSIS — E119 Type 2 diabetes mellitus without complications: Secondary | ICD-10-CM | POA: Diagnosis not present

## 2016-11-15 DIAGNOSIS — Z7984 Long term (current) use of oral hypoglycemic drugs: Secondary | ICD-10-CM | POA: Diagnosis not present

## 2016-11-15 DIAGNOSIS — M109 Gout, unspecified: Secondary | ICD-10-CM | POA: Diagnosis not present

## 2016-11-15 DIAGNOSIS — I10 Essential (primary) hypertension: Secondary | ICD-10-CM | POA: Diagnosis not present

## 2016-11-15 DIAGNOSIS — J189 Pneumonia, unspecified organism: Secondary | ICD-10-CM | POA: Diagnosis not present

## 2016-11-15 DIAGNOSIS — E669 Obesity, unspecified: Secondary | ICD-10-CM | POA: Diagnosis not present

## 2016-11-15 DIAGNOSIS — E785 Hyperlipidemia, unspecified: Secondary | ICD-10-CM | POA: Diagnosis not present

## 2016-11-15 DIAGNOSIS — B349 Viral infection, unspecified: Secondary | ICD-10-CM | POA: Diagnosis not present

## 2016-11-16 DIAGNOSIS — M109 Gout, unspecified: Secondary | ICD-10-CM | POA: Diagnosis not present

## 2016-11-16 DIAGNOSIS — E669 Obesity, unspecified: Secondary | ICD-10-CM | POA: Diagnosis not present

## 2016-11-16 DIAGNOSIS — J189 Pneumonia, unspecified organism: Secondary | ICD-10-CM | POA: Diagnosis not present

## 2016-11-16 DIAGNOSIS — E119 Type 2 diabetes mellitus without complications: Secondary | ICD-10-CM | POA: Diagnosis not present

## 2016-11-16 DIAGNOSIS — I10 Essential (primary) hypertension: Secondary | ICD-10-CM | POA: Diagnosis not present

## 2016-11-16 DIAGNOSIS — Z7984 Long term (current) use of oral hypoglycemic drugs: Secondary | ICD-10-CM | POA: Diagnosis not present

## 2016-11-16 DIAGNOSIS — E785 Hyperlipidemia, unspecified: Secondary | ICD-10-CM | POA: Diagnosis not present

## 2016-11-16 DIAGNOSIS — B349 Viral infection, unspecified: Secondary | ICD-10-CM | POA: Diagnosis not present

## 2016-11-16 DIAGNOSIS — M6281 Muscle weakness (generalized): Secondary | ICD-10-CM | POA: Diagnosis not present

## 2016-11-18 DIAGNOSIS — Z7984 Long term (current) use of oral hypoglycemic drugs: Secondary | ICD-10-CM | POA: Diagnosis not present

## 2016-11-18 DIAGNOSIS — E669 Obesity, unspecified: Secondary | ICD-10-CM | POA: Diagnosis not present

## 2016-11-18 DIAGNOSIS — E119 Type 2 diabetes mellitus without complications: Secondary | ICD-10-CM | POA: Diagnosis not present

## 2016-11-18 DIAGNOSIS — M6281 Muscle weakness (generalized): Secondary | ICD-10-CM | POA: Diagnosis not present

## 2016-11-18 DIAGNOSIS — I10 Essential (primary) hypertension: Secondary | ICD-10-CM | POA: Diagnosis not present

## 2016-11-18 DIAGNOSIS — B349 Viral infection, unspecified: Secondary | ICD-10-CM | POA: Diagnosis not present

## 2016-11-18 DIAGNOSIS — E785 Hyperlipidemia, unspecified: Secondary | ICD-10-CM | POA: Diagnosis not present

## 2016-11-18 DIAGNOSIS — J189 Pneumonia, unspecified organism: Secondary | ICD-10-CM | POA: Diagnosis not present

## 2016-11-18 DIAGNOSIS — M109 Gout, unspecified: Secondary | ICD-10-CM | POA: Diagnosis not present

## 2016-11-22 ENCOUNTER — Telehealth: Payer: Self-pay | Admitting: *Deleted

## 2016-11-22 NOTE — Patient Outreach (Signed)
[facility](THN) Care Management  11/22/2016  Melanie BeamBessie Y Black 1940-12-08 [id number]   Care coordination  [facility] CM received a call from [name] inquiring about her "scan"  [facility] CM contacted Dr Scharlene GlossHall's office and spoke with Toni Amendourtney who states the scan will be scheduled six weeks after the date of the previous office visit.  CM discussed [name] voiced concern to have scan so she can visit outside of her home.  THN CM inquired if a staff member of pcp office could contact [name] to share this information and to answer any further questions she may have  Plans THN CM to follow up with [name] at possible a later time in next 1-3 days   [name] L. Noelle PennerGibbs, RN, BSN, CCM [facility] Care Management [phone]

## 2016-11-23 ENCOUNTER — Other Ambulatory Visit: Payer: Self-pay | Admitting: *Deleted

## 2016-11-23 NOTE — Patient Outreach (Signed)
[facility](THN) Care Management  11/23/2016  Melanie BeamBessie Y Black Nov 15, 1940 [id number]   Care coordination  Black was able to speak with [name] after one missed attempt to reach her. She was updated that [facility] Black reached Dr Margo AyeHall office on 11/22/16 and spoke with [name] about her concerns with wanting to get scan done prior to being around others socially [facility] Black informed that pt scan to be scheduled for end of June (6 weeks after 10/22/16) [facility] Black asked if Melanie [name] could be contacted and voiced her concern about going in the community with others prior to scan  [name] informed [name] she had not received a call at this time from pcp office [facility] Black offered Dr Margo AyeHall number to [name] to call to speak with [name] about CT scan   Plans to follow back up with pt this week  [name], RN, BSN, CCM [facility] Care Management [phone]

## 2016-12-01 DIAGNOSIS — J189 Pneumonia, unspecified organism: Secondary | ICD-10-CM | POA: Diagnosis not present

## 2016-12-01 DIAGNOSIS — M6281 Muscle weakness (generalized): Secondary | ICD-10-CM | POA: Diagnosis not present

## 2016-12-01 DIAGNOSIS — B349 Viral infection, unspecified: Secondary | ICD-10-CM | POA: Diagnosis not present

## 2016-12-01 DIAGNOSIS — M109 Gout, unspecified: Secondary | ICD-10-CM | POA: Diagnosis not present

## 2016-12-01 DIAGNOSIS — E785 Hyperlipidemia, unspecified: Secondary | ICD-10-CM | POA: Diagnosis not present

## 2016-12-01 DIAGNOSIS — Z7984 Long term (current) use of oral hypoglycemic drugs: Secondary | ICD-10-CM | POA: Diagnosis not present

## 2016-12-01 DIAGNOSIS — I10 Essential (primary) hypertension: Secondary | ICD-10-CM | POA: Diagnosis not present

## 2016-12-01 DIAGNOSIS — E669 Obesity, unspecified: Secondary | ICD-10-CM | POA: Diagnosis not present

## 2016-12-01 DIAGNOSIS — E119 Type 2 diabetes mellitus without complications: Secondary | ICD-10-CM | POA: Diagnosis not present

## 2016-12-06 ENCOUNTER — Other Ambulatory Visit (HOSPITAL_COMMUNITY): Payer: Self-pay | Admitting: Internal Medicine

## 2016-12-06 ENCOUNTER — Other Ambulatory Visit: Payer: Self-pay | Admitting: *Deleted

## 2016-12-06 DIAGNOSIS — J181 Lobar pneumonia, unspecified organism: Secondary | ICD-10-CM

## 2016-12-06 NOTE — Patient Outreach (Signed)
[facility](THN) Care Management  12/06/2016  Melanie BeamBessie Y Black 01-01-1941 [id number]   Care coordination  [name] CM to inquire about test results and the need for further testing She was referred to Dr Scharlene GlossHall's office  Plan follow up with [name] in 6 weeks   [name], RN, BSN, CCM [facility] Care Management [phone]

## 2016-12-09 ENCOUNTER — Ambulatory Visit (HOSPITAL_COMMUNITY)
Admission: RE | Admit: 2016-12-09 | Discharge: 2016-12-09 | Disposition: A | Discharge status: Home / Self Care | Payer: Medicare HMO | Source: Ambulatory Visit | Attending: Internal Medicine | Admitting: Internal Medicine

## 2016-12-09 ENCOUNTER — Encounter (HOSPITAL_COMMUNITY): Payer: Self-pay

## 2016-12-09 DIAGNOSIS — I722 Aneurysm of renal artery: Secondary | ICD-10-CM | POA: Insufficient documentation

## 2016-12-09 DIAGNOSIS — J189 Pneumonia, unspecified organism: Secondary | ICD-10-CM | POA: Diagnosis present

## 2016-12-09 DIAGNOSIS — N281 Cyst of kidney, acquired: Secondary | ICD-10-CM | POA: Diagnosis not present

## 2016-12-09 DIAGNOSIS — I2510 Atherosclerotic heart disease of native coronary artery without angina pectoris: Secondary | ICD-10-CM | POA: Insufficient documentation

## 2016-12-09 DIAGNOSIS — Z8701 Personal history of pneumonia (recurrent): Secondary | ICD-10-CM | POA: Insufficient documentation

## 2016-12-09 DIAGNOSIS — I77810 Thoracic aortic ectasia: Secondary | ICD-10-CM | POA: Insufficient documentation

## 2016-12-09 DIAGNOSIS — R590 Localized enlarged lymph nodes: Secondary | ICD-10-CM | POA: Insufficient documentation

## 2016-12-09 DIAGNOSIS — I700 Atherosclerosis of aorta: Secondary | ICD-10-CM | POA: Insufficient documentation

## 2016-12-09 DIAGNOSIS — I712 Thoracic aortic aneurysm, without rupture, unspecified: Secondary | ICD-10-CM

## 2016-12-09 DIAGNOSIS — J181 Lobar pneumonia, unspecified organism: Secondary | ICD-10-CM

## 2016-12-09 HISTORY — DX: Thoracic aortic aneurysm, without rupture: I71.2

## 2016-12-09 HISTORY — DX: Thoracic aortic aneurysm, without rupture, unspecified: I71.20

## 2016-12-09 HISTORY — DX: Aneurysm of renal artery: I72.2

## 2016-12-09 LAB — POCT I-STAT CREATININE: CREATININE: 1.3 mg/dL — AB (ref 0.44–1.00)

## 2016-12-09 MED ORDER — IOPAMIDOL (ISOVUE-300) INJECTION 61%
75.0000 mL | Freq: Once | INTRAVENOUS | Status: AC | PRN
  Administered 2016-12-09: 75 mL via INTRAVENOUS

## 2016-12-20 DIAGNOSIS — M170 Bilateral primary osteoarthritis of knee: Secondary | ICD-10-CM | POA: Diagnosis not present

## 2016-12-22 DIAGNOSIS — E1165 Type 2 diabetes mellitus with hyperglycemia: Secondary | ICD-10-CM | POA: Diagnosis not present

## 2016-12-22 DIAGNOSIS — D509 Iron deficiency anemia, unspecified: Secondary | ICD-10-CM | POA: Diagnosis not present

## 2016-12-22 DIAGNOSIS — I10 Essential (primary) hypertension: Secondary | ICD-10-CM | POA: Diagnosis not present

## 2016-12-24 DIAGNOSIS — R944 Abnormal results of kidney function studies: Secondary | ICD-10-CM | POA: Diagnosis not present

## 2016-12-24 DIAGNOSIS — Z6834 Body mass index (BMI) 34.0-34.9, adult: Secondary | ICD-10-CM | POA: Diagnosis not present

## 2016-12-24 DIAGNOSIS — E1165 Type 2 diabetes mellitus with hyperglycemia: Secondary | ICD-10-CM | POA: Diagnosis not present

## 2017-01-25 ENCOUNTER — Other Ambulatory Visit: Payer: Self-pay | Admitting: *Deleted

## 2017-01-25 NOTE — Patient Outreach (Signed)
[facility]) Care Management  01/25/2017  [name] 07-Apr-1941 [id number]   Case closure  THN CM followed up with Mrs Beste who reports she is doing very well and is now back to her dental treatments and in the community going to church.  Reports feeling "wonderful except my knee"    Mrs Bleecker reports she had her follow up CT and it was negative She reports meeting with the new provider at Dr Juel Burrow office to review her results  She is seeing Dr French Ana Orthopedic provider (completed her knee surgery) for steroid injections for her right knee She reports she believes the weakness is related to arthritis but had her last steroid injection in July 2018. She reports having some weakness of her right knee so she uses her can when she is outside of her home to prevent falls     Plans case closure as agreed upon by Mrs Urschel She states she will contact [facility] CM as needed She has met all her [employer] goals. Mrs Tonkinson voiced appreciation of all services rendered by this [facility] CM  sent in basket message to [place] letters to pcp and Mrs Rantz welcoming any further future assistance as needed    [facility] CM Care Plan Problem One     Most Recent Value  Care Plan Problem One  Knowledge deficit related to self-management of diabetes.  Role Documenting the Problem One  Care Management [place] for Problem One  Active  THN Long Term Goal   over the next 31 days patient will voice understanding of home management of diabetes and other symptoms related to Dm (thrush, etc)   THN Long Term Goal Start Date  10/15/16  [facility] Long Term Goal Met Date  11/05/16  Interventions for Problem One Long Term Goal  assess for DM needs  and educate patient   THN CM Short Term Goal #1   Over the next 30 days patient will be able to demonstrate with evidence by cbg values management of DM   THN CM Short Term Goal #1 Start Date  10/15/16  [facility] CM Short Term Goal #1 Met Date  11/05/16   Interventions for Short Term Goal #1  assess DM needs, educate on DM home care, educate on thrush home care  [facility] CM Short Term Goal #2   over the next 7-14 days patient will have follow up care with pcp and be able to manage thrush symptoms at home   [facility] CM Short Term Goal #2 Start Date  10/15/16  [facility] CM Short Term Goal #2 Met Date  11/22/16  Interventions for Short Term Goal #2  contact pcp office to assist with follow up hospital appointment and rx for thrush medicine, offer comfort home measures for thrush,        Kimberly L. Lavina Hamman, RN, BSN, [place] Care Management [phone]

## 2017-02-04 DIAGNOSIS — K921 Melena: Secondary | ICD-10-CM | POA: Diagnosis not present

## 2017-02-04 DIAGNOSIS — K625 Hemorrhage of anus and rectum: Secondary | ICD-10-CM | POA: Diagnosis not present

## 2017-02-04 DIAGNOSIS — Z6833 Body mass index (BMI) 33.0-33.9, adult: Secondary | ICD-10-CM | POA: Diagnosis not present

## 2017-03-08 ENCOUNTER — Telehealth: Payer: Self-pay | Admitting: Gastroenterology

## 2017-03-08 NOTE — Telephone Encounter (Signed)
Recall for tcs °

## 2017-03-08 NOTE — Telephone Encounter (Signed)
Letter mailed to pt.  

## 2017-03-28 DIAGNOSIS — I10 Essential (primary) hypertension: Secondary | ICD-10-CM | POA: Diagnosis not present

## 2017-03-28 DIAGNOSIS — D509 Iron deficiency anemia, unspecified: Secondary | ICD-10-CM | POA: Diagnosis not present

## 2017-03-28 DIAGNOSIS — E1165 Type 2 diabetes mellitus with hyperglycemia: Secondary | ICD-10-CM | POA: Diagnosis not present

## 2017-03-30 DIAGNOSIS — M25562 Pain in left knee: Secondary | ICD-10-CM | POA: Diagnosis not present

## 2017-03-30 DIAGNOSIS — M109 Gout, unspecified: Secondary | ICD-10-CM | POA: Diagnosis not present

## 2017-03-30 DIAGNOSIS — E875 Hyperkalemia: Secondary | ICD-10-CM | POA: Diagnosis not present

## 2017-03-30 DIAGNOSIS — I10 Essential (primary) hypertension: Secondary | ICD-10-CM | POA: Diagnosis not present

## 2017-03-30 DIAGNOSIS — D649 Anemia, unspecified: Secondary | ICD-10-CM | POA: Diagnosis not present

## 2017-03-30 DIAGNOSIS — R944 Abnormal results of kidney function studies: Secondary | ICD-10-CM | POA: Diagnosis not present

## 2017-03-30 DIAGNOSIS — E871 Hypo-osmolality and hyponatremia: Secondary | ICD-10-CM | POA: Diagnosis not present

## 2017-03-30 DIAGNOSIS — E1165 Type 2 diabetes mellitus with hyperglycemia: Secondary | ICD-10-CM | POA: Diagnosis not present

## 2017-04-07 DIAGNOSIS — Z23 Encounter for immunization: Secondary | ICD-10-CM | POA: Diagnosis not present

## 2017-05-04 DIAGNOSIS — E119 Type 2 diabetes mellitus without complications: Secondary | ICD-10-CM | POA: Diagnosis not present

## 2017-05-04 DIAGNOSIS — Z961 Presence of intraocular lens: Secondary | ICD-10-CM | POA: Diagnosis not present

## 2017-05-04 DIAGNOSIS — H52201 Unspecified astigmatism, right eye: Secondary | ICD-10-CM | POA: Diagnosis not present

## 2017-05-04 DIAGNOSIS — H5203 Hypermetropia, bilateral: Secondary | ICD-10-CM | POA: Diagnosis not present

## 2017-05-04 DIAGNOSIS — H524 Presbyopia: Secondary | ICD-10-CM | POA: Diagnosis not present

## 2017-05-04 DIAGNOSIS — Z7984 Long term (current) use of oral hypoglycemic drugs: Secondary | ICD-10-CM | POA: Diagnosis not present

## 2017-06-10 DIAGNOSIS — Z0100 Encounter for examination of eyes and vision without abnormal findings: Secondary | ICD-10-CM | POA: Diagnosis not present

## 2017-06-17 DIAGNOSIS — I10 Essential (primary) hypertension: Secondary | ICD-10-CM | POA: Diagnosis not present

## 2017-06-17 DIAGNOSIS — Z5181 Encounter for therapeutic drug level monitoring: Secondary | ICD-10-CM | POA: Diagnosis not present

## 2017-06-17 DIAGNOSIS — Z6834 Body mass index (BMI) 34.0-34.9, adult: Secondary | ICD-10-CM | POA: Diagnosis not present

## 2017-06-28 DIAGNOSIS — M170 Bilateral primary osteoarthritis of knee: Secondary | ICD-10-CM | POA: Diagnosis not present

## 2017-07-01 DIAGNOSIS — I10 Essential (primary) hypertension: Secondary | ICD-10-CM | POA: Diagnosis not present

## 2017-08-02 DIAGNOSIS — E782 Mixed hyperlipidemia: Secondary | ICD-10-CM | POA: Diagnosis not present

## 2017-08-02 DIAGNOSIS — M1990 Unspecified osteoarthritis, unspecified site: Secondary | ICD-10-CM | POA: Diagnosis not present

## 2017-08-02 DIAGNOSIS — E1165 Type 2 diabetes mellitus with hyperglycemia: Secondary | ICD-10-CM | POA: Diagnosis not present

## 2017-08-02 DIAGNOSIS — E876 Hypokalemia: Secondary | ICD-10-CM | POA: Diagnosis not present

## 2017-08-02 DIAGNOSIS — E790 Hyperuricemia without signs of inflammatory arthritis and tophaceous disease: Secondary | ICD-10-CM | POA: Diagnosis not present

## 2017-08-02 DIAGNOSIS — R944 Abnormal results of kidney function studies: Secondary | ICD-10-CM | POA: Diagnosis not present

## 2017-08-02 DIAGNOSIS — D509 Iron deficiency anemia, unspecified: Secondary | ICD-10-CM | POA: Diagnosis not present

## 2017-08-02 DIAGNOSIS — D649 Anemia, unspecified: Secondary | ICD-10-CM | POA: Diagnosis not present

## 2017-08-02 DIAGNOSIS — I10 Essential (primary) hypertension: Secondary | ICD-10-CM | POA: Diagnosis not present

## 2017-08-04 DIAGNOSIS — E876 Hypokalemia: Secondary | ICD-10-CM | POA: Diagnosis not present

## 2017-08-04 DIAGNOSIS — E782 Mixed hyperlipidemia: Secondary | ICD-10-CM | POA: Diagnosis not present

## 2017-08-04 DIAGNOSIS — E790 Hyperuricemia without signs of inflammatory arthritis and tophaceous disease: Secondary | ICD-10-CM | POA: Diagnosis not present

## 2017-08-04 DIAGNOSIS — D509 Iron deficiency anemia, unspecified: Secondary | ICD-10-CM | POA: Diagnosis not present

## 2017-08-04 DIAGNOSIS — M1990 Unspecified osteoarthritis, unspecified site: Secondary | ICD-10-CM | POA: Diagnosis not present

## 2017-08-04 DIAGNOSIS — D649 Anemia, unspecified: Secondary | ICD-10-CM | POA: Diagnosis not present

## 2017-08-04 DIAGNOSIS — E1165 Type 2 diabetes mellitus with hyperglycemia: Secondary | ICD-10-CM | POA: Diagnosis not present

## 2017-08-04 DIAGNOSIS — R944 Abnormal results of kidney function studies: Secondary | ICD-10-CM | POA: Diagnosis not present

## 2017-08-04 DIAGNOSIS — I10 Essential (primary) hypertension: Secondary | ICD-10-CM | POA: Diagnosis not present

## 2017-08-27 DIAGNOSIS — R251 Tremor, unspecified: Secondary | ICD-10-CM | POA: Diagnosis not present

## 2017-08-27 DIAGNOSIS — M542 Cervicalgia: Secondary | ICD-10-CM | POA: Diagnosis not present

## 2017-08-27 DIAGNOSIS — I10 Essential (primary) hypertension: Secondary | ICD-10-CM | POA: Diagnosis not present

## 2017-08-27 DIAGNOSIS — Z6834 Body mass index (BMI) 34.0-34.9, adult: Secondary | ICD-10-CM | POA: Diagnosis not present

## 2017-12-30 DIAGNOSIS — M1990 Unspecified osteoarthritis, unspecified site: Secondary | ICD-10-CM | POA: Diagnosis not present

## 2017-12-30 DIAGNOSIS — I10 Essential (primary) hypertension: Secondary | ICD-10-CM | POA: Diagnosis not present

## 2017-12-30 DIAGNOSIS — R251 Tremor, unspecified: Secondary | ICD-10-CM | POA: Diagnosis not present

## 2017-12-30 DIAGNOSIS — E1165 Type 2 diabetes mellitus with hyperglycemia: Secondary | ICD-10-CM | POA: Diagnosis not present

## 2018-01-31 DIAGNOSIS — E1165 Type 2 diabetes mellitus with hyperglycemia: Secondary | ICD-10-CM | POA: Diagnosis not present

## 2018-01-31 DIAGNOSIS — R944 Abnormal results of kidney function studies: Secondary | ICD-10-CM | POA: Diagnosis not present

## 2018-01-31 DIAGNOSIS — E876 Hypokalemia: Secondary | ICD-10-CM | POA: Diagnosis not present

## 2018-01-31 DIAGNOSIS — M1990 Unspecified osteoarthritis, unspecified site: Secondary | ICD-10-CM | POA: Diagnosis not present

## 2018-01-31 DIAGNOSIS — E790 Hyperuricemia without signs of inflammatory arthritis and tophaceous disease: Secondary | ICD-10-CM | POA: Diagnosis not present

## 2018-01-31 DIAGNOSIS — D649 Anemia, unspecified: Secondary | ICD-10-CM | POA: Diagnosis not present

## 2018-01-31 DIAGNOSIS — I10 Essential (primary) hypertension: Secondary | ICD-10-CM | POA: Diagnosis not present

## 2018-01-31 DIAGNOSIS — E782 Mixed hyperlipidemia: Secondary | ICD-10-CM | POA: Diagnosis not present

## 2018-01-31 DIAGNOSIS — D509 Iron deficiency anemia, unspecified: Secondary | ICD-10-CM | POA: Diagnosis not present

## 2018-02-03 ENCOUNTER — Other Ambulatory Visit: Payer: Self-pay | Admitting: Internal Medicine

## 2018-02-03 DIAGNOSIS — M1990 Unspecified osteoarthritis, unspecified site: Secondary | ICD-10-CM | POA: Diagnosis not present

## 2018-02-03 DIAGNOSIS — R251 Tremor, unspecified: Secondary | ICD-10-CM | POA: Diagnosis not present

## 2018-02-03 DIAGNOSIS — E1165 Type 2 diabetes mellitus with hyperglycemia: Secondary | ICD-10-CM | POA: Diagnosis not present

## 2018-02-03 DIAGNOSIS — I10 Essential (primary) hypertension: Secondary | ICD-10-CM | POA: Diagnosis not present

## 2018-02-03 DIAGNOSIS — E782 Mixed hyperlipidemia: Secondary | ICD-10-CM | POA: Diagnosis not present

## 2018-02-03 DIAGNOSIS — Z780 Asymptomatic menopausal state: Secondary | ICD-10-CM

## 2018-02-03 DIAGNOSIS — M109 Gout, unspecified: Secondary | ICD-10-CM | POA: Diagnosis not present

## 2018-02-03 DIAGNOSIS — Z6834 Body mass index (BMI) 34.0-34.9, adult: Secondary | ICD-10-CM | POA: Diagnosis not present

## 2018-02-07 DIAGNOSIS — M1711 Unilateral primary osteoarthritis, right knee: Secondary | ICD-10-CM | POA: Diagnosis not present

## 2018-02-10 DIAGNOSIS — M1711 Unilateral primary osteoarthritis, right knee: Secondary | ICD-10-CM

## 2018-02-14 DIAGNOSIS — M109 Gout, unspecified: Secondary | ICD-10-CM | POA: Diagnosis not present

## 2018-02-14 DIAGNOSIS — R251 Tremor, unspecified: Secondary | ICD-10-CM | POA: Diagnosis not present

## 2018-02-14 DIAGNOSIS — Z01818 Encounter for other preprocedural examination: Secondary | ICD-10-CM | POA: Diagnosis not present

## 2018-02-14 DIAGNOSIS — E1165 Type 2 diabetes mellitus with hyperglycemia: Secondary | ICD-10-CM | POA: Diagnosis not present

## 2018-02-14 DIAGNOSIS — Z6835 Body mass index (BMI) 35.0-35.9, adult: Secondary | ICD-10-CM | POA: Diagnosis not present

## 2018-02-14 DIAGNOSIS — I10 Essential (primary) hypertension: Secondary | ICD-10-CM | POA: Diagnosis not present

## 2018-02-14 DIAGNOSIS — M25561 Pain in right knee: Secondary | ICD-10-CM | POA: Diagnosis not present

## 2018-02-16 DIAGNOSIS — M1711 Unilateral primary osteoarthritis, right knee: Secondary | ICD-10-CM | POA: Diagnosis not present

## 2018-02-17 ENCOUNTER — Ambulatory Visit: Payer: Self-pay | Admitting: Physician Assistant

## 2018-02-17 NOTE — H&P (Signed)
TOTAL KNEE ADMISSION H&P  Patient is being admitted for right total knee arthroplasty.  Subjective:  Chief Complaint:right knee pain.  HPI: [name], 77 y.o. female, has a history of pain and functional disability in the right knee due to arthritis and has failed non-surgical conservative treatments for greater than 12 weeks to includeNSAID's and/or analgesics, corticosteriod injections, viscosupplementation injections, use of assistive devices and activity modification.  Onset of symptoms was gradual, starting >10 years ago with gradually worsening course since that time. The patient noted no past surgery on the right knee(s).  Patient currently rates pain in the right knee(s) at 10 out of 10 with activity. Patient has night pain, worsening of pain with activity and weight bearing, pain that interferes with activities of daily living, pain with passive range of motion, crepitus and joint swelling.  Patient has evidence of periarticular osteophytes and joint space narrowing by imaging studies.There is no active infection.      Patient Active Problem List   Diagnosis Date Noted  . Primary localized osteoarthritis of right knee 02/10/2018  . Acute viral syndrome 10/06/2016  . Weakness 10/06/2016  . Hypercholesteremia 10/06/2016  . Gout 10/06/2016  . Diabetes mellitus without complication (HCC) 10/06/2016  . Viral syndrome 10/06/2016  . Precordial chest pain 09/11/2016  . Diabetes (HCC) 05/03/2016  . HYPOKALEMIA 11/01/2008  . HYPERGLYCEMIA, FASTING 11/01/2008  . Type 2 diabetes mellitus with hyperlipidemia (HCC) 10/17/2008  . GOUT, UNSPECIFIED 10/17/2008  . OBESITY 10/17/2008  . MIGRAINE HEADACHE 10/17/2008  . Essential hypertension 10/17/2008  . INTERNAL HEMORRHOIDS 10/17/2008  . DIVERTICULOSIS, COLON 10/17/2008  . ARTHRITIS 10/17/2008       Past Medical History:  Diagnosis Date  . Diabetes mellitus without complication (HCC)   . Gout   . Hypercholesteremia   .  Hypertension          Past Surgical History:  Procedure Laterality Date  . ABDOMINAL HYSTERECTOMY             Current Outpatient Medications  Medication Sig Dispense Refill Last Dose  . acetaminophen (TYLENOL) 325 MG tablet Take 650 mg by mouth every 6 (six) hours as needed.   Taking  . allopurinol (ZYLOPRIM) 100 MG tablet Take 100 mg by mouth daily.   Taking  . amLODipine (NORVASC) 10 MG tablet Take 5 mg by mouth daily.    Taking  . chlorthalidone (HYGROTON) 25 MG tablet Take 12.5 mg by mouth daily.   Taking  . guaiFENesin-dextromethorphan (ROBITUSSIN DM) 100-10 MG/5ML syrup Take 5 mLs by mouth every 4 (four) hours as needed for cough. 118 mL 0 Taking  . ibuprofen (ADVIL,MOTRIN) 600 MG tablet Take 1 tablet (600 mg total) by mouth every 6 (six) hours as needed for fever. 30 tablet 0 Taking  . losartan (COZAAR) 100 MG tablet Take 100 mg by mouth daily.   Taking  . metFORMIN (GLUCOPHAGE) 1000 MG tablet Take 1,000 mg by mouth 2 (two) times daily with a meal.   Taking  . pantoprazole (PROTONIX) 40 MG tablet Take 1 tablet (40 mg total) by mouth daily. 30 tablet 2 Taking  . simvastatin (ZOCOR) 40 MG tablet Take 40 mg by mouth every evening.   Taking   No current facility-administered medications for this visit.    No Known Allergies  Social History       Tobacco Use  . Smoking status: Never Smoker  . Smokeless tobacco: Never Used  Substance Use Topics  . Alcohol use: No    No   family history on file.   Review of Systems  Cardiovascular: Positive for leg swelling.  Genitourinary: Positive for frequency.  Musculoskeletal: Positive for joint pain.  All other systems reviewed and are negative.   Objective:  Physical Exam  Constitutional: She is oriented to person, place, and time. She appears well-developed and well-nourished. No distress.  HENT:  Head: Normocephalic and atraumatic.  Nose: Nose normal.  Eyes: Pupils are equal, round, and reactive to  light. Conjunctivae and EOM are normal.  Neck: Normal range of motion. Neck supple.  Cardiovascular: Normal rate, regular rhythm, normal heart sounds and intact distal pulses.  Respiratory: Effort normal and breath sounds normal. No respiratory distress. She has no wheezes.  GI: Soft. Bowel sounds are normal. She exhibits no distension. There is no tenderness.  Musculoskeletal:       Right knee: She exhibits swelling and bony tenderness. She exhibits normal range of motion, no effusion and no laceration. Tenderness found. Medial joint line and lateral joint line tenderness noted.  Lymphadenopathy:    She has no cervical adenopathy.  Neurological: She is alert and oriented to person, place, and time. No cranial nerve deficit.  Skin: Skin is warm and dry. No rash noted. No erythema.  Psychiatric: She has a normal mood and affect. Her behavior is normal.    Vital signs in last 24 hours: @VSRANGES@  Labs:   Estimated body mass index is 34.75 kg/m as calculated from the following:   Height as of 10/22/16: 5' 2" (1.575 m).   Weight as of 10/22/16: 86.2 kg.   Imaging Review Plain radiographs demonstrate severe degenerative joint disease of the right knee(s). The overall alignment issignificant valgus. The bone quality appears to be good for age and reported activity level.   Preoperative templating of the joint replacement has been completed, documented, and submitted to the Operating Room personnel in order to optimize intra-operative equipment management.   Anticipated LOS equal to or greater than 2 midnights due to - Age 65 and older with one or more of the following:             - Obesity             - Expected need for hospital services (PT, OT, Nursing) required for safe   discharge             - Anticipated need for postoperative skilled nursing care or inpatient rehab             - Active co-morbidities: Diabetes OR   - Unanticipated findings during/Post Surgery:  None  - Patient is a high risk of re-admission due to: None     Assessment/Plan:  End stage arthritis, right knee   The patient history, physical examination, clinical judgment of the provider and imaging studies are consistent with end stage degenerative joint disease of the right knee(s) and total knee arthroplasty is deemed medically necessary. The treatment options including medical management, injection therapy arthroscopy and arthroplasty were discussed at length. The risks and benefits of total knee arthroplasty were presented and reviewed. The risks due to aseptic loosening, infection, stiffness, patella tracking problems, thromboembolic complications and other imponderables were discussed. The patient acknowledged the explanation, agreed to proceed with the plan and consent was signed. Patient is being admitted for inpatient treatment for surgery, pain control, PT, OT, prophylactic antibiotics, VTE prophylaxis, progressive ambulation and ADL's and discharge planning. The patient is planning to be discharged home with home health services  

## 2018-03-02 NOTE — Progress Notes (Addendum)
01-30-18 HGA1C and LOV on chart  09-12-16 (Epic) ECHO

## 2018-03-02 NOTE — Patient Instructions (Signed)
[name]  03/02/2018   Your procedure is scheduled on: 03-15-18    Report to [facility] Main  Entrance    Report to admitting at 10:45 AM    Call this number if you have problems the morning of surgery [phone]   Remember: Do not eat food or drink liquids :After Midnight. You may have a Clear Liquid Diet from Midnight until 7:15 AM. After 7:15 AM, nothing until after surgery.     CLEAR LIQUID DIET   Foods Allowed                                                                     Foods Excluded  Coffee and tea, regular and decaf                             liquids that you cannot  Plain Jell-O in any flavor                                             see through such as: Fruit ices (not with fruit pulp)                                     milk, soups, orange juice  Iced Popsicles                                    All solid food Carbonated beverages, regular and diet                                    Cranberry, grape and apple juices Sports drinks like Gatorade Lightly seasoned clear broth or consume(fat free) Sugar, honey syrup  Sample Menu Breakfast                                Lunch                                     Supper Cranberry juice                    Beef broth                            Chicken broth Jell-O                                     Grape juice  Apple juice Coffee or tea                        Jell-O                                      Popsicle                                                Coffee or tea                        Coffee or tea  _____________________________________________________________________    BRUSH YOUR TEETH MORNING OF SURGERY AND RINSE YOUR MOUTH OUT, NO CHEWING GUM CANDY OR MINTS.     Take these medicines the morning of surgery with A SIP OF WATER: Allopurinol (Zyloprim), Amlodipine (Norvasc), and Propranolol (Inderal)   DO NOT TAKE ANY DIABETIC MEDICATIONS DAY  OF YOUR SURGERY                               You may not have any metal on your body including hair pins and              piercings  Do not wear jewelry, make-up, lotions, powders or perfumes, deodorant             Do not wear nail polish.  Do not shave  48 hours prior to surgery.     Do not bring valuables to the hospital. [facility] IS NOT             RESPONSIBLE   FOR VALUABLES.  Contacts, dentures or bridgework may not be worn into surgery.  Leave suitcase in the car. After surgery it may be brought to your room.      Special Instructions: N/A              Please read over the following fact sheets you were given: _____________________________________________________________________             How to Manage Your Diabetes Before and After Surgery  Why is it important to control my blood sugar before and after surgery? . Improving blood sugar levels before and after surgery helps healing and can limit problems. . A way of improving blood sugar control is eating a healthy diet by: o  Eating less sugar and carbohydrates o  Increasing activity/exercise o  Talking with your doctor about reaching your blood sugar goals . High blood sugars (greater than 180 mg/dL) can raise your risk of infections and slow your recovery, so you will need to focus on controlling your diabetes during the weeks before surgery. . Make sure that the doctor who takes care of your diabetes knows about your planned surgery including the date and location.  How do I manage my blood sugar before surgery? . Check your blood sugar at least 4 times a day, starting 2 days before surgery, to make sure that the level is not too high or low. o Check your blood sugar the morning of your surgery when you wake up and every 2 hours until you get to the Short Stay unit. . If your blood sugar is less than  70 mg/dL, you will need to treat for low blood sugar: o Do not take insulin. o Treat a low blood sugar (less than  70 mg/dL) with  cup of clear juice (cranberry or apple), 4 glucose tablets, OR glucose gel. o Recheck blood sugar in 15 minutes after treatment (to make sure it is greater than 70 mg/dL). If your blood sugar is not greater than 70 mg/dL on recheck, call [phone] for further instructions. . Report your blood sugar to the short stay nurse when you get to Short Stay.  . If you are admitted to the hospital after surgery: o Your blood sugar will be checked by the staff and you will probably be given insulin after surgery (instead of oral diabetes medicines) to make sure you have good blood sugar levels. o The goal for blood sugar control after surgery is 80-180 mg/dL.   WHAT DO I DO ABOUT MY DIABETES MEDICATION?  Marland Kitchen Do not take oral diabetes medicines (pills) the morning of surgery.  . THE DAY BEFORE SURGERY, take your usual dose of Metformin              Reviewed and Endorsed by [facility] Patient Education Committee, August 2015   [facility] - Preparing for Surgery Before surgery, you can play an important role.  Because skin is not sterile, your skin needs to be as free of germs as possible.  You can reduce the number of germs on your skin by washing with CHG (chlorahexidine gluconate) soap before surgery.  CHG is an antiseptic cleaner which kills germs and bonds with the skin to continue killing germs even after washing. Please DO NOT use if you have an allergy to CHG or antibacterial soaps.  If your skin becomes reddened/irritated stop using the CHG and inform your nurse when you arrive at Short Stay. Do not shave (including legs and underarms) for at least 48 hours prior to the first CHG shower.  You may shave your face/neck. Please follow these instructions carefully:  1.  Shower with CHG Soap the night before surgery and the  morning of Surgery.  2.  If you choose to wash your hair, wash your hair first as usual with your  normal  shampoo.  3.  After you shampoo, rinse your hair  and body thoroughly to remove the  shampoo.                           4.  Use CHG as you would any other liquid soap.  You can apply chg directly  to the skin and wash                       Gently with a scrungie or clean washcloth.  5.  Apply the CHG Soap to your body ONLY FROM THE NECK DOWN.   Do not use on face/ open                           Wound or open sores. Avoid contact with eyes, ears mouth and genitals (private parts).                       Wash face,  Genitals (private parts) with your normal soap.             6.  Wash thoroughly, paying special attention to the area where your surgery  will be performed.  7.  Thoroughly rinse your body with warm water from the neck down.  8.  DO NOT shower/wash with your normal soap after using and rinsing off  the CHG Soap.                9.  Pat yourself dry with a clean towel.            10.  Wear clean pajamas.            11.  Place clean sheets on your bed the night of your first shower and do not  sleep with pets. Day of Surgery : Do not apply any lotions/deodorants the morning of surgery.  Please wear clean clothes to the hospital/surgery center.  FAILURE TO FOLLOW THESE INSTRUCTIONS MAY RESULT IN THE CANCELLATION OF YOUR SURGERY PATIENT SIGNATURE_________________________________  NURSE SIGNATURE__________________________________  ________________________________________________________________________   Rogelia Mire  An incentive spirometer is a tool that can help keep your lungs clear and active. This tool measures how well you are filling your lungs with each breath. Taking long deep breaths may help reverse or decrease the chance of developing breathing (pulmonary) problems (especially infection) following:  A long period of time when you are unable to move or be active. BEFORE THE PROCEDURE   If the spirometer includes an indicator to show your best effort, your nurse or respiratory therapist will set it to a desired  goal.  If possible, sit up straight or lean slightly forward. Try not to slouch.  Hold the incentive spirometer in an upright position. INSTRUCTIONS FOR USE  1. Sit on the edge of your bed if possible, or sit up as far as you can in bed or on a chair. 2. Hold the incentive spirometer in an upright position. 3. Breathe out normally. 4. Place the mouthpiece in your mouth and seal your lips tightly around it. 5. Breathe in slowly and as deeply as possible, raising the piston or the ball toward the top of the column. 6. Hold your breath for 3-5 seconds or for as long as possible. Allow the piston or ball to fall to the bottom of the column. 7. Remove the mouthpiece from your mouth and breathe out normally. 8. Rest for a few seconds and repeat Steps 1 through 7 at least 10 times every 1-2 hours when you are awake. Take your time and take a few normal breaths between deep breaths. 9. The spirometer may include an indicator to show your best effort. Use the indicator as a goal to work toward during each repetition. 10. After each set of 10 deep breaths, practice coughing to be sure your lungs are clear. If you have an incision (the cut made at the time of surgery), support your incision when coughing by placing a pillow or rolled up towels firmly against it. Once you are able to get out of bed, walk around indoors and cough well. You may stop using the incentive spirometer when instructed by your caregiver.  RISKS AND COMPLICATIONS  Take your time so you do not get dizzy or light-headed.  If you are in pain, you may need to take or ask for pain medication before doing incentive spirometry. It is harder to take a deep breath if you are having pain. AFTER USE  Rest and breathe slowly and easily.  It can be helpful to keep track of a log of your progress. Your caregiver can provide you with a simple table to help with this.  If you are using the spirometer at home, follow these instructions: SEEK  MEDICAL CARE IF:   You are having difficultly using the spirometer.  You have trouble using the spirometer as often as instructed.  Your pain medication is not giving enough relief while using the spirometer.  You develop fever of 100.5 F (38.1 C) or higher. SEEK IMMEDIATE MEDICAL CARE IF:   You cough up bloody sputum that had not been present before.  You develop fever of 102 F (38.9 C) or greater.  You develop worsening pain at or near the incision site. MAKE SURE YOU:   Understand these instructions.  Will watch your condition.  Will get help right away if you are not doing well or get worse. Document Released: 10/04/2006 Document Revised: 08/16/2011 Document Reviewed: 12/05/2006 [employer] Patient Information 2014 [employer], [place].   ________________________________________________________________________  WHAT IS A BLOOD TRANSFUSION? Blood Transfusion Information  A transfusion is the replacement of blood or some of its parts. Blood is made up of multiple cells which provide different functions.  Red blood cells carry oxygen and are used for blood loss replacement.  White blood cells fight against infection.  Platelets control bleeding.  Plasma helps clot blood.  Other blood products are available for specialized needs, such as hemophilia or other clotting disorders. BEFORE THE TRANSFUSION  Who gives blood for transfusions?   Healthy volunteers who are fully evaluated to make sure their blood is safe. This is blood bank blood. Transfusion therapy is the safest it has ever been in the practice of medicine. Before blood is taken from a donor, a complete history is taken to make sure that person has no history of diseases nor engages in risky social behavior (examples are intravenous drug use or sexual activity with multiple partners). The donor's travel history is screened to minimize risk of transmitting infections, such as malaria. The donated blood is tested for  signs of infectious diseases, such as HIV and hepatitis. The blood is then tested to be sure it is compatible with you in order to minimize the chance of a transfusion reaction. If you or a relative donates blood, this is often done in anticipation of surgery and is not appropriate for emergency situations. It takes many days to process the donated blood. RISKS AND COMPLICATIONS Although transfusion therapy is very safe and saves many lives, the main dangers of transfusion include:   Getting an infectious disease.  Developing a transfusion reaction. This is an allergic reaction to something in the blood you were given. Every precaution is taken to prevent this. The decision to have a blood transfusion has been considered carefully by your caregiver before blood is given. Blood is not given unless the benefits outweigh the risks. AFTER THE TRANSFUSION  Right after receiving a blood transfusion, you will usually feel much better and more energetic. This is especially true if your red blood cells have gotten low (anemic). The transfusion raises the level of the red blood cells which carry oxygen, and this usually causes an energy increase.  The nurse administering the transfusion will monitor you carefully for complications. HOME CARE INSTRUCTIONS  No special instructions are needed after a transfusion. You may find your energy is better. Speak with your caregiver about any limitations on activity for underlying diseases you may have. SEEK MEDICAL CARE IF:   Your condition is not improving after your transfusion.  You develop redness or irritation at the intravenous (IV) site. SEEK IMMEDIATE MEDICAL CARE IF:  Any of  the following symptoms occur over the next 12 hours:  Shaking chills.  You have a temperature by mouth above 102 F (38.9 C), not controlled by medicine.  Chest, back, or muscle pain.  People around you feel you are not acting correctly or are confused.  Shortness of breath  or difficulty breathing.  Dizziness and fainting.  You get a rash or develop hives.  You have a decrease in urine output.  Your urine turns a dark color or changes to pink, red, or brown. Any of the following symptoms occur over the next 10 days:  You have a temperature by mouth above 102 F (38.9 C), not controlled by medicine.  Shortness of breath.  Weakness after normal activity.  The white part of the eye turns yellow (jaundice).  You have a decrease in the amount of urine or are urinating less often.  Your urine turns a dark color or changes to pink, red, or brown. Document Released: 05/21/2000 Document Revised: 08/16/2011 Document Reviewed: 01/08/2008 [facility] Patient Information 2014 [place], [place].  _______________________________________________________________________

## 2018-03-03 ENCOUNTER — Other Ambulatory Visit: Payer: Self-pay

## 2018-03-03 ENCOUNTER — Encounter (HOSPITAL_COMMUNITY): Payer: Self-pay

## 2018-03-03 ENCOUNTER — Ambulatory Visit (HOSPITAL_COMMUNITY)
Admission: RE | Admit: 2018-03-03 | Discharge: 2018-03-03 | Disposition: A | Discharge status: Home / Self Care | Payer: Medicare HMO | Source: Ambulatory Visit | Attending: Physician Assistant | Admitting: Physician Assistant

## 2018-03-03 ENCOUNTER — Encounter (HOSPITAL_COMMUNITY)
Admission: RE | Admit: 2018-03-03 | Discharge: 2018-03-03 | Disposition: A | Discharge status: Home / Self Care | Payer: Medicare HMO | Source: Ambulatory Visit | Attending: Orthopedic Surgery | Admitting: Orthopedic Surgery

## 2018-03-03 DIAGNOSIS — E119 Type 2 diabetes mellitus without complications: Secondary | ICD-10-CM | POA: Diagnosis not present

## 2018-03-03 DIAGNOSIS — R9431 Abnormal electrocardiogram [ECG] [EKG]: Secondary | ICD-10-CM | POA: Insufficient documentation

## 2018-03-03 DIAGNOSIS — Z01818 Encounter for other preprocedural examination: Secondary | ICD-10-CM | POA: Diagnosis not present

## 2018-03-03 DIAGNOSIS — R001 Bradycardia, unspecified: Secondary | ICD-10-CM | POA: Diagnosis not present

## 2018-03-03 DIAGNOSIS — M109 Gout, unspecified: Secondary | ICD-10-CM | POA: Diagnosis not present

## 2018-03-03 DIAGNOSIS — I517 Cardiomegaly: Secondary | ICD-10-CM | POA: Insufficient documentation

## 2018-03-03 DIAGNOSIS — R918 Other nonspecific abnormal finding of lung field: Secondary | ICD-10-CM | POA: Diagnosis not present

## 2018-03-03 DIAGNOSIS — M1711 Unilateral primary osteoarthritis, right knee: Secondary | ICD-10-CM | POA: Insufficient documentation

## 2018-03-03 DIAGNOSIS — E7800 Pure hypercholesterolemia, unspecified: Secondary | ICD-10-CM | POA: Diagnosis not present

## 2018-03-03 DIAGNOSIS — I10 Essential (primary) hypertension: Secondary | ICD-10-CM | POA: Insufficient documentation

## 2018-03-03 DIAGNOSIS — Z01812 Encounter for preprocedural laboratory examination: Secondary | ICD-10-CM | POA: Insufficient documentation

## 2018-03-03 DIAGNOSIS — Z01810 Encounter for preprocedural cardiovascular examination: Secondary | ICD-10-CM | POA: Insufficient documentation

## 2018-03-03 HISTORY — DX: Cardiomegaly: I51.7

## 2018-03-03 LAB — COMPREHENSIVE METABOLIC PANEL
ALT: 12 U/L (ref 0–44)
AST: 14 U/L — ABNORMAL LOW (ref 15–41)
Albumin: 3.9 g/dL (ref 3.5–5.0)
Alkaline Phosphatase: 77 U/L (ref 38–126)
Anion gap: 10 (ref 5–15)
BILIRUBIN TOTAL: 0.5 mg/dL (ref 0.3–1.2)
BUN: 30 mg/dL — AB (ref 8–23)
CHLORIDE: 103 mmol/L (ref 98–111)
CO2: 31 mmol/L (ref 22–32)
CREATININE: 1.15 mg/dL — AB (ref 0.44–1.00)
Calcium: 9.8 mg/dL (ref 8.9–10.3)
GFR, EST AFRICAN AMERICAN: 52 mL/min — AB (ref >60)
GFR, EST NON AFRICAN AMERICAN: 45 mL/min — AB (ref >60)
Glucose, Bld: 96 mg/dL (ref 70–99)
POTASSIUM: 3.7 mmol/L (ref 3.5–5.1)
Sodium: 144 mmol/L (ref 135–145)
TOTAL PROTEIN: 7.2 g/dL (ref 6.5–8.1)

## 2018-03-03 LAB — CBC WITH DIFFERENTIAL/PLATELET
Basophils Absolute: 0 10*3/uL (ref 0.0–0.1)
Basophils Relative: 0 %
EOS PCT: 1 %
Eosinophils Absolute: 0.1 10*3/uL (ref 0.0–0.7)
HEMATOCRIT: 37.3 % (ref 36.0–46.0)
Hemoglobin: 12.1 g/dL (ref 12.0–15.0)
LYMPHS ABS: 2.5 10*3/uL (ref 0.7–4.0)
LYMPHS PCT: 29 %
MCH: 29.9 pg (ref 26.0–34.0)
MCHC: 32.4 g/dL (ref 30.0–36.0)
MCV: 92.1 fL (ref 78.0–100.0)
MONOS PCT: 5 %
Monocytes Absolute: 0.4 10*3/uL (ref 0.1–1.0)
NEUTROS ABS: 5.7 10*3/uL (ref 1.7–7.7)
Neutrophils Relative %: 65 %
PLATELETS: 265 10*3/uL (ref 150–400)
RBC: 4.05 MIL/uL (ref 3.87–5.11)
RDW: 15.2 % (ref 11.5–15.5)
WBC: 8.7 10*3/uL (ref 4.0–10.5)

## 2018-03-03 LAB — URINALYSIS, ROUTINE W REFLEX MICROSCOPIC
BILIRUBIN URINE: NEGATIVE
GLUCOSE, UA: NEGATIVE mg/dL
Hgb urine dipstick: NEGATIVE
KETONES UR: NEGATIVE mg/dL
LEUKOCYTES UA: NEGATIVE
NITRITE: NEGATIVE
PH: 7 (ref 5.0–8.0)
PROTEIN: NEGATIVE mg/dL
Specific Gravity, Urine: 1.014 (ref 1.005–1.030)

## 2018-03-03 LAB — GLUCOSE, CAPILLARY: GLUCOSE-CAPILLARY: 96 mg/dL (ref 70–99)

## 2018-03-03 LAB — PROTIME-INR
INR: 0.86
PROTHROMBIN TIME: 11.7 s (ref 11.4–15.2)

## 2018-03-03 LAB — SURGICAL PCR SCREEN
MRSA, PCR: NEGATIVE
Staphylococcus aureus: NEGATIVE

## 2018-03-03 LAB — APTT: aPTT: 32 seconds (ref 24–36)

## 2018-03-04 LAB — URINE CULTURE: Culture: <10000 — AB

## 2018-03-06 ENCOUNTER — Other Ambulatory Visit (HOSPITAL_COMMUNITY): Payer: Medicare HMO

## 2018-03-10 NOTE — Progress Notes (Signed)
Called pt in error to advise that surgery time was changed from 1:15PM to 1:45 PM on 03-15-18,. It appears that pt's surgery has been cancelled . Called pt to advise that message was left in error.   Also contacted [employer], scheduler for Dr. Madelon Lips to advise of the above situation, in the event pt contact Dr. Candise Bowens office. Left message on [facility] voicemail.

## 2018-03-15 ENCOUNTER — Inpatient Hospital Stay (HOSPITAL_COMMUNITY): Admission: RE | Admit: 2018-03-15 | Payer: Medicare HMO | Source: Ambulatory Visit | Admitting: Orthopedic Surgery

## 2018-03-15 ENCOUNTER — Encounter (HOSPITAL_COMMUNITY): Admission: RE | Payer: Self-pay | Source: Ambulatory Visit

## 2018-03-15 SURGERY — ARTHROPLASTY, KNEE, TOTAL
Anesthesia: Spinal | Site: Knee | Laterality: Right

## 2018-03-27 DIAGNOSIS — Z23 Encounter for immunization: Secondary | ICD-10-CM | POA: Diagnosis not present

## 2018-04-10 ENCOUNTER — Ambulatory Visit: Payer: Self-pay | Admitting: Physician Assistant

## 2018-04-10 NOTE — H&P (View-Only) (Signed)
TOTAL KNEE ADMISSION H&P  Patient is being admitted for right total knee arthroplasty.  Subjective:  Chief Complaint:right knee pain.  HPI: [name], 77 y.o. female, has a history of pain and functional disability in the right knee due to arthritis and has failed non-surgical conservative treatments for greater than 12 weeks to includeNSAID's and/or analgesics, corticosteriod injections, viscosupplementation injections, use of assistive devices and activity modification.  Onset of symptoms was gradual, starting >10 years ago with gradually worsening course since that time. The patient noted no past surgery on the right knee(s).  Patient currently rates pain in the right knee(s) at 10 out of 10 with activity. Patient has night pain, worsening of pain with activity and weight bearing, pain that interferes with activities of daily living, pain with passive range of motion, crepitus and joint swelling.  Patient has evidence of periarticular osteophytes and joint space narrowing by imaging studies.There is no active infection.      Patient Active Problem List   Diagnosis Date Noted  . Primary localized osteoarthritis of right knee 02/10/2018  . Acute viral syndrome 10/06/2016  . Weakness 10/06/2016  . Hypercholesteremia 10/06/2016  . Gout 10/06/2016  . Diabetes mellitus without complication (HCC) 10/06/2016  . Viral syndrome 10/06/2016  . Precordial chest pain 09/11/2016  . Diabetes (HCC) 05/03/2016  . HYPOKALEMIA 11/01/2008  . HYPERGLYCEMIA, FASTING 11/01/2008  . Type 2 diabetes mellitus with hyperlipidemia (HCC) 10/17/2008  . GOUT, UNSPECIFIED 10/17/2008  . OBESITY 10/17/2008  . MIGRAINE HEADACHE 10/17/2008  . Essential hypertension 10/17/2008  . INTERNAL HEMORRHOIDS 10/17/2008  . DIVERTICULOSIS, COLON 10/17/2008  . ARTHRITIS 10/17/2008       Past Medical History:  Diagnosis Date  . Diabetes mellitus without complication (HCC)   . Gout   . Hypercholesteremia   .  Hypertension          Past Surgical History:  Procedure Laterality Date  . ABDOMINAL HYSTERECTOMY             Current Outpatient Medications  Medication Sig Dispense Refill Last Dose  . acetaminophen (TYLENOL) 325 MG tablet Take 650 mg by mouth every 6 (six) hours as needed.   Taking  . allopurinol (ZYLOPRIM) 100 MG tablet Take 100 mg by mouth daily.   Taking  . amLODipine (NORVASC) 10 MG tablet Take 5 mg by mouth daily.    Taking  . chlorthalidone (HYGROTON) 25 MG tablet Take 12.5 mg by mouth daily.   Taking  . guaiFENesin-dextromethorphan (ROBITUSSIN DM) 100-10 MG/5ML syrup Take 5 mLs by mouth every 4 (four) hours as needed for cough. 118 mL 0 Taking  . ibuprofen (ADVIL,MOTRIN) 600 MG tablet Take 1 tablet (600 mg total) by mouth every 6 (six) hours as needed for fever. 30 tablet 0 Taking  . losartan (COZAAR) 100 MG tablet Take 100 mg by mouth daily.   Taking  . metFORMIN (GLUCOPHAGE) 1000 MG tablet Take 1,000 mg by mouth 2 (two) times daily with a meal.   Taking  . pantoprazole (PROTONIX) 40 MG tablet Take 1 tablet (40 mg total) by mouth daily. 30 tablet 2 Taking  . simvastatin (ZOCOR) 40 MG tablet Take 40 mg by mouth every evening.   Taking   No current facility-administered medications for this visit.    No Known Allergies  Social History       Tobacco Use  . Smoking status: Never Smoker  . Smokeless tobacco: Never Used  Substance Use Topics  . Alcohol use: No    No   family history on file.   Review of Systems  Cardiovascular: Positive for leg swelling.  Genitourinary: Positive for frequency.  Musculoskeletal: Positive for joint pain.  All other systems reviewed and are negative.   Objective:  Physical Exam  Constitutional: She is oriented to person, place, and time. She appears well-developed and well-nourished. No distress.  HENT:  Head: Normocephalic and atraumatic.  Nose: Nose normal.  Eyes: Pupils are equal, round, and reactive to  light. Conjunctivae and EOM are normal.  Neck: Normal range of motion. Neck supple.  Cardiovascular: Normal rate, regular rhythm, normal heart sounds and intact distal pulses.  Respiratory: Effort normal and breath sounds normal. No respiratory distress. She has no wheezes.  GI: Soft. Bowel sounds are normal. She exhibits no distension. There is no tenderness.  Musculoskeletal:       Right knee: She exhibits swelling and bony tenderness. She exhibits normal range of motion, no effusion and no laceration. Tenderness found. Medial joint line and lateral joint line tenderness noted.  Lymphadenopathy:    She has no cervical adenopathy.  Neurological: She is alert and oriented to person, place, and time. No cranial nerve deficit.  Skin: Skin is warm and dry. No rash noted. No erythema.  Psychiatric: She has a normal mood and affect. Her behavior is normal.    Vital signs in last 24 hours: @VSRANGES@  Labs:   Estimated body mass index is 34.75 kg/m as calculated from the following:   Height as of 10/22/16: 5' 2" (1.575 m).   Weight as of 10/22/16: 86.2 kg.   Imaging Review Plain radiographs demonstrate severe degenerative joint disease of the right knee(s). The overall alignment issignificant valgus. The bone quality appears to be good for age and reported activity level.   Preoperative templating of the joint replacement has been completed, documented, and submitted to the Operating Room personnel in order to optimize intra-operative equipment management.   Anticipated LOS equal to or greater than 2 midnights due to - Age 65 and older with one or more of the following:             - Obesity             - Expected need for hospital services (PT, OT, Nursing) required for safe   discharge             - Anticipated need for postoperative skilled nursing care or inpatient rehab             - Active co-morbidities: Diabetes OR   - Unanticipated findings during/Post Surgery:  None  - Patient is a high risk of re-admission due to: None     Assessment/Plan:  End stage arthritis, right knee   The patient history, physical examination, clinical judgment of the provider and imaging studies are consistent with end stage degenerative joint disease of the right knee(s) and total knee arthroplasty is deemed medically necessary. The treatment options including medical management, injection therapy arthroscopy and arthroplasty were discussed at length. The risks and benefits of total knee arthroplasty were presented and reviewed. The risks due to aseptic loosening, infection, stiffness, patella tracking problems, thromboembolic complications and other imponderables were discussed. The patient acknowledged the explanation, agreed to proceed with the plan and consent was signed. Patient is being admitted for inpatient treatment for surgery, pain control, PT, OT, prophylactic antibiotics, VTE prophylaxis, progressive ambulation and ADL's and discharge planning. The patient is planning to be discharged home with home health services  

## 2018-04-10 NOTE — H&P (Signed)
TOTAL KNEE ADMISSION H&P  Patient is being admitted for right total knee arthroplasty.  Subjective:  Chief Complaint:right knee pain.  HPI: [name], 77 y.o. female, has a history of pain and functional disability in the right knee due to arthritis and has failed non-surgical conservative treatments for greater than 12 weeks to includeNSAID's and/or analgesics, corticosteriod injections, viscosupplementation injections, use of assistive devices and activity modification.  Onset of symptoms was gradual, starting >10 years ago with gradually worsening course since that time. The patient noted no past surgery on the right knee(s).  Patient currently rates pain in the right knee(s) at 10 out of 10 with activity. Patient has night pain, worsening of pain with activity and weight bearing, pain that interferes with activities of daily living, pain with passive range of motion, crepitus and joint swelling.  Patient has evidence of periarticular osteophytes and joint space narrowing by imaging studies.There is no active infection.      Patient Active Problem List   Diagnosis Date Noted  . Primary localized osteoarthritis of right knee 02/10/2018  . Acute viral syndrome 10/06/2016  . Weakness 10/06/2016  . Hypercholesteremia 10/06/2016  . Gout 10/06/2016  . Diabetes mellitus without complication (HCC) 10/06/2016  . Viral syndrome 10/06/2016  . Precordial chest pain 09/11/2016  . Diabetes (HCC) 05/03/2016  . HYPOKALEMIA 11/01/2008  . HYPERGLYCEMIA, FASTING 11/01/2008  . Type 2 diabetes mellitus with hyperlipidemia (HCC) 10/17/2008  . GOUT, UNSPECIFIED 10/17/2008  . OBESITY 10/17/2008  . MIGRAINE HEADACHE 10/17/2008  . Essential hypertension 10/17/2008  . INTERNAL HEMORRHOIDS 10/17/2008  . DIVERTICULOSIS, COLON 10/17/2008  . ARTHRITIS 10/17/2008       Past Medical History:  Diagnosis Date  . Diabetes mellitus without complication (HCC)   . Gout   . Hypercholesteremia   .  Hypertension          Past Surgical History:  Procedure Laterality Date  . ABDOMINAL HYSTERECTOMY             Current Outpatient Medications  Medication Sig Dispense Refill Last Dose  . acetaminophen (TYLENOL) 325 MG tablet Take 650 mg by mouth every 6 (six) hours as needed.   Taking  . allopurinol (ZYLOPRIM) 100 MG tablet Take 100 mg by mouth daily.   Taking  . amLODipine (NORVASC) 10 MG tablet Take 5 mg by mouth daily.    Taking  . chlorthalidone (HYGROTON) 25 MG tablet Take 12.5 mg by mouth daily.   Taking  . guaiFENesin-dextromethorphan (ROBITUSSIN DM) 100-10 MG/5ML syrup Take 5 mLs by mouth every 4 (four) hours as needed for cough. 118 mL 0 Taking  . ibuprofen (ADVIL,MOTRIN) 600 MG tablet Take 1 tablet (600 mg total) by mouth every 6 (six) hours as needed for fever. 30 tablet 0 Taking  . losartan (COZAAR) 100 MG tablet Take 100 mg by mouth daily.   Taking  . metFORMIN (GLUCOPHAGE) 1000 MG tablet Take 1,000 mg by mouth 2 (two) times daily with a meal.   Taking  . pantoprazole (PROTONIX) 40 MG tablet Take 1 tablet (40 mg total) by mouth daily. 30 tablet 2 Taking  . simvastatin (ZOCOR) 40 MG tablet Take 40 mg by mouth every evening.   Taking   No current facility-administered medications for this visit.    No Known Allergies  Social History       Tobacco Use  . Smoking status: Never Smoker  . Smokeless tobacco: Never Used  Substance Use Topics  . Alcohol use: No    No  family history on file.   Review of Systems  Cardiovascular: Positive for leg swelling.  Genitourinary: Positive for frequency.  Musculoskeletal: Positive for joint pain.  All other systems reviewed and are negative.   Objective:  Physical Exam  Constitutional: She is oriented to person, place, and time. She appears well-developed and well-nourished. No distress.  HENT:  Head: Normocephalic and atraumatic.  Nose: Nose normal.  Eyes: Pupils are equal, round, and reactive to  light. Conjunctivae and EOM are normal.  Neck: Normal range of motion. Neck supple.  Cardiovascular: Normal rate, regular rhythm, normal heart sounds and intact distal pulses.  Respiratory: Effort normal and breath sounds normal. No respiratory distress. She has no wheezes.  GI: Soft. Bowel sounds are normal. She exhibits no distension. There is no tenderness.  Musculoskeletal:       Right knee: She exhibits swelling and bony tenderness. She exhibits normal range of motion, no effusion and no laceration. Tenderness found. Medial joint line and lateral joint line tenderness noted.  Lymphadenopathy:    She has no cervical adenopathy.  Neurological: She is alert and oriented to person, place, and time. No cranial nerve deficit.  Skin: Skin is warm and dry. No rash noted. No erythema.  Psychiatric: She has a normal mood and affect. Her behavior is normal.    Vital signs in last 24 hours: @VSRANGES @  Labs:   Estimated body mass index is 34.75 kg/m as calculated from the following:   Height as of 10/22/16: 5\' 2"  (1.575 m).   Weight as of 10/22/16: 86.2 kg.   Imaging Review Plain radiographs demonstrate severe degenerative joint disease of the right knee(s). The overall alignment issignificant valgus. The bone quality appears to be good for age and reported activity level.   Preoperative templating of the joint replacement has been completed, documented, and submitted to the Operating Room personnel in order to optimize intra-operative equipment management.   Anticipated Black equal to or greater than 2 midnights due to - Age 64 and older with one or more of the following:             - Obesity             - Expected need for hospital services (PT, OT, Nursing) required for safe   discharge             - Anticipated need for postoperative skilled nursing care or inpatient rehab             - Active co-morbidities: Diabetes OR   - Unanticipated findings during/Post Surgery:  None  - Patient is a high risk of re-admission due to: None     Assessment/Plan:  End stage arthritis, right knee   The patient history, physical examination, clinical judgment of the provider and imaging studies are consistent with end stage degenerative joint disease of the right knee(s) and total knee arthroplasty is deemed medically necessary. The treatment options including medical management, injection therapy arthroscopy and arthroplasty were discussed at length. The risks and benefits of total knee arthroplasty were presented and reviewed. The risks due to aseptic loosening, infection, stiffness, patella tracking problems, thromboembolic complications and other imponderables were discussed. The patient acknowledged the explanation, agreed to proceed with the plan and consent was signed. Patient is being admitted for inpatient treatment for surgery, pain control, PT, OT, prophylactic antibiotics, VTE prophylaxis, progressive ambulation and ADL's and discharge planning. The patient is planning to be discharged home with home health services

## 2018-04-12 ENCOUNTER — Encounter (HOSPITAL_COMMUNITY): Payer: Self-pay

## 2018-04-12 NOTE — Patient Instructions (Signed)
Your procedure is scheduled on: Friday, Nov. 15, 2019   Surgery Time:  7:30AM-9:50AM   Report to [facility] Main  Entrance    Report to admitting at 5:30 AM   Call this number if you have problems the morning of surgery [phone]   Do not eat food or drink liquids :After Midnight.   Brush your teeth the morning of surgery.   Do NOT smoke after Midnight   Take these medicines the morning of surgery with A SIP OF WATER: Allopurinol, Amlodipine, Propranolol  DO NOT TAKE ANY DIABETIC MEDICATIONS DAY OF YOUR SURGERY                               You may not have any metal on your body including hair pins, jewelry, and body piercings             Do not wear make-up, lotions, powders, perfumes/cologne, or deodorant             Do not wear nail polish.  Do not shave  48 hours prior to surgery.          Do not bring valuables to the hospital. [facility] IS NOT             RESPONSIBLE   FOR VALUABLES.   Contacts, dentures or bridgework may not be worn into surgery.   Leave suitcase in the car. After surgery it may be brought to your room.   Special Instructions: Bring a copy of your healthcare power of attorney and living will documents         the day of surgery if you haven't scanned them in before.              Please read over the following fact sheets you were given:  [facility] - Preparing for Surgery Before surgery, you can play an important role.  Because skin is not sterile, your skin needs to be as free of germs as possible.  You can reduce the number of germs on your skin by washing with CHG (chlorahexidine gluconate) soap before surgery.  CHG is an antiseptic cleaner which kills germs and bonds with the skin to continue killing germs even after washing. Please DO NOT use if you have an allergy to CHG or antibacterial soaps.  If your skin becomes reddened/irritated stop using the CHG and inform your nurse when you arrive at Short Stay. Do not shave  (including legs and underarms) for at least 48 hours prior to the first CHG shower.  You may shave your face/neck.  Please follow these instructions carefully:  1.  Shower with CHG Soap the night before surgery and the  morning of surgery.  2.  If you choose to wash your hair, wash your hair first as usual with your normal  shampoo.  3.  After you shampoo, rinse your hair and body thoroughly to remove the shampoo.                             4.  Use CHG as you would any other liquid soap.  You can apply chg directly to the skin and wash.  Gently with a scrungie or clean washcloth.  5.  Apply the CHG Soap to your body ONLY FROM THE NECK DOWN.   Do   not use on face/ open  Wound or open sores. Avoid contact with eyes, ears mouth and   genitals (private parts).                       Wash face,  Genitals (private parts) with your normal soap.             6.  Wash thoroughly, paying special attention to the area where your    surgery  will be performed.  7.  Thoroughly rinse your body with warm water from the neck down.  8.  DO NOT shower/wash with your normal soap after using and rinsing off the CHG Soap.                9.  Pat yourself dry with a clean towel.            10.  Wear clean pajamas.            11.  Place clean sheets on your bed the night of your first shower and do not  sleep with pets. Day of Surgery : Do not apply any lotions/deodorants the morning of surgery.  Please wear clean clothes to the hospital/surgery center.  FAILURE TO FOLLOW THESE INSTRUCTIONS MAY RESULT IN THE CANCELLATION OF YOUR SURGERY  PATIENT SIGNATURE_________________________________  NURSE SIGNATURE__________________________________  ________________________________________________________________________   [name]  An incentive spirometer is a tool that can help keep your lungs clear and active. This tool measures how well you are filling your lungs with each breath.  Taking long deep breaths may help reverse or decrease the chance of developing breathing (pulmonary) problems (especially infection) following:  A long period of time when you are unable to move or be active. BEFORE THE PROCEDURE   If the spirometer includes an indicator to show your best effort, your nurse or respiratory therapist will set it to a desired goal.  If possible, sit up straight or lean slightly forward. Try not to slouch.  Hold the incentive spirometer in an upright position. INSTRUCTIONS FOR USE  1. Sit on the edge of your bed if possible, or sit up as far as you can in bed or on a chair. 2. Hold the incentive spirometer in an upright position. 3. Breathe out normally. 4. Place the mouthpiece in your mouth and seal your lips tightly around it. 5. Breathe in slowly and as deeply as possible, raising the piston or the ball toward the top of the column. 6. Hold your breath for 3-5 seconds or for as long as possible. Allow the piston or ball to fall to the bottom of the column. 7. Remove the mouthpiece from your mouth and breathe out normally. 8. Rest for a few seconds and repeat Steps 1 through 7 at least 10 times every 1-2 hours when you are awake. Take your time and take a few normal breaths between deep breaths. 9. The spirometer may include an indicator to show your best effort. Use the indicator as a goal to work toward during each repetition. 10. After each set of 10 deep breaths, practice coughing to be sure your lungs are clear. If you have an incision (the cut made at the time of surgery), support your incision when coughing by placing a pillow or rolled up towels firmly against it. Once you are able to get out of bed, walk around indoors and cough well. You may stop using the incentive spirometer when instructed by your caregiver.  RISKS AND COMPLICATIONS  Take your time  so you do not get dizzy or light-headed.  If you are in pain, you may need to take or ask for pain  medication before doing incentive spirometry. It is harder to take a deep breath if you are having pain. AFTER USE  Rest and breathe slowly and easily.  It can be helpful to keep track of a log of your progress. Your caregiver can provide you with a simple table to help with this. If you are using the spirometer at home, follow these instructions: [name] IF:   You are having difficultly using the spirometer.  You have trouble using the spirometer as often as instructed.  Your pain medication is not giving enough relief while using the spirometer.  You develop fever of 100.5 F (38.1 C) or higher. SEEK IMMEDIATE MEDICAL CARE IF:   You cough up bloody sputum that had not been present before.  You develop fever of 102 F (38.9 C) or greater.  You develop worsening pain at or near the incision site. MAKE SURE YOU:   Understand these instructions.  Will watch your condition.  Will get help right away if you are not doing well or get worse. Document Released: 10/04/2006 Document Revised: 08/16/2011 Document Reviewed: 12/05/2006 Exit[employer] Patient Information 2014 [employer], [place].   ________________________________________________________________________  WHAT IS A BLOOD TRANSFUSION? Blood Transfusion Information  A transfusion is the replacement of blood or some of its parts. Blood is made up of multiple cells which provide different functions.  Red blood cells carry oxygen and are used for blood loss replacement.  White blood cells fight against infection.  Platelets control bleeding.  Plasma helps clot blood.  Other blood products are available for specialized needs, such as hemophilia or other clotting disorders. BEFORE THE TRANSFUSION  Who gives blood for transfusions?   Healthy volunteers who are fully evaluated to make sure their blood is safe. This is blood bank blood. Transfusion therapy is the safest it has ever been in the practice of medicine.  Before blood is taken from a donor, a complete history is taken to make sure that person has no history of diseases nor engages in risky social behavior (examples are intravenous drug use or sexual activity with multiple partners). The donor's travel history is screened to minimize risk of transmitting infections, such as malaria. The donated blood is tested for signs of infectious diseases, such as HIV and hepatitis. The blood is then tested to be sure it is compatible with you in order to minimize the chance of a transfusion reaction. If you or a relative donates blood, this is often done in anticipation of surgery and is not appropriate for emergency situations. It takes many days to process the donated blood. RISKS AND COMPLICATIONS Although transfusion therapy is very safe and saves many lives, the main dangers of transfusion include:   Getting an infectious disease.  Developing a transfusion reaction. This is an allergic reaction to something in the blood you were given. Every precaution is taken to prevent this. The decision to have a blood transfusion has been considered carefully by your caregiver before blood is given. Blood is not given unless the benefits outweigh the risks. AFTER THE TRANSFUSION  Right after receiving a blood transfusion, you will usually feel much better and more energetic. This is especially true if your red blood cells have gotten low (anemic). The transfusion raises the level of the red blood cells which carry oxygen, and this usually causes an energy increase.  The  nurse administering the transfusion will monitor you carefully for complications. HOME CARE INSTRUCTIONS  No special instructions are needed after a transfusion. You may find your energy is better. Speak with your caregiver about any limitations on activity for underlying diseases you may have. SEEK MEDICAL CARE IF:   Your condition is not improving after your transfusion.  You develop redness or  irritation at the intravenous (IV) site. SEEK IMMEDIATE MEDICAL CARE IF:  Any of the following symptoms occur over the next 12 hours:  Shaking chills.  You have a temperature by mouth above 102 F (38.9 C), not controlled by medicine.  Chest, back, or muscle pain.  People around you feel you are not acting correctly or are confused.  Shortness of breath or difficulty breathing.  Dizziness and fainting.  You get a rash or develop hives.  You have a decrease in urine output.  Your urine turns a dark color or changes to pink, red, or brown. Any of the following symptoms occur over the next 10 days:  You have a temperature by mouth above 102 F (38.9 C), not controlled by medicine.  Shortness of breath.  Weakness after normal activity.  The white part of the eye turns yellow (jaundice).  You have a decrease in the amount of urine or are urinating less often.  Your urine turns a dark color or changes to pink, red, or brown. Document Released: 05/21/2000 Document Revised: 08/16/2011 Document Reviewed: 01/08/2008 [facility] Patient Information 2014 [place], [place].  _______________________________________________________________________

## 2018-04-14 ENCOUNTER — Encounter (HOSPITAL_COMMUNITY)
Admission: RE | Admit: 2018-04-14 | Discharge: 2018-04-14 | Disposition: A | Discharge status: Home / Self Care | Payer: Medicare HMO | Source: Ambulatory Visit | Attending: Orthopedic Surgery | Admitting: Orthopedic Surgery

## 2018-04-14 ENCOUNTER — Encounter (HOSPITAL_COMMUNITY): Payer: Self-pay

## 2018-04-14 ENCOUNTER — Other Ambulatory Visit: Payer: Self-pay

## 2018-04-14 ENCOUNTER — Ambulatory Visit: Payer: Self-pay | Admitting: Physician Assistant

## 2018-04-14 DIAGNOSIS — Z01818 Encounter for other preprocedural examination: Secondary | ICD-10-CM | POA: Diagnosis not present

## 2018-04-14 DIAGNOSIS — E119 Type 2 diabetes mellitus without complications: Secondary | ICD-10-CM | POA: Insufficient documentation

## 2018-04-14 HISTORY — DX: Diverticulosis of intestine, part unspecified, without perforation or abscess without bleeding: K57.90

## 2018-04-14 HISTORY — DX: Atherosclerosis of aorta: I70.0

## 2018-04-14 HISTORY — DX: Other specified diseases of liver: K76.89

## 2018-04-14 HISTORY — DX: Aneurysm of renal artery: I72.2

## 2018-04-14 HISTORY — DX: Synovial cyst of popliteal space (Baker), left knee: M71.22

## 2018-04-14 HISTORY — DX: Other hemorrhoids: K64.8

## 2018-04-14 HISTORY — DX: Obesity, unspecified: E66.9

## 2018-04-14 HISTORY — DX: Cyst of kidney, acquired: N28.1

## 2018-04-14 HISTORY — DX: Other tear of lateral meniscus, current injury, unspecified knee, initial encounter: S83.289A

## 2018-04-14 HISTORY — DX: Unspecified osteoarthritis, unspecified site: M19.90

## 2018-04-14 HISTORY — DX: Personal history of diseases of the blood and blood-forming organs and certain disorders involving the immune mechanism: Z86.2

## 2018-04-14 HISTORY — DX: Nausea with vomiting, unspecified: R11.2

## 2018-04-14 HISTORY — DX: Thoracic aortic aneurysm, without rupture: I71.2

## 2018-04-14 HISTORY — DX: Personal history of other diseases of the nervous system and sense organs: Z86.69

## 2018-04-14 HISTORY — DX: Pneumonia, unspecified organism: J18.9

## 2018-04-14 HISTORY — DX: Unspecified cataract: H26.9

## 2018-04-14 HISTORY — DX: Anesthesia of skin: R20.0

## 2018-04-14 HISTORY — DX: Heart disease, unspecified: I51.9

## 2018-04-14 HISTORY — DX: Other specified postprocedural states: Z98.890

## 2018-04-14 HISTORY — DX: Cardiomegaly: I51.7

## 2018-04-14 HISTORY — DX: Postmenopausal bleeding: N95.0

## 2018-04-14 LAB — SURGICAL PCR SCREEN
MRSA, PCR: NEGATIVE
Staphylococcus aureus: NEGATIVE

## 2018-04-14 LAB — CBC
HCT: 38.1 % (ref 36.0–46.0)
Hemoglobin: 11.9 g/dL — ABNORMAL LOW (ref 12.0–15.0)
MCH: 29.5 pg (ref 26.0–34.0)
MCHC: 31.2 g/dL (ref 30.0–36.0)
MCV: 94.3 fL (ref 80.0–100.0)
Platelets: 260 10*3/uL (ref 150–400)
RBC: 4.04 MIL/uL (ref 3.87–5.11)
RDW: 14.5 % (ref 11.5–15.5)
WBC: 7.2 10*3/uL (ref 4.0–10.5)
nRBC: 0 % (ref 0.0–0.2)

## 2018-04-14 LAB — ABO/RH: ABO/RH(D): O NEG

## 2018-04-14 LAB — HEMOGLOBIN A1C
HEMOGLOBIN A1C: 6 % — AB (ref 4.8–5.6)
MEAN PLASMA GLUCOSE: 125.5 mg/dL

## 2018-04-14 LAB — BASIC METABOLIC PANEL
Anion gap: 9 (ref 5–15)
BUN: 25 mg/dL — ABNORMAL HIGH (ref 8–23)
CALCIUM: 9.6 mg/dL (ref 8.9–10.3)
CO2: 29 mmol/L (ref 22–32)
CREATININE: 1 mg/dL (ref 0.44–1.00)
Chloride: 105 mmol/L (ref 98–111)
GFR calc Af Amer: >60 mL/min (ref >60)
GFR, EST NON AFRICAN AMERICAN: 53 mL/min — AB (ref >60)
GLUCOSE: 112 mg/dL — AB (ref 70–99)
Potassium: 4.2 mmol/L (ref 3.5–5.1)
SODIUM: 143 mmol/L (ref 135–145)

## 2018-04-14 LAB — GLUCOSE, CAPILLARY: GLUCOSE-CAPILLARY: 104 mg/dL — AB (ref 70–99)

## 2018-04-20 MED ORDER — BUPIVACAINE LIPOSOME 1.3 % IJ SUSP
20.0000 mL | INTRAMUSCULAR | Status: DC
  Filled 2018-04-20: qty 20

## 2018-04-20 MED ORDER — TRANEXAMIC ACID 1000 MG/10ML IV SOLN
2000.0000 mg | INTRAVENOUS | Status: DC
  Filled 2018-04-20: qty 20

## 2018-04-20 NOTE — Anesthesia Preprocedure Evaluation (Addendum)
Anesthesia Evaluation  Patient identified by MRN, date of birth, ID band Patient awake    Reviewed: Allergy & Precautions, H&P , NPO status , Patient's Chart, lab work & pertinent test results  History of Anesthesia Complications (+) PONV  Airway Mallampati: II  TM Distance: >3 FB Neck ROM: Full    Dental no notable dental hx. (+) Teeth Intact, Dental Advisory Given   Pulmonary    Pulmonary exam normal breath sounds clear to auscultation       Cardiovascular hypertension, Pt. on medications Normal cardiovascular exam Rhythm:Regular Rate:Normal  ECHO 09/12/16 Left ventricle: The cavity size was normal. There was mild   concentric hypertrophy. Systolic function was normal. The   estimated ejection fraction was in the range of 55% to 60%. Wall   motion was normal; there were no regional wall motion   abnormalities. Doppler parameters are consistent with abnormal   left ventricular relaxation (grade 1 diastolic dysfunction). - Mitral valve: Mildly calcified annulus.  EKG 03/03/18 SB w LVH   Neuro/Psych  Headaches, negative psych ROS   GI/Hepatic Neg liver ROS, GERD  Medicated and Controlled,  Endo/Other  diabetes, Well Controlled, Type 2  Renal/GU Renal disease     Musculoskeletal  (+) Arthritis ,   Abdominal (+) + obese,   Peds  Hematology negative hematology ROS (+)   Anesthesia Other Findings   Reproductive/Obstetrics                            Lab Results  Component Value Date   WBC 7.2 04/14/2018   HGB 11.9 (L) 04/14/2018   HCT 38.1 04/14/2018   MCV 94.3 04/14/2018   PLT 260 04/14/2018   Lab Results  Component Value Date   CREATININE 1.00 04/14/2018   BUN 25 (H) 04/14/2018   NA 143 04/14/2018   K 4.2 04/14/2018   CL 105 04/14/2018   CO2 29 04/14/2018    Anesthesia Physical Anesthesia Plan  ASA: III  Anesthesia Plan: Spinal   Post-op Pain Management:  Regional for  Post-op pain   Induction:   PONV Risk Score and Plan: 2 and Treatment may vary due to age or medical condition  Airway Management Planned: Natural Airway  Additional Equipment:   Intra-op Plan:   Post-operative Plan:   Informed Consent: I have reviewed the patients History and Physical, chart, labs and discussed the procedure including the risks, benefits and alternatives for the proposed anesthesia with the patient or authorized representative who has indicated his/her understanding and acceptance.   Dental advisory given  Plan Discussed with:   Anesthesia Plan Comments:        Anesthesia Quick Evaluation

## 2018-04-21 ENCOUNTER — Ambulatory Visit (HOSPITAL_COMMUNITY): Payer: Medicare HMO | Admitting: Certified Registered Nurse Anesthetist

## 2018-04-21 ENCOUNTER — Encounter (HOSPITAL_COMMUNITY)
Admission: AD | Disposition: A | Discharge status: Home / Self Care | Payer: Self-pay | Source: Ambulatory Visit | Attending: Orthopedic Surgery

## 2018-04-21 ENCOUNTER — Other Ambulatory Visit: Payer: Self-pay

## 2018-04-21 ENCOUNTER — Encounter (HOSPITAL_COMMUNITY): Payer: Self-pay | Admitting: *Deleted

## 2018-04-21 ENCOUNTER — Inpatient Hospital Stay (HOSPITAL_COMMUNITY)
Admission: AD | Admit: 2018-04-21 | Discharge: 2018-04-25 | DRG: 469 | Disposition: A | Discharge status: Home Health | Payer: Medicare HMO | Source: Ambulatory Visit | Attending: Orthopedic Surgery | Admitting: Orthopedic Surgery

## 2018-04-21 DIAGNOSIS — Z90710 Acquired absence of both cervix and uterus: Secondary | ICD-10-CM | POA: Diagnosis not present

## 2018-04-21 DIAGNOSIS — E669 Obesity, unspecified: Secondary | ICD-10-CM | POA: Diagnosis present

## 2018-04-21 DIAGNOSIS — R5383 Other fatigue: Secondary | ICD-10-CM | POA: Diagnosis not present

## 2018-04-21 DIAGNOSIS — G9340 Encephalopathy, unspecified: Secondary | ICD-10-CM | POA: Diagnosis present

## 2018-04-21 DIAGNOSIS — E119 Type 2 diabetes mellitus without complications: Secondary | ICD-10-CM | POA: Diagnosis present

## 2018-04-21 DIAGNOSIS — I10 Essential (primary) hypertension: Secondary | ICD-10-CM | POA: Diagnosis present

## 2018-04-21 DIAGNOSIS — M1711 Unilateral primary osteoarthritis, right knee: Principal | ICD-10-CM | POA: Diagnosis present

## 2018-04-21 DIAGNOSIS — R0689 Other abnormalities of breathing: Secondary | ICD-10-CM | POA: Diagnosis not present

## 2018-04-21 DIAGNOSIS — M21061 Valgus deformity, not elsewhere classified, right knee: Secondary | ICD-10-CM | POA: Diagnosis present

## 2018-04-21 DIAGNOSIS — G92 Toxic encephalopathy: Secondary | ICD-10-CM | POA: Diagnosis not present

## 2018-04-21 DIAGNOSIS — G8918 Other acute postprocedural pain: Secondary | ICD-10-CM | POA: Diagnosis not present

## 2018-04-21 DIAGNOSIS — Z6834 Body mass index (BMI) 34.0-34.9, adult: Secondary | ICD-10-CM

## 2018-04-21 DIAGNOSIS — T50995A Adverse effect of other drugs, medicaments and biological substances, initial encounter: Secondary | ICD-10-CM | POA: Diagnosis not present

## 2018-04-21 DIAGNOSIS — E7800 Pure hypercholesterolemia, unspecified: Secondary | ICD-10-CM | POA: Diagnosis present

## 2018-04-21 DIAGNOSIS — E785 Hyperlipidemia, unspecified: Secondary | ICD-10-CM | POA: Diagnosis present

## 2018-04-21 DIAGNOSIS — Z96651 Presence of right artificial knee joint: Secondary | ICD-10-CM | POA: Diagnosis not present

## 2018-04-21 DIAGNOSIS — M109 Gout, unspecified: Secondary | ICD-10-CM | POA: Diagnosis present

## 2018-04-21 DIAGNOSIS — Z8701 Personal history of pneumonia (recurrent): Secondary | ICD-10-CM

## 2018-04-21 HISTORY — PX: TOTAL KNEE ARTHROPLASTY: SHX125

## 2018-04-21 LAB — TYPE AND SCREEN
ABO/RH(D): O NEG
ANTIBODY SCREEN: NEGATIVE

## 2018-04-21 LAB — GLUCOSE, CAPILLARY
GLUCOSE-CAPILLARY: 108 mg/dL — AB (ref 70–99)
GLUCOSE-CAPILLARY: 127 mg/dL — AB (ref 70–99)
GLUCOSE-CAPILLARY: 145 mg/dL — AB (ref 70–99)
Glucose-Capillary: 81 mg/dL (ref 70–99)
Glucose-Capillary: 80 mg/dL (ref 70–99)
Glucose-Capillary: 55 mg/dL — ABNORMAL LOW (ref 70–99)
Glucose-Capillary: 130 mg/dL — ABNORMAL HIGH (ref 70–99)

## 2018-04-21 SURGERY — ARTHROPLASTY, KNEE, TOTAL
Anesthesia: Spinal | Site: Knee | Laterality: Right

## 2018-04-21 MED ORDER — ASPIRIN 81 MG PO CHEW
81.0000 mg | CHEWABLE_TABLET | Freq: Two times a day (BID) | ORAL | Status: DC
  Administered 2018-04-21 – 2018-04-25 (×8): 81 mg via ORAL
  Filled 2018-04-21 (×8): qty 1

## 2018-04-21 MED ORDER — CHLORHEXIDINE GLUCONATE 4 % EX LIQD: 60.0000 mL | Freq: Once | CUTANEOUS | Status: DC

## 2018-04-21 MED ORDER — METOCLOPRAMIDE HCL 5 MG/ML IJ SOLN: 5.0000 mg | Freq: Three times a day (TID) | INTRAMUSCULAR | Status: DC | PRN

## 2018-04-21 MED ORDER — EPHEDRINE SULFATE-NACL 50-0.9 MG/10ML-% IV SOSY
PREFILLED_SYRINGE | INTRAVENOUS | Status: DC | PRN
  Administered 2018-04-21 (×3): 5 mg via INTRAVENOUS
  Administered 2018-04-21: 10 mg via INTRAVENOUS
  Administered 2018-04-21 (×4): 5 mg via INTRAVENOUS

## 2018-04-21 MED ORDER — LACTATED RINGERS IV SOLN
INTRAVENOUS | Status: DC
  Administered 2018-04-21 (×2): via INTRAVENOUS

## 2018-04-21 MED ORDER — HYDROMORPHONE HCL 1 MG/ML IJ SOLN
0.5000 mg | INTRAMUSCULAR | Status: DC | PRN
  Administered 2018-04-21 (×2): 0.5 mg via INTRAVENOUS
  Filled 2018-04-21 (×2): qty 0.5

## 2018-04-21 MED ORDER — TRANEXAMIC ACID 1000 MG/10ML IV SOLN
INTRAVENOUS | Status: DC | PRN
  Administered 2018-04-21: 2000 mg via TOPICAL

## 2018-04-21 MED ORDER — FENTANYL CITRATE (PF) 100 MCG/2ML IJ SOLN
INTRAMUSCULAR | Status: AC
  Filled 2018-04-21: qty 2

## 2018-04-21 MED ORDER — SODIUM CHLORIDE 0.9 % IV SOLN
INTRAVENOUS | Status: DC
  Administered 2018-04-21 – 2018-04-23 (×3): via INTRAVENOUS

## 2018-04-21 MED ORDER — CHLORTHALIDONE 25 MG PO TABS
12.5000 mg | ORAL_TABLET | Freq: Every day | ORAL | Status: DC
  Administered 2018-04-22 – 2018-04-25 (×4): 12.5 mg via ORAL
  Filled 2018-04-21 (×2): qty 0.5
  Filled 2018-04-21 (×2): qty 1

## 2018-04-21 MED ORDER — ONDANSETRON HCL 4 MG/2ML IJ SOLN
INTRAMUSCULAR | Status: DC | PRN
  Administered 2018-04-21: 4 mg via INTRAVENOUS

## 2018-04-21 MED ORDER — TRANEXAMIC ACID-NACL 1000-0.7 MG/100ML-% IV SOLN: 1000.0000 mg | INTRAVENOUS | Status: DC

## 2018-04-21 MED ORDER — BUPIVACAINE LIPOSOME 1.3 % IJ SUSP
INTRAMUSCULAR | Status: DC | PRN
  Administered 2018-04-21: 20 mL

## 2018-04-21 MED ORDER — PROPOFOL 10 MG/ML IV BOLUS
INTRAVENOUS | Status: AC
  Filled 2018-04-21: qty 40

## 2018-04-21 MED ORDER — MENTHOL 3 MG MT LOZG: 1.0000 | LOZENGE | OROMUCOSAL | Status: DC | PRN

## 2018-04-21 MED ORDER — GABAPENTIN 300 MG PO CAPS
300.0000 mg | ORAL_CAPSULE | Freq: Three times a day (TID) | ORAL | Status: DC
  Administered 2018-04-21 (×3): 300 mg via ORAL
  Filled 2018-04-21 (×3): qty 1

## 2018-04-21 MED ORDER — WATER FOR IRRIGATION, STERILE IR SOLN
Status: DC | PRN
  Administered 2018-04-21: 2000 mL via SURGICAL_CAVITY

## 2018-04-21 MED ORDER — SODIUM CHLORIDE (PF) 0.9 % IJ SOLN
INTRAMUSCULAR | Status: DC | PRN
  Administered 2018-04-21: 50 mL

## 2018-04-21 MED ORDER — PROPOFOL 10 MG/ML IV BOLUS
INTRAVENOUS | Status: AC
  Filled 2018-04-21: qty 20

## 2018-04-21 MED ORDER — SODIUM CHLORIDE 0.9 % IR SOLN
Status: DC | PRN
  Administered 2018-04-21: 1000 mL

## 2018-04-21 MED ORDER — BUPIVACAINE LIPOSOME 1.3 % IJ SUSP
20.0000 mL | Freq: Once | INTRAMUSCULAR | Status: DC
  Filled 2018-04-21: qty 20

## 2018-04-21 MED ORDER — PROPRANOLOL HCL 40 MG PO TABS
40.0000 mg | ORAL_TABLET | Freq: Two times a day (BID) | ORAL | Status: DC
  Administered 2018-04-21 – 2018-04-25 (×8): 40 mg via ORAL
  Filled 2018-04-21 (×8): qty 1

## 2018-04-21 MED ORDER — ACETAMINOPHEN 325 MG PO TABS
325.0000 mg | ORAL_TABLET | Freq: Four times a day (QID) | ORAL | Status: DC | PRN
  Administered 2018-04-22 – 2018-04-23 (×2): 650 mg via ORAL
  Filled 2018-04-21 (×2): qty 2

## 2018-04-21 MED ORDER — BUPIVACAINE-EPINEPHRINE (PF) 0.25% -1:200000 IJ SOLN
INTRAMUSCULAR | Status: AC
  Filled 2018-04-21: qty 30

## 2018-04-21 MED ORDER — EPHEDRINE 5 MG/ML INJ
INTRAVENOUS | Status: AC
  Filled 2018-04-21: qty 10

## 2018-04-21 MED ORDER — INSULIN ASPART 100 UNIT/ML ~~LOC~~ SOLN
0.0000 [IU] | Freq: Three times a day (TID) | SUBCUTANEOUS | Status: DC
  Administered 2018-04-21: 2 [IU] via SUBCUTANEOUS

## 2018-04-21 MED ORDER — BUPIVACAINE IN DEXTROSE 0.75-8.25 % IT SOLN
INTRATHECAL | Status: DC | PRN
  Administered 2018-04-21: 1.8 mL via INTRATHECAL

## 2018-04-21 MED ORDER — GLUCOSE 40 % PO GEL
ORAL | Status: AC
  Administered 2018-04-21: 37.5 g
  Filled 2018-04-21: qty 1

## 2018-04-21 MED ORDER — INSULIN ASPART 100 UNIT/ML ~~LOC~~ SOLN: 0.0000 [IU] | Freq: Every day | SUBCUTANEOUS | Status: DC

## 2018-04-21 MED ORDER — ONDANSETRON HCL 4 MG/2ML IJ SOLN
INTRAMUSCULAR | Status: AC
  Filled 2018-04-21: qty 2

## 2018-04-21 MED ORDER — SODIUM CHLORIDE 0.9 % IV SOLN: INTRAVENOUS | Status: DC

## 2018-04-21 MED ORDER — IRBESARTAN 150 MG PO TABS
150.0000 mg | ORAL_TABLET | Freq: Every day | ORAL | Status: DC
  Administered 2018-04-21 – 2018-04-25 (×5): 150 mg via ORAL
  Filled 2018-04-21 (×5): qty 1

## 2018-04-21 MED ORDER — ALLOPURINOL 100 MG PO TABS
100.0000 mg | ORAL_TABLET | Freq: Every day | ORAL | Status: DC
  Administered 2018-04-22 – 2018-04-25 (×4): 100 mg via ORAL
  Filled 2018-04-21 (×4): qty 1

## 2018-04-21 MED ORDER — ASPIRIN EC 81 MG PO TBEC: 81.0000 mg | DELAYED_RELEASE_TABLET | Freq: Two times a day (BID) | ORAL | 0 refills | Status: AC

## 2018-04-21 MED ORDER — TRANEXAMIC ACID-NACL 1000-0.7 MG/100ML-% IV SOLN
1000.0000 mg | INTRAVENOUS | Status: AC
  Administered 2018-04-21: 1000 mg via INTRAVENOUS
  Filled 2018-04-21: qty 100

## 2018-04-21 MED ORDER — FENTANYL CITRATE (PF) 100 MCG/2ML IJ SOLN
INTRAMUSCULAR | Status: DC | PRN
  Administered 2018-04-21: 50 ug via INTRAVENOUS

## 2018-04-21 MED ORDER — ONDANSETRON HCL 4 MG PO TABS: 4.0000 mg | ORAL_TABLET | Freq: Four times a day (QID) | ORAL | Status: DC | PRN

## 2018-04-21 MED ORDER — BUPIVACAINE HCL (PF) 0.25 % IJ SOLN
INTRAMUSCULAR | Status: DC | PRN
  Administered 2018-04-21: 30 mL

## 2018-04-21 MED ORDER — AMLODIPINE BESYLATE 5 MG PO TABS
5.0000 mg | ORAL_TABLET | Freq: Every day | ORAL | Status: DC
  Administered 2018-04-22 – 2018-04-25 (×4): 5 mg via ORAL
  Filled 2018-04-21 (×4): qty 1

## 2018-04-21 MED ORDER — ONDANSETRON HCL 4 MG/2ML IJ SOLN: 4.0000 mg | Freq: Once | INTRAMUSCULAR | Status: DC | PRN

## 2018-04-21 MED ORDER — CEFAZOLIN SODIUM-DEXTROSE 2-4 GM/100ML-% IV SOLN
2.0000 g | INTRAVENOUS | Status: AC
  Administered 2018-04-21: 2 g via INTRAVENOUS
  Filled 2018-04-21: qty 100

## 2018-04-21 MED ORDER — DOCUSATE SODIUM 100 MG PO CAPS
100.0000 mg | ORAL_CAPSULE | Freq: Two times a day (BID) | ORAL | Status: DC
  Administered 2018-04-21 – 2018-04-22 (×2): 100 mg via ORAL
  Filled 2018-04-21 (×2): qty 1

## 2018-04-21 MED ORDER — SIMVASTATIN 20 MG PO TABS
40.0000 mg | ORAL_TABLET | Freq: Every evening | ORAL | Status: DC
  Administered 2018-04-21 – 2018-04-24 (×4): 40 mg via ORAL
  Filled 2018-04-21: qty 2
  Filled 2018-04-21: qty 1
  Filled 2018-04-21: qty 2
  Filled 2018-04-21: qty 1

## 2018-04-21 MED ORDER — TRANEXAMIC ACID-NACL 1000-0.7 MG/100ML-% IV SOLN
1000.0000 mg | Freq: Once | INTRAVENOUS | Status: AC
  Administered 2018-04-21: 1000 mg via INTRAVENOUS
  Filled 2018-04-21: qty 100

## 2018-04-21 MED ORDER — MIDAZOLAM HCL 5 MG/5ML IJ SOLN
INTRAMUSCULAR | Status: DC | PRN
  Administered 2018-04-21: 1 mg via INTRAVENOUS

## 2018-04-21 MED ORDER — ACETAMINOPHEN 500 MG PO TABS
1000.0000 mg | ORAL_TABLET | Freq: Four times a day (QID) | ORAL | Status: AC
  Administered 2018-04-21 (×2): 1000 mg via ORAL
  Filled 2018-04-21 (×3): qty 2

## 2018-04-21 MED ORDER — METOCLOPRAMIDE HCL 5 MG PO TABS: 5.0000 mg | ORAL_TABLET | Freq: Three times a day (TID) | ORAL | Status: DC | PRN

## 2018-04-21 MED ORDER — PROPOFOL 500 MG/50ML IV EMUL
INTRAVENOUS | Status: DC | PRN
  Administered 2018-04-21: 50 ug/kg/min via INTRAVENOUS

## 2018-04-21 MED ORDER — PHENYLEPHRINE HCL 10 MG/ML IJ SOLN
INTRAVENOUS | Status: DC | PRN
  Administered 2018-04-21: 25 ug/min via INTRAVENOUS

## 2018-04-21 MED ORDER — PHENYLEPHRINE HCL 10 MG/ML IJ SOLN
INTRAMUSCULAR | Status: AC
  Filled 2018-04-21: qty 1

## 2018-04-21 MED ORDER — GABAPENTIN 300 MG PO CAPS: 300.0000 mg | ORAL_CAPSULE | Freq: Every day | ORAL | 0 refills | Status: DC

## 2018-04-21 MED ORDER — BISACODYL 10 MG RE SUPP: 10.0000 mg | Freq: Every day | RECTAL | Status: DC | PRN

## 2018-04-21 MED ORDER — MIDAZOLAM HCL 2 MG/2ML IJ SOLN
INTRAMUSCULAR | Status: AC
  Filled 2018-04-21: qty 2

## 2018-04-21 MED ORDER — CEFAZOLIN SODIUM-DEXTROSE 2-4 GM/100ML-% IV SOLN: 2.0000 g | INTRAVENOUS | Status: DC

## 2018-04-21 MED ORDER — ACETAMINOPHEN 10 MG/ML IV SOLN: 1000.0000 mg | Freq: Once | INTRAVENOUS | Status: DC | PRN

## 2018-04-21 MED ORDER — ROPIVACAINE HCL 5 MG/ML IJ SOLN
INTRAMUSCULAR | Status: DC | PRN
  Administered 2018-04-21: 30 mL via PERINEURAL

## 2018-04-21 MED ORDER — POLYETHYLENE GLYCOL 3350 17 G PO PACK: 17.0000 g | PACK | Freq: Every day | ORAL | Status: DC | PRN

## 2018-04-21 MED ORDER — LIP MEDEX EX OINT
TOPICAL_OINTMENT | CUTANEOUS | Status: AC
  Administered 2018-04-21: 12:00:00
  Filled 2018-04-21: qty 7

## 2018-04-21 MED ORDER — ONDANSETRON HCL 4 MG/2ML IJ SOLN
4.0000 mg | Freq: Four times a day (QID) | INTRAMUSCULAR | Status: DC | PRN
  Administered 2018-04-21: 4 mg via INTRAVENOUS
  Filled 2018-04-21: qty 2

## 2018-04-21 MED ORDER — FLEET ENEMA 7-19 GM/118ML RE ENEM: 1.0000 | ENEMA | Freq: Once | RECTAL | Status: DC | PRN

## 2018-04-21 MED ORDER — SODIUM CHLORIDE (PF) 0.9 % IJ SOLN
INTRAMUSCULAR | Status: AC
  Filled 2018-04-21: qty 50

## 2018-04-21 MED ORDER — TRANEXAMIC ACID 1000 MG/10ML IV SOLN
2000.0000 mg | INTRAVENOUS | Status: DC
  Filled 2018-04-21: qty 20

## 2018-04-21 MED ORDER — PHENOL 1.4 % MT LIQD: 1.0000 | OROMUCOSAL | Status: DC | PRN

## 2018-04-21 MED ORDER — OXYCODONE HCL 5 MG PO TABS: ORAL_TABLET | ORAL | 0 refills | Status: DC

## 2018-04-21 MED ORDER — CEFAZOLIN SODIUM-DEXTROSE 1-4 GM/50ML-% IV SOLN
1.0000 g | Freq: Four times a day (QID) | INTRAVENOUS | Status: AC
  Administered 2018-04-21 (×2): 1 g via INTRAVENOUS
  Filled 2018-04-21 (×3): qty 50

## 2018-04-21 MED ORDER — FENTANYL CITRATE (PF) 100 MCG/2ML IJ SOLN: 25.0000 ug | INTRAMUSCULAR | Status: DC | PRN

## 2018-04-21 MED ORDER — OXYCODONE HCL 5 MG PO TABS
5.0000 mg | ORAL_TABLET | ORAL | Status: DC | PRN
  Administered 2018-04-21 (×2): 10 mg via ORAL
  Filled 2018-04-21 (×2): qty 2

## 2018-04-21 MED ORDER — BUPIVACAINE HCL (PF) 0.25 % IJ SOLN
INTRAMUSCULAR | Status: AC
  Filled 2018-04-21: qty 30

## 2018-04-21 MED ORDER — INSULIN ASPART 100 UNIT/ML ~~LOC~~ SOLN: 4.0000 [IU] | Freq: Three times a day (TID) | SUBCUTANEOUS | Status: DC

## 2018-04-21 SURGICAL SUPPLY — 76 items
BAG DECANTER FOR FLEXI CONT (MISCELLANEOUS) ×3 IMPLANT
BAG SPEC THK2 15X12 ZIP CLS (MISCELLANEOUS) ×1
BAG ZIPLOCK 12X15 (MISCELLANEOUS) ×3 IMPLANT
BLADE 10 SAFETY STRL DISP (BLADE) ×3 IMPLANT
BLADE SAGITTAL 25.0X1.19X90 (BLADE) ×2 IMPLANT
BLADE SAGITTAL 25.0X1.19X90MM (BLADE) ×1
BLADE SAW SAG 90X13X1.27 (BLADE) ×3 IMPLANT
BLADE SURG 15 STRL LF DISP TIS (BLADE) ×1 IMPLANT
BLADE SURG 15 STRL SS (BLADE) ×3
BNDG CMPR MED 10X6 ELC LF (GAUZE/BANDAGES/DRESSINGS) ×1
BNDG CMPR MED 15X6 ELC VLCR LF (GAUZE/BANDAGES/DRESSINGS) ×1
BNDG ELASTIC 6X10 VLCR STRL LF (GAUZE/BANDAGES/DRESSINGS) ×3 IMPLANT
BNDG ELASTIC 6X15 VLCR STRL LF (GAUZE/BANDAGES/DRESSINGS) ×3 IMPLANT
BONE CEMENT GENTAMICIN (Cement) ×6 IMPLANT
BOWL SMART MIX CTS (DISPOSABLE) ×3 IMPLANT
CEMENT BONE GENTAMICIN 40 (Cement) IMPLANT
CEMENT HV SMART SET (Cement) IMPLANT
CEMENT TIBIA MBT SIZE 2.5 (Knees) IMPLANT
COVER SURGICAL LIGHT HANDLE (MISCELLANEOUS) ×3 IMPLANT
COVER WAND RF STERILE (DRAPES) ×3 IMPLANT
CUFF TOURN SGL QUICK 34 (TOURNIQUET CUFF) ×3
CUFF TRNQT CYL 34X4X40X1 (TOURNIQUET CUFF) ×1 IMPLANT
DECANTER SPIKE VIAL GLASS SM (MISCELLANEOUS) ×6 IMPLANT
DRAPE ORTHO SPLIT 77X108 STRL (DRAPES) ×6
DRAPE ORTHO SPLIT 87X125 STRL (DRAPES) ×6 IMPLANT
DRAPE SURG ORHT 6 SPLT 77X108 (DRAPES) IMPLANT
DRAPE U-SHAPE 47X51 STRL (DRAPES) ×3 IMPLANT
DRSG ADAPTIC 3X8 NADH LF (GAUZE/BANDAGES/DRESSINGS) ×3 IMPLANT
DRSG PAD ABDOMINAL 8X10 ST (GAUZE/BANDAGES/DRESSINGS) ×6 IMPLANT
DURAPREP 26ML APPLICATOR (WOUND CARE) ×6 IMPLANT
ELECT REM PT RETURN 15FT ADLT (MISCELLANEOUS) ×3 IMPLANT
FEMUR RIGHT SZ 3 (Knees) ×2 IMPLANT
GAUZE SPONGE 4X4 12PLY STRL (GAUZE/BANDAGES/DRESSINGS) ×3 IMPLANT
GLOVE BIO SURGEON STRL SZ 6.5 (GLOVE) ×2 IMPLANT
GLOVE BIO SURGEON STRL SZ7.5 (GLOVE) ×2 IMPLANT
GLOVE BIO SURGEONS STRL SZ 6.5 (GLOVE) ×1
GLOVE BIOGEL PI IND STRL 7.0 (GLOVE) IMPLANT
GLOVE BIOGEL PI IND STRL 7.5 (GLOVE) IMPLANT
GLOVE BIOGEL PI IND STRL 8 (GLOVE) ×3 IMPLANT
GLOVE BIOGEL PI INDICATOR 7.0 (GLOVE) ×4
GLOVE BIOGEL PI INDICATOR 7.5 (GLOVE) ×4
GLOVE BIOGEL PI INDICATOR 8 (GLOVE) ×6
GLOVE SURG ORTHO 8.0 STRL STRW (GLOVE) ×3 IMPLANT
GLOVE SURG SS PI 7.5 STRL IVOR (GLOVE) ×3 IMPLANT
GOWN STRL REUS W/TWL LRG LVL3 (GOWN DISPOSABLE) ×2 IMPLANT
GOWN STRL REUS W/TWL XL LVL3 (GOWN DISPOSABLE) ×8 IMPLANT
HANDPIECE INTERPULSE COAX TIP (DISPOSABLE) ×3
HOLDER FOLEY CATH W/STRAP (MISCELLANEOUS) ×3 IMPLANT
HOOD PEEL AWAY FLYTE STAYCOOL (MISCELLANEOUS) ×3 IMPLANT
IMMOBILIZER KNEE 20 (SOFTGOODS) ×3
IMMOBILIZER KNEE 20 THIGH 36 (SOFTGOODS) ×1 IMPLANT
INSERT TIBIAL PFC SIG SZ3 10MM (Knees) ×2 IMPLANT
MANIFOLD NEPTUNE II (INSTRUMENTS) ×3 IMPLANT
NDL SAFETY ECLIPSE 18X1.5 (NEEDLE) IMPLANT
NEEDLE HYPO 18GX1.5 SHARP (NEEDLE) ×3
NEEDLE HYPO 22GX1.5 SAFETY (NEEDLE) ×3 IMPLANT
PACK ICE MAXI GEL EZY WRAP (MISCELLANEOUS) ×3 IMPLANT
PACK TOTAL KNEE CUSTOM (KITS) ×3 IMPLANT
PAD ABD 8X10 STRL (GAUZE/BANDAGES/DRESSINGS) ×4 IMPLANT
PADDING CAST COTTON 6X4 STRL (CAST SUPPLIES) ×6 IMPLANT
PATELLA DOME PFC 35MM (Knees) ×2 IMPLANT
PIN STEINMAN FIXATION KNEE (PIN) ×2 IMPLANT
PROTECTOR NERVE ULNAR (MISCELLANEOUS) ×3 IMPLANT
SET HNDPC FAN SPRY TIP SCT (DISPOSABLE) ×1 IMPLANT
SPONGE LAP 18X18 RF (DISPOSABLE) IMPLANT
STAPLER VISISTAT 35W (STAPLE) IMPLANT
SUT ETHIBOND NAB CT1 #1 30IN (SUTURE) ×9 IMPLANT
SUT VIC AB 0 CT1 36 (SUTURE) ×3 IMPLANT
SUT VIC AB 2-0 CT1 27 (SUTURE) ×6
SUT VIC AB 2-0 CT1 TAPERPNT 27 (SUTURE) ×2 IMPLANT
SYR 30ML LL (SYRINGE) ×2 IMPLANT
SYR CONTROL 10ML LL (SYRINGE) ×6 IMPLANT
TIBIA MBT CEMENT SIZE 2.5 (Knees) ×3 IMPLANT
TRAY FOLEY MTR SLVR 14FR STAT (SET/KITS/TRAYS/PACK) ×2 IMPLANT
WRAP KNEE MAXI GEL POST OP (GAUZE/BANDAGES/DRESSINGS) ×2 IMPLANT
YANKAUER SUCT BULB TIP 10FT TU (MISCELLANEOUS) ×3 IMPLANT

## 2018-04-21 NOTE — Op Note (Signed)
NAMDoreen Beam: Bermea, Alphonsine Y. MEDICAL RECORD [id number] ACCOUNT [id number] DATE OF BIRTH:11/26/1940 FACILITY: [facility] LOCATION: [facility] PHYSICIAN:W. Jared Whorley JR., MD  OPERATIVE REPORT  DATE OF PROCEDURE:  04/21/2018  PREOPERATIVE DIAGNOSIS:  Severe osteoarthritis, right knee with valgus deformity.  POSTOPERATIVE DIAGNOSIS:  Severe osteoarthritis, right knee with valgus deformity.  OPERATION:  Right total knee replacement (Sigma size 3 femur 10 mm size 3 bearing 2.5 mm tibia and 35 mm all poly patella).  SURGEON:  Frederico Hammananiel Myldred Raju, MD  ASSISTANJanalyn Rouse:  [place], [place].  ANESTHESIA:  Spinal anesthetic with local supplementation.  TOURNIQUET TIME:  61 minutes.  DESCRIPTION OF PROCEDURE:  Sterile prep and drape and exsanguination of leg inflation of tourniquet 350.  Straight skin incision with medial parapatellar approach to the knee, made.  I cut an 11 mm 5 degree valgus cut on the femur followed by cutting  about 2-3 mm below the most diseased lateral compartment.  Extension gap measured 10 mm.  Femur was sized to be a size 3, followed by placement all-in-1 cutting block and the appropriate degree of external rotation with cutting the anterior, posterior  chamfer cuts.  A keel hole was cut for the tibia after sizing 2.5.  PCL was released as well as excision of posterior osteophytes.  We infiltrated the knee with a mixture of Exparel and Marcaine.  We elected to use antibiotic impregnated cement due to  history of diabetes.  Patella was cut leaving about 14-15 mm of native patella.  We placed a 35 mm all poly trial.  All trials were placed.  I felt resolution of the valgus deformity was noted with full extension, good balance of ligaments.  No tendency  for bearing spinout.  Cement was prepared on the back table and then we cemented the final components in tibia followed by femur and patella.  Cement was allowed to harden with the trial bearing.  Small bits of cement were removed after the bearing  was  removed, tourniquet was released under direct vision, no excessive bleeding was noted.  Small bleeders were coagulated.  Closure was affected with #1 Ethibond, 2-0 Vicryl and skin clips.  A lightly compressive sterile dressing and knee immobilizer  applied, taken to recovery room in stable condition.  TN/NUANCE  D:04/21/2018 T:04/21/2018 JOB:[id number]

## 2018-04-21 NOTE — Anesthesia Procedure Notes (Signed)
Anesthesia Regional Block: Adductor canal block   Pre-Anesthetic Checklist: ,, timeout performed, Correct Patient, Correct Site, Correct Laterality, Correct Procedure, Correct Position, site marked, Risks and benefits discussed,  Surgical consent,  Pre-op evaluation,  At surgeon's request and post-op pain management  Laterality: Right  Prep: Maximum Sterile Barrier Precautions used, chloraprep       Needles:  Injection technique: Single-shot  Needle Type: Echogenic Needle     Needle Length: 9cm  Needle Gauge: 21     Additional Needles:   Procedures:,,,, ultrasound used (permanent image in chart),,,,  Narrative:  Start time: 04/21/2018 7:06 AM End time: 04/21/2018 7:14 AM Injection made incrementally with aspirations every 5 mL.  Performed by: Personally  Anesthesiologist: Trevor IhaHouser, Tomasz Steeves A, MD  Additional Notes: 1 attempt . Pt tolerated procedure.

## 2018-04-21 NOTE — Anesthesia Procedure Notes (Signed)
Procedure Name: MAC Date/Time: 04/21/2018 7:36 AM Performed by: Maxwell Caul, CRNA Pre-anesthesia Checklist: Patient identified, Emergency Drugs available, Suction available and Patient being monitored Oxygen Delivery Method: Simple face mask

## 2018-04-21 NOTE — Anesthesia Postprocedure Evaluation (Signed)
Anesthesia Post Note  Patient: [name]  Procedure(s) Performed: TOTAL KNEE ARTHROPLASTY (Right Knee)     Patient location during evaluation: PACU Anesthesia Type: Spinal Level of consciousness: oriented and awake and alert Pain management: pain level controlled Vital Signs Assessment: post-procedure vital signs reviewed and stable Respiratory status: spontaneous breathing, respiratory function stable and patient connected to nasal cannula oxygen Cardiovascular status: blood pressure returned to baseline and stable Postop Assessment: no headache, no backache and no apparent nausea or vomiting Anesthetic complications: no    Last Vitals:  Vitals:   04/21/18 1115 04/21/18 1122  BP: (!) 146/97 (!) 153/96  Pulse: 60 (!) 58  Resp: 14 16  Temp:  36.9 C  SpO2: 100% 100%    Last Pain:  Vitals:   04/21/18 1122  TempSrc:   PainSc: 2                  Trevor IhaStephen A Analese Sovine

## 2018-04-21 NOTE — Progress Notes (Signed)
Hypoglycemic Event  CBG: 55  Treatment: Administered PO Glutose Gel   Symptoms: None  Follow-up CBG: Time:1328 CBG Result:108  Possible Reasons for Event:NPO prior to surgery w/ no po intake since.    Comments/MD notified: No    Melanie AlbertJennifer A Nelli Black

## 2018-04-21 NOTE — Transfer of Care (Signed)
Immediate Anesthesia Transfer of Care Note  Patient: Melanie BeamBessie Y Black  Procedure(s) Performed: TOTAL KNEE ARTHROPLASTY (Right Knee)  Patient Location: PACU  Anesthesia Type:Spinal  Level of Consciousness: awake, alert  and oriented  Airway & Oxygen Therapy: Patient Spontanous Breathing and Patient connected to face mask oxygen  Post-op Assessment: Report given to RN and Post -op Vital signs reviewed and stable  Post vital signs: Reviewed and stable  Last Vitals:  Vitals Value Taken Time  BP 136/72 04/21/2018  9:57 AM  Temp    Pulse    Resp 14 04/21/2018  9:58 AM  SpO2    Vitals shown include unvalidated device data.  Last Pain:  Vitals:   04/21/18 0620  TempSrc:   PainSc: 3          Complications: No apparent anesthesia complications

## 2018-04-21 NOTE — Interval H&P Note (Signed)
History and Physical Interval Note:  04/21/2018 7:32 AM  [name]  has presented today for surgery, with the diagnosis of OA RIGHT KNEE  The various methods of treatment have been discussed with the patient and family. After consideration of risks, benefits and other options for treatment, the patient has consented to  Procedure(s): TOTAL KNEE ARTHROPLASTY (Right) as a surgical intervention .  The patient's history has been reviewed, patient examined, no change in status, stable for surgery.  I have reviewed the patient's chart and labs.  Questions were answered to the patient's satisfaction.     Thera FlakeW D Sha Amer Jr

## 2018-04-21 NOTE — Anesthesia Procedure Notes (Signed)
Spinal  Patient location during procedure: OB Start time: 04/21/2018 7:42 AM End time: 04/21/2018 7:53 AM Staffing Anesthesiologist: Trevor IhaHouser, Kase Shughart A, MD Performed: anesthesiologist  Preanesthetic Checklist Completed: patient identified, surgical consent, pre-op evaluation, timeout performed, IV checked, risks and benefits discussed and monitors and equipment checked Spinal Block Patient position: sitting Prep: site prepped and draped and DuraPrep Patient monitoring: heart rate, cardiac monitor, continuous pulse ox and blood pressure Approach: midline Location: L2-3 Injection technique: single-shot Needle Needle type: Pencan  Needle gauge: 24 G Needle length: 10 cm Needle insertion depth: 6 cm Assessment Sensory level: T4 Additional Notes 2 attempts at L 3-4 Unsuccessful 1 attempt at  L2-3 + CSF

## 2018-04-21 NOTE — Evaluation (Signed)
Physical Therapy Evaluation Patient Details Name: [name] MRN: [name] DOB: 1941-04-30 Today's Date: 04/21/2018   History of Present Illness  77 yo female s/p R TKR on 04/21/18. PMH includes OA, DMII, obesity, gout, diverticulosis, HTN.   Clinical Impression  Pt presents with severe R knee pain, LE weakness, difficulty performing all mobility tasks and requires mod-max assist this session to participate in PT. Pt to benefit from acute PT to address deficits. Pt with emesis after 3 attempted sit to stands, RN notified and pt returned to supine position with max assist +2.  PT to progress mobility as tolerated, and will continue to follow acutely.      Follow Up Recommendations Follow surgeon's recommendation for DC plan and follow-up therapies;Supervision for mobility/OOB(HHPT)    Equipment Recommendations  None recommended by PT    Recommendations for Other Services       Precautions / Restrictions Precautions Precautions: Fall Required Braces or Orthoses: Knee Immobilizer - Right Knee Immobilizer - Right: On when out of bed or walking;Discontinue once straight leg raise with < 10 degree lag Restrictions Weight Bearing Restrictions: No(Simultaneous filing. User may not have seen previous data.) Other Position/Activity Restrictions: WBAT       Mobility  Bed Mobility Overal bed mobility: Needs Assistance Bed Mobility: Supine to Sit;Sit to Supine     Supine to sit: Mod assist;HOB elevated Sit to supine: Max assist;+2 for physical assistance;+2 for safety/equipment   General bed mobility comments: Pt mod assist for supine to sit for bilateral LE management, trunk elevation, scooting to EOB with use of bed pad. Max assist +2 for return to supine for LE management, trunk lowering, and scooting pt up in bed.   Transfers Overall transfer level: Needs assistance Equipment used: Rolling walker (2 wheeled) Transfers: Sit to/from Stand Sit to Stand: Max assist;From  elevated surface         General transfer comment: Sit to stand attempted 3 times from elevated surface. Max assist for power up, hip extension. Pt unable to come to full standing for any attempts. After 3rd attempt, pt with emesis at EOB. RN notified.   Ambulation/Gait Ambulation/Gait assistance: (NT - pt with N/V, unable to perform sit to stand )              Stairs            Wheelchair Mobility    Modified Rankin (Stroke Patients Only)       Balance Overall balance assessment: Needs assistance Sitting-balance support: Bilateral upper extremity supported;Feet supported Sitting balance-Leahy Scale: Fair                                       Pertinent Vitals/Pain Pain Assessment: 0-10 Pain Score: 8  Pain Location: R knee  Pain Descriptors / Indicators: Sore;Aching Pain Intervention(s): Limited activity within patient's tolerance;Monitored during session;Repositioned;Ice applied    Home Living Family/patient expects to be discharged to:: Private residence Living Arrangements: Spouse/significant other Available Help at Discharge: Family;Available 24 hours/day Type of Home: House Home Access: Stairs to enter Entrance Stairs-Rails: Doctor, general practiceight;Left Entrance Stairs-Number of Steps: 2 Home Layout: One level Home Equipment: Walker - 2 wheels;Cane - single point;Crutches      Prior Function Level of Independence: Independent with assistive device(s)         Comments: Pt reports she used RW prior to admission for mobility      Hand Dominance  Dominant Hand: Right    Extremity/Trunk Assessment   Upper Extremity Assessment Upper Extremity Assessment: Generalized weakness    Lower Extremity Assessment Lower Extremity Assessment: Generalized weakness    Cervical / Trunk Assessment Cervical / Trunk Assessment: Normal  Communication   Communication: No difficulties  Cognition Arousal/Alertness: Lethargic Behavior During Therapy: WFL for  tasks assessed/performed;Flat affect Overall Cognitive Status: Within Functional Limits for tasks assessed                                        General Comments      Exercises Total Joint Exercises Goniometric ROM: Knee AAROM 0-30*, limited by stiffness and pain    Assessment/Plan    PT Assessment Patient needs continued PT services  PT Problem List Decreased strength;Pain;Decreased range of motion;Decreased activity tolerance;Decreased knowledge of use of DME;Decreased balance;Decreased mobility       PT Treatment Interventions DME instruction;Therapeutic activities;Gait training;Therapeutic exercise;Patient/family education;Stair training;Balance training;Functional mobility training    PT Goals (Current goals can be found in the Care Plan section)  Acute Rehab PT Goals Patient Stated Goal: none stated  PT Goal Formulation: With patient Time For Goal Achievement: 04/28/18 Potential to Achieve Goals: Good    Frequency 7X/week   Barriers to discharge        Co-evaluation               AM-PAC PT "6 Clicks" Daily Activity  Outcome Measure Difficulty turning over in bed (including adjusting bedclothes, sheets and blankets)?: Unable Difficulty moving from lying on back to sitting on the side of the bed? : Unable Difficulty sitting down on and standing up from a chair with arms (e.g., wheelchair, bedside commode, etc,.)?: Unable Help needed moving to and from a bed to chair (including a wheelchair)?: A Lot Help needed walking in hospital room?: A Lot Help needed climbing 3-5 steps with a railing? : Total 6 Click Score: 8    End of Session Equipment Utilized During Treatment: Gait belt;Right knee immobilizer Activity Tolerance: Patient limited by pain;Patient limited by fatigue Patient left: in bed;with bed alarm set;with call bell/phone within reach;with SCD's reapplied Nurse Communication: Mobility status;Other (comment)(N/v) PT Visit Diagnosis:  Other abnormalities of gait and mobility (R26.89);Difficulty in walking, not elsewhere classified (R26.2)    Time: 1830-1906 PT Time Calculation (min) (ACUTE ONLY): 36 min   Charges:   PT Evaluation $PT Eval Low Complexity: 1 Low PT Treatments $Therapeutic Activity: 8-22 mins        Nicola Police, PT Acute Rehabilitation Services Pager [phone]  Office [phone]   Novella Abraha D Despina Hidden 04/21/2018, 7:32 PM

## 2018-04-21 NOTE — Discharge Instructions (Signed)

## 2018-04-22 ENCOUNTER — Encounter (HOSPITAL_COMMUNITY): Payer: Self-pay | Admitting: Internal Medicine

## 2018-04-22 ENCOUNTER — Observation Stay (HOSPITAL_COMMUNITY): Payer: Medicare HMO

## 2018-04-22 DIAGNOSIS — E119 Type 2 diabetes mellitus without complications: Secondary | ICD-10-CM

## 2018-04-22 DIAGNOSIS — R0689 Other abnormalities of breathing: Secondary | ICD-10-CM | POA: Diagnosis not present

## 2018-04-22 DIAGNOSIS — G9340 Encephalopathy, unspecified: Secondary | ICD-10-CM | POA: Diagnosis present

## 2018-04-22 LAB — BASIC METABOLIC PANEL
Anion gap: 10 (ref 5–15)
Anion gap: 8 (ref 5–15)
BUN: 22 mg/dL (ref 8–23)
BUN: 24 mg/dL — ABNORMAL HIGH (ref 8–23)
CALCIUM: 8.7 mg/dL — AB (ref 8.9–10.3)
CHLORIDE: 100 mmol/L (ref 98–111)
CO2: 28 mmol/L (ref 22–32)
CO2: 27 mmol/L (ref 22–32)
CREATININE: 1.12 mg/dL — AB (ref 0.44–1.00)
CREATININE: 1.14 mg/dL — AB (ref 0.44–1.00)
Calcium: 8.7 mg/dL — ABNORMAL LOW (ref 8.9–10.3)
Chloride: 103 mmol/L (ref 98–111)
GFR calc non Af Amer: 45 mL/min — ABNORMAL LOW (ref >60)
GFR, EST AFRICAN AMERICAN: 52 mL/min — AB (ref >60)
GFR, EST AFRICAN AMERICAN: 53 mL/min — AB (ref >60)
GFR, EST NON AFRICAN AMERICAN: 46 mL/min — AB (ref >60)
Glucose, Bld: 141 mg/dL — ABNORMAL HIGH (ref 70–99)
Glucose, Bld: 131 mg/dL — ABNORMAL HIGH (ref 70–99)
Potassium: 3.6 mmol/L (ref 3.5–5.1)
Potassium: 3.5 mmol/L (ref 3.5–5.1)
SODIUM: 138 mmol/L (ref 135–145)
SODIUM: 138 mmol/L (ref 135–145)

## 2018-04-22 LAB — GLUCOSE, CAPILLARY
Glucose-Capillary: 119 mg/dL — ABNORMAL HIGH (ref 70–99)
Glucose-Capillary: 93 mg/dL (ref 70–99)
Glucose-Capillary: 125 mg/dL — ABNORMAL HIGH (ref 70–99)
Glucose-Capillary: 152 mg/dL — ABNORMAL HIGH (ref 70–99)
Glucose-Capillary: 83 mg/dL (ref 70–99)

## 2018-04-22 LAB — URINALYSIS, ROUTINE W REFLEX MICROSCOPIC
BACTERIA UA: NONE SEEN
BILIRUBIN URINE: NEGATIVE
GLUCOSE, UA: NEGATIVE mg/dL
Ketones, ur: NEGATIVE mg/dL
NITRITE: NEGATIVE
PH: 5 (ref 5.0–8.0)
Protein, ur: NEGATIVE mg/dL
RBC / HPF: >50 RBC/hpf — ABNORMAL HIGH (ref 0–5)
Specific Gravity, Urine: 1.014 (ref 1.005–1.030)

## 2018-04-22 LAB — MAGNESIUM: MAGNESIUM: 1.8 mg/dL (ref 1.7–2.4)

## 2018-04-22 LAB — CBC
HCT: 33.5 % — ABNORMAL LOW (ref 36.0–46.0)
HEMATOCRIT: 33.9 % — AB (ref 36.0–46.0)
Hemoglobin: 10.3 g/dL — ABNORMAL LOW (ref 12.0–15.0)
Hemoglobin: 10.5 g/dL — ABNORMAL LOW (ref 12.0–15.0)
MCH: 29.2 pg (ref 26.0–34.0)
MCH: 30.1 pg (ref 26.0–34.0)
MCHC: 30.7 g/dL (ref 30.0–36.0)
MCHC: 31 g/dL (ref 30.0–36.0)
MCV: 94.9 fL (ref 80.0–100.0)
MCV: 97.1 fL (ref 80.0–100.0)
NRBC: 0 % (ref 0.0–0.2)
PLATELETS: 211 10*3/uL (ref 150–400)
Platelets: 214 10*3/uL (ref 150–400)
RBC: 3.49 MIL/uL — ABNORMAL LOW (ref 3.87–5.11)
RBC: 3.53 MIL/uL — ABNORMAL LOW (ref 3.87–5.11)
RDW: 14.6 % (ref 11.5–15.5)
RDW: 14.6 % (ref 11.5–15.5)
WBC: 11.2 10*3/uL — AB (ref 4.0–10.5)
WBC: 12.7 10*3/uL — AB (ref 4.0–10.5)
nRBC: 0 % (ref 0.0–0.2)

## 2018-04-22 LAB — BLOOD GAS, ARTERIAL
ACID-BASE EXCESS: 2.9 mmol/L — AB (ref 0.0–2.0)
Bicarbonate: 28 mmol/L (ref 20.0–28.0)
Drawn by: 308601
O2 CONTENT: 2 L/min
O2 Saturation: 98.1 %
PATIENT TEMPERATURE: 98.6
PO2 ART: 108 mmHg (ref 83.0–108.0)
pCO2 arterial: 48.1 mmHg — ABNORMAL HIGH (ref 32.0–48.0)
pH, Arterial: 7.383 (ref 7.350–7.450)

## 2018-04-22 MED ORDER — NALOXONE HCL 0.4 MG/ML IJ SOLN
INTRAMUSCULAR | Status: AC
  Administered 2018-04-22: 0.4 mg
  Filled 2018-04-22: qty 1

## 2018-04-22 MED ORDER — NALOXONE NEWBORN-WH INJECTION 0.4 MG/ML: 2.0000 mg | Freq: Once | INTRAMUSCULAR | Status: DC

## 2018-04-22 MED ORDER — NALOXONE HCL 4 MG/10ML IJ SOLN
0.5000 mg/h | INTRAVENOUS | Status: DC
  Filled 2018-04-22: qty 10

## 2018-04-22 MED ORDER — NALOXONE HCL 0.4 MG/ML IJ SOLN
INTRAMUSCULAR | Status: AC
  Filled 2018-04-22: qty 5

## 2018-04-22 MED ORDER — INSULIN ASPART 100 UNIT/ML ~~LOC~~ SOLN
0.0000 [IU] | SUBCUTANEOUS | Status: DC
  Administered 2018-04-22: 1 [IU] via SUBCUTANEOUS
  Administered 2018-04-22: 2 [IU] via SUBCUTANEOUS

## 2018-04-22 MED ORDER — POTASSIUM CHLORIDE CRYS ER 20 MEQ PO TBCR
40.0000 meq | EXTENDED_RELEASE_TABLET | Freq: Once | ORAL | Status: AC
  Administered 2018-04-22: 40 meq via ORAL
  Filled 2018-04-22: qty 2

## 2018-04-22 MED ORDER — HYDRALAZINE HCL 20 MG/ML IJ SOLN
5.0000 mg | INTRAMUSCULAR | Status: DC | PRN
  Administered 2018-04-23 (×3): 5 mg via INTRAVENOUS
  Filled 2018-04-22 (×3): qty 1

## 2018-04-22 MED ORDER — MAGNESIUM SULFATE IN D5W 1-5 GM/100ML-% IV SOLN
1.0000 g | Freq: Once | INTRAVENOUS | Status: AC
  Administered 2018-04-22: 1 g via INTRAVENOUS
  Filled 2018-04-22: qty 100

## 2018-04-22 MED ORDER — NALOXONE HCL 4 MG/10ML IJ SOLN
0.5000 mg/h | INTRAVENOUS | Status: DC
  Administered 2018-04-22: 0.5 mg/h via INTRAVENOUS
  Filled 2018-04-22 (×2): qty 10

## 2018-04-22 MED ORDER — SENNOSIDES-DOCUSATE SODIUM 8.6-50 MG PO TABS
1.0000 | ORAL_TABLET | Freq: Two times a day (BID) | ORAL | Status: DC
  Administered 2018-04-22 – 2018-04-25 (×7): 1 via ORAL
  Filled 2018-04-22 (×7): qty 1

## 2018-04-22 MED ORDER — SODIUM CHLORIDE 0.9 % IV SOLN
INTRAVENOUS | Status: AC
  Administered 2018-04-22: 04:00:00 via INTRAVENOUS

## 2018-04-22 MED ORDER — NALOXONE HCL 2 MG/2ML IJ SOSY
2.0000 mg | PREFILLED_SYRINGE | INTRAMUSCULAR | Status: AC
  Administered 2018-04-22: 2 mg via INTRAVENOUS
  Filled 2018-04-22: qty 2

## 2018-04-22 MED ORDER — KETOROLAC TROMETHAMINE 15 MG/ML IJ SOLN
15.0000 mg | Freq: Four times a day (QID) | INTRAMUSCULAR | Status: DC | PRN
  Administered 2018-04-22 – 2018-04-24 (×4): 15 mg via INTRAVENOUS
  Filled 2018-04-22 (×4): qty 1

## 2018-04-22 MED ORDER — INSULIN ASPART 100 UNIT/ML ~~LOC~~ SOLN
0.0000 [IU] | Freq: Three times a day (TID) | SUBCUTANEOUS | Status: DC
  Administered 2018-04-23 – 2018-04-25 (×4): 1 [IU] via SUBCUTANEOUS

## 2018-04-22 MED ORDER — ORAL CARE MOUTH RINSE
15.0000 mL | Freq: Two times a day (BID) | OROMUCOSAL | Status: DC
  Administered 2018-04-22 (×2): 15 mL via OROMUCOSAL

## 2018-04-22 NOTE — Consult Note (Signed)
Reason for Consult: Decreased mental response. Referring Physician: Dr. French Ana.  [name] is an 77 y.o. female.  HPI: 77 year old female with history of diabetes mellitus type 2, hypertension, hyperlipidemia, obesity, gout had undergone right knee surgery for osteoarthritis.  Patient has become more progressively drowsy last night after 930 and became less responsive at around 1 AM and rapid response was called CBG was 140.  Patient was given a total of 3 doses of Narcan 0.4 mg with minimal response.  ABG showed acceptable limits.  Labs show mildly elevated creatinine from baseline.  On my exam patient was minimally responsive.  And was taken to stepdown unit.    Patient had received Dilaudid 0.5 mg 2 doses after 7 PM and also oxycodone and gabapentin.  Information obtained from the patient's nurse.  After discussing with critical care patient was given 2 mg IV Narcan following which patient became more alert awake transiently following commands moving extremities and on request able to stretch out her tongue.  Narcan drip is been started.  Past Medical History:  Diagnosis Date  . Aortic atherosclerosis ([place]) 09/11/2016   noted on CXR  . Arthritis   . Baker's cyst, left 2014   Small, Left  . Bilateral leg numbness   . Cataract    Bilateral  . Diabetes mellitus without complication ([place])   . Diverticulosis 10/09/2016   Noted on CT abd/pelvis  . Gout   . Grade I diastolic dysfunction 13/24/4010   Noted on ECHO  . 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 History of iron deficiency anemia   . History of migraine   . Hypercholesteremia   . Hypertension   . Internal hemorrhoids   . Lateral meniscal tear 2014   Left  . LVH (left ventricular hypertrophy) 03/03/2018   noted on EKG  . Obesity   . PMB (postmenopausal bleeding)   . Pneumonia 2018   Right upper lower  . PONV (postoperative nausea and vomiting)   . 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 Thoracic aortic aneurysm ([place]) 12/09/2016   ascending 4.0 cm, noted on CT chest, pt unaware    Past Surgical History:  Procedure Laterality Date  . ABDOMINAL HYSTERECTOMY    . CATARACT EXTRACTION Right 09/2010  . CATARACT EXTRACTION Left 10/2010  . COLONOSCOPY    . DILATION AND CURETTAGE OF UTERUS      History reviewed. No pertinent family history.  Social History:  reports that she has never smoked. She has never used smokeless tobacco. She reports that she does not drink alcohol or use drugs.  Allergies: No Known Allergies  Medications: I have reviewed the patient's current medications.  Results for orders placed or performed during the hospital encounter of 04/21/18 (from the past 48 hour(s))  Glucose, capillary     Status: None   Collection Time: 04/21/18  6:08 AM  Result Value Ref Range   Glucose-Capillary 81 70 - 99 mg/dL   Comment 1 Notify RN    Comment 2 Document in Chart   Glucose, capillary     Status: None   Collection Time: 04/21/18 10:06 AM  Result Value Ref Range   Glucose-Capillary 80 70 - 99 mg/dL  Glucose, capillary     Status: Abnormal   Collection Time: 04/21/18 12:32 PM  Result Value Ref Range   Glucose-Capillary 55 (  L) 70 - 99 mg/dL  Glucose, capillary     Status: Abnormal   Collection Time: 04/21/18  1:28 PM  Result Value Ref Range   Glucose-Capillary 108 (H) 70 - 99 mg/dL  Glucose, capillary     Status: Abnormal   Collection Time: 04/21/18  5:42 PM  Result Value Ref Range   Glucose-Capillary 127 (H) 70 - 99 mg/dL  Glucose, capillary     Status: Abnormal   Collection Time: 04/21/18  8:56 PM  Result Value Ref Range   Glucose-Capillary 130 (H) 70 - 99 mg/dL  Glucose, capillary     Status: Abnormal   Collection Time: 04/21/18 11:19 PM  Result Value Ref Range   Glucose-Capillary 145 (H) 70 - 99 mg/dL  Blood gas, arterial     Status: Abnormal   Collection  Time: 04/22/18 12:15 AM  Result Value Ref Range   O2 Content 2.0 L/min   Delivery systems NASAL CANNULA    pH, Arterial 7.383 7.350 - 7.450   pCO2 arterial 48.1 (H) 32.0 - 48.0 mmHg   pO2, Arterial 108 83.0 - 108.0 mmHg   Bicarbonate 28.0 20.0 - 28.0 mmol/L   Acid-Base Excess 2.9 (H) 0.0 - 2.0 mmol/L   O2 Saturation 98.1 %   Patient temperature 98.6    Collection site RIGHT RADIAL    Drawn by [id number]    Sample type ARTERIAL DRAW    Allens test (pass/fail) PASS PASS    Comment: Performed at [facility] [address] [place], [place] [address]  Basic metabolic panel     Status: Abnormal   Collection Time: 04/22/18 12:51 AM  Result Value Ref Range   Sodium 138 135 - 145 mmol/L   Potassium 3.6 3.5 - 5.1 mmol/L   Chloride 100 98 - 111 mmol/L   CO2 28 22 - 32 mmol/L   Glucose, Bld 141 (H) 70 - 99 mg/dL   BUN 22 8 - 23 mg/dL   Creatinine, Ser 1.12 (H) 0.44 - 1.00 mg/dL   Calcium 8.7 (L) 8.9 - 10.3 mg/dL   GFR calc non Af Amer 46 (L) >60 mL/min   GFR calc Af Amer 53 (L) >60 mL/min    Comment: (NOTE) The eGFR has been calculated using the CKD EPI equation. This calculation has not been validated in all clinical situations. eGFR's persistently <60 mL/min signify possible Chronic Kidney Disease.    Anion gap 10 5 - 15    Comment: Performed at [facility], [place] [address], [place], [place] [address]  CBC     Status: Abnormal   Collection Time: 04/22/18 12:51 AM  Result Value Ref Range   WBC 12.7 (H) 4.0 - 10.5 K/uL   RBC 3.49 (L) 3.87 - 5.11 MIL/uL   Hemoglobin 10.5 (L) 12.0 - 15.0 g/dL   HCT 33.9 (L) 36.0 - 46.0 %   MCV 97.1 80.0 - 100.0 fL   MCH 30.1 26.0 - 34.0 pg   MCHC 31.0 30.0 - 36.0 g/dL   RDW 14.6 11.5 - 15.5 %   Platelets 211 150 - 400 K/uL   nRBC 0.0 0.0 - 0.2 %    Comment: Performed at [facility], [place] [address], [place], [place] [address]  Magnesium     Status: None   Collection Time: 04/22/18  1:58 AM   Result Value Ref Range   Magnesium 1.8 1.7 - 2.4 mg/dL    Comment: Performed at [facility], [place]  [address] [place], [place] [address]  Glucose, capillary     Status: Abnormal   Collection Time: 04/22/18  3:16 AM  Result Value Ref Range   Glucose-Capillary 119 (H) 70 - 99 mg/dL   Comment 1 Notify RN    Comment 2 Document in Chart     Dg Chest Port 1 View  Result Date: 04/22/2018 CLINICAL DATA:  Bradypnea EXAM: PORTABLE CHEST 1 VIEW COMPARISON:  03/03/2018 FINDINGS: The heart size and mediastinal contours are within normal limits. Both lungs are clear. The visualized skeletal structures are unremarkable. IMPRESSION: No active disease. Electronically Signed   By: Kathreen Devoid   On: 04/22/2018 02:02    Review of Systems  Unable to perform ROS: Mental status change   Blood pressure 127/66, pulse 68, temperature 97.8 F (36.6 C), temperature source Oral, resp. rate 15, height _0  (1.575 m), weight 86 kg, SpO2 100 %. Physical Exam  Constitutional: She appears well-developed and well-nourished.  HENT:  Head: Normocephalic and atraumatic.  Eyes:  Pupils reacting appears small.  Neck: Neck supple. No tracheal deviation present. No thyromegaly present.  Cardiovascular: Normal rate and regular rhythm.  Respiratory:  Decreased rate.  GI: Soft. Bowel sounds are normal. She exhibits no distension. There is no tenderness.  Musculoskeletal:  Patient encephalopathic so not possible.  Neurological:  Patient drowsy became alert after 2 mg Narcan. When became alert moved her upper extremity and stuck her tongue out on request.  Skin: Skin is warm.    Assessment/Plan: #1.  Acute encephalopathy likely from pain medication and as explained in history of presenting illness patient has been started on Narcan drip and closely follow respiratory status and mental status and neurochecks.  Will avoid any pain relief medications which has been already discontinued for now.  Follow  metabolic panel and gentle hydration for now. #2.  Diabetes mellitus type 2 we will keep patient on sliding scale coverage with every 4 CBG checks. #3.  Hypertension we will keep patient on PRN IV hydralazine since patient is going to be n.p.o. for now until patient is more alert awake. #4.  History of gout. #5.  Right knee surgery yesterday for osteoarthritis.  Thank you for involving [place] in patient's care we will follow along with you. Case discussed with pulmonary critical care.  Rise Patience 04/22/2018, 3:20 AM

## 2018-04-22 NOTE — Progress Notes (Signed)
Patient seen and examined, she is more alert, husband at bedside Slow wean narcan drip

## 2018-04-22 NOTE — Progress Notes (Signed)
Physical Therapy Treatment Patient Details Name: [name] MRN: [id number] DOB: November 13, 1940 Today's Date: 04/22/2018    History of Present Illness 77 yo female s/p R TKR on 04/21/18. pt became progressinvely more  lethargic night of surgery, rapid response was called- 3 doses of Narcan were given without improvement and pt was transferred to SDU; PMH includes OA, DMII, obesity, gout, diverticulosis, HTN.     PT Comments    Pt more alert this pm, pain better controlled; progressign slowly--able to transfer to chair with +2 assist--after discussion regarding pain and that knee surgery, rehab afterward is not a painless process, pt verbalizes understanding; continue PT POC  Follow Up Recommendations  Follow surgeon's recommendation for DC plan and follow-up therapies;Supervision for mobility/OOB     Equipment Recommendations  None recommended by PT    Recommendations for Other Services       Precautions / Restrictions Precautions Precautions: Fall;Knee Required Braces or Orthoses: Knee Immobilizer - Right Knee Immobilizer - Right: On when out of bed or walking Restrictions Weight Bearing Restrictions: No Other Position/Activity Restrictions: WBAT     Mobility  Bed Mobility Overal bed mobility: Needs Assistance Bed Mobility: Supine to Sit     Supine to sit: Mod assist;Max assist     General bed mobility comments: assist to elevate trunk and bring RLE off bed  Transfers Overall transfer level: Needs assistance Equipment used: Rolling walker (2 wheeled) Transfers: Sit to/from Stand Sit to Stand: +2 physical assistance;+2 safety/equipment;Mod assist         General transfer comment: verbal cues for hand placement and knee flexion LLE, incr time needed  Ambulation/Gait                 Stairs             Wheelchair Mobility    Modified Rankin (Stroke Patients Only)       Balance   Sitting-balance support: Bilateral upper extremity  supported;Feet supported;Feet unsupported Sitting balance-Leahy Scale: Fair Sitting balance - Comments: fair to poor, repeated posterior LOB                                    Cognition Arousal/Alertness: Lethargic Behavior During Therapy: WFL for tasks assessed/performed;Flat affect Overall Cognitive Status: Impaired/Different from baseline Area of Impairment: Attention;Following commands;Safety/judgement;Problem solving                   Current Attention Level: Sustained   Following Commands: Follows one step commands with increased time Safety/Judgement: Decreased awareness of safety;Decreased awareness of deficits   Problem Solving: Slow processing;Decreased initiation;Difficulty sequencing;Requires verbal cues;Requires tactile cues        Exercises Total Joint Exercises Ankle Circles/Pumps: AROM;10 reps;Both Quad Sets: AROM;Both;10 reps Heel Slides: AAROM;Right;10 reps Hip ABduction/ADduction: AAROM;Right;10 reps Straight Leg Raises: AROM;AAROM;Right;10 reps Goniometric ROM: very limited knee flexion d/t muscle guarding    General Comments        Pertinent Vitals/Pain Pain Assessment: Faces Faces Pain Scale: Hurts little more Pain Location: R knee  Pain Descriptors / Indicators: Grimacing;Guarding;Crying;Moaning Pain Intervention(s): Limited activity within patient's tolerance;Monitored during session;Premedicated before session;Repositioned    Home Living                      Prior Function            PT Goals (current goals can now be found in the care plan section)  Acute Rehab PT Goals Patient Stated Goal: none stated  PT Goal Formulation: With patient Time For Goal Achievement: 04/28/18 Potential to Achieve Goals: Good Progress towards PT goals: Progressing toward goals    Frequency    7X/week      PT Plan Current plan remains appropriate    Co-evaluation              AM-PAC PT "6 Clicks" Daily Activity   Outcome Measure  Difficulty turning over in bed (including adjusting bedclothes, sheets and blankets)?: Unable Difficulty moving from lying on back to sitting on the side of the bed? : Unable Difficulty sitting down on and standing up from a chair with arms (e.g., wheelchair, bedside commode, etc,.)?: Unable Help needed moving to and from a bed to chair (including a wheelchair)?: A Lot Help needed walking in hospital room?: Total Help needed climbing 3-5 steps with a railing? : Total 6 Click Score: 7    End of Session Equipment Utilized During Treatment: Gait belt;Right knee immobilizer Activity Tolerance: Patient tolerated treatment well Patient left: in chair;with call bell/phone within reach;with family/visitor present   PT Visit Diagnosis: Other abnormalities of gait and mobility (R26.89);Difficulty in walking, not elsewhere classified (R26.2)     Time: [id number] PT Time Calculation (min) (ACUTE ONLY): 30 min  Charges:  $Therapeutic Exercise: 8-22 mins $Therapeutic Activity: 8-22 mins                     Drucilla Chaletara Turhan Chill, PT  Pager: [phone] Acute Rehab Dept [facility]): [phone]   04/22/2018    [facility] CenterWILLIAMS,[name] 04/22/2018, 4:34 PM

## 2018-04-22 NOTE — Significant Event (Signed)
Rapid Response Event Note  Overview: Time Called: 0000 Arrival Time: 0005 Event Type: Other (Comment)  Initial Focused Assessment:  Called d/t  Pt is very hard to arouse.  Pt responds to sternal rub by opening her eyes but right away drift off back to sleep.  Pt able to say her name but unable to stay awake for any amount of time, VSS (97.8 temp, HR 67, sats 96 on 2LNC, RR 7, B/P 120/68), see flow sheet , also.   Interventions: Pt place on monitor AMS protocol followed, MD made aware.  After minimal response from interventions received.  Pt given additional dose of Narcan per orders.  Still not any significant response noted, however RR increased above 10.  TRIAD, MD at bedside (consulted by pt on call PA).  Pt will transfer to SD for closer monitoring.    Plan of Care (if not transferred): Pt will transfer to 1236 see orders, will initiate and implement.     Conley RollsGarland, Toney Difatta Lavern

## 2018-04-22 NOTE — Progress Notes (Signed)
Orthopaedic Trauma Service (OTS)  1 Day Post-Op Procedure(s) (LRB): TOTAL KNEE ARTHROPLASTY (Right)  Subjective: Reviewed in detail events of yesterday and last night. Appreciate RRT and my medicine colleagues for their evaluations and interventions. Appears to be limited to opioid sensitivity. Adjustments have been made to minimize dosage and exposure.  Patient reports pain as moderate.  Did not get to work well with PT because of pain/N/V when upright on side of bed and then subsequent events.  Objective: Current Vitals Blood pressure (!) 165/80, pulse 68, temperature 97.9 F (36.6 C), temperature source Oral, resp. rate 12, height 5\' 2"  (1.575 m), weight 86 kg, SpO2 98 %. Vital signs in last 24 hours: Temp:  [97.5 F (36.4 C)-98.7 F (37.1 C)] 97.9 F (36.6 C) (11/16 0816) Pulse Rate:  [55-71] 68 (11/16 0700) Resp:  [7-16] 12 (11/16 0700) BP: (120-172)/(66-101) 165/80 (11/16 0700) SpO2:  [96 %-100 %] 98 % (11/16 0700)  Intake/Output from previous day: 11/15 0701 - 11/16 0700 In: 3162.6 [P.O.:180; I.V.:2632.6; IV Piggyback:350] Out: 2500 [Urine:2475; Blood:25]  LABS Recent Labs    04/22/18 0051 04/22/18 0318  HGB 10.5* 10.3*   Recent Labs    04/22/18 0051 04/22/18 0318  WBC 12.7* 11.2*  RBC 3.49* 3.53*  HCT 33.9* 33.5*  PLT 211 214   Recent Labs    04/22/18 0051 04/22/18 0318  NA 138 138  K 3.6 3.5  CL 100 103  CO2 28 27  BUN 22 24*  CREATININE 1.12* 1.14*  GLUCOSE 141* 131*  CALCIUM 8.7* 8.7*   No results for input(s): LABPT, INR in the last 72 hours.   Physical Exam RLE  Dressing intact, clean, dry  Edema/ swelling controlled  Sens: DPN, SPN, TN intact  Motor: EHL, FHL, and lessor toe ext and flex all intact grossly  DP 2+, Brisk cap refill, warm to touch  Assessment/Plan: 1 Day Post-Op Procedure(s) (LRB): TOTAL KNEE ARTHROPLASTY (Right) 1. PT/OT  2. DVT proph ECASA 81mg  bid 3. Xfer from step down anticipated today; Will likely need at  least two more days in the hospital 4. Continue glucose control which has been excellent. 5. Scheduled tylenol, ice, possible low dose toradol or half a Norco  Myrene GalasMichael Merisa Julio, MD [facility] [phone] (p)

## 2018-04-22 NOTE — Progress Notes (Addendum)
Patient is very hard to wake up, responsed to sternal rub by opening her eyes but right away drift off to sleep; pt able to state her name but unable to stay awake, VS were stable, O2 sat 98% at 2L [place], charge nurse at bedside, rapid response were called d/t altered mental status.

## 2018-04-22 NOTE — Progress Notes (Signed)
Physical Therapy Treatment Patient Details Name: [name] MRN: [id number] DOB: 03/16/1941 Today's Date: 04/22/2018    History of Present Illness 77 yo female s/p R TKR on 04/21/18. pt became progressinvely more  lethargic night of surgery, rapid response was called- 3 doses of Narcan were given without improvement and pt was transferred to SDU; PMH includes OA, DMII, obesity, gout, diverticulosis, HTN.     PT Comments     Pt  limited by pain,  Will see again in pm;   Follow Up Recommendations  Follow surgeon's recommendation for DC plan and follow-up therapies;Supervision for mobility/OOB     Equipment Recommendations  None recommended by PT    Recommendations for Other Services       Precautions / Restrictions Precautions Precautions: Fall Required Braces or Orthoses: Knee Immobilizer - Right Knee Immobilizer - Right: On when out of bed or walking Restrictions Weight Bearing Restrictions: No Other Position/Activity Restrictions: WBAT     Mobility  Bed Mobility Overal bed mobility: Needs Assistance Bed Mobility: Supine to Sit;Sit to Supine     Supine to sit: Max assist Sit to supine: Max assist;+2 for physical assistance;+2 for safety/equipment   General bed mobility comments: assist to elevate trunk and brign LEs off and on to bed; +2 needed to transition to supine  Transfers Overall transfer level: Needs assistance Equipment used: Rolling walker (2 wheeled) Transfers: Sit to/from Stand Sit to Stand: +2 physical assistance;Max assist         General transfer comment: +2 to rise to 3/4 stand, limited by pain  Ambulation/Gait             General Gait Details: unable    Stairs             Wheelchair Mobility    Modified Rankin (Stroke Patients Only)       Balance Overall balance assessment: Needs assistance Sitting-balance support: Bilateral upper extremity supported;Feet supported;Feet unsupported Sitting balance-Leahy Scale:  Poor Sitting balance - Comments: pt with repeated posterior LOB, requiring min to mod to max assist to                                     Cognition Arousal/Alertness: Lethargic Behavior During Therapy: [facility] for tasks assessed/performed;Flat affect Overall Cognitive Status: Impaired/Different from baseline Area of Impairment: Attention;Following commands;Safety/judgement;Problem solving                   Current Attention Level: Sustained   Following Commands: Follows one step commands inconsistently Safety/Judgement: Decreased awareness of safety;Decreased awareness of deficits   Problem Solving: Slow processing;Decreased initiation;Difficulty sequencing;Requires verbal cues;Requires tactile cues        Exercises Total Joint Exercises Ankle Circles/Pumps: AROM;10 reps;Both Quad Sets: AROM;Both;10 reps Heel Slides: AAROM;Right;5 reps    General Comments        Pertinent Vitals/Pain Pain Assessment: 0-10 Pain Score: 10-Worst pain ever Pain Location: R knee  Pain Descriptors / Indicators: Grimacing;Guarding;Crying;Moaning Pain Intervention(s): Limited activity within patient's tolerance;Monitored during session;Premedicated before session;Repositioned;Patient requesting pain meds-RN notified(RN checking on meds)    Home Living                      Prior Function            PT Goals (current goals can now be found in the care plan section) Acute Rehab PT Goals Patient Stated Goal: none stated  PT Goal Formulation: With patient Time For Goal Achievement: 04/28/18 Potential to Achieve Goals: Good Progress towards PT goals: Not progressing toward goals - comment(d/t pain)    Frequency    7X/week      PT Plan Current plan remains appropriate    Co-evaluation              AM-PAC PT "6 Clicks" Daily Activity  Outcome Measure  Difficulty turning over in bed (including adjusting bedclothes, sheets and blankets)?:  Unable Difficulty moving from lying on back to sitting on the side of the bed? : Unable Difficulty sitting down on and standing up from a chair with arms (e.g., wheelchair, bedside commode, etc,.)?: Unable Help needed moving to and from a bed to chair (including a wheelchair)?: Total Help needed walking in hospital room?: Total Help needed climbing 3-5 steps with a railing? : Total 6 Click Score: 6    End of Session Equipment Utilized During Treatment: Gait belt Activity Tolerance: Patient tolerated treatment well Patient left: in bed;with call bell/phone within reach;with family/visitor present;with nursing/sitter in room;with bed alarm set   PT Visit Diagnosis: Other abnormalities of gait and mobility (R26.89);Difficulty in walking, not elsewhere classified (R26.2)     Time: 7829-56211204-1238 PT Time Calculation (min) (ACUTE ONLY): 34 min  Charges:  $Therapeutic Activity: 23-37 mins                     Drucilla Chaletara Neo Yepiz, PT  Pager: [phone] Acute Rehab Dept [facility]): [phone]   04/22/2018    [facility],L[place] Constantin 04/22/2018, 1:18 PM

## 2018-04-22 NOTE — Progress Notes (Signed)
This Clinical research associatewriter tried to call pt's spouse and daughter to give updates of patient's status, no answer at this time, voice message left with call back number.

## 2018-04-22 NOTE — Progress Notes (Signed)
eLink Physician-Brief Progress Note Patient Name: Melanie BeamBessie Y Black DOB: 1941-02-04 MRN: [id number]   Date of Service  04/22/2018  HPI/Events of Note  Decreased level of consciousness after receiving dilaudid and po narcotic medications  eICU Interventions  Narcan 2mg  iv - responded well. ABG acceptable. Will start narcan gtt. neurochecks q1h x4        Ephrem Carrick 04/22/2018, 3:04 AM

## 2018-04-23 DIAGNOSIS — T50995A Adverse effect of other drugs, medicaments and biological substances, initial encounter: Secondary | ICD-10-CM | POA: Diagnosis not present

## 2018-04-23 DIAGNOSIS — M1711 Unilateral primary osteoarthritis, right knee: Secondary | ICD-10-CM | POA: Diagnosis present

## 2018-04-23 DIAGNOSIS — E669 Obesity, unspecified: Secondary | ICD-10-CM | POA: Diagnosis present

## 2018-04-23 DIAGNOSIS — Z6834 Body mass index (BMI) 34.0-34.9, adult: Secondary | ICD-10-CM | POA: Diagnosis not present

## 2018-04-23 DIAGNOSIS — E785 Hyperlipidemia, unspecified: Secondary | ICD-10-CM | POA: Diagnosis present

## 2018-04-23 DIAGNOSIS — E7800 Pure hypercholesterolemia, unspecified: Secondary | ICD-10-CM | POA: Diagnosis present

## 2018-04-23 DIAGNOSIS — Z8701 Personal history of pneumonia (recurrent): Secondary | ICD-10-CM | POA: Diagnosis not present

## 2018-04-23 DIAGNOSIS — G92 Toxic encephalopathy: Secondary | ICD-10-CM | POA: Diagnosis not present

## 2018-04-23 DIAGNOSIS — E119 Type 2 diabetes mellitus without complications: Secondary | ICD-10-CM | POA: Diagnosis present

## 2018-04-23 DIAGNOSIS — Z90710 Acquired absence of both cervix and uterus: Secondary | ICD-10-CM | POA: Diagnosis not present

## 2018-04-23 DIAGNOSIS — R5383 Other fatigue: Secondary | ICD-10-CM | POA: Diagnosis not present

## 2018-04-23 DIAGNOSIS — M21061 Valgus deformity, not elsewhere classified, right knee: Secondary | ICD-10-CM | POA: Diagnosis present

## 2018-04-23 DIAGNOSIS — M109 Gout, unspecified: Secondary | ICD-10-CM | POA: Diagnosis present

## 2018-04-23 DIAGNOSIS — I10 Essential (primary) hypertension: Secondary | ICD-10-CM | POA: Diagnosis present

## 2018-04-23 LAB — BASIC METABOLIC PANEL
ANION GAP: 7 (ref 5–15)
BUN: 17 mg/dL (ref 8–23)
CHLORIDE: 105 mmol/L (ref 98–111)
CO2: 29 mmol/L (ref 22–32)
Calcium: 8.4 mg/dL — ABNORMAL LOW (ref 8.9–10.3)
Creatinine, Ser: 0.88 mg/dL (ref 0.44–1.00)
Glucose, Bld: 114 mg/dL — ABNORMAL HIGH (ref 70–99)
POTASSIUM: 3.8 mmol/L (ref 3.5–5.1)
SODIUM: 141 mmol/L (ref 135–145)

## 2018-04-23 LAB — CBC
HCT: 31.5 % — ABNORMAL LOW (ref 36.0–46.0)
HEMOGLOBIN: 9.8 g/dL — AB (ref 12.0–15.0)
MCH: 29.7 pg (ref 26.0–34.0)
MCHC: 31.1 g/dL (ref 30.0–36.0)
MCV: 95.5 fL (ref 80.0–100.0)
Platelets: 171 10*3/uL (ref 150–400)
RBC: 3.3 MIL/uL — ABNORMAL LOW (ref 3.87–5.11)
RDW: 14.6 % (ref 11.5–15.5)
WBC: 10.1 10*3/uL (ref 4.0–10.5)
nRBC: 0 % (ref 0.0–0.2)

## 2018-04-23 LAB — MAGNESIUM: MAGNESIUM: 2.1 mg/dL (ref 1.7–2.4)

## 2018-04-23 LAB — GLUCOSE, CAPILLARY
GLUCOSE-CAPILLARY: 131 mg/dL — AB (ref 70–99)
GLUCOSE-CAPILLARY: 146 mg/dL — AB (ref 70–99)
Glucose-Capillary: 113 mg/dL — ABNORMAL HIGH (ref 70–99)
Glucose-Capillary: 90 mg/dL (ref 70–99)

## 2018-04-23 NOTE — Progress Notes (Signed)
Physical Therapy Treatment Patient Details Name: Melanie BeamBessie Y Black MRN: [id number] DOB: 1941/04/04 Today's Date: 04/23/2018    History of Present Illness 77 yo female s/p R TKR on 04/21/18. pt became progressinvely more  lethargic night of surgery, rapid response was called- 3 doses of Narcan were given without improvement and pt was transferred to SDU; PMH includes OA, DMII, obesity, gout, diverticulosis, HTN.     PT Comments    Pt progressing although slowly; able to amb 15' with RW and min/mod assist +2; will need a few more days of PT if pt is to d/c home; pt is motivated to work with PT; would benefit from SNF, however her dtr is comign to assist when she is d/c'd, husband supportive but  is not supposed to lift at this time per p; continue PT POC  Follow Up Recommendations  Follow surgeon's recommendation for DC plan and follow-up therapies;Supervision for mobility/OOB     Equipment Recommendations  None recommended by PT    Recommendations for Other Services       Precautions / Restrictions Precautions Precautions: Fall;Knee Required Braces or Orthoses: Knee Immobilizer - Right Knee Immobilizer - Right: On when out of bed or walking Restrictions Weight Bearing Restrictions: No Other Position/Activity Restrictions: WBAT     Mobility  Bed Mobility Overal bed mobility: Needs Assistance Bed Mobility: Supine to Sit     Supine to sit: Mod assist;+2 for safety/equipment     General bed mobility comments: assist to elevate trunk and bring RLE off bed, cues to self assist  Transfers Overall transfer level: Needs assistance Equipment used: Rolling walker (2 wheeled) Transfers: Sit to/from Stand Sit to Stand: +2 physical assistance;+2 safety/equipment;Mod assist         General transfer comment: verbal cues for hand placement and knee flexion LLE, incr time needed, +2 assist to rise and transition to RW  Ambulation/Gait Ambulation/Gait assistance: Min assist;+2  safety/equipment;Mod assist Gait Distance (Feet): 15 Feet Assistive device: Rolling walker (2 wheeled) Gait Pattern/deviations: Step-to pattern;Decreased weight shift to right     General Gait Details: multi-modal cues for sequence, trunk extension, and RW position, assist throughout for wt shfit, balance, safety   Stairs             Wheelchair Mobility    Modified Rankin (Stroke Patients Only)       Balance             Standing balance-Leahy Scale: Poor Standing balance comment: reliant on UEs and external assist                            Cognition Arousal/Alertness: Awake/alert Behavior During Therapy: WFL for tasks assessed/performed;Flat affect Overall Cognitive Status: Impaired/Different from baseline Area of Impairment: Attention;Following commands;Safety/judgement;Problem solving                   Current Attention Level: Sustained   Following Commands: Follows one step commands with increased time Safety/Judgement: Decreased awareness of safety;Decreased awareness of deficits   Problem Solving: Slow processing;Decreased initiation;Difficulty sequencing;Requires verbal cues;Requires tactile cues        Exercises Total Joint Exercises Ankle Circles/Pumps: AROM;10 reps;Both Quad Sets: AROM;Both;10 reps Heel Slides: AAROM;Right;10 reps Straight Leg Raises: AROM;AAROM;Right;10 reps    General Comments        Pertinent Vitals/Pain Pain Assessment: Faces Faces Pain Scale: Hurts little more Pain Location: R knee  Pain Descriptors / Indicators: Grimacing;Guarding Pain Intervention(s): Limited activity within patient's  tolerance;Monitored during session;Premedicated before session;Repositioned;Ice applied    Home Living                      Prior Function            PT Goals (current goals can now be found in the care plan section) Acute Rehab PT Goals Patient Stated Goal: none stated  PT Goal Formulation: With  patient Time For Goal Achievement: 04/28/18 Potential to Achieve Goals: Good Progress towards PT goals: Progressing toward goals    Frequency    7X/week      PT Plan Current plan remains appropriate    Co-evaluation              AM-PAC PT "6 Clicks" Daily Activity  Outcome Measure  Difficulty turning over in bed (including adjusting bedclothes, sheets and blankets)?: Unable Difficulty moving from lying on back to sitting on the side of the bed? : Unable Difficulty sitting down on and standing up from a chair with arms (e.g., wheelchair, bedside commode, etc,.)?: Unable Help needed moving to and from a bed to chair (including a wheelchair)?: A Lot Help needed walking in hospital room?: A Lot Help needed climbing 3-5 steps with a railing? : Total 6 Click Score: 8    End of Session Equipment Utilized During Treatment: Gait belt;Right knee immobilizer Activity Tolerance: Patient limited by fatigue;Patient limited by pain Patient left: in chair;with call bell/phone within reach;with family/visitor present   PT Visit Diagnosis: Other abnormalities of gait and mobility (R26.89);Difficulty in walking, not elsewhere classified (R26.2)     Time: 0981-1914 PT Time Calculation (min) (ACUTE ONLY): 33 min  Charges:  $Gait Training: 23-37 mins                     Drucilla Chalet, PT  Pager: [phone] Acute Rehab Dept [facility]): [phone]   04/23/2018    [facility] 04/23/2018, 1:53 PM

## 2018-04-23 NOTE — Progress Notes (Signed)
   04/23/18 1500  PT Visit Information  Last PT Received On 04/23/18 Pt waiting for possible transfer to floor, encouraged pt to amb twice tomorrow; pt and husband in agreement with plan; they are concerned about CPM use; continue PT POC  Assistance Needed +2  History of Present Illness 77 yo female s/p R TKR on 04/21/18. pt became progressinvely more  lethargic night of surgery, rapid response was called- 3 doses of Narcan were given without improvement and pt was transferred to SDU; PMH includes OA, DMII, obesity, gout, diverticulosis, HTN.   Subjective Data  Patient Stated Goal none stated   Precautions  Precautions Fall;Knee  Required Braces or Orthoses Knee Immobilizer - Right  Knee Immobilizer - Right On when out of bed or walking  Restrictions  Weight Bearing Restrictions No  Other Position/Activity Restrictions WBAT   Pain Assessment  Pain Assessment Faces  Faces Pain Scale 2  Pain Location R knee   Pain Descriptors / Indicators Grimacing;Guarding  Pain Intervention(s) Limited activity within patient's tolerance;Monitored during session  Cognition  Arousal/Alertness Awake/alert  Behavior During Therapy WFL for tasks assessed/performed;Flat affect  Overall Cognitive Status Impaired/Different from baseline  Bed Mobility  General bed mobility comments  (received in bed, pt waiting for possible transfer to floor)  Total Joint Exercises  Goniometric ROM ~ 5* to 60* AAROM right knee flexion; incr guarding with knee flexion  Ankle Circles/Pumps AROM;10 reps;Both  Quad Sets AROM;Both;10 reps  Heel Slides AAROM;AROM;Both;15 reps  Hip ABduction/ADduction AROM;AAROM;Right;10 reps  Straight Leg Raises AROM;AAROM;Right;10 reps  Short Arc Massachusetts Mutual LifeQuad AROM;AAROM;Right;10 reps  PT - End of Session  Activity Tolerance Patient tolerated treatment well  Patient left in bed;with call bell/phone within reach;with bed alarm set;with family/visitor present   PT - Assessment/Plan  PT Plan Current  plan remains appropriate  PT Visit Diagnosis Other abnormalities of gait and mobility (R26.89);Difficulty in walking, not elsewhere classified (R26.2)  PT Frequency (ACUTE ONLY) 7X/week  Follow Up Recommendations Follow surgeon's recommendation for DC plan and follow-up therapies;Supervision for mobility/OOB  PT equipment None recommended by PT  AM-PAC PT "6 Clicks" Daily Activity Outcome Measure  Difficulty turning over in bed (including adjusting bedclothes, sheets and blankets)? 1  Difficulty moving from lying on back to sitting on the side of the bed?  1  Difficulty sitting down on and standing up from a chair with arms (e.g., wheelchair, bedside commode, etc,.)? 1  Help needed moving to and from a bed to chair (including a wheelchair)? 2  Help needed walking in hospital room? 2  Help needed climbing 3-5 steps with a railing?  1  6 Click Score 8  Mobility G Code  CM  PT Goal Progression  Progress towards PT goals Progressing toward goals  Acute Rehab PT Goals  PT Goal Formulation With patient  Time For Goal Achievement 04/28/18  Potential to Achieve Goals Good  PT Time Calculation  PT Start Time (ACUTE ONLY) 1458  PT Stop Time (ACUTE ONLY) 1511  PT Time Calculation (min) (ACUTE ONLY) 13 min  PT General Charges  $$ ACUTE PT VISIT 1 Visit  PT Treatments  $Therapeutic Exercise 8-22 mins

## 2018-04-23 NOTE — Progress Notes (Signed)
Pt stable though bp elevated on arrival to floor from icu. Pt has tolerable knee pain. No s/s of distress or pain.

## 2018-04-23 NOTE — Progress Notes (Signed)
Orthopaedic Trauma Service (OTS)  2 Days Post-Op Procedure(s) (LRB): TOTAL KNEE ARTHROPLASTY (Right)  Subjective: Patient reports pain as mild.  Medicine working without sedation. Ambulated to hallway today.  Objective: Current Vitals Blood pressure (!) 160/75, pulse 74, temperature 98.4 F (36.9 C), temperature source Oral, resp. rate 19, height 5\' 2"  (1.575 m), weight 86 kg, SpO2 100 %. Vital signs in last 24 hours: Temp:  [98.4 F (36.9 C)-99.8 F (37.7 C)] 98.4 F (36.9 C) (11/17 1200) Pulse Rate:  [66-80] 74 (11/17 1500) Resp:  [14-25] 19 (11/17 1500) BP: (129-192)/(52-95) 160/75 (11/17 1500) SpO2:  [91 %-100 %] 100 % (11/17 1500)  Intake/Output from previous day: 11/16 0701 - 11/17 0700 In: 3334 [P.O.:120; I.V.:3114.2; IV Piggyback:99.8] Out: 2550 [Urine:2550]  LABS Recent Labs    04/22/18 0051 04/22/18 0318 04/23/18 0340  HGB 10.5* 10.3* 9.8*   Recent Labs    04/22/18 0318 04/23/18 0340  WBC 11.2* 10.1  RBC 3.53* 3.30*  HCT 33.5* 31.5*  PLT 214 171   Recent Labs    04/22/18 0318 04/23/18 0340  NA 138 141  K 3.5 3.8  CL 103 105  CO2 27 29  BUN 24* 17  CREATININE 1.14* 0.88  GLUCOSE 131* 114*  CALCIUM 8.7* 8.4*   No results for input(s): LABPT, INR in the last 72 hours.   Physical Exam RLE  Dressing intact, clean, dry--changed--and wound pristine  Edema/ swelling controlled  Sens: DPN, SPN, TN intact  Motor: EHL, FHL, and lessor toe ext and flex all intact grossly  2+ DP, Brisk cap refill, warm to touch  Assessment/Plan: 2 Days Post-Op Procedure(s) (LRB): TOTAL KNEE ARTHROPLASTY (Right) 1. PT/OT, CPM tonight 2. DVT proph ECASA 3. Reg floor now; d/c tomorrow after two sessions of PT most likely  Myrene GalasMichael Morrill Bomkamp, MD [facility] [phone] (p)

## 2018-04-23 NOTE — Progress Notes (Signed)
Patient is fully alert off narcan drip, labs stable, medicine will sign off, plan of care per ortho.

## 2018-04-23 NOTE — Care Management Note (Signed)
Case Management Note  Patient Details  Name: Melanie BeamBessie Y Black MRN: [id number] Date of Birth: 1940/10/17  Subjective/Objective:   Right TKA                 Action/Plan: [facility] to pt [facility] ([facility] from surgeons' office). Pt states she has CPM, RW and bedside commode at home. Husband will be at home to assist with care.   Expected Discharge Date:                Expected Discharge Plan:  Home w Home Health Services  In-House Referral:  NA  Discharge planning Services  CM Consult  Post Acute Care Choice:  Home Health Choice offered to:  Patient  DME Arranged:  3-N-1, Walker rolling, CPM DME Agency:  Other - Comment  HH Arranged:  PT HH Agency:  [facility]  Status of Service:  Completed, signed off  If discussed at Long Length of Stay Meetings, dates discussed:    Additional Comments:  Elliot CousinShavis, Khalilah Hoke Ellen, RN 04/23/2018, 1:04 PM

## 2018-04-24 ENCOUNTER — Encounter (HOSPITAL_COMMUNITY): Payer: Self-pay | Admitting: Orthopedic Surgery

## 2018-04-24 LAB — CBC
HCT: 32.5 % — ABNORMAL LOW (ref 36.0–46.0)
HEMOGLOBIN: 10.5 g/dL — AB (ref 12.0–15.0)
MCH: 29.8 pg (ref 26.0–34.0)
MCHC: 32.3 g/dL (ref 30.0–36.0)
MCV: 92.3 fL (ref 80.0–100.0)
PLATELETS: 178 10*3/uL (ref 150–400)
RBC: 3.52 MIL/uL — ABNORMAL LOW (ref 3.87–5.11)
RDW: 14.8 % (ref 11.5–15.5)
WBC: 9.9 10*3/uL (ref 4.0–10.5)
nRBC: 0 % (ref 0.0–0.2)

## 2018-04-24 LAB — GLUCOSE, CAPILLARY
GLUCOSE-CAPILLARY: 135 mg/dL — AB (ref 70–99)
Glucose-Capillary: 106 mg/dL — ABNORMAL HIGH (ref 70–99)
Glucose-Capillary: 129 mg/dL — ABNORMAL HIGH (ref 70–99)
Glucose-Capillary: 96 mg/dL (ref 70–99)

## 2018-04-24 MED ORDER — TRAMADOL HCL 50 MG PO TABS: 100.0000 mg | ORAL_TABLET | Freq: Four times a day (QID) | ORAL | 0 refills | Status: DC | PRN

## 2018-04-24 MED ORDER — TRAMADOL HCL 50 MG PO TABS
100.0000 mg | ORAL_TABLET | Freq: Four times a day (QID) | ORAL | Status: DC
  Administered 2018-04-24 – 2018-04-25 (×3): 100 mg via ORAL
  Filled 2018-04-24 (×3): qty 2

## 2018-04-24 MED ORDER — TRAMADOL HCL 50 MG PO TABS: ORAL_TABLET | ORAL | 0 refills | Status: DC

## 2018-04-24 NOTE — Progress Notes (Signed)
Physical Therapy Treatment Patient Details Name: Melanie BeamBessie Y Steinert MRN: [id number] DOB: January 09, 1941 Today's Date: 04/24/2018    History of Present Illness 77 yo female s/p R TKR on 04/21/18. pt became progressinvely more  lethargic night of surgery, rapid response was called- 3 doses of Narcan were given without improvement and pt was transferred to SDU; PMH includes OA, DMII, obesity, gout, diverticulosis, HTN.     PT Comments    Pt is progressing slowly; she exhibits multiple Parkinson like symptoms--decr initiation, bradykinesia, flat affect, pill rolling/resting tremors UEs, some tremors noted in head/jaw as well;   Pt is improving but continues to struggle with transitional movements-bed mobility, transfers, etc---incr tolerance to gait today; pt would benefit from one more day in acute setting to allow incr independence/safety prior to return home with family assist;  will continue to follow, continue PT POC   Follow Up Recommendations  Follow surgeon's recommendation for DC plan and follow-up therapies;Supervision for mobility/OOB     Equipment Recommendations  None recommended by PT    Recommendations for Other Services       Precautions / Restrictions Precautions Precautions: Fall;Knee Precaution Comments: removed KI for sit to stand transfers; pt requiring only light assist to perform 15 SLRs Required Braces or Orthoses: Knee Immobilizer - Right Knee Immobilizer - Right: On when out of bed or walking Restrictions Weight Bearing Restrictions: No Other Position/Activity Restrictions: WBAT     Mobility  Bed Mobility Overal bed mobility: Needs Assistance Bed Mobility: Supine to Sit     Supine to sit: Mod assist     General bed mobility comments: assist to elevate trunk, repetitious cues for sequence, technique, incr time, delayed initiation  Transfers Overall transfer level: Needs assistance Equipment used: Rolling walker (2 wheeled) Transfers: Sit to/from  Stand Sit to Stand: Min assist;Mod assist;From elevated surface         General transfer comment: verbal cues for hand placement and knee flexion LLE, incr time needed, assist to rise and transition to RW--improved pt effort when R KI removed to allow incr right knee flexion  Ambulation/Gait Ambulation/Gait assistance: Min guard   Assistive device: Rolling walker (2 wheeled) Gait Pattern/deviations: Step-to pattern;Decreased weight shift to right     General Gait Details: multi-modal cues for sequence, trunk extension, and RW position, min/guard for  balance, safety   Stairs             Wheelchair Mobility    Modified Rankin (Stroke Patients Only)       Balance             Standing balance-Leahy Scale: Poor Standing balance comment: reliant on UEs                             Cognition Arousal/Alertness: Awake/alert Behavior During Therapy: [facility] for tasks assessed/performed;Flat affect   Area of Impairment: Attention;Following commands;Safety/judgement;Problem solving                   Current Attention Level: Sustained   Following Commands: Follows one step commands with increased time;Follows multi-step commands inconsistently Safety/Judgement: Decreased awareness of safety;Decreased awareness of deficits   Problem Solving: Slow processing;Decreased initiation;Difficulty sequencing;Requires verbal cues;Requires tactile cues        Exercises Total Joint Exercises Ankle Circles/Pumps: AROM;10 reps;Both Quad Sets: AROM;Both;10 reps Straight Leg Raises: AAROM;Strengthening;Right;15 reps    General Comments        Pertinent Vitals/Pain Pain Location: R knee  Pain Descriptors / Indicators: Grimacing;Guarding;Sore    Home Living                      Prior Function            PT Goals (current goals can now be found in the care plan section) Acute Rehab PT Goals Patient Stated Goal: none stated  PT Goal Formulation:  With patient Time For Goal Achievement: 04/28/18 Potential to Achieve Goals: Good Progress towards PT goals: Progressing toward goals    Frequency    7X/week      PT Plan Current plan remains appropriate    Co-evaluation              AM-PAC PT "6 Clicks" Daily Activity  Outcome Measure  Difficulty turning over in bed (including adjusting bedclothes, sheets and blankets)?: Unable Difficulty moving from lying on back to sitting on the side of the bed? : Unable Difficulty sitting down on and standing up from a chair with arms (e.g., wheelchair, bedside commode, etc,.)?: Unable Help needed moving to and from a bed to chair (including a wheelchair)?: A Little Help needed walking in hospital room?: A Little Help needed climbing 3-5 steps with a railing? : A Little 6 Click Score: 12    End of Session Equipment Utilized During Treatment: Gait belt Activity Tolerance: Patient tolerated treatment well Patient left: in chair;with call bell/phone within reach;with family/visitor present Nurse Communication: Mobility status PT Visit Diagnosis: Other abnormalities of gait and mobility (R26.89);Difficulty in walking, not elsewhere classified (R26.2)     Time: 1914-7829 PT Time Calculation (min) (ACUTE ONLY): 45 min  Charges:  $Gait Training: 23-37 mins $Therapeutic Activity: 8-22 mins                     Drucilla Chalet, PT  Pager: [phone] Acute Rehab Dept [facility]): [phone]   04/24/2018    [facility] 04/24/2018, 12:36 PM

## 2018-04-24 NOTE — Progress Notes (Signed)
PT TREATMENT NOTE  04/24/18 1500  PT Visit Information  Last PT Received On 04/24/18  Pt progressing--needs to continue to work on transitional movements--bed mobility, sit to stand and stairs (2-3 steps)  Assistance Needed +1  History of Present Illness [age] yo female s/p R TKR on 04/21/18. pt became progressinvely more  lethargic night of surgery, rapid response was called- 3 doses of Narcan were given without improvement and pt was transferred to SDU; PMH includes OA, DMII, obesity, gout, diverticulosis, HTN.   Subjective Data  Patient Stated Goal none stated   Precautions  Precautions Fall;Knee  Precaution Comments removed KI for sit to stand transfers; pt requiring only light assist to perform 15 SLRs  Required Braces or Orthoses Knee Immobilizer - Right  Knee Immobilizer - Right On when out of bed or walking  Restrictions  Weight Bearing Restrictions No  Other Position/Activity Restrictions WBAT   Pain Assessment  Pain Assessment 0-10  Pain Score 3  Pain Location R knee   Pain Descriptors / Indicators Grimacing;Guarding;Sore  Pain Intervention(s) Limited activity within patient's tolerance;Monitored during session;Premedicated before session;Repositioned  Cognition  Arousal/Alertness Awake/alert  Behavior [facility] for tasks assessed/performed;Flat affect  Area of Impairment Attention;Following commands;Safety/judgement;Problem solving  Current Attention Level Sustained  Following Commands Follows one step commands with increased time;Follows multi-step commands inconsistently  Safety/Judgement Decreased awareness of safety;Decreased awareness of deficits  Problem Solving Slow processing;Decreased initiation;Difficulty sequencing;Requires verbal cues;Requires tactile cues  Bed Mobility  Overal bed mobility Needs Assistance  Bed Mobility Sit to Supine  Sit to supine Min assist  General bed mobility comments cues for sequence and technique  Transfers  Overall transfer  level Needs assistance  Equipment used Rolling walker (2 wheeled)  Transfers Sit to/from Stand  Sit to Stand Min assist;From elevated surface  General transfer comment verbal cues for hand placment, control of descent and RLE position  Ambulation/Gait  Ambulation/Gait assistance Min guard  Gait Distance (Feet) 15 Feet (coming from bathroom)  Assistive device Rolling walker (2 wheeled)  Gait Pattern/deviations Step-to pattern;Decreased weight shift to right  General Gait Details multi-modal cues for sequence, trunk extension, and RW position, min/guard for  balance, safety  Balance  Standing balance-Leahy Scale Poor  Standing balance comment reliant on UEs   Total Joint Exercises  Ankle Circles/Pumps AROM;10 reps;Both  Heel Slides AAROM;AROM;Both;15 reps  Hip ABduction/ADduction AROM;AAROM;Right;10 reps  Straight Leg Raises AAROM;Strengthening;Right;AROM;10 reps  PT - End of Session  Equipment Utilized During Treatment Gait belt  Activity Tolerance Patient tolerated treatment well  Patient left with call bell/phone within reach;with family/visitor present;in bed;with bed alarm set  Nurse Communication Mobility status   PT - Assessment/Plan  PT Plan Current plan remains appropriate  PT Visit Diagnosis Other abnormalities of gait and mobility (R26.89);Difficulty in walking, not elsewhere classified (R26.2)  PT Frequency (ACUTE ONLY) 7X/week  Follow Up Recommendations Follow surgeon's recommendation for DC plan and follow-up therapies;Supervision for mobility/OOB  PT equipment None recommended by PT  AM-PAC PT "6 Clicks" Daily Activity Outcome Measure  Difficulty turning over in bed (including adjusting bedclothes, sheets and blankets)? 1  Difficulty moving from lying on back to sitting on the side of the bed?  1  Difficulty sitting down on and standing up from a chair with arms (e.g., wheelchair, bedside commode, etc,.)? 1  Help needed moving to and from a bed to chair (including a  wheelchair)? 3  Help needed walking in hospital room? 3  Help needed climbing 3-5 steps with a  railing?  3  6 Click Score 12  Mobility G Code  CL  PT Goal Progression  Progress towards PT goals Progressing toward goals  Acute Rehab PT Goals  PT Goal Formulation With patient  Time For Goal Achievement 04/28/18  Potential to Achieve Goals Good  PT Time Calculation  PT Start Time (ACUTE ONLY) 1445  PT Stop Time (ACUTE ONLY) 1509  PT Time Calculation (min) (ACUTE ONLY) 24 min  PT General Charges  $$ ACUTE PT VISIT 1 Visit  PT Treatments  $Gait Training 8-22 mins  $Therapeutic Exercise 8-22 mins

## 2018-04-24 NOTE — Progress Notes (Signed)
OT Cancellation Note  Patient Details Name: [name] MRN: [id number] DOB: January 03, 1941   Cancelled Treatment:    Reason Eval/Treat Not Completed: Other (comment) Met with pt lying supine in bed, had just received meal and requesting to finish prior to participating in therapy. OT will check back another time as pt available and appropriate to initiate OT POC.   Zenovia Jarred, MSOT, OTR/L Behavioral Health OT/ Acute Relief OT [facility] Office: [phone]  Zenovia Jarred 04/24/2018, 5:24 PM

## 2018-04-24 NOTE — Discharge Summary (Signed)
PATIENT ID: [name]        MRN:  [id number]          DOB/AGE: 77-Feb-1942 / 77 y.o.    DISCHARGE SUMMARY  ADMISSION DATE:    04/21/2018 DISCHARGE DATE:   04/25/2018  ADMISSION DIAGNOSIS: OA RIGHT KNEE    DISCHARGE DIAGNOSIS:  OA RIGHT KNEE    ADDITIONAL DIAGNOSIS: Principal Problem:   Acute encephalopathy Active Problems:   OBESITY   Essential hypertension   Diabetes mellitus without complication (HCC)   Primary localized osteoarthritis of right knee   Bradypnea   Right knee DJD  Past Medical History:  Diagnosis Date  . Aortic atherosclerosis (HCC) 09/11/2016   noted on CXR  . Arthritis   . Baker's cyst, left 2014   Small, Left  . Bilateral leg numbness   . Cataract    Bilateral  . Diabetes mellitus without complication (HCC)   . Diverticulosis 10/09/2016   Noted on CT abd/pelvis  . Gout   . Grade I diastolic dysfunction 09/12/2016   Noted on ECHO  . 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 History of iron deficiency anemia   . History of migraine   . Hypercholesteremia   . Hypertension   . Internal hemorrhoids   . Lateral meniscal tear 2014   Left  . LVH (left ventricular hypertrophy) 03/03/2018   noted on EKG  . Obesity   . PMB (postmenopausal bleeding)   . Pneumonia 2018   Right upper lower  . PONV (postoperative nausea and vomiting)   . 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 Thoracic aortic aneurysm (HCC) 12/09/2016   ascending 4.0 cm, noted on CT chest, pt unaware    PROCEDURE: Procedure(s): TOTAL KNEE ARTHROPLASTY Right on 04/21/2018  CONSULTS: PT/OT/Med    HISTORY:  See H&P in chart  HOSPITAL COURSE:  Melanie SECREST is a 77 y.o. admitted on 04/21/2018 and found to have a diagnosis of OA RIGHT KNEE.  After appropriate laboratory studies were obtained  they were taken to the operating room on 04/21/2018 and underwent   Procedure(s): TOTAL KNEE ARTHROPLASTY  Right.   They were given perioperative antibiotics:  Anti-infectives (From admission, onward)   Start     Dose/Rate Route Frequency Ordered Stop   04/21/18 1400  ceFAZolin (ANCEF) IVPB 1 g/50 mL premix     1 g 100 mL/hr over 30 Minutes Intravenous Every 6 hours 04/21/18 1137 04/21/18 2021   04/21/18 0600  ceFAZolin (ANCEF) IVPB 2g/100 mL premix  Status:  Discontinued     2 g 200 mL/hr over 30 Minutes Intravenous On call to O.R. 04/21/18 0556 04/21/18 1531   04/21/18 0600  ceFAZolin (ANCEF) IVPB 2g/100 mL premix     2 g 200 mL/hr over 30 Minutes Intravenous On call to O.R. 04/21/18 0559 04/21/18 0801    .  Tolerated the procedure well.  Placed with a foley intraoperatively.  Patient became very lethargic RRT was called given 4 doses narcan with slight response, Medicine team consulted and was moved to SD for narcan drip.   POD #1, narcan drip continued patient showed continued improvement, pain med discontinued.  POD #2, transferred back to ortho floor, continued PT and ambulation, slow to progress due to setback with lethargy.  Somewhat limited by pain.  POD#3, PT continued slow to progress but  making headway recommended 1 additional night in med-surg for additional PT prior to d/c home with hhpt.  POD#4, tolerated pain well with Tramadol, continued work with PT and d/c home with hhpt  The remainder of the hospital course was dedicated to ambulation and strengthening.   The patient was discharged on 4 days post op in  Stable condition.  Blood products given:none  DIAGNOSTIC STUDIES: Recent vital signs:  Patient Vitals for the past 24 hrs:  BP Temp Temp src Pulse Resp SpO2  04/25/18 0608 135/89 97.6 F (36.4 C) Oral 66 16 97 %  04/24/18 2246 (!) 144/95 98.6 F (37 C) Oral 64 17 92 %  04/24/18 2100 - - - 74 18 -  04/24/18 1346 126/60 98.3 F (36.8 C) - 73 20 97 %  04/24/18 1032 (!) 152/77 99 F (37.2 C) Oral 74 20 96 %       Recent  laboratory studies: Recent Labs    04/22/18 0051 04/22/18 0318 04/23/18 0340 04/24/18 0435  WBC 12.7* 11.2* 10.1 9.9  HGB 10.5* 10.3* 9.8* 10.5*  HCT 33.9* 33.5* 31.5* 32.5*  PLT 211 214 171 178   Recent Labs    04/22/18 0051 04/22/18 0318 04/23/18 0340  NA 138 138 141  K 3.6 3.5 3.8  CL 100 103 105  CO2 28 27 29   BUN 22 24* 17  CREATININE 1.12* 1.14* 0.88  GLUCOSE 141* 131* 114*  CALCIUM 8.7* 8.7* 8.4*   Lab Results  Component Value Date   INR 0.86 03/03/2018   INR 1.07 10/06/2016     Recent Radiographic Studies :  Dg Chest Port 1 View  Result Date: 04/22/2018 CLINICAL DATA:  Bradypnea EXAM: PORTABLE CHEST 1 VIEW COMPARISON:  03/03/2018 FINDINGS: The heart size and mediastinal contours are within normal limits. Both lungs are clear. The visualized skeletal structures are unremarkable. IMPRESSION: No active disease. Electronically Signed   By: Elige Ko   On: 04/22/2018 02:02    DISCHARGE INSTRUCTIONS:   DISCHARGE MEDICATIONS:   Allergies as of 04/25/2018   No Known Allergies     Medication List    STOP taking these medications   guaiFENesin-dextromethorphan 100-10 MG/5ML syrup Commonly known as:  ROBITUSSIN DM   ibuprofen 600 MG tablet Commonly known as:  ADVIL,MOTRIN   naproxen 500 MG tablet Commonly known as:  NAPROSYN   pantoprazole 40 MG tablet Commonly known as:  PROTONIX     TAKE these medications   acetaminophen 500 MG tablet Commonly known as:  TYLENOL Take 500 mg by mouth every 6 (six) hours as needed for moderate pain.   allopurinol 100 MG tablet Commonly known as:  ZYLOPRIM Take 100 mg by mouth daily.   amLODipine 5 MG tablet Commonly known as:  NORVASC Take 5 mg by mouth daily.   aspirin EC 81 MG tablet Take 1 tablet (81 mg total) by mouth 2 (two) times daily.   chlorthalidone 25 MG tablet Commonly known as:  HYGROTON Take 12.5 mg by mouth daily.   metFORMIN 1000 MG tablet Commonly known as:  GLUCOPHAGE Take 1,000 mg  by mouth daily with breakfast.   olmesartan 20 MG tablet Commonly known as:  BENICAR Take 20 mg by mouth daily.   propranolol 40 MG tablet Commonly known as:  INDERAL Take 40 mg by mouth 2 (two) times daily.   simvastatin 40 MG tablet Commonly known as:  ZOCOR Take 40 mg by mouth every evening.   traMADol 50 MG tablet Commonly known as:  ULTRAM Take 1-2 tabs po q6hrs prn pain, start with lowest effective dose.            Durable Medical Equipment  (From admission, onward)         Start     Ordered   04/21/18 1138  DME Walker rolling  Once    Question:  Patient needs a walker to treat with the following condition  Answer:  Primary localized osteoarthritis of right knee   04/21/18 1137   04/21/18 1138  DME 3 n 1  Once     04/21/18 1137          FOLLOW UP VISIT:   Follow-up Information    Frederico Hammanaffrey, Daniel, MD. Schedule an appointment as soon as possible for a visit in 2 weeks.   Specialty:  Orthopedic Surgery Contact information: [address] [place] [address] [phone]        [facility] Follow up.   Why:  Home Health Physical Therapy-agency will call to arrange initial visit Contact information: [address] [place] [id number] [phone]          DISPOSITION:   Home  CONDITION:  Stable   Margart SicklesJoshua Geremiah Fussell, PA-C  04/25/2018 8:33 AM

## 2018-04-24 NOTE — Plan of Care (Signed)
  Problem: Health Behavior/Discharge Planning: Goal: Ability to manage health-related needs will improve Outcome: Progressing   Problem: Clinical Measurements: Goal: Ability to maintain clinical measurements within normal limits will improve Outcome: Progressing Goal: Will remain free from infection Outcome: Progressing Goal: Diagnostic test results will improve Outcome: Progressing Goal: Respiratory complications will improve Outcome: Progressing Goal: Cardiovascular complication will be avoided Outcome: Progressing   Problem: Activity: Goal: Risk for activity intolerance will decrease Outcome: Progressing   Problem: Nutrition: Goal: Adequate nutrition will be maintained Outcome: Progressing   Problem: Elimination: Goal: Will not experience complications related to bowel motility Outcome: Progressing Goal: Will not experience complications related to urinary retention Outcome: Progressing   Problem: Pain Managment: Goal: General experience of comfort will improve Outcome: Progressing   Problem: Safety: Goal: Ability to remain free from injury will improve Outcome: Progressing   Problem: Education: Goal: Knowledge of the prescribed therapeutic regimen will improve Outcome: Progressing   Problem: Activity: Goal: Ability to avoid complications of mobility impairment will improve Outcome: Progressing Goal: Range of joint motion will improve Outcome: Progressing   Problem: Clinical Measurements: Goal: Postoperative complications will be avoided or minimized Outcome: Progressing   Problem: Pain Management: Goal: Pain level will decrease with appropriate interventions Outcome: Progressing   Problem: Skin Integrity: Goal: Will show signs of wound healing Outcome: Progressing

## 2018-04-24 NOTE — Progress Notes (Signed)
Subjective: 3 Days Post-Op Procedure(s) (LRB): TOTAL KNEE ARTHROPLASTY (Right) Patient reports pain as mild and moderate.    Objective: Vital signs in last 24 hours: Temp:  [98.7 F (37.1 C)-99.2 F (37.3 C)] 99 F (37.2 C) (11/18 1032) Pulse Rate:  [69-84] 74 (11/18 1032) Resp:  [16-22] 20 (11/18 1032) BP: (139-185)/(61-85) 152/77 (11/18 1032) SpO2:  [95 %-100 %] 96 % (11/18 1032)  Intake/Output from previous day: 11/17 0701 - 11/18 0700 In: 1319.1 [P.O.:480; I.V.:839.1] Out: 2850 [Urine:2850] Intake/Output this shift: Total I/O In: 286.2 [P.O.:240; I.V.:46.2] Out: 100 [Urine:100]  Recent Labs    04/22/18 0051 04/22/18 0318 04/23/18 0340 04/24/18 0435  HGB 10.5* 10.3* 9.8* 10.5*   Recent Labs    04/23/18 0340 04/24/18 0435  WBC 10.1 9.9  RBC 3.30* 3.52*  HCT 31.5* 32.5*  PLT 171 178   Recent Labs    04/22/18 0318 04/23/18 0340  NA 138 141  K 3.5 3.8  CL 103 105  CO2 27 29  BUN 24* 17  CREATININE 1.14* 0.88  GLUCOSE 131* 114*  CALCIUM 8.7* 8.4*   No results for input(s): LABPT, INR in the last 72 hours.  Neurovascular intact Sensation intact distally Intact pulses distally Dorsiflexion/Plantar flexion intact Incision: dressing C/D/I No cellulitis present Compartment soft  Anticipated LOS equal to or greater than 2 midnights due to - Age 77 and older with one or more of the following:  - Obesity  - Expected need for hospital services (PT, OT, Nursing) required for safe  discharge  - Anticipated need for postoperative skilled nursing care or inpatient rehab  - Active co-morbidities: Diabetes OR   - Unanticipated findings during/Post Surgery: Slow post-op progression: GI, pain control, mobility  - Patient is a high risk of re-admission due to: None   Assessment/Plan: 3 Days Post-Op Procedure(s) (LRB): TOTAL KNEE ARTHROPLASTY (Right) Up with therapy Plan for discharge tomorrow Discharge home with home health  Taking precaution with pain  med due to events over the weekend, she has had Tramadol prescribed through our office and tolerated well will enter for breakthrough pain Asa dvt proph     Margart SicklesJoshua Gweneth Fredlund 04/24/2018, 1:20 PM

## 2018-04-25 LAB — GLUCOSE, CAPILLARY
Glucose-Capillary: 92 mg/dL (ref 70–99)
Glucose-Capillary: 142 mg/dL — ABNORMAL HIGH (ref 70–99)

## 2018-04-25 NOTE — Plan of Care (Signed)
Plan of care reviewed and discussed with the patient.  Denies questions at this time.

## 2018-04-25 NOTE — Progress Notes (Signed)
Physical Therapy Treatment Patient Details Name: [name] MRN: [id number] DOB: Jul 11, 1940 Today's Date: 04/25/2018    History of Present Illness 77 yo female s/p R TKR on 04/21/18. pt became progressinvely more  lethargic night of surgery, rapid response was called- 3 doses of Narcan were given without improvement and pt was transferred to SDU; PMH includes OA, DMII, obesity, gout, diverticulosis, HTN.     PT Comments    Patient progressing and able to negotiate stairs with family assist safely for home entry.  Will continue to need assist for mobility at home and family able to provide.  Follow up PT recommended.  Planned d/c today so no further skilled PT deferred to follow up setting as determined per MD.   Follow Up Recommendations  Follow surgeon's recommendation for DC plan and follow-up therapies;Supervision for mobility/OOB     Equipment Recommendations  None recommended by PT    Recommendations for Other Services       Precautions / Restrictions Precautions Precautions: Fall;Knee Precaution Comments: KI not used Restrictions Other Position/Activity Restrictions: WBAT     Mobility  Bed Mobility               General bed mobility comments: in chair  Transfers Overall transfer level: Needs assistance Equipment used: Rolling walker (2 wheeled) Transfers: Sit to/from Stand Sit to Stand: Mod assist         General transfer comment: increased time, cues for scooting and lifting assist from recliner  Ambulation/Gait Ambulation/Gait assistance: Supervision;Min guard Gait Distance (Feet): 40 Feet Assistive device: Rolling walker (2 wheeled) Gait Pattern/deviations: Step-to pattern;Decreased stride length;Shuffle;Trunk flexed     General Gait Details: cues for postur, step length, spouse present and assisting safely with mobility   Stairs Stairs: Yes Stairs assistance: Min assist Stair Management: Two rails;Forwards;Step to pattern Number of  Stairs: 3(4" steps) General stair comments: cues for sequencing, step to with increased time, spouse assisting and cueing appropriately for 2 trials on steps   Wheelchair Mobility    Modified Rankin (Stroke Patients Only)       Balance Overall balance assessment: Needs assistance   Sitting balance-Leahy Scale: Fair       Standing balance-Leahy Scale: Poor Standing balance comment: reliant on UEs                             Cognition Arousal/Alertness: Awake/alert Behavior During Therapy: WFL for tasks assessed/performed Overall Cognitive Status: Impaired/Different from baseline                     Current Attention Level: Sustained   Following Commands: Follows one step commands with increased time     Problem Solving: Slow processing;Decreased initiation;Difficulty sequencing;Requires verbal cues;Requires tactile cues        Exercises Total Joint Exercises Ankle Circles/Pumps: AROM;10 reps;Both Quad Sets: AROM;Both;10 reps Short Arc Quad: AROM;Right;10 reps Heel Slides: AAROM;AROM;Both;10 reps Hip ABduction/ADduction: AROM;AAROM;Right;10 reps Straight Leg Raises: AAROM;Strengthening;Right;AROM;10 reps Goniometric ROM: 10 - 65    General Comments General comments (skin integrity, edema, etc.): Spouse present and assisting with ambulation and stairs this session after instructions on assist technique      Pertinent Vitals/Pain Faces Pain Scale: Hurts little more Pain Location: R knee  Pain Descriptors / Indicators: Aching;Sore Pain Intervention(s): Monitored during session;Repositioned;Patient requesting pain meds-RN notified    Home Living  Prior Function            PT Goals (current goals can now be found in the care plan section) Progress towards PT goals: Progressing toward goals    Frequency    7X/week      PT Plan Current plan remains appropriate    Co-evaluation               AM-PAC PT "6 Clicks" Daily Activity  Outcome Measure  Difficulty turning over in bed (including adjusting bedclothes, sheets and blankets)?: Unable Difficulty moving from lying on back to sitting on the side of the bed? : Unable Difficulty sitting down on and standing up from a chair with arms (e.g., wheelchair, bedside commode, etc,.)?: Unable Help needed moving to and from a bed to chair (including a wheelchair)?: A Little Help needed walking in hospital room?: A Little Help needed climbing 3-5 steps with a railing? : A Little 6 Click Score: 12    End of Session Equipment Utilized During Treatment: Gait belt Activity Tolerance: Patient tolerated treatment well Patient left: with call bell/phone within reach;in chair;with family/visitor present   PT Visit Diagnosis: Other abnormalities of gait and mobility (R26.89);Difficulty in walking, not elsewhere classified (R26.2);Pain Pain - Right/Left: Right Pain - part of body: Knee     Time: 1218-1259 PT Time Calculation (min) (ACUTE ONLY): 41 min  Charges:  $Gait Training: 8-22 mins $Therapeutic Exercise: 8-22 mins $Self Care/Home Management: 8-22                     Sheran LawlessCyndi Wynn, PT Acute Rehabilitation Services [phone] 04/25/2018    Elray Mcgregorynthia Wynn 04/25/2018, 2:39 PM

## 2018-04-25 NOTE — Evaluation (Signed)
Occupational Therapy Evaluation Patient Details Name: [name] MRN: [id number] DOB: Jun 13, 1940 Today's Date: 04/25/2018    History of Present Illness 77 yo female s/p R TKR on 04/21/18. pt became progressinvely more  lethargic night of surgery, rapid response was called- 3 doses of Narcan were given without improvement and pt was transferred to SDU; PMH includes OA, DMII, obesity, gout, diverticulosis, HTN.    Clinical Impression   Pt was admitted for the above sx and was admitted to SDU for lethargy. Plan is for home with family assistance.  Will follow in acute setting with the goals below, emphasizing safe bathroom transfers and family education to assist with adls.      Follow Up Recommendations  Follow surgeon's recommendation for DC plan and follow-up therapies;Supervision/Assistance - 24 hour    Equipment Recommendations  None recommended by OT    Recommendations for Other Services       Precautions / Restrictions Precautions Precautions: Fall;Knee Precaution Comments: KI not used Restrictions Other Position/Activity Restrictions: WBAT       Mobility Bed Mobility         Supine to sit: Min assist;Mod assist     General bed mobility comments: HOB raised; used rails. Slow movements and slow transition of trunk to sitting  Transfers   Equipment used: Rolling walker (2 wheeled)   Sit to Stand: Min assist;Mod assist;From elevated surface         General transfer comment: cues for hand placement; assist to rise and stabilize. Slow transition    Balance                                           ADL either performed or assessed with clinical judgement   ADL Overall ADL's : Needs assistance/impaired Eating/Feeding: Independent   Grooming: Set up;Sitting;Wash/dry hands   Upper Body Bathing: Set up;Sitting   Lower Body Bathing: Moderate assistance;Sit to/from stand   Upper Body Dressing : Set up;Sitting   Lower Body Dressing:  Total assistance;Sit to/from stand   Toilet Transfer: Minimal assistance;Moderate assistance;Stand-pivot;RW(chair)   Toileting- Clothing Manipulation and Hygiene: Maximal assistance;Sit to/from stand         General ADL Comments: slow transitions from sit to stand and for bed mobility.  Pt states husband and sisters will assist her at home today then daughter will be here late tonight     Vision         Perception     Praxis      Pertinent Vitals/Pain Faces Pain Scale: Hurts little more Pain Location: R knee  Pain Descriptors / Indicators: Sore Pain Intervention(s): Limited activity within patient's tolerance;Monitored during session;Premedicated before session;Repositioned(ice removed)     Hand Dominance     Extremity/Trunk Assessment Upper Extremity Assessment Upper Extremity Assessment: Generalized weakness(grossly 3+/5)           Communication Communication Communication: No difficulties   Cognition Arousal/Alertness: Awake/alert Behavior During Therapy: WFL for tasks assessed/performed;Flat affect                                   General Comments: slow to initiate movement   General Comments       Exercises     Shoulder Instructions      Home Living Family/patient expects to be discharged to:: Private residence Living Arrangements:  Spouse/significant other Available Help at Discharge: Family;Available 24 hours/day               Bathroom Shower/Tub: Walk-in shower(small ledge)   Bathroom Toilet: Standard     Home Equipment: Bedside commode;Shower seat          Prior Functioning/Environment Level of Independence: Independent with assistive device(s)                 OT Problem List: Decreased strength;Decreased activity tolerance;Impaired balance (sitting and/or standing);Decreased knowledge of use of DME or AE;Pain      OT Treatment/Interventions: Self-care/ADL training;DME and/or AE instruction;Patient/family  education;Balance training;Therapeutic activities    OT Goals(Current goals can be found in the care plan section) Acute Rehab OT Goals Patient Stated Goal: none stated OT Goal Formulation: With patient Time For Goal Achievement: 05/09/18 Potential to Achieve Goals: Good ADL Goals Pt Will Transfer to Toilet: with min assist;ambulating;stand pivot transfer;bedside commode Additional ADL Goal #1: Family will assist with LB adls with supervision, and pt will only need min A for sit to stand Additional ADL Goal #2: pt will demonstrate with min guard vs verbalize shower sequence  OT Frequency: Min 2X/week   Barriers to D/C:            Co-evaluation              AM-PAC PT "6 Clicks" Daily Activity     Outcome Measure Help from another person eating meals?: None Help from another person taking care of personal grooming?: A Little Help from another person toileting, which includes using toliet, bedpan, or urinal?: A Lot Help from another person bathing (including washing, rinsing, drying)?: A Lot Help from another person to put on and taking off regular upper body clothing?: A Little Help from another person to put on and taking off regular lower body clothing?: Total 6 Click Score: 15   End of Session    Activity Tolerance: Patient tolerated treatment well Patient left: in chair;with call bell/phone within reach  OT Visit Diagnosis: Pain Pain - Right/Left: Right Pain - part of body: Knee                Time: [id number] OT Time Calculation (min): 18 min Charges:  OT General Charges $OT Visit: 1 Visit OT Evaluation $OT Eval Low Complexity: 1 Low  [name], OTR/L Acute Rehabilitation Services [phone] [facility] pager [phone] office 04/25/2018  [name] 04/25/2018, 10:27 AM

## 2018-04-26 DIAGNOSIS — Z96651 Presence of right artificial knee joint: Secondary | ICD-10-CM | POA: Diagnosis not present

## 2018-04-26 DIAGNOSIS — Z6834 Body mass index (BMI) 34.0-34.9, adult: Secondary | ICD-10-CM | POA: Diagnosis not present

## 2018-04-26 DIAGNOSIS — I119 Hypertensive heart disease without heart failure: Secondary | ICD-10-CM | POA: Diagnosis not present

## 2018-04-26 DIAGNOSIS — Z471 Aftercare following joint replacement surgery: Secondary | ICD-10-CM | POA: Diagnosis not present

## 2018-04-26 DIAGNOSIS — E669 Obesity, unspecified: Secondary | ICD-10-CM | POA: Diagnosis not present

## 2018-04-26 DIAGNOSIS — Z7984 Long term (current) use of oral hypoglycemic drugs: Secondary | ICD-10-CM | POA: Diagnosis not present

## 2018-04-26 DIAGNOSIS — M1990 Unspecified osteoarthritis, unspecified site: Secondary | ICD-10-CM | POA: Diagnosis not present

## 2018-04-26 DIAGNOSIS — E1136 Type 2 diabetes mellitus with diabetic cataract: Secondary | ICD-10-CM | POA: Diagnosis not present

## 2018-04-27 ENCOUNTER — Other Ambulatory Visit: Payer: Self-pay

## 2018-04-27 NOTE — Patient Outreach (Signed)
[facility] Care Management  04/27/2018  Melanie BeamBessie Y Black 08-09-1940 [id number]   Referral Date: 04/27/18 Referral Source: [employer] Report Date of Admission: 04/21/18 Diagnosis: Right knee replacement Date of Discharge: 04/25/18 [facility]  Outreach attempt: spoke with patient. She reports she is doing ok. She reports she is up walking with a walker.  She states that her daughter is there assisting her.  She states that home health came on yesterday and will be coming back later this week and 3 x next week.  She reports that her pain has been minimal and she was able to get up this morning by herself. Patient states she will be having outpatient therapy after home therapy.  She states she has an appointment with the surgeon on Dec. 2nd and has transportation to appointment.  Discussed with patient knee precautions and following therapy and physician recommendations.  She verbalized understanding and declines any needs presently.     Plan: RN CM will close case.     [name] Signer, RN, MSN [facility] Care [facility] Care Management Coordinator Direct Line [phone] Toll Free: [phone]  Fax: [phone]

## 2018-04-28 DIAGNOSIS — E669 Obesity, unspecified: Secondary | ICD-10-CM | POA: Diagnosis not present

## 2018-04-28 DIAGNOSIS — Z471 Aftercare following joint replacement surgery: Secondary | ICD-10-CM | POA: Diagnosis not present

## 2018-04-28 DIAGNOSIS — Z7984 Long term (current) use of oral hypoglycemic drugs: Secondary | ICD-10-CM | POA: Diagnosis not present

## 2018-04-28 DIAGNOSIS — Z96651 Presence of right artificial knee joint: Secondary | ICD-10-CM | POA: Diagnosis not present

## 2018-04-28 DIAGNOSIS — E1136 Type 2 diabetes mellitus with diabetic cataract: Secondary | ICD-10-CM | POA: Diagnosis not present

## 2018-04-28 DIAGNOSIS — I119 Hypertensive heart disease without heart failure: Secondary | ICD-10-CM | POA: Diagnosis not present

## 2018-04-28 DIAGNOSIS — Z6834 Body mass index (BMI) 34.0-34.9, adult: Secondary | ICD-10-CM | POA: Diagnosis not present

## 2018-04-28 DIAGNOSIS — M1990 Unspecified osteoarthritis, unspecified site: Secondary | ICD-10-CM | POA: Diagnosis not present

## 2018-05-01 DIAGNOSIS — Z7984 Long term (current) use of oral hypoglycemic drugs: Secondary | ICD-10-CM | POA: Diagnosis not present

## 2018-05-01 DIAGNOSIS — E669 Obesity, unspecified: Secondary | ICD-10-CM | POA: Diagnosis not present

## 2018-05-01 DIAGNOSIS — E1136 Type 2 diabetes mellitus with diabetic cataract: Secondary | ICD-10-CM | POA: Diagnosis not present

## 2018-05-01 DIAGNOSIS — Z96651 Presence of right artificial knee joint: Secondary | ICD-10-CM | POA: Diagnosis not present

## 2018-05-01 DIAGNOSIS — Z471 Aftercare following joint replacement surgery: Secondary | ICD-10-CM | POA: Diagnosis not present

## 2018-05-01 DIAGNOSIS — I119 Hypertensive heart disease without heart failure: Secondary | ICD-10-CM | POA: Diagnosis not present

## 2018-05-01 DIAGNOSIS — Z6834 Body mass index (BMI) 34.0-34.9, adult: Secondary | ICD-10-CM | POA: Diagnosis not present

## 2018-05-01 DIAGNOSIS — M1990 Unspecified osteoarthritis, unspecified site: Secondary | ICD-10-CM | POA: Diagnosis not present

## 2018-05-02 DIAGNOSIS — M1990 Unspecified osteoarthritis, unspecified site: Secondary | ICD-10-CM | POA: Diagnosis not present

## 2018-05-02 DIAGNOSIS — Z471 Aftercare following joint replacement surgery: Secondary | ICD-10-CM | POA: Diagnosis not present

## 2018-05-02 DIAGNOSIS — E669 Obesity, unspecified: Secondary | ICD-10-CM | POA: Diagnosis not present

## 2018-05-02 DIAGNOSIS — E1136 Type 2 diabetes mellitus with diabetic cataract: Secondary | ICD-10-CM | POA: Diagnosis not present

## 2018-05-02 DIAGNOSIS — I119 Hypertensive heart disease without heart failure: Secondary | ICD-10-CM | POA: Diagnosis not present

## 2018-05-02 DIAGNOSIS — Z7984 Long term (current) use of oral hypoglycemic drugs: Secondary | ICD-10-CM | POA: Diagnosis not present

## 2018-05-02 DIAGNOSIS — Z6834 Body mass index (BMI) 34.0-34.9, adult: Secondary | ICD-10-CM | POA: Diagnosis not present

## 2018-05-02 DIAGNOSIS — Z96651 Presence of right artificial knee joint: Secondary | ICD-10-CM | POA: Diagnosis not present

## 2018-05-05 DIAGNOSIS — M1990 Unspecified osteoarthritis, unspecified site: Secondary | ICD-10-CM | POA: Diagnosis not present

## 2018-05-05 DIAGNOSIS — Z7984 Long term (current) use of oral hypoglycemic drugs: Secondary | ICD-10-CM | POA: Diagnosis not present

## 2018-05-05 DIAGNOSIS — Z96651 Presence of right artificial knee joint: Secondary | ICD-10-CM | POA: Diagnosis not present

## 2018-05-05 DIAGNOSIS — Z471 Aftercare following joint replacement surgery: Secondary | ICD-10-CM | POA: Diagnosis not present

## 2018-05-05 DIAGNOSIS — E669 Obesity, unspecified: Secondary | ICD-10-CM | POA: Diagnosis not present

## 2018-05-05 DIAGNOSIS — E1136 Type 2 diabetes mellitus with diabetic cataract: Secondary | ICD-10-CM | POA: Diagnosis not present

## 2018-05-05 DIAGNOSIS — Z6834 Body mass index (BMI) 34.0-34.9, adult: Secondary | ICD-10-CM | POA: Diagnosis not present

## 2018-05-05 DIAGNOSIS — I119 Hypertensive heart disease without heart failure: Secondary | ICD-10-CM | POA: Diagnosis not present

## 2018-05-08 DIAGNOSIS — I119 Hypertensive heart disease without heart failure: Secondary | ICD-10-CM | POA: Diagnosis not present

## 2018-05-08 DIAGNOSIS — Z7984 Long term (current) use of oral hypoglycemic drugs: Secondary | ICD-10-CM | POA: Diagnosis not present

## 2018-05-08 DIAGNOSIS — E1136 Type 2 diabetes mellitus with diabetic cataract: Secondary | ICD-10-CM | POA: Diagnosis not present

## 2018-05-08 DIAGNOSIS — M1990 Unspecified osteoarthritis, unspecified site: Secondary | ICD-10-CM | POA: Diagnosis not present

## 2018-05-08 DIAGNOSIS — Z96651 Presence of right artificial knee joint: Secondary | ICD-10-CM | POA: Diagnosis not present

## 2018-05-08 DIAGNOSIS — E669 Obesity, unspecified: Secondary | ICD-10-CM | POA: Diagnosis not present

## 2018-05-08 DIAGNOSIS — M1711 Unilateral primary osteoarthritis, right knee: Secondary | ICD-10-CM | POA: Diagnosis not present

## 2018-05-08 DIAGNOSIS — Z6834 Body mass index (BMI) 34.0-34.9, adult: Secondary | ICD-10-CM | POA: Diagnosis not present

## 2018-05-08 DIAGNOSIS — Z471 Aftercare following joint replacement surgery: Secondary | ICD-10-CM | POA: Diagnosis not present

## 2018-05-09 DIAGNOSIS — Z7984 Long term (current) use of oral hypoglycemic drugs: Secondary | ICD-10-CM | POA: Diagnosis not present

## 2018-05-09 DIAGNOSIS — Z96651 Presence of right artificial knee joint: Secondary | ICD-10-CM | POA: Diagnosis not present

## 2018-05-09 DIAGNOSIS — M1990 Unspecified osteoarthritis, unspecified site: Secondary | ICD-10-CM | POA: Diagnosis not present

## 2018-05-09 DIAGNOSIS — Z471 Aftercare following joint replacement surgery: Secondary | ICD-10-CM | POA: Diagnosis not present

## 2018-05-09 DIAGNOSIS — E1136 Type 2 diabetes mellitus with diabetic cataract: Secondary | ICD-10-CM | POA: Diagnosis not present

## 2018-05-09 DIAGNOSIS — I119 Hypertensive heart disease without heart failure: Secondary | ICD-10-CM | POA: Diagnosis not present

## 2018-05-09 DIAGNOSIS — Z6834 Body mass index (BMI) 34.0-34.9, adult: Secondary | ICD-10-CM | POA: Diagnosis not present

## 2018-05-09 DIAGNOSIS — E669 Obesity, unspecified: Secondary | ICD-10-CM | POA: Diagnosis not present

## 2018-05-10 DIAGNOSIS — Z7984 Long term (current) use of oral hypoglycemic drugs: Secondary | ICD-10-CM | POA: Diagnosis not present

## 2018-05-10 DIAGNOSIS — Z96651 Presence of right artificial knee joint: Secondary | ICD-10-CM | POA: Diagnosis not present

## 2018-05-10 DIAGNOSIS — Z471 Aftercare following joint replacement surgery: Secondary | ICD-10-CM | POA: Diagnosis not present

## 2018-05-10 DIAGNOSIS — E669 Obesity, unspecified: Secondary | ICD-10-CM | POA: Diagnosis not present

## 2018-05-10 DIAGNOSIS — Z6834 Body mass index (BMI) 34.0-34.9, adult: Secondary | ICD-10-CM | POA: Diagnosis not present

## 2018-05-10 DIAGNOSIS — M1990 Unspecified osteoarthritis, unspecified site: Secondary | ICD-10-CM | POA: Diagnosis not present

## 2018-05-10 DIAGNOSIS — E1136 Type 2 diabetes mellitus with diabetic cataract: Secondary | ICD-10-CM | POA: Diagnosis not present

## 2018-05-10 DIAGNOSIS — I119 Hypertensive heart disease without heart failure: Secondary | ICD-10-CM | POA: Diagnosis not present

## 2018-05-11 DIAGNOSIS — Z96659 Presence of unspecified artificial knee joint: Secondary | ICD-10-CM | POA: Diagnosis not present

## 2018-05-11 DIAGNOSIS — Z471 Aftercare following joint replacement surgery: Secondary | ICD-10-CM | POA: Diagnosis not present

## 2018-05-15 DIAGNOSIS — Z96659 Presence of unspecified artificial knee joint: Secondary | ICD-10-CM | POA: Diagnosis not present

## 2018-05-15 DIAGNOSIS — Z471 Aftercare following joint replacement surgery: Secondary | ICD-10-CM | POA: Diagnosis not present

## 2018-05-18 DIAGNOSIS — Z96659 Presence of unspecified artificial knee joint: Secondary | ICD-10-CM | POA: Diagnosis not present

## 2018-05-18 DIAGNOSIS — Z471 Aftercare following joint replacement surgery: Secondary | ICD-10-CM | POA: Diagnosis not present

## 2018-05-22 DIAGNOSIS — Z96659 Presence of unspecified artificial knee joint: Secondary | ICD-10-CM | POA: Diagnosis not present

## 2018-05-22 DIAGNOSIS — Z471 Aftercare following joint replacement surgery: Secondary | ICD-10-CM | POA: Diagnosis not present

## 2018-05-25 DIAGNOSIS — Z96659 Presence of unspecified artificial knee joint: Secondary | ICD-10-CM | POA: Diagnosis not present

## 2018-05-25 DIAGNOSIS — Z471 Aftercare following joint replacement surgery: Secondary | ICD-10-CM | POA: Diagnosis not present

## 2018-06-05 DIAGNOSIS — M1711 Unilateral primary osteoarthritis, right knee: Secondary | ICD-10-CM | POA: Diagnosis not present

## 2018-06-06 DIAGNOSIS — Z96659 Presence of unspecified artificial knee joint: Secondary | ICD-10-CM | POA: Diagnosis not present

## 2018-06-06 DIAGNOSIS — Z471 Aftercare following joint replacement surgery: Secondary | ICD-10-CM | POA: Diagnosis not present

## 2018-06-08 DIAGNOSIS — Z471 Aftercare following joint replacement surgery: Secondary | ICD-10-CM | POA: Diagnosis not present

## 2018-06-08 DIAGNOSIS — Z96659 Presence of unspecified artificial knee joint: Secondary | ICD-10-CM | POA: Diagnosis not present

## 2018-06-12 DIAGNOSIS — E1165 Type 2 diabetes mellitus with hyperglycemia: Secondary | ICD-10-CM | POA: Diagnosis not present

## 2018-06-12 DIAGNOSIS — D649 Anemia, unspecified: Secondary | ICD-10-CM | POA: Diagnosis not present

## 2018-06-12 DIAGNOSIS — I10 Essential (primary) hypertension: Secondary | ICD-10-CM | POA: Diagnosis not present

## 2018-06-12 DIAGNOSIS — E782 Mixed hyperlipidemia: Secondary | ICD-10-CM | POA: Diagnosis not present

## 2018-06-12 DIAGNOSIS — Z0000 Encounter for general adult medical examination without abnormal findings: Secondary | ICD-10-CM | POA: Diagnosis not present

## 2018-06-12 DIAGNOSIS — D509 Iron deficiency anemia, unspecified: Secondary | ICD-10-CM | POA: Diagnosis not present

## 2018-06-12 DIAGNOSIS — R944 Abnormal results of kidney function studies: Secondary | ICD-10-CM | POA: Diagnosis not present

## 2018-06-13 DIAGNOSIS — Z471 Aftercare following joint replacement surgery: Secondary | ICD-10-CM | POA: Diagnosis not present

## 2018-06-13 DIAGNOSIS — Z96659 Presence of unspecified artificial knee joint: Secondary | ICD-10-CM | POA: Diagnosis not present

## 2018-06-15 DIAGNOSIS — Z471 Aftercare following joint replacement surgery: Secondary | ICD-10-CM | POA: Diagnosis not present

## 2018-06-15 DIAGNOSIS — Z96659 Presence of unspecified artificial knee joint: Secondary | ICD-10-CM | POA: Diagnosis not present

## 2018-06-16 DIAGNOSIS — I10 Essential (primary) hypertension: Secondary | ICD-10-CM | POA: Diagnosis not present

## 2018-06-16 DIAGNOSIS — R3915 Urgency of urination: Secondary | ICD-10-CM | POA: Diagnosis not present

## 2018-06-16 DIAGNOSIS — E119 Type 2 diabetes mellitus without complications: Secondary | ICD-10-CM | POA: Diagnosis not present

## 2018-06-16 DIAGNOSIS — M109 Gout, unspecified: Secondary | ICD-10-CM | POA: Diagnosis not present

## 2018-06-16 DIAGNOSIS — D509 Iron deficiency anemia, unspecified: Secondary | ICD-10-CM | POA: Diagnosis not present

## 2018-06-16 DIAGNOSIS — R251 Tremor, unspecified: Secondary | ICD-10-CM | POA: Diagnosis not present

## 2018-06-16 DIAGNOSIS — M1711 Unilateral primary osteoarthritis, right knee: Secondary | ICD-10-CM | POA: Diagnosis not present

## 2018-06-16 DIAGNOSIS — E782 Mixed hyperlipidemia: Secondary | ICD-10-CM | POA: Diagnosis not present

## 2018-06-20 DIAGNOSIS — Z471 Aftercare following joint replacement surgery: Secondary | ICD-10-CM | POA: Diagnosis not present

## 2018-06-20 DIAGNOSIS — Z96659 Presence of unspecified artificial knee joint: Secondary | ICD-10-CM | POA: Diagnosis not present

## 2018-06-22 DIAGNOSIS — Z471 Aftercare following joint replacement surgery: Secondary | ICD-10-CM | POA: Diagnosis not present

## 2018-06-22 DIAGNOSIS — Z96659 Presence of unspecified artificial knee joint: Secondary | ICD-10-CM | POA: Diagnosis not present

## 2018-06-27 DIAGNOSIS — Z96659 Presence of unspecified artificial knee joint: Secondary | ICD-10-CM | POA: Diagnosis not present

## 2018-06-27 DIAGNOSIS — Z471 Aftercare following joint replacement surgery: Secondary | ICD-10-CM | POA: Diagnosis not present

## 2018-06-29 DIAGNOSIS — Z96659 Presence of unspecified artificial knee joint: Secondary | ICD-10-CM | POA: Diagnosis not present

## 2018-06-29 DIAGNOSIS — Z471 Aftercare following joint replacement surgery: Secondary | ICD-10-CM | POA: Diagnosis not present

## 2018-06-30 DIAGNOSIS — I10 Essential (primary) hypertension: Secondary | ICD-10-CM | POA: Diagnosis not present

## 2018-06-30 DIAGNOSIS — R21 Rash and other nonspecific skin eruption: Secondary | ICD-10-CM | POA: Diagnosis not present

## 2018-06-30 DIAGNOSIS — E1165 Type 2 diabetes mellitus with hyperglycemia: Secondary | ICD-10-CM | POA: Diagnosis not present

## 2018-06-30 DIAGNOSIS — D509 Iron deficiency anemia, unspecified: Secondary | ICD-10-CM | POA: Diagnosis not present

## 2018-06-30 DIAGNOSIS — E782 Mixed hyperlipidemia: Secondary | ICD-10-CM | POA: Diagnosis not present

## 2018-07-04 DIAGNOSIS — Z471 Aftercare following joint replacement surgery: Secondary | ICD-10-CM | POA: Diagnosis not present

## 2018-07-04 DIAGNOSIS — Z96659 Presence of unspecified artificial knee joint: Secondary | ICD-10-CM | POA: Diagnosis not present

## 2018-07-06 DIAGNOSIS — Z471 Aftercare following joint replacement surgery: Secondary | ICD-10-CM | POA: Diagnosis not present

## 2018-07-06 DIAGNOSIS — Z96659 Presence of unspecified artificial knee joint: Secondary | ICD-10-CM | POA: Diagnosis not present

## 2018-07-11 DIAGNOSIS — Z96659 Presence of unspecified artificial knee joint: Secondary | ICD-10-CM | POA: Diagnosis not present

## 2018-07-11 DIAGNOSIS — Z471 Aftercare following joint replacement surgery: Secondary | ICD-10-CM | POA: Diagnosis not present

## 2018-07-20 DIAGNOSIS — Z471 Aftercare following joint replacement surgery: Secondary | ICD-10-CM | POA: Diagnosis not present

## 2018-07-20 DIAGNOSIS — Z96659 Presence of unspecified artificial knee joint: Secondary | ICD-10-CM | POA: Diagnosis not present

## 2018-07-27 DIAGNOSIS — Z96659 Presence of unspecified artificial knee joint: Secondary | ICD-10-CM | POA: Diagnosis not present

## 2018-07-27 DIAGNOSIS — Z471 Aftercare following joint replacement surgery: Secondary | ICD-10-CM | POA: Diagnosis not present

## 2018-08-01 DIAGNOSIS — Z96659 Presence of unspecified artificial knee joint: Secondary | ICD-10-CM | POA: Diagnosis not present

## 2018-08-01 DIAGNOSIS — Z471 Aftercare following joint replacement surgery: Secondary | ICD-10-CM | POA: Diagnosis not present

## 2018-08-03 DIAGNOSIS — Z96659 Presence of unspecified artificial knee joint: Secondary | ICD-10-CM | POA: Diagnosis not present

## 2018-08-03 DIAGNOSIS — Z471 Aftercare following joint replacement surgery: Secondary | ICD-10-CM | POA: Diagnosis not present

## 2018-08-07 DIAGNOSIS — M170 Bilateral primary osteoarthritis of knee: Secondary | ICD-10-CM | POA: Diagnosis not present

## 2018-11-06 DIAGNOSIS — M25551 Pain in right hip: Secondary | ICD-10-CM | POA: Diagnosis not present

## 2018-11-06 DIAGNOSIS — M545 Low back pain: Secondary | ICD-10-CM | POA: Diagnosis not present

## 2018-11-13 DIAGNOSIS — M545 Low back pain: Secondary | ICD-10-CM | POA: Diagnosis not present

## 2018-11-14 DIAGNOSIS — M545 Low back pain: Secondary | ICD-10-CM | POA: Diagnosis not present

## 2018-11-15 IMAGING — CT CT CHEST W/ CM
2 of 3 series · 14 of 36 positions shown, 17 images · IV contrast (iopamidol)
Comparison: Chest CT 10/09/2016.

CLINICAL DATA: 75-year-old female with history of pneumonia.
Followup study.

EXAM:
CT CHEST WITH CONTRAST
TECHNIQUE: Multidetector CT imaging of the chest was performed during
intravenous contrast administration.
CONTRAST:  75mL S17TJK-MCC IOPAMIDOL (S17TJK-MCC) INJECTION 61%

[Series 2: axial st · axial · 0.78mm/px · z∈[-180,+62]mm · 11 of 143 slices shown, 14 images]
[im 11/143  mediastinal]
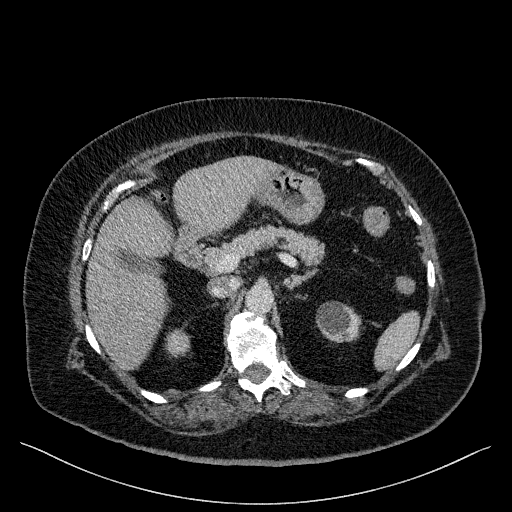
[im 11/143  lung]
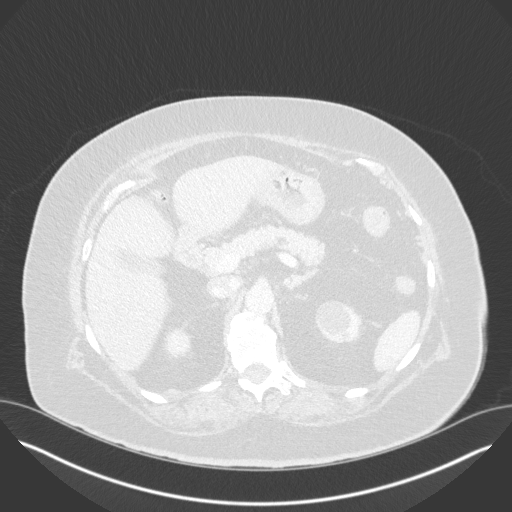
[im 22/143  lung]
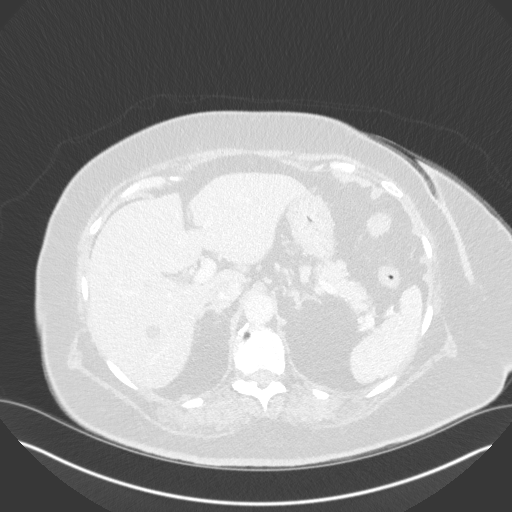
[im 32/143  lung]
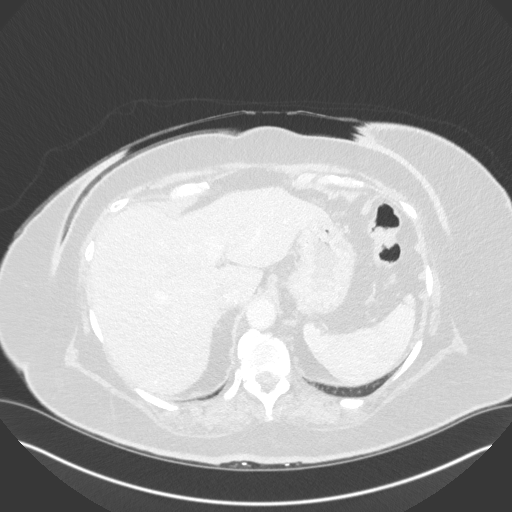
[im 48/143  lung]
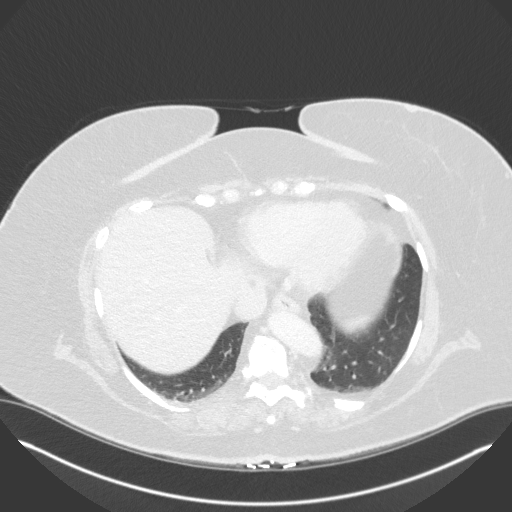
[im 58/143  mediastinal]
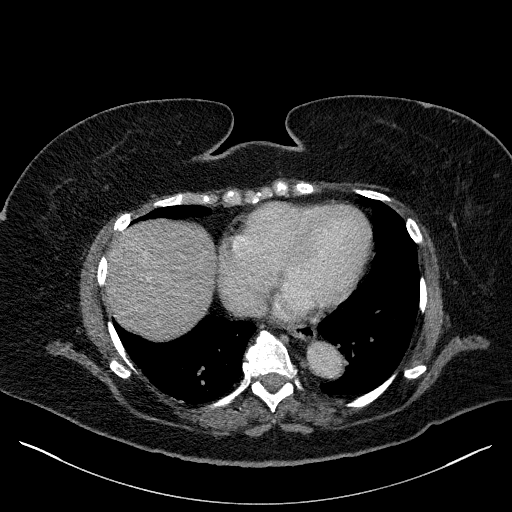
[im 58/143  lung]
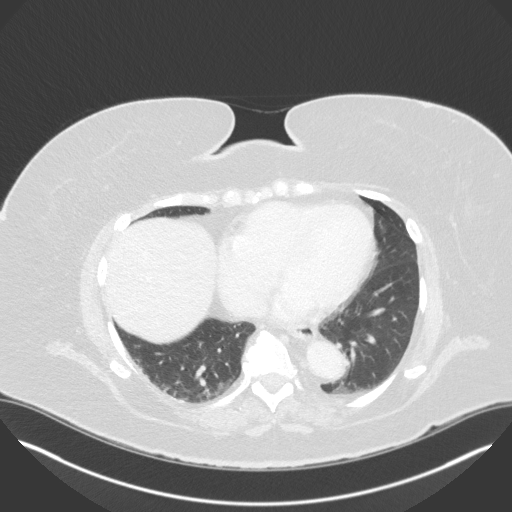
[im 74/143  lung]
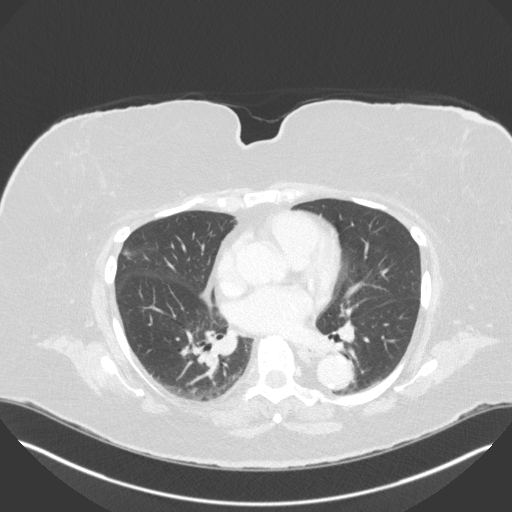
[im 85/143  lung]
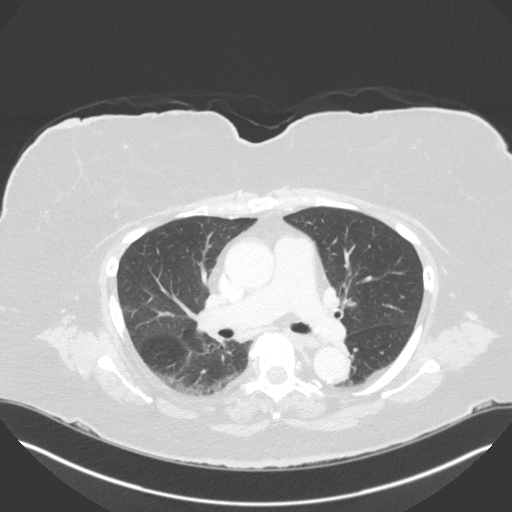
[im 95/143  lung]
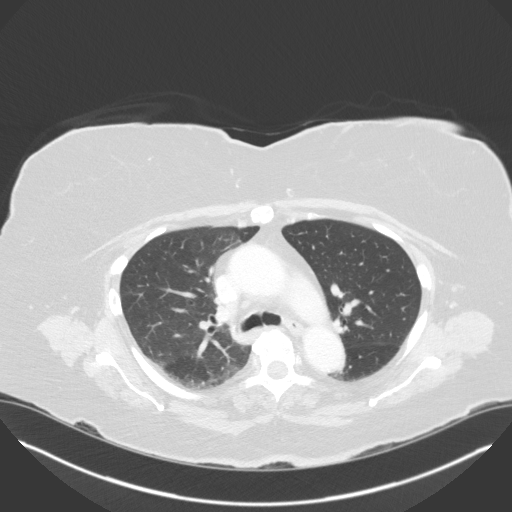
[im 111/143  mediastinal]
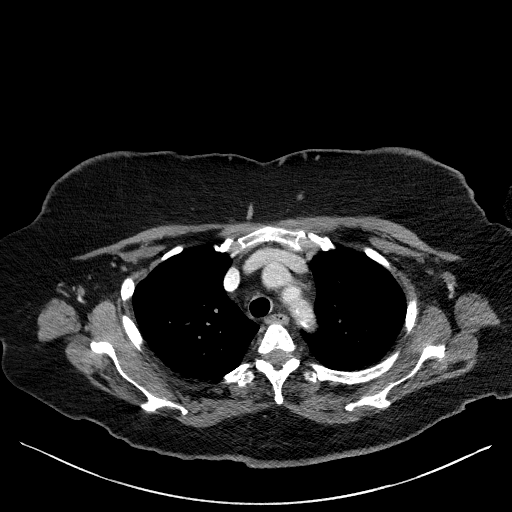
[im 111/143  lung]
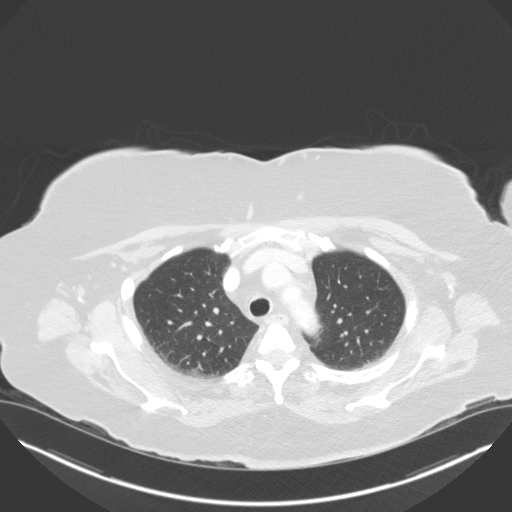
[im 121/143  lung]
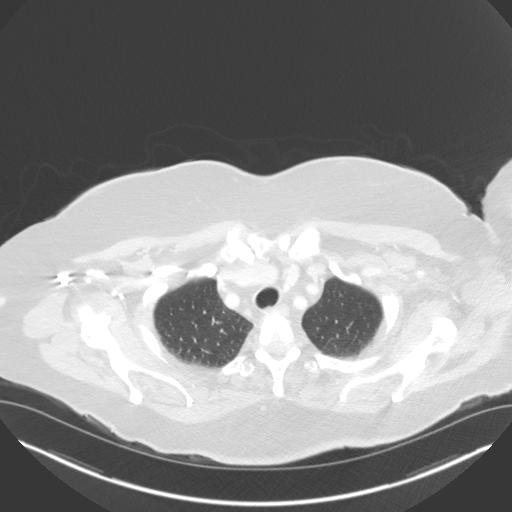
[im 132/143  lung]
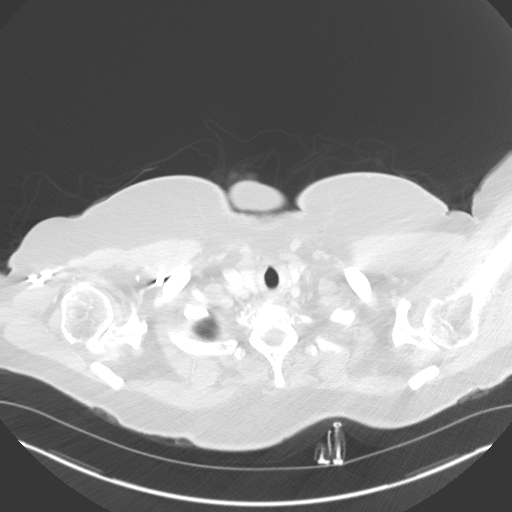

[Series 5: coronal · coronal · 0.56mm/px · 3 of 119 slices shown]
[im 24/119  lung]
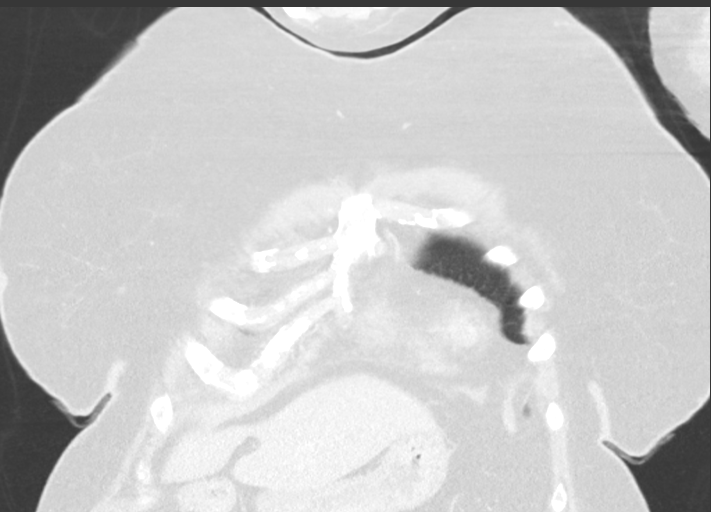
[im 48/119  lung]
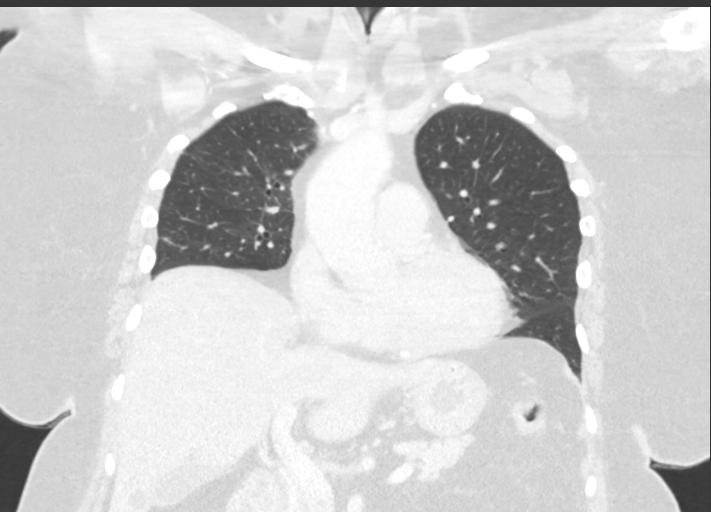
[im 71/119  lung]
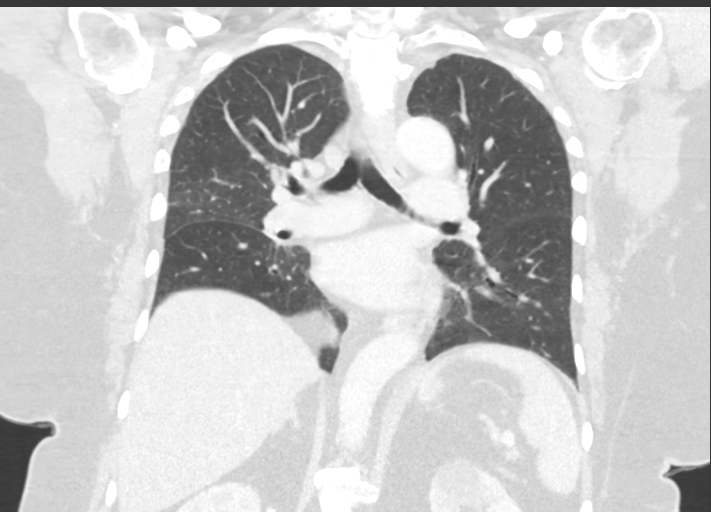

[14 of 36 positions shown; findings below may reference images not displayed]

FINDINGS: Cardiovascular: Heart size is mildly enlarged. There is no
significant pericardial fluid, thickening or pericardial
calcification. There is aortic atherosclerosis, as well as
atherosclerosis of the great vessels of the mediastinum and the
coronary arteries, including calcified atherosclerotic plaque in the
left circumflex coronary artery. Ectasia of ascending thoracic aorta
(4 cm in diameter).

Mediastinum/Nodes: No pathologically enlarged mediastinal or hilar
lymph nodes. Previously noted enlarged right paratracheal lymph node
has significantly decreased in size, currently only 8 mm in short
axis. Esophagus is unremarkable in appearance. No axillary
lymphadenopathy.

Lungs/Pleura: Previously noted consolidation in the right upper lobe
has resolved. No new consolidative airspace disease. No pleural
effusions. No suspicious appearing pulmonary nodules or masses.

Upper Abdomen: The low-attenuation lesions in the liver are similar
to the prior study, compatible with cysts, the largest of which
measures up to 2.4 x 2.1 cm in the right lobe between segments 6 and
7. Aortic atherosclerosis. Incompletely visualized calcified
aneurysm of the right renal artery measuring up to 1.3 cm in
diameter. Simple cyst in the upper pole of the right kidney
measuring 2.6 cm in diameter.

Musculoskeletal: There are no aggressive appearing lytic or blastic
lesions noted in the visualized portions of the skeleton.
IMPRESSION: 1. Resolution of previously noted right upper lobe pneumonia, and
regression of previously noted reactive mediastinal lymphadenopathy.
2. Aortic atherosclerosis, in addition to left circumflex coronary
artery disease. Assessment for potential risk factor modification,
dietary therapy or pharmacologic therapy may be warranted, if
clinically indicated.
3. In addition, there is ectasia of ascending thoracic aorta (4.0 cm
in diameter). Recommend annual imaging followup by CTA or MRA. This
recommendation follows 6252
ACCF/AHA/AATS/ACR/ASA/SCA/ESLIDE/AGONG/GRIMFANTASY/NADERI Guidelines for the
Diagnosis and Management of Patients with Thoracic Aortic Disease.
Circulation. 6252; 121: [id number].
4. 1.3 cm aneurysm of the right renal artery again noted.
5. Additional incidental findings, as above.

Aortic Atherosclerosis ([id number]).

## 2018-11-21 DIAGNOSIS — M6281 Muscle weakness (generalized): Secondary | ICD-10-CM | POA: Diagnosis not present

## 2018-11-21 DIAGNOSIS — R2681 Unsteadiness on feet: Secondary | ICD-10-CM | POA: Diagnosis not present

## 2018-11-21 DIAGNOSIS — M170 Bilateral primary osteoarthritis of knee: Secondary | ICD-10-CM | POA: Diagnosis not present

## 2018-12-06 DIAGNOSIS — M25511 Pain in right shoulder: Secondary | ICD-10-CM | POA: Diagnosis not present

## 2018-12-11 DIAGNOSIS — M25511 Pain in right shoulder: Secondary | ICD-10-CM | POA: Diagnosis not present

## 2018-12-22 DIAGNOSIS — I10 Essential (primary) hypertension: Secondary | ICD-10-CM | POA: Diagnosis not present

## 2018-12-22 DIAGNOSIS — D649 Anemia, unspecified: Secondary | ICD-10-CM | POA: Diagnosis not present

## 2018-12-22 DIAGNOSIS — E1165 Type 2 diabetes mellitus with hyperglycemia: Secondary | ICD-10-CM | POA: Diagnosis not present

## 2018-12-22 DIAGNOSIS — D509 Iron deficiency anemia, unspecified: Secondary | ICD-10-CM | POA: Diagnosis not present

## 2018-12-22 DIAGNOSIS — E782 Mixed hyperlipidemia: Secondary | ICD-10-CM | POA: Diagnosis not present

## 2018-12-27 DIAGNOSIS — I10 Essential (primary) hypertension: Secondary | ICD-10-CM | POA: Diagnosis not present

## 2018-12-27 DIAGNOSIS — R251 Tremor, unspecified: Secondary | ICD-10-CM | POA: Diagnosis not present

## 2018-12-27 DIAGNOSIS — E782 Mixed hyperlipidemia: Secondary | ICD-10-CM | POA: Diagnosis not present

## 2018-12-27 DIAGNOSIS — M109 Gout, unspecified: Secondary | ICD-10-CM | POA: Diagnosis not present

## 2018-12-27 DIAGNOSIS — E6609 Other obesity due to excess calories: Secondary | ICD-10-CM | POA: Diagnosis not present

## 2018-12-27 DIAGNOSIS — E1169 Type 2 diabetes mellitus with other specified complication: Secondary | ICD-10-CM | POA: Diagnosis not present

## 2018-12-27 DIAGNOSIS — R944 Abnormal results of kidney function studies: Secondary | ICD-10-CM | POA: Diagnosis not present

## 2018-12-27 DIAGNOSIS — D509 Iron deficiency anemia, unspecified: Secondary | ICD-10-CM | POA: Diagnosis not present

## 2018-12-27 DIAGNOSIS — M1711 Unilateral primary osteoarthritis, right knee: Secondary | ICD-10-CM | POA: Diagnosis not present

## 2019-02-09 DIAGNOSIS — I10 Essential (primary) hypertension: Secondary | ICD-10-CM | POA: Diagnosis not present

## 2019-02-09 DIAGNOSIS — E1165 Type 2 diabetes mellitus with hyperglycemia: Secondary | ICD-10-CM | POA: Diagnosis not present

## 2019-02-09 DIAGNOSIS — D509 Iron deficiency anemia, unspecified: Secondary | ICD-10-CM | POA: Diagnosis not present

## 2019-02-09 DIAGNOSIS — E782 Mixed hyperlipidemia: Secondary | ICD-10-CM | POA: Diagnosis not present

## 2019-03-13 DIAGNOSIS — Z23 Encounter for immunization: Secondary | ICD-10-CM | POA: Diagnosis not present

## 2019-04-12 DIAGNOSIS — E782 Mixed hyperlipidemia: Secondary | ICD-10-CM | POA: Diagnosis not present

## 2019-04-12 DIAGNOSIS — I10 Essential (primary) hypertension: Secondary | ICD-10-CM | POA: Diagnosis not present

## 2019-04-12 DIAGNOSIS — E1165 Type 2 diabetes mellitus with hyperglycemia: Secondary | ICD-10-CM | POA: Diagnosis not present

## 2019-04-12 DIAGNOSIS — D509 Iron deficiency anemia, unspecified: Secondary | ICD-10-CM | POA: Diagnosis not present

## 2019-05-30 DIAGNOSIS — E782 Mixed hyperlipidemia: Secondary | ICD-10-CM | POA: Diagnosis not present

## 2019-05-30 DIAGNOSIS — I10 Essential (primary) hypertension: Secondary | ICD-10-CM | POA: Diagnosis not present

## 2019-05-30 DIAGNOSIS — D509 Iron deficiency anemia, unspecified: Secondary | ICD-10-CM | POA: Diagnosis not present

## 2019-05-30 DIAGNOSIS — E1165 Type 2 diabetes mellitus with hyperglycemia: Secondary | ICD-10-CM | POA: Diagnosis not present

## 2019-06-18 DIAGNOSIS — D509 Iron deficiency anemia, unspecified: Secondary | ICD-10-CM | POA: Diagnosis not present

## 2019-06-18 DIAGNOSIS — I10 Essential (primary) hypertension: Secondary | ICD-10-CM | POA: Diagnosis not present

## 2019-06-18 DIAGNOSIS — E1165 Type 2 diabetes mellitus with hyperglycemia: Secondary | ICD-10-CM | POA: Diagnosis not present

## 2019-06-18 DIAGNOSIS — E7849 Other hyperlipidemia: Secondary | ICD-10-CM | POA: Diagnosis not present

## 2019-06-28 DIAGNOSIS — D509 Iron deficiency anemia, unspecified: Secondary | ICD-10-CM | POA: Diagnosis not present

## 2019-06-28 DIAGNOSIS — D649 Anemia, unspecified: Secondary | ICD-10-CM | POA: Diagnosis not present

## 2019-06-28 DIAGNOSIS — I10 Essential (primary) hypertension: Secondary | ICD-10-CM | POA: Diagnosis not present

## 2019-06-28 DIAGNOSIS — E119 Type 2 diabetes mellitus without complications: Secondary | ICD-10-CM | POA: Diagnosis not present

## 2019-06-28 DIAGNOSIS — E1165 Type 2 diabetes mellitus with hyperglycemia: Secondary | ICD-10-CM | POA: Diagnosis not present

## 2019-06-28 DIAGNOSIS — E782 Mixed hyperlipidemia: Secondary | ICD-10-CM | POA: Diagnosis not present

## 2019-07-04 DIAGNOSIS — I129 Hypertensive chronic kidney disease with stage 1 through stage 4 chronic kidney disease, or unspecified chronic kidney disease: Secondary | ICD-10-CM | POA: Diagnosis not present

## 2019-07-04 DIAGNOSIS — Z0000 Encounter for general adult medical examination without abnormal findings: Secondary | ICD-10-CM | POA: Diagnosis not present

## 2019-07-04 DIAGNOSIS — M109 Gout, unspecified: Secondary | ICD-10-CM | POA: Diagnosis not present

## 2019-07-04 DIAGNOSIS — E782 Mixed hyperlipidemia: Secondary | ICD-10-CM | POA: Diagnosis not present

## 2019-07-04 DIAGNOSIS — D509 Iron deficiency anemia, unspecified: Secondary | ICD-10-CM | POA: Diagnosis not present

## 2019-07-04 DIAGNOSIS — E1122 Type 2 diabetes mellitus with diabetic chronic kidney disease: Secondary | ICD-10-CM | POA: Diagnosis not present

## 2019-07-04 DIAGNOSIS — R251 Tremor, unspecified: Secondary | ICD-10-CM | POA: Diagnosis not present

## 2019-07-04 DIAGNOSIS — E1169 Type 2 diabetes mellitus with other specified complication: Secondary | ICD-10-CM | POA: Diagnosis not present

## 2019-07-04 DIAGNOSIS — M1711 Unilateral primary osteoarthritis, right knee: Secondary | ICD-10-CM | POA: Diagnosis not present

## 2019-07-11 ENCOUNTER — Other Ambulatory Visit: Payer: Self-pay

## 2019-07-11 ENCOUNTER — Ambulatory Visit: Payer: Medicare HMO | Admitting: Neurology

## 2019-07-11 ENCOUNTER — Encounter: Payer: Self-pay | Admitting: Neurology

## 2019-07-11 VITALS — BP 127/67 | HR 60 | Temp 97.6°F | Ht 62.0 in | Wt 183.0 lb

## 2019-07-11 DIAGNOSIS — G20 Parkinson's disease: Secondary | ICD-10-CM | POA: Diagnosis not present

## 2019-07-11 MED ORDER — CARBIDOPA-LEVODOPA 25-100 MG PO TABS: ORAL_TABLET | ORAL | 5 refills | Status: DC

## 2019-07-11 NOTE — Patient Instructions (Addendum)
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 This disease does progress with time. It can affect your balance, your memory, your mood, your bowel and bladder function, your posture, balance and walking and your activities of daily living. However, there are good supportive treatments and symptomatic treatments available, so most patients have a change to a good quality life and life expectancy is not typically altered. Overall you are doing fairly well but I do want to suggest a few things today:  Remember to drink plenty of fluid at least 6 glasses (8 oz each), eat healthy meals and do not skip any meals. Try to eat protein with a every meal and eat a healthy snack such as fruit or nuts in between meals. Try to keep a regular sleep-wake schedule and try to exercise daily, particularly in the form of walking, if you can. Use your Walker at all times.  Since you have not noticed much in the way of benefit from taking the propranolol for tremors, please talk to Dr. Margo Aye or Roe Rutherford about tapering it off.   I would like to start you on a medication called Sinemet (generic name: carbidopa-levodopa) 25/100 mg: Take half a pill twice daily (8 AM and noon) for one week, then half a pill 3 times a day (8 AM, noon, and 4 PM) for one week, then one pill 3 times a day thereafter. Please try to take the medication away from you mealtimes, that is, ideally either one hour before or 2 hours after your meal to ensure optimal absorption. The medication can interfere with the protein content of your meal and trying to the protein in your food and therefore not get fully absorbed.  Common side effects reported are: Nausea, vomiting, sedation, confusion, lightheadedness. Rare side effects include hallucinations, severe nausea or vomiting, diarrhea and significant drop in blood pressure especially when going from lying to standing or  from sitting to standing.   Please remember, no medication is without potential side effects and not every medication is right for every patient with PD.   I would like for you to see you back in 3 months.   For any emergencies you know to call 911 or go to the nearest emergency room.

## 2019-07-11 NOTE — Progress Notes (Deleted)
Subjective:    Patient ID: [name] is a 79 y.o. female.  HPI {Common ambulatory SmartLinks:19316}  Review of Systems  Neurological:       Pt in room 1 alone. She is here to discuss possible Parkinson's Disease. She requires the use of a rolling walker due to her slow, unsteady gait. She has bilateral hand tremors. Says she feels weak in general and is easily fatigued./mck     Objective:  Neurological Exam  Physical Exam  Assessment:   ***  Plan:   ***

## 2019-07-11 NOTE — Progress Notes (Deleted)
Pt in room 1 alone. She is here to discuss possible Parkinson's Disease. She requires the use of a rolling walker due to her slow, unsteady gait. She has bilateral hand tremors. Says she feels weak in general and is easily fatigued./mck

## 2019-07-11 NOTE — Progress Notes (Signed)
Subjective:    Patient ID: [name] is a 79 y.o. female.  HPI     [name] [facility] [address] P.O. [place],  [address]  Dear [name] and [name],   I saw your patient, [name], upon your kind request, in my neurologic clinic today for initial consultation of her tremors, concern for parkinsonism.  The patient is unaccompanied today. As you know, [name] is a 79 year old right-handed woman with an underlying complex medical history of hypertension, hyperlipidemia, diastolic dysfunction, chronic kidney disease, diverticulosis, diabetes, leg numbness, renal artery aneurysm, obesity, left ventricular hypertrophy, gout, arthritis, history of pneumonia, and history of migraines, who reports a hand tremor for the past several months, less than a year.  Started in her right hand.  She has noticed slowness and difficulty with fine motor skills.  She has had balance problems.  She had a right total knee replacement in October 2020 and fell in December 2020.  Thankfully, she did not injure her right knee but she had some pain.  She was advised to start gabapentin.  In the past, she had received injection into her lower back for lower back pain but  has not had any recent pain in the lower back.  She also reports that she may need a replacement of her left knee but currently does not have any significant left knee pain.  She had been using a 2 wheeled walker prior to her fall and actually had graduated to a cane after finishing therapy.  After the fall she started using her rolling walker.  She denies any significant constipation, she is not aware of any family history of Parkinson's disease.  She denies any significant memory issues.  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  She is a non-smoker and does not drink any alcohol.  She drinks caffeine and very little amounts.  She  reports some difficulty sleeping, she has nocturia and also reports incontinence at night and also during the day, wears depends.  I reviewed your office note from 07/04/2019.  She was noted to have bilateral hand tremors.  She was advised to continue with the propranolol.  She has been on it for a few months as I understand.  She does not notice much in the way of benefit from it.  Her Past Medical History Is Significant For: Past Medical History:  Diagnosis Date  . Aortic atherosclerosis ([place]) 09/11/2016   noted on CXR  . Arthritis   . Baker's cyst, left 2014   Small, Left  . Bilateral leg numbness   . Cataract    Bilateral  . Diabetes mellitus without complication ([place])   . Diverticulosis 10/09/2016   Noted on CT abd/pelvis  . Gout   . Grade I diastolic dysfunction 12/75/1700   Noted on ECHO  . 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 History of iron deficiency anemia   . History of migraine   . Hypercholesteremia   . Hypertension   . Internal hemorrhoids   . Lateral meniscal tear 2014   Left  . LVH (left ventricular hypertrophy) 03/03/2018   noted on EKG  . Obesity   . PMB (postmenopausal bleeding)   . Pneumonia 2018   Right upper lower  . PONV (postoperative nausea and vomiting)   . 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 Thoracic aortic aneurysm ([facility]) 12/09/2016   ascending 4.0 cm, noted on CT chest, pt unaware    Her Past Surgical History Is Significant For: Past Surgical History:  Procedure Laterality Date  . ABDOMINAL HYSTERECTOMY    . CATARACT EXTRACTION Right 09/2010  . CATARACT EXTRACTION Left 10/2010  . COLONOSCOPY    . DILATION AND CURETTAGE OF UTERUS    . TOTAL KNEE ARTHROPLASTY Right 04/21/2018   Procedure: TOTAL KNEE ARTHROPLASTY;  Surgeon: [name];  Location: [facility];  Service: Orthopedics;  Laterality: Right;  Adductor Block     Her Family History Is Significant For: Family History  Problem Relation Age of Onset  . Kidney disease Mother   . Diabetes Mother   . Congestive Heart Failure Mother   . Cancer Father        unsure of type    Her Social History Is Significant For: Social History   Socioeconomic History  . Marital status: Married    Spouse name: Not on file  . Number of children: 2  . Years of education: 65  . Highest education level: High school graduate  Occupational History  . Occupation: Retired  Tobacco Use  . Smoking status: Never Smoker  . Smokeless tobacco: Never Used  Substance and Sexual Activity  . Alcohol use: No  . Drug use: No  . Sexual activity: Not on file  Other Topics Concern  . Not on file  Social History Narrative   Lives at home with her husband.   Right-handed.   1/4 cup coffee per day.   Social Determinants of Health   Financial Resource Strain:   . Difficulty of Paying Living Expenses: Not on file  Food Insecurity:   . Worried About Programme researcher, broadcasting/film/video in the Last Year: Not on file  . Ran Out of Food in the Last Year: Not on file  Transportation Needs:   . Lack of Transportation (Medical): Not on file  . Lack of Transportation (Non-Medical): Not on file  Physical Activity:   . Days of Exercise per Week: Not on file  . Minutes of Exercise per Session: Not on file  Stress:   . Feeling of Stress : Not on file  Social Connections:   . Frequency of Communication with Friends and Family: Not on file  . Frequency of Social Gatherings with Friends and Family: Not on file  . Attends Religious Services: Not on file  . Active Member of Clubs or Organizations: Not on file  . Attends Banker Meetings: Not on file  . Marital Status: Not on file    Her Allergies Are:  No Known Allergies:   Her Current Medications Are:  Outpatient Encounter Medications as of 07/11/2019  Medication Sig  . acetaminophen (TYLENOL) 500 MG tablet Take 500 mg by mouth  every 6 (six) hours as needed for moderate pain.   Marland Kitchen amLODipine (NORVASC) 5 MG tablet Take 5 mg by mouth daily.   . chlorthalidone (HYGROTON) 25 MG tablet Take 12.5 mg by mouth daily.  Marland Kitchen gabapentin (NEURONTIN) 300 MG capsule Take 1 capsule by mouth at bedtime.  . metFORMIN (GLUCOPHAGE) 1000 MG tablet Take 1,000 mg by mouth daily with breakfast.   . olmesartan (BENICAR) 20 MG tablet Take 20 mg by mouth daily.  . propranolol (INDERAL) 40 MG tablet Take 40 mg by mouth 2 (two) times  daily.  . simvastatin (ZOCOR) 40 MG tablet Take 40 mg by mouth every evening.  . traMADol (ULTRAM) 50 MG tablet Take 1-2 tabs po q6hrs prn pain, start with lowest effective dose.  . carbidopa-levodopa (SINEMET IR) 25-100 MG tablet Take 1/2 pill twice daily x 1 week, then 1/2 pill 3 times a day x 1 week, then 1 pill 3 times a day thereafter.  . [DISCONTINUED] allopurinol (ZYLOPRIM) 100 MG tablet Take 100 mg by mouth daily.   No facility-administered encounter medications on file as of 07/11/2019.  :   Review of Systems:  Out of a complete 14 point review of systems, all are reviewed and negative with the exception of these symptoms as listed below:  Review of Systems  Neurological:       Pt in room 1 alone. She is here to discuss possible Parkinson's Disease. She requires the use of a rolling walker due to her slow, unsteady gait. She has bilateral hand tremors. Says she feels weak in general and is easily fatigued./mck     Objective:  Neurological Exam  Physical Exam Physical Examination:   Vitals:   07/11/19 1100  BP: 127/67  Pulse: 60  Temp: 97.6 F (36.4 C)    General Examination: The patient is a very pleasant 79 y.o. female in no acute distress. She appears well-developed and well-nourished and well groomed.   HEENT: Normocephalic, atraumatic, pupils are equal, round and reactive to light. Extraocular tracking is mildly impaired, she has mild to moderate facial masking, she has jaw tremor.  She has  moderate nuchal rigidity.  She has intact hearing.  Airway examination reveals mild mouth dryness, otherwise nonfocal findings, tongue protrudes centrally and palate elevates symmetrically.  Chest: Clear to auscultation without wheezing, rhonchi or crackles noted.  Heart: S1+S2+0, regular and normal without murmurs, rubs or gallops noted.   Abdomen: Soft, non-tender and non-distended with normal bowel sounds appreciated on auscultation.  Extremities: There is no pitting edema in the distal lower extremities bilaterally. Pedal pulses are intact.  Skin: Warm and dry without trophic changes noted.  Musculoskeletal: exam reveals no obvious joint deformities, tenderness or joint swelling or erythema. Denies any knee discomfort.  Neurologically:  Mental status: The patient is awake, alert and oriented in all 4 spheres. Her immediate and remote memory, attention, language skills and fund of knowledge are Fairly appropriate, she is somewhat slow in her thinking and in her responses.  She does have hypophonia, no obvious dysarthria.  Mood and affect are normal. Cranial nerves II - XII are as described above under HEENT exam. In addition: shoulder shrug is normal with equal shoulder height noted. Motor exam: Normal bulk, Global strength of 4+ out of 5, she has moderate resting tremor in the right upper extremity, slightly less prominent in the left upper extremity and almost intermittent in the left upper extremity, no lower extremity resting tremor.  She has a mild postural tremor, minimal action tremor.  Spiral drawing is impaired with both hands. Reflexes are 1+ in the upper extremities and diminished in the lower extremities.  Romberg is not tested due to safety concerns.  She has moderate to severe difficulty with fine motor skills in the right upper extremity with finger taps, hand movements and rapid alternating padding, left-sided slightly better.  With foot taps she has moderate difficulty  bilaterally.  She stands up with difficulty, takes 4 attempts and pushes herself up but requires no assistance.  She uses her rolling walker slowly and cautiously.  She has trouble with turns.  Balance is impaired.  Assessment and Plan:   Assessment and Plan:  In summary, MARESHA ANASTOS is a very pleasant 79 y.o.-year old female with an underlying complex medical history of hypertension, hyperlipidemia, diastolic dysfunction, chronic kidney disease, diverticulosis, diabetes, leg numbness, renal artery aneurysm, obesity, left ventricular hypertrophy, gout, arthritis, history of pneumonia, and history of migraines, who Presents for evaluation of her tremors, concern for parkinsonism.  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  I had a long chat with the patient about Her symptoms, my findings and the diagnosis of parkinsonism/Parksinson's disease, its prognosis and treatment options. We talked about medical treatments and non-pharmacological approaches. We talked about maintaining a healthy lifestyle in general. I encouraged the patient to eat healthy, exercise daily and keep well hydrated, to keep a scheduled bedtime and wake time routine, to not skip any meals and eat healthy snacks in between meals and to have protein with every meal. In particular, I stressed the importance of regular exercise, within of course the patient's own mobility limitations. We talked about the importance of fall prevention.  She is encouraged to use her walker at all times.  As far as medications are concerned, I recommended the following at this time: I would like for her to start Sinemet generic, 25-100 mg strength half a pill twice daily with slow titration to 1 pill 3 times daily for now.  She is not noticing any significant benefit from propranolol.  She is encouraged to talk to you about tapering this medication off if you agree. I would like to see her back in 3  months, sooner if needed.  I answered all her questions today and she was in agreement. Thank you very much for allowing me to participate in the care of this nice patient. If I can be of any further assistance to you please do not hesitate to call me at [phone].  Sincerely,   Huston Foley, Black, Black

## 2019-07-30 DIAGNOSIS — E1165 Type 2 diabetes mellitus with hyperglycemia: Secondary | ICD-10-CM | POA: Diagnosis not present

## 2019-07-30 DIAGNOSIS — D509 Iron deficiency anemia, unspecified: Secondary | ICD-10-CM | POA: Diagnosis not present

## 2019-07-30 DIAGNOSIS — E782 Mixed hyperlipidemia: Secondary | ICD-10-CM | POA: Diagnosis not present

## 2019-07-30 DIAGNOSIS — I10 Essential (primary) hypertension: Secondary | ICD-10-CM | POA: Diagnosis not present

## 2019-08-14 DIAGNOSIS — D509 Iron deficiency anemia, unspecified: Secondary | ICD-10-CM | POA: Diagnosis not present

## 2019-08-14 DIAGNOSIS — E782 Mixed hyperlipidemia: Secondary | ICD-10-CM | POA: Diagnosis not present

## 2019-08-14 DIAGNOSIS — I10 Essential (primary) hypertension: Secondary | ICD-10-CM | POA: Diagnosis not present

## 2019-08-14 DIAGNOSIS — E1165 Type 2 diabetes mellitus with hyperglycemia: Secondary | ICD-10-CM | POA: Diagnosis not present

## 2019-09-18 DIAGNOSIS — B351 Tinea unguium: Secondary | ICD-10-CM | POA: Diagnosis not present

## 2019-09-18 DIAGNOSIS — E119 Type 2 diabetes mellitus without complications: Secondary | ICD-10-CM | POA: Diagnosis not present

## 2019-09-18 DIAGNOSIS — L851 Acquired keratosis [keratoderma] palmaris et plantaris: Secondary | ICD-10-CM | POA: Diagnosis not present

## 2019-10-10 ENCOUNTER — Encounter: Payer: Self-pay | Admitting: Neurology

## 2019-10-10 ENCOUNTER — Ambulatory Visit: Payer: Medicare HMO | Admitting: Neurology

## 2019-10-10 ENCOUNTER — Other Ambulatory Visit: Payer: Self-pay

## 2019-10-10 VITALS — BP 132/94 | HR 76 | Temp 97.3°F | Ht 62.0 in | Wt 182.0 lb

## 2019-10-10 DIAGNOSIS — G20 Parkinson's disease: Secondary | ICD-10-CM

## 2019-10-10 MED ORDER — CARBIDOPA-LEVODOPA 25-100 MG PO TABS: 1.0000 | ORAL_TABLET | Freq: Four times a day (QID) | ORAL | 3 refills | Status: DC

## 2019-10-10 NOTE — Patient Instructions (Signed)
As discussed, we will increase your Sinemet to 1 pill four times a day, namely at 6 AM, 10 AM, 2 PM and 6 PM.  Please continue to use your walker at all times for gait safety.  Please try to hydrate well with water.  For your urinary frequency and bladder dyscontrol and incontinence, please talk to your primary care physician about seeing a urologist.

## 2019-10-10 NOTE — Progress Notes (Signed)
Subjective:    Patient ID: [name] is a 79 y.o. female.  HPI     Interim history:   [name] is a 79 year old right-handed woman with an underlying complex medical history of hypertension, hyperlipidemia, diastolic dysfunction, chronic kidney disease, diverticulosis, diabetes, leg numbness, renal artery aneurysm, obesity, left ventricular hypertrophy, gout, arthritis, history of pneumonia, and history of migraines, who presents for FU consultation of her parkinsonism. The patient is unaccompanied today.  I first met her on 07/11/2019 at the request of her primary care provider, at which time she reported a several month history of hand tremors, right side more than left and also additional motor symptoms including slowness.  She had on examination findings in keeping with parkinsonism with right-sided lateralization noted.  Her history and exam were in keeping with right-sided Parkinson's disease and she was advised to start Sinemet at low-dose with gradual titration.  Today, 10/10/2019: She reports that the Sinemet has helped her tremor.  She tolerates it.  She has noticed more bladder dyscontrol and urinary incontinence, some during the day and also at night.  She has never seen urology.  She denies any symptoms concerning for urinary tract infection.  She has an appointment for blood work and follow-up with her primary care physician later this month.  She has an appointment for her eye exam tomorrow.  She has taken the Covid vaccine, had some side effects after the second shot including feeling weaker and sore in the arm for about 24 hours but no lasting issues.  She denies constipation, sleeps well, mood is stable, no falls.  She uses a rolling walker.  She has residual right knee pain.  She had right knee replacement.  She also reports that she really needs a left knee replacement but is reluctant to pursue this.  She no longer takes propranolol.  The patient's allergies, current  medications, family history, past medical history, past social history, past surgical history and problem list were reviewed and updated as appropriate.   Previously:   07/11/19: (She) reports a hand tremor for the past several months, less than a year.  Started in her right hand.  She has noticed slowness and difficulty with fine motor skills.  She has had balance problems.  She had a right total knee replacement in October 2020 and fell in December 2020.  Thankfully, she did not injure her right knee but she had some pain.  She was advised to start gabapentin.  In the past, she had received injection into her lower back for lower back pain but  has not had any recent pain in the lower back.  She also reports that she may need a replacement of her left knee but currently does not have any significant left knee pain.  She had been using a 2 wheeled walker prior to her fall and actually had graduated to a cane after finishing therapy.  After the fall she started using her rolling walker.  She denies any significant constipation, she is not aware of any family history of Parkinson's disease.  She denies any significant memory issues.  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  She is a non-smoker and does not drink any alcohol.  She drinks caffeine and very little amounts.  She reports some difficulty sleeping, she has nocturia and also reports incontinence at night and also during the day, wears depends.  I reviewed your office  note from 07/04/2019.  She was noted to have bilateral hand tremors.  She was advised to continue with the propranolol.  She has been on it for a few months as I understand.  She does not notice much in the way of benefit from it.  Her Past Medical History Is Significant For: Past Medical History:  Diagnosis Date  . Aortic atherosclerosis ([place]) 09/11/2016   noted on CXR  . Arthritis   . Baker's cyst, left 2014   Small, Left  .  Bilateral leg numbness   . Cataract    Bilateral  . Diabetes mellitus without complication ([place])   . Diverticulosis 10/09/2016   Noted on CT abd/pelvis  . Gout   . Grade I diastolic dysfunction 53/29/9242   Noted on ECHO  . 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 History of iron deficiency anemia   . History of migraine   . Hypercholesteremia   . Hypertension   . Internal hemorrhoids   . Lateral meniscal tear 2014   Left  . LVH (left ventricular hypertrophy) 03/03/2018   noted on EKG  . Obesity   . PMB (postmenopausal bleeding)   . Pneumonia 2018   Right upper lower  . PONV (postoperative nausea and vomiting)   . 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 Thoracic aortic aneurysm ([place]) 12/09/2016   ascending 4.0 cm, noted on CT chest, pt unaware    Her Past Surgical History Is Significant For: Past Surgical History:  Procedure Laterality Date  . ABDOMINAL HYSTERECTOMY    . CATARACT EXTRACTION Right 09/2010  . CATARACT EXTRACTION Left 10/2010  . COLONOSCOPY    . DILATION AND CURETTAGE OF UTERUS    . TOTAL KNEE ARTHROPLASTY Right 04/21/2018   Procedure: TOTAL KNEE ARTHROPLASTY;  Surgeon: Earlie Server, MD;  Location: [facility];  Service: Orthopedics;  Laterality: Right;  Adductor Block    Her Family History Is Significant For: Family History  Problem Relation Age of Onset  . Kidney disease Mother   . Diabetes Mother   . Congestive Heart Failure Mother   . Cancer Father        unsure of type    Her Social History Is Significant For: Social History   Socioeconomic History  . Marital status: Married    Spouse name: Not on file  . Number of children: 2  . Years of education: 70  . Highest education level: High school graduate  Occupational History  . Occupation: Retired  Tobacco Use  . Smoking status: Never Smoker  . Smokeless tobacco: Never Used   Substance and Sexual Activity  . Alcohol use: No  . Drug use: No  . Sexual activity: Not on file  Other Topics Concern  . Not on file  Social History Narrative   Lives at home with her husband.   Right-handed.   1/4 cup coffee per day.   Social Determinants of Health   Financial Resource Strain:   . Difficulty of Paying Living Expenses:   Food Insecurity:   . Worried About Charity fundraiser in the Last Year:   . Arboriculturist in the Last Year:   Transportation Needs:   . Film/video editor (Medical):   Marland Kitchen Lack of Transportation (Non-Medical):   Physical Activity:   . Days of Exercise per Week:   .  Minutes of Exercise per Session:   Stress:   . Feeling of Stress :   Social Connections:   . Frequency of Communication with Friends and Family:   . Frequency of Social Gatherings with Friends and Family:   . Attends Religious Services:   . Active Member of Clubs or Organizations:   . Attends Archivist Meetings:   Marland Kitchen Marital Status:     Her Allergies Are:  No Known Allergies:   Her Current Medications Are:  Outpatient Encounter Medications as of 10/10/2019  Medication Sig  . acetaminophen (TYLENOL) 500 MG tablet Take 500 mg by mouth every 6 (six) hours as needed for moderate pain.   Marland Kitchen amLODipine (NORVASC) 5 MG tablet Take 5 mg by mouth daily.   . carbidopa-levodopa (SINEMET IR) 25-100 MG tablet Take 1/2 pill twice daily x 1 week, then 1/2 pill 3 times a day x 1 week, then 1 pill 3 times a day thereafter. (Patient taking differently: 3 times a day.)  . chlorthalidone (HYGROTON) 25 MG tablet Take 12.5 mg by mouth daily.  Marland Kitchen gabapentin (NEURONTIN) 300 MG capsule Take 1 capsule by mouth at bedtime.  . metFORMIN (GLUCOPHAGE) 1000 MG tablet Take 1,000 mg by mouth daily with breakfast.   . olmesartan (BENICAR) 20 MG tablet Take 20 mg by mouth daily.  . simvastatin (ZOCOR) 40 MG tablet Take 40 mg by mouth every evening.  . traMADol (ULTRAM) 50 MG tablet Take 1-2 tabs  po q6hrs prn pain, start with lowest effective dose.  . [DISCONTINUED] propranolol (INDERAL) 40 MG tablet Take 40 mg by mouth 2 (two) times daily.   No facility-administered encounter medications on file as of 10/10/2019.  :  Review of Systems:  Out of a complete 14 point review of systems, all are reviewed and negative with the exception of these symptoms as listed below: Review of Systems  Neurological:       Here for 3 month f/u on PD. Reports she has been doing ok since last visit. She struggles staying hydrated. No falls, she is tolerating the sinemet well.     Objective:  Neurological Exam  Physical Exam Physical Examination:   Vitals:   10/10/19 1050  BP: (!) 132/94  Pulse: 76  Temp: (!) 97.3 F (36.3 C)    General Examination: The patient is a very pleasant 79 y.o. female in no acute distress. She appears well-developed and well-nourished and well groomed.   HEENT: Normocephalic, atraumatic, pupils are equal, round and reactive to light. Extraocular tracking is mildly impaired, she has mild to moderate facial masking, she has jaw tremor.  She has moderate nuchal rigidity.  She has intact hearing.  Airway examination reveals mild mouth dryness, otherwise nonfocal findings, tongue protrudes centrally and palate elevates symmetrically.  Mild hypophonia noted, no voice tremor, no dysarthria.  Chest: Clear to auscultation without wheezing, rhonchi or crackles noted.  Heart: S1+S2+0, regular and normal without murmurs, rubs or gallops noted.   Abdomen: Soft, non-tender and non-distended with normal bowel sounds appreciated on auscultation.  Extremities: There is b/l pitting edema in the ankles, L more than R.   Skin: Warm and dry without trophic changes noted.  Musculoskeletal: exam reveals no obvious joint deformities, tenderness or joint swelling or erythema. R knee discomfort.  Neurologically:  Mental status: The patient is awake, alert and oriented in all 4  spheres. Her immediate and remote memory, attention, language skills and fund of knowledge are Fairly appropriate, she is somewhat slow in  her thinking and in her responses.  She does have hypophonia, no obvious dysarthria.  Mood and affect are normal. Cranial nerves II - XII are as described above under HEENT exam. In addition: shoulder shrug is normal with equal shoulder height noted. Motor exam: Normal bulk, Global strength of 4+ out of 5, she has a mild to moderate resting tremor in the right upper extremity, slight to mild in the left upper extremity, no lower extremity resting tremor.  She has a mild postural tremor, minimal action tremor.  Romberg is not tested due to safety concerns.  She has moderate difficulty with fine motor skills on the right upper and lower extremities, mildly to moderately impaired on the left side.  She stands up with difficulty, takes 2 attempts and pushes herself up but requires no assistance.  She uses her rolling walker slowly and cautiously.  She has better turns.  Balance is mildly impaired.  Assessment and Plan:   In summary, ASYAH CANDLER is a very pleasant 79 year old female with an underlying complex medical history of hypertension, hyperlipidemia, diastolic dysfunction, chronic kidney disease, diverticulosis, diabetes, leg numbness, renal artery aneurysm, obesity, left ventricular hypertrophy, gout, arthritis, history of pneumonia, and history of migraines, who Presents for follow-up consultation of her parkinsonism with history and exam in keeping with right-sided predominant Parkinson's disease.  She has had some response to levodopa therapy, has been able to tolerate Sinemet 1 pill 3 times daily.  She currently takes it around 6 AM, 11 AM and 3 PM.  She has had some improvements in her examination as well which is reassuring.  She has had issues with bladder control.  She is encouraged to keep her primary care physician's appointment later this month and talk  to her PCP about a referral to urology.  Thankfully, she has not fallen, she is advised to stay well-hydrated, monitor for constipation issues and use her walker at all times.  Furthermore, I would like for her to increase her Sinemet to 1 pill four times a day, namely at six, 10, 2 PM and 6 PM.  She is agreeable.  I changed her prescription to 90 days for her mail order pharmacy.  She is advised to follow-up routinely to see one of our nurse practitioners in 3 months for a recheck and if she does well at the time, we can hopefully see her on a six monthly basis if she is stable.  I answered all her questions today and she was in agreement. I spent 20 minutes in total face-to-face time and in reviewing records during pre-charting, more than 50% of which was spent in counseling and coordination of care, reviewing test results, reviewing medications and treatment regimen and/or in discussing or reviewing the diagnosis of PD, the prognosis and treatment options. Pertinent laboratory and imaging test results that were available during this visit with the patient were reviewed by me and considered in my medical decision making (see chart for details).

## 2019-10-11 DIAGNOSIS — H524 Presbyopia: Secondary | ICD-10-CM | POA: Diagnosis not present

## 2019-10-11 DIAGNOSIS — Z0100 Encounter for examination of eyes and vision without abnormal findings: Secondary | ICD-10-CM | POA: Diagnosis not present

## 2019-10-26 DIAGNOSIS — E1169 Type 2 diabetes mellitus with other specified complication: Secondary | ICD-10-CM | POA: Diagnosis not present

## 2019-10-26 DIAGNOSIS — Z0000 Encounter for general adult medical examination without abnormal findings: Secondary | ICD-10-CM | POA: Diagnosis not present

## 2019-10-26 DIAGNOSIS — D649 Anemia, unspecified: Secondary | ICD-10-CM | POA: Diagnosis not present

## 2019-10-26 DIAGNOSIS — Z01818 Encounter for other preprocedural examination: Secondary | ICD-10-CM | POA: Diagnosis not present

## 2019-10-26 DIAGNOSIS — E119 Type 2 diabetes mellitus without complications: Secondary | ICD-10-CM | POA: Diagnosis not present

## 2019-10-26 DIAGNOSIS — D509 Iron deficiency anemia, unspecified: Secondary | ICD-10-CM | POA: Diagnosis not present

## 2019-10-26 DIAGNOSIS — E039 Hypothyroidism, unspecified: Secondary | ICD-10-CM | POA: Diagnosis not present

## 2019-10-26 DIAGNOSIS — E1122 Type 2 diabetes mellitus with diabetic chronic kidney disease: Secondary | ICD-10-CM | POA: Diagnosis not present

## 2019-10-26 DIAGNOSIS — E1165 Type 2 diabetes mellitus with hyperglycemia: Secondary | ICD-10-CM | POA: Diagnosis not present

## 2019-10-31 DIAGNOSIS — Z01818 Encounter for other preprocedural examination: Secondary | ICD-10-CM | POA: Diagnosis not present

## 2019-10-31 DIAGNOSIS — E1122 Type 2 diabetes mellitus with diabetic chronic kidney disease: Secondary | ICD-10-CM | POA: Diagnosis not present

## 2019-10-31 DIAGNOSIS — M1711 Unilateral primary osteoarthritis, right knee: Secondary | ICD-10-CM | POA: Diagnosis not present

## 2019-10-31 DIAGNOSIS — R251 Tremor, unspecified: Secondary | ICD-10-CM | POA: Diagnosis not present

## 2019-10-31 DIAGNOSIS — I10 Essential (primary) hypertension: Secondary | ICD-10-CM | POA: Diagnosis not present

## 2019-10-31 DIAGNOSIS — M109 Gout, unspecified: Secondary | ICD-10-CM | POA: Diagnosis not present

## 2019-10-31 DIAGNOSIS — E1165 Type 2 diabetes mellitus with hyperglycemia: Secondary | ICD-10-CM | POA: Diagnosis not present

## 2019-10-31 DIAGNOSIS — N1831 Chronic kidney disease, stage 3a: Secondary | ICD-10-CM | POA: Diagnosis not present

## 2019-10-31 DIAGNOSIS — D649 Anemia, unspecified: Secondary | ICD-10-CM | POA: Diagnosis not present

## 2019-10-31 DIAGNOSIS — Z0000 Encounter for general adult medical examination without abnormal findings: Secondary | ICD-10-CM | POA: Diagnosis not present

## 2019-10-31 DIAGNOSIS — Z0001 Encounter for general adult medical examination with abnormal findings: Secondary | ICD-10-CM | POA: Diagnosis not present

## 2019-10-31 DIAGNOSIS — E1169 Type 2 diabetes mellitus with other specified complication: Secondary | ICD-10-CM | POA: Diagnosis not present

## 2019-10-31 DIAGNOSIS — E119 Type 2 diabetes mellitus without complications: Secondary | ICD-10-CM | POA: Diagnosis not present

## 2019-10-31 DIAGNOSIS — D509 Iron deficiency anemia, unspecified: Secondary | ICD-10-CM | POA: Diagnosis not present

## 2019-10-31 DIAGNOSIS — E782 Mixed hyperlipidemia: Secondary | ICD-10-CM | POA: Diagnosis not present

## 2019-10-31 DIAGNOSIS — E6609 Other obesity due to excess calories: Secondary | ICD-10-CM | POA: Diagnosis not present

## 2019-10-31 DIAGNOSIS — I129 Hypertensive chronic kidney disease with stage 1 through stage 4 chronic kidney disease, or unspecified chronic kidney disease: Secondary | ICD-10-CM | POA: Diagnosis not present

## 2019-11-12 ENCOUNTER — Telehealth: Payer: Self-pay | Admitting: Neurology

## 2019-11-12 NOTE — Telephone Encounter (Signed)
Pt has called to report since her carbidopa-levodopa (SINEMET IR) 25-100 MG tablet has been increased she has noticed swelling in her feet.  Pt is asking for a call to discuss.

## 2019-11-12 NOTE — Telephone Encounter (Signed)
Dr. Frances Furbish is currently our of the office. Pt reports since increasing to 4 tablets of the sinemet 25-100 mg she has noticed an increase swelling in her feet.  Pt is wanting to know if her dosage should be adjusted due to this.  I willd fwd to the work in doctor during Dr. Teofilo Pod absence.

## 2019-11-13 NOTE — Telephone Encounter (Signed)
I reached out to the pt and advised of MD's recommendations. She will check in with PCP and call us back once she has their recommendations.

## 2019-11-13 NOTE — Telephone Encounter (Signed)
Dr. Vickey Huger- please advise. You are WID this am. Dr. Frances Furbish out of the office. Thank you

## 2019-11-13 NOTE — Telephone Encounter (Signed)
[name],   It is not likely that swelling is related to Sinemet- the more likely culprits are Neurontin, and Norvasc.   I would not adjust dopaminergic medication and instead consider reviewing these medications first with PCP.  Melvyn Novas, MD

## 2019-12-11 DIAGNOSIS — Z0001 Encounter for general adult medical examination with abnormal findings: Secondary | ICD-10-CM | POA: Diagnosis not present

## 2019-12-11 DIAGNOSIS — E782 Mixed hyperlipidemia: Secondary | ICD-10-CM | POA: Diagnosis not present

## 2019-12-11 DIAGNOSIS — I10 Essential (primary) hypertension: Secondary | ICD-10-CM | POA: Diagnosis not present

## 2019-12-11 DIAGNOSIS — E1165 Type 2 diabetes mellitus with hyperglycemia: Secondary | ICD-10-CM | POA: Diagnosis not present

## 2019-12-11 DIAGNOSIS — D509 Iron deficiency anemia, unspecified: Secondary | ICD-10-CM | POA: Diagnosis not present

## 2019-12-25 DIAGNOSIS — E119 Type 2 diabetes mellitus without complications: Secondary | ICD-10-CM | POA: Diagnosis not present

## 2019-12-25 DIAGNOSIS — B351 Tinea unguium: Secondary | ICD-10-CM | POA: Diagnosis not present

## 2020-01-15 DIAGNOSIS — I10 Essential (primary) hypertension: Secondary | ICD-10-CM | POA: Diagnosis not present

## 2020-01-15 DIAGNOSIS — E1165 Type 2 diabetes mellitus with hyperglycemia: Secondary | ICD-10-CM | POA: Diagnosis not present

## 2020-01-15 DIAGNOSIS — D509 Iron deficiency anemia, unspecified: Secondary | ICD-10-CM | POA: Diagnosis not present

## 2020-01-15 DIAGNOSIS — E782 Mixed hyperlipidemia: Secondary | ICD-10-CM | POA: Diagnosis not present

## 2020-01-16 ENCOUNTER — Encounter: Payer: Self-pay | Admitting: Family Medicine

## 2020-01-16 ENCOUNTER — Ambulatory Visit: Payer: Medicare HMO | Admitting: Family Medicine

## 2020-01-16 VITALS — BP 155/82 | HR 81 | Ht 62.0 in | Wt 172.0 lb

## 2020-01-16 DIAGNOSIS — G20 Parkinson's disease: Secondary | ICD-10-CM

## 2020-01-16 NOTE — Patient Instructions (Addendum)
We will continue Sinemet 1 tablet 4 times daily. (6am, 1am, 2pm,6pm)  Consider daily dose of gabapentin as recommended by PCP, could also consider 100mg  three times daily if needed. Please discuss with PCP. Consider alpha lipoic acid over the counter if ok with PCP.   Discuss referral to Urology with PCP.   Stay active as possible. Stay well hydrated. Well balanced diet.   Follow up in 3-4 months   Parkinson's Disease Parkinson's disease causes problems with movements. It is a long-term condition. It gets worse over time (is progressive). It affects each person in different ways. It makes it harder for you to:  Control how your body moves.  Move your body normally. The condition can range from mild to very bad (advanced). What are the causes? This condition results from a loss of brain cells called neurons. These brain cells make a chemical called dopamine, which is needed to control body movement. As the condition gets worse, the brain cells make less dopamine. This makes it hard to move or control your movements. The exact cause of this condition is not known. What increases the risk?  Being female.  Being age 40 or older.  Having family members who had Parkinson's disease.  Having had an injury to the brain.  Being very sad (depressed).  Being around things that are harmful or poisonous. What are the signs or symptoms? Symptoms of this condition can vary. The main symptoms have to do with movement. These include:  A tremor or shaking while you are resting that you cannot control.  Stiffness in your neck, arms, and legs.  Slowing of movement. This may include: ? Losing expressions of the face. ? Having trouble making small movements that are needed to button your clothing or brush your teeth.  Walking in a way that is not normal. You may walk with short, shuffling steps.  Loss of balance when standing. You may sway, fall backward, or have trouble making turns. Other  symptoms include:  Being very sad, worried, or confused.  Seeing or hearing things that are not real.  Losing thinking abilities (dementia).  Trouble speaking or swallowing.  Having a hard time pooping (constipation).  Needing to pee right away, peeing often, or not being able to control when you pee or poop.  Sleep problems. How is this treated? There is no cure. The goal of treatment is to manage your symptoms. Treatment may include:  Medicines.  Therapy to help with talking or movement.  Surgery to reduce shaking and other movements that you cannot control. Follow these instructions at home: Medicines  Take over-the-counter and prescription medicines only as told by your doctor.  Avoid taking pain or sleeping medicines. Eating and drinking  Follow instructions from your doctor about what you cannot eat or drink.  Do not drink alcohol. Activity  Talk with your doctor about if it is safe for you to drive.  Do exercises as told by your doctor. Lifestyle      Put in grab bars and railings in your home. These help to prevent falls.  Do not use any products that contain nicotine or tobacco, such as cigarettes, e-cigarettes, and chewing tobacco. If you need help quitting, ask your doctor.  Join a support group. General instructions  Talk with your doctor about what you need help with and what your safety needs are.  Keep all follow-up visits as told by your doctor, including any therapy visits to help with talking or moving. This is  important. Contact a doctor if:  Medicines do not help your symptoms.  You feel off-balance.  You fall at home.  You need more help at home.  You have trouble swallowing.  You have a very hard time pooping.  You have a lot of side effects from your medicines.  You feel very sad, worried, or confused. Get help right away if:  You were hurt in a fall.  You see or hear things that are not real.  You cannot swallow  without choking.  You have chest pain or trouble breathing.  You do not feel safe at home.  You have thoughts about hurting yourself or others. If you ever feel like you may hurt yourself or others, or have thoughts about taking your own life, get help right away. You can go to your nearest emergency department or call:  Your local emergency services (911 in the [place]  A suicide crisis helpline, such as the National Suicide Prevention Lifeline at 256-478-2676. This is open 24 hours a day. Summary  This condition causes problems with movements.  It is a long-term condition. It gets worse over time.  There is no cure. Treatment focuses on managing your symptoms.  Talk with your doctor about what you need help with and what your safety needs are.  Keep all follow-up visits as told by your doctor. This is important. This information is not intended to replace advice given to you by your health care provider. Make sure you discuss any questions you have with your health care provider. Document Revised: 08/10/2018 Document Reviewed: 08/10/2018 Elsevier Patient Education  2020 [employer].

## 2020-01-16 NOTE — Progress Notes (Addendum)
PATIENT: [name] DOB: 1941-04-25  REASON FOR VISIT: follow up HISTORY FROM: patient  Chief Complaint  Patient presents with  . Follow-up    rm 6 here for a f/u on parkinsons. Pt said she is having numbness in both feet.     HISTORY OF PRESENT ILLNESS: Today 01/16/20 [name] is a 79 y.o. female here today for follow up for PD. She is taking Sinemet 1 tablet four times daily. She feels that she is doing better. She is tolerating Sinemet. She is followed closely by PCP. She ia diabetic and reports bilateral feet numbness. She is taking gabapentin 32m PRN for knee pain but PCP recently suggested that she take gabapentin every day. She has not increased dosing. She does not grogginess with taking gabapentin. She continues to have difficulty with urinary frequency and incontinence. She has not seen urology. She continues to use a rolling walker. She lives with her husband. She does not drive. She helps with finances. She doses her own medications. She is able to perform ADL's independently. She denies memory concerns.   HISTORY: (copied from Dr AGuadelupe Sabinnote on 10/10/2019)  [name] a 79year old right-handed woman with an underlying complex medical history of hypertension, hyperlipidemia, diastolic dysfunction, chronic kidney disease, diverticulosis, diabetes, leg numbness, renal artery aneurysm, obesity, left ventricular hypertrophy, gout, arthritis, history of pneumonia, and history of migraines, who presents for FU consultation of her parkinsonism. The patient is unaccompanied today.  I first met her on 07/11/2019 at the request of her primary care provider, at which time she reported a several month history of hand tremors, right side more than left and also additional motor symptoms including slowness.  She had on examination findings in keeping with parkinsonism with right-sided lateralization noted.  Her history and exam were in keeping with right-sided Parkinson's disease  and she was advised to start Sinemet at low-dose with gradual titration.  Today, 10/10/2019: She reports that the Sinemet has helped her tremor.  She tolerates it.  She has noticed more bladder dyscontrol and urinary incontinence, some during the day and also at night.  She has never seen urology.  She denies any symptoms concerning for urinary tract infection.  She has an appointment for blood work and follow-up with her primary care physician later this month.  She has an appointment for her eye exam tomorrow.  She has taken the Covid vaccine, had some side effects after the second shot including feeling weaker and sore in the arm for about 24 hours but no lasting issues.  She denies constipation, sleeps well, mood is stable, no falls.  She uses a rolling walker.  She has residual right knee pain.  She had right knee replacement.  She also reports that she really needs a left knee replacement but is reluctant to pursue this.  She no longer takes propranolol.  The patient's allergies, current medications, family history, past medical history, past social history, past surgical history and problem list were reviewed and updated as appropriate.   Previously:   07/11/19: (She) reports a hand tremor for the past several months, less than a year. Started in her right hand. She has noticed slowness and difficulty with fine motor skills. She has had balance problems. She had a right total knee replacement in October 2020 and fell in December 2020. Thankfully, she did not injure her right knee but she had some pain. She was advised to start gabapentin. In the past, she had received  injection into her lower back for lower back pain but  has not had any recent pain in the lower back. She also reports that she may need a replacement of her left knee but currently does not have any significant left knee pain. She had been using a 2 wheeled walker prior to her fall and actually had graduated to a cane after  finishing therapy. After the fall she started using her rolling walker. She denies any significant constipation, she is not aware of any family history of Parkinson's disease. She denies any significant memory issues. 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 She is a non-smoker and does not drink any alcohol. She drinks caffeine and very little amounts. She reports some difficulty sleeping, she has nocturia and also reports incontinence at night and also during the day, wears depends. I reviewed your office note from 07/04/2019. She was noted to have bilateral hand tremors. She was advised to continue with the propranolol. She has been on it for a few months as I understand. She does not notice much in the way of benefit from it.   REVIEW OF SYSTEMS: Out of a complete 14 system review of symptoms, the patient complains only of the following symptoms, tremor, numbness of feet, knee pain and all other reviewed systems are negative.  ALLERGIES: No Known Allergies  HOME MEDICATIONS: Outpatient Medications Prior to Visit  Medication Sig Dispense Refill  . acetaminophen (TYLENOL) 500 MG tablet Take 500 mg by mouth every 6 (six) hours as needed for moderate pain.     Marland Kitchen amLODipine (NORVASC) 5 MG tablet Take 5 mg by mouth daily.     . carbidopa-levodopa (SINEMET IR) 25-100 MG tablet Take 1 tablet by mouth 4 (four) times daily. At 6 AM, 10 AM, 2 PM and 6 PM 360 tablet 3  . chlorthalidone (HYGROTON) 25 MG tablet Take 12.5 mg by mouth daily.    Marland Kitchen gabapentin (NEURONTIN) 300 MG capsule Take 1 capsule by mouth at bedtime.    . metFORMIN (GLUCOPHAGE) 1000 MG tablet Take 1,000 mg by mouth daily with breakfast.     . olmesartan (BENICAR) 20 MG tablet Take 20 mg by mouth daily.    . simvastatin (ZOCOR) 40 MG tablet Take 40 mg by mouth every evening.    . traMADol (ULTRAM) 50 MG tablet Take 1-2 tabs po q6hrs prn pain, start with lowest effective dose. 40  tablet 0   No facility-administered medications prior to visit.    PAST MEDICAL HISTORY: Past Medical History:  Diagnosis Date  . Aortic atherosclerosis ([place]) 09/11/2016   noted on CXR  . Arthritis   . Baker's cyst, left 2014   Small, Left  . Bilateral leg numbness   . Cataract    Bilateral  . Diabetes mellitus without complication ([place])   . Diverticulosis 10/09/2016   Noted on CT abd/pelvis  . Gout   . Grade I diastolic dysfunction 09/60/4540   Noted on ECHO  . 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 History of iron deficiency anemia   . History of migraine   . Hypercholesteremia   . Hypertension   . Internal hemorrhoids   . Lateral meniscal tear 2014   Left  . LVH (left ventricular hypertrophy) 03/03/2018   noted on EKG  . Obesity   . PMB (postmenopausal bleeding)   . Pneumonia 2018   Right upper lower  . PONV (  postoperative nausea and vomiting)   . 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 Thoracic aortic aneurysm ([place]) 12/09/2016   ascending 4.0 cm, noted on CT chest, pt unaware    PAST SURGICAL HISTORY: Past Surgical History:  Procedure Laterality Date  . ABDOMINAL HYSTERECTOMY    . CATARACT EXTRACTION Right 09/2010  . CATARACT EXTRACTION Left 10/2010  . COLONOSCOPY    . DILATION AND CURETTAGE OF UTERUS    . TOTAL KNEE ARTHROPLASTY Right 04/21/2018   Procedure: TOTAL KNEE ARTHROPLASTY;  Surgeon: Earlie Server, MD;  Location: [facility];  Service: Orthopedics;  Laterality: Right;  Adductor Block    FAMILY HISTORY: Family History  Problem Relation Age of Onset  . Kidney disease Mother   . Diabetes Mother   . Congestive Heart Failure Mother   . Cancer Father        unsure of type    SOCIAL HISTORY: Social History   Socioeconomic History  . Marital status: Married    Spouse name: Not on file  . Number of children: 2  . Years of  education: 25  . Highest education level: High school graduate  Occupational History  . Occupation: Retired  Tobacco Use  . Smoking status: Never Smoker  . Smokeless tobacco: Never Used  Vaping Use  . Vaping Use: Never used  Substance and Sexual Activity  . Alcohol use: No  . Drug use: No  . Sexual activity: Not on file  Other Topics Concern  . Not on file  Social History Narrative   Lives at home with her husband.   Right-handed.   1/4 cup coffee per day.   Social Determinants of Health   Financial Resource Strain:   . Difficulty of Paying Living Expenses:   Food Insecurity:   . Worried About Charity fundraiser in the Last Year:   . Arboriculturist in the Last Year:   Transportation Needs:   . Film/video editor (Medical):   Marland Kitchen Lack of Transportation (Non-Medical):   Physical Activity:   . Days of Exercise per Week:   . Minutes of Exercise per Session:   Stress:   . Feeling of Stress :   Social Connections:   . Frequency of Communication with Friends and Family:   . Frequency of Social Gatherings with Friends and Family:   . Attends Religious Services:   . Active Member of Clubs or Organizations:   . Attends Archivist Meetings:   Marland Kitchen Marital Status:   Intimate Partner Violence:   . Fear of Current or Ex-Partner:   . Emotionally Abused:   Marland Kitchen Physically Abused:   . Sexually Abused:       PHYSICAL EXAM  Vitals:   01/16/20 1301  BP: (!) 155/82  Pulse: 81  Weight: 172 lb (78 kg)  Height: 5' 2"  (1.575 m)   Body mass index is 31.46 kg/m.  Generalized: Well developed, in no acute distress  Cardiology: normal rate and rhythm, no murmur noted Respiratory: clear to auscultation bilaterally  Neurological examination  Mentation: Alert oriented to time, place, history taking. Follows all commands speech and language fluent Cranial nerve II-XII: Pupils were equal round reactive to light. Extraocular movements were full, visual field were full on  confrontational test. Facial sensation and strength were normal. Head turning and shoulder shrug  were normal and symmetric. Motor: The  motor testing reveals 4+ over 5 strength of all 4 extremities. Good symmetric motor tone is noted throughout. Right hand resting tremor noted, very slight left hand tremor. Bradykinesia noted with finger taps, bilateral cogwheel rigidity, worse on right.  Sensory: Sensory testing is intact to soft touch on all 4 extremities. No evidence of extinction is noted.  Coordination: Cerebellar testing reveals slow finger-nose-finger and heel-to-shin bilaterally.  Gait and station: Gait is stable with rolling walker. Tandem not attempted.  Reflexes: Deep tendon reflexes are symmetric and normal bilaterally.   DIAGNOSTIC DATA (LABS, IMAGING, TESTING) - I reviewed patient records, labs, notes, testing and imaging myself where available.  No flowsheet data found.   Lab Results  Component Value Date   WBC 9.9 04/24/2018   HGB 10.5 (L) 04/24/2018   HCT 32.5 (L) 04/24/2018   MCV 92.3 04/24/2018   PLT 178 04/24/2018      Component Value Date/Time   NA 141 04/23/2018 0340   K 3.8 04/23/2018 0340   CL 105 04/23/2018 0340   CO2 29 04/23/2018 0340   GLUCOSE 114 (H) 04/23/2018 0340   BUN 17 04/23/2018 0340   CREATININE 0.88 04/23/2018 0340   CALCIUM 8.4 (L) 04/23/2018 0340   PROT 7.2 03/03/2018 1028   ALBUMIN 3.9 03/03/2018 1028   AST 14 (L) 03/03/2018 1028   ALT 12 03/03/2018 1028   ALKPHOS 77 03/03/2018 1028   BILITOT 0.5 03/03/2018 1028   GFRNONAA >60 04/23/2018 0340   GFRAA >60 04/23/2018 0340   Lab Results  Component Value Date   CHOL 176 10/18/2008   HDL 39 (L) 10/18/2008   LDLCALC 107 (H) 10/18/2008   TRIG 149 10/18/2008   CHOLHDL 4.5 Ratio 10/18/2008   Lab Results  Component Value Date   HGBA1C 6.0 (H) 04/14/2018   No results found for: [id number] Lab Results  Component Value Date   TSH 0.492 10/06/2016       ASSESSMENT AND PLAN 79  y.o. year old female  has a past medical history of Aortic atherosclerosis ([place]) (09/11/2016), Arthritis, Baker's cyst, left (2014), Bilateral leg numbness, Cataract, Diabetes mellitus without complication ([place]), Diverticulosis (10/09/2016), Gout, Grade I diastolic dysfunction (37/85/8850), Hepatic cyst (10/09/2016), History of iron deficiency anemia, History of migraine, Hypercholesteremia, Hypertension, Internal hemorrhoids, Lateral meniscal tear (2014), LVH (left ventricular hypertrophy) (03/03/2018), Obesity, PMB (postmenopausal bleeding), Pneumonia (2018), PONV (postoperative nausea and vomiting), Renal arterial aneurysm ([place]) (12/09/2016), Renal cyst (10/09/2016), and Thoracic aortic aneurysm ([place]) (12/09/2016). here with     ICD-10-CM   1. Parkinson's disease ([place])  G20     [name] is doing well from a Parkinson's perspective. She is tolerating Sinemet 1 tablet 4 times daily and feels tremor has improved. She will continue current dosing at 6am, 10am, 2pm and 6pm. She will discuss trying alpha lipoic acid for suspected diabetic neuropathy. May consider NCS in the future. She will discuss gabapentin dosing with PCP/orthopedics. May be better tolerated at 165m TID. She will continue discussion of urinary frequency/incontinence with PCP. She was encouraged to stay well hydrated. She will focus on regular physical and mental activity. Safety precautions advised. She will follow up in 3-4 months. She verbalizes understanding and agreement with this plan.    No orders of the defined types were placed in this encounter.    No orders of the defined types were placed in this encounter.     I spent 30 minutes with the patient. 50% of this time was spent counseling and educating patient on plan  of care and medications.    Debbora Presto, FNP-C 01/16/2020, 2:04 PM [facility] [address], [place], [place] [address] [phone]  I reviewed the above note and documentation by  the Nurse Practitioner and agree with the history, exam, assessment and plan as outlined above. I was available for consultation. Star Age, MD, PhD [facility])

## 2020-03-04 DIAGNOSIS — E782 Mixed hyperlipidemia: Secondary | ICD-10-CM | POA: Diagnosis not present

## 2020-03-04 DIAGNOSIS — E790 Hyperuricemia without signs of inflammatory arthritis and tophaceous disease: Secondary | ICD-10-CM | POA: Diagnosis not present

## 2020-03-04 DIAGNOSIS — Z01818 Encounter for other preprocedural examination: Secondary | ICD-10-CM | POA: Diagnosis not present

## 2020-03-04 DIAGNOSIS — N1831 Chronic kidney disease, stage 3a: Secondary | ICD-10-CM | POA: Diagnosis not present

## 2020-03-04 DIAGNOSIS — R944 Abnormal results of kidney function studies: Secondary | ICD-10-CM | POA: Diagnosis not present

## 2020-03-04 DIAGNOSIS — D509 Iron deficiency anemia, unspecified: Secondary | ICD-10-CM | POA: Diagnosis not present

## 2020-03-04 DIAGNOSIS — I10 Essential (primary) hypertension: Secondary | ICD-10-CM | POA: Diagnosis not present

## 2020-03-04 DIAGNOSIS — D649 Anemia, unspecified: Secondary | ICD-10-CM | POA: Diagnosis not present

## 2020-03-04 DIAGNOSIS — Z0000 Encounter for general adult medical examination without abnormal findings: Secondary | ICD-10-CM | POA: Diagnosis not present

## 2020-03-04 DIAGNOSIS — Z0001 Encounter for general adult medical examination with abnormal findings: Secondary | ICD-10-CM | POA: Diagnosis not present

## 2020-03-06 DIAGNOSIS — I10 Essential (primary) hypertension: Secondary | ICD-10-CM | POA: Diagnosis not present

## 2020-03-06 DIAGNOSIS — E1165 Type 2 diabetes mellitus with hyperglycemia: Secondary | ICD-10-CM | POA: Diagnosis not present

## 2020-03-06 DIAGNOSIS — E782 Mixed hyperlipidemia: Secondary | ICD-10-CM | POA: Diagnosis not present

## 2020-03-06 DIAGNOSIS — D509 Iron deficiency anemia, unspecified: Secondary | ICD-10-CM | POA: Diagnosis not present

## 2020-03-07 DIAGNOSIS — D509 Iron deficiency anemia, unspecified: Secondary | ICD-10-CM | POA: Diagnosis not present

## 2020-03-07 DIAGNOSIS — R251 Tremor, unspecified: Secondary | ICD-10-CM | POA: Diagnosis not present

## 2020-03-07 DIAGNOSIS — M1711 Unilateral primary osteoarthritis, right knee: Secondary | ICD-10-CM | POA: Diagnosis not present

## 2020-03-07 DIAGNOSIS — E1122 Type 2 diabetes mellitus with diabetic chronic kidney disease: Secondary | ICD-10-CM | POA: Diagnosis not present

## 2020-03-07 DIAGNOSIS — E782 Mixed hyperlipidemia: Secondary | ICD-10-CM | POA: Diagnosis not present

## 2020-03-07 DIAGNOSIS — M109 Gout, unspecified: Secondary | ICD-10-CM | POA: Diagnosis not present

## 2020-03-07 DIAGNOSIS — E6609 Other obesity due to excess calories: Secondary | ICD-10-CM | POA: Diagnosis not present

## 2020-03-07 DIAGNOSIS — I129 Hypertensive chronic kidney disease with stage 1 through stage 4 chronic kidney disease, or unspecified chronic kidney disease: Secondary | ICD-10-CM | POA: Diagnosis not present

## 2020-03-07 DIAGNOSIS — Z23 Encounter for immunization: Secondary | ICD-10-CM | POA: Diagnosis not present

## 2020-03-13 ENCOUNTER — Other Ambulatory Visit: Payer: Self-pay | Admitting: Internal Medicine

## 2020-03-13 ENCOUNTER — Other Ambulatory Visit (HOSPITAL_COMMUNITY): Payer: Self-pay | Admitting: Internal Medicine

## 2020-03-13 DIAGNOSIS — R946 Abnormal results of thyroid function studies: Secondary | ICD-10-CM

## 2020-03-21 ENCOUNTER — Other Ambulatory Visit: Payer: Self-pay

## 2020-03-21 ENCOUNTER — Ambulatory Visit (HOSPITAL_COMMUNITY)
Admission: RE | Admit: 2020-03-21 | Discharge: 2020-03-21 | Disposition: A | Discharge status: Home / Self Care | Payer: Medicare HMO | Source: Ambulatory Visit | Attending: Internal Medicine | Admitting: Internal Medicine

## 2020-03-21 DIAGNOSIS — R946 Abnormal results of thyroid function studies: Secondary | ICD-10-CM | POA: Diagnosis not present

## 2020-03-21 DIAGNOSIS — E041 Nontoxic single thyroid nodule: Secondary | ICD-10-CM | POA: Diagnosis not present

## 2020-04-18 DIAGNOSIS — E1165 Type 2 diabetes mellitus with hyperglycemia: Secondary | ICD-10-CM | POA: Diagnosis not present

## 2020-04-18 DIAGNOSIS — I10 Essential (primary) hypertension: Secondary | ICD-10-CM | POA: Diagnosis not present

## 2020-04-18 DIAGNOSIS — E782 Mixed hyperlipidemia: Secondary | ICD-10-CM | POA: Diagnosis not present

## 2020-04-18 DIAGNOSIS — D509 Iron deficiency anemia, unspecified: Secondary | ICD-10-CM | POA: Diagnosis not present

## 2020-04-21 ENCOUNTER — Telehealth: Payer: Self-pay

## 2020-04-21 ENCOUNTER — Ambulatory Visit: Payer: Medicare HMO | Admitting: Neurology

## 2020-04-21 ENCOUNTER — Encounter: Payer: Self-pay | Admitting: Neurology

## 2020-04-21 NOTE — Telephone Encounter (Signed)
Pt did not show for their appt with Dr. Athar today.  

## 2020-05-05 DIAGNOSIS — M542 Cervicalgia: Secondary | ICD-10-CM | POA: Diagnosis not present

## 2020-05-05 DIAGNOSIS — M25512 Pain in left shoulder: Secondary | ICD-10-CM | POA: Diagnosis not present

## 2020-05-06 ENCOUNTER — Ambulatory Visit: Payer: Medicare HMO | Admitting: Neurology

## 2020-05-15 ENCOUNTER — Encounter: Payer: Self-pay | Admitting: Neurology

## 2020-05-15 ENCOUNTER — Ambulatory Visit: Payer: Medicare HMO | Admitting: Neurology

## 2020-05-15 VITALS — BP 161/86 | HR 71 | Ht 62.0 in | Wt 172.0 lb

## 2020-05-15 DIAGNOSIS — G20 Parkinson's disease: Secondary | ICD-10-CM

## 2020-05-15 NOTE — Progress Notes (Signed)
Subjective:    Patient ID: [name] is a 79 y.o. female.  HPI     Interim history:   [name] is a 79 year old right-handed woman with an underlying complex medical history of hypertension, hyperlipidemia, diastolic dysfunction, chronic kidney disease, diverticulosis, diabetes, leg numbness, renal artery aneurysm, obesity, left ventricular hypertrophy, gout, arthritis, history of pneumonia, and history of migraines, who presents for FU consultation of her parkinsonism. The patient is unaccompanied today. I last saw her on 10/10/2019, at which time she reported that the Sinemet was helpful for her tremor.  She had noticed no bladder dyscontrol and urinary incontinence.  She had not seen urology and was encouraged to talk to her primary care about a referral to see urology.  She had residual right knee pain and also reported left knee pain.  I suggested she increase her Sinemet to 1 pill 4 times daily.  She called in the interim reporting in June that she felt her swelling became worse after she increase the Sinemet but she was encouraged to follow-up with her primary care regarding the lower extremity swelling as Sinemet is not typically the cause for lower extremity swelling.  She saw Debbora Presto, nurse practitioner in the interim on 01/16/2020, at which time she was stable on Sinemet 1 pill 4 times a day.  She was reporting numbness in both feet.  She was taking gabapentin 300 mg strength as needed for her knee pain primarily.  Today, 05/15/2020: She reports feeling stable, she continues to have some neuropathy in the toes.  She has not fallen, she uses a walker inside and outside.  She tries to exercise in the form of walking and she also has a pedaling device.  She tries to hydrate well, she drinks plenty of water with her medications and also in between.  She is not currently suffer from any significant constipation.  Her blood sugar control is good by her recollection and her A1c was less  than seven as far she knows.  She has regular checkup with her primary care physician or nurse practitioner.  She takes her Sinemet four times a day on schedule, typically four hourly starting at 6 AM.  She had recent flare of the neck pain.  She was given a steroid course for 4 days through her primary care and lidocaine patches and feels improved, she no longer uses the lidocaine patches.   The patient's allergies, current medications, family history, past medical history, past social history, past surgical history and problem list were reviewed and updated as appropriate.    Previously:      I first met her on 07/11/2019 at the request of her primary care provider, at which time she reported a several month history of hand tremors, right side more than left and also additional motor symptoms including slowness.  She had on examination findings in keeping with parkinsonism with right-sided lateralization noted.  Her history and exam were in keeping with right-sided Parkinson's disease and she was advised to start Sinemet at low-dose with gradual titration.     07/11/19: (She) reports a hand tremor for the past several months, less than a year.  Started in her right hand.  She has noticed slowness and difficulty with fine motor skills.  She has had balance problems.  She had a right total knee replacement in October 2020 and fell in December 2020.  Thankfully, she did not injure her right knee but she had some pain.  She was advised  to start gabapentin.  In the past, she had received injection into her lower back for lower back pain but  has not had any recent pain in the lower back.  She also reports that she may need a replacement of her left knee but currently does not have any significant left knee pain.  She had been using a 2 wheeled walker prior to her fall and actually had graduated to a cane after finishing therapy.  After the fall she started using her rolling walker.  She denies any significant  constipation, she is not aware of any family history of Parkinson's disease.  She denies any significant memory issues.  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  She is a non-smoker and does not drink any alcohol.  She drinks caffeine and very little amounts.  She reports some difficulty sleeping, she has nocturia and also reports incontinence at night and also during the day, wears depends.  I reviewed your office note from 07/04/2019.  She was noted to have bilateral hand tremors.  She was advised to continue with the propranolol.  She has been on it for a few months as I understand.  She does not notice much in the way of benefit from it.  Her Past Medical History Is Significant For: Past Medical History:  Diagnosis Date  . Aortic atherosclerosis ([place]) 09/11/2016   noted on CXR  . Arthritis   . Baker's cyst, left 2014   Small, Left  . Bilateral leg numbness   . Cataract    Bilateral  . Diabetes mellitus without complication ([place])   . Diverticulosis 10/09/2016   Noted on CT abd/pelvis  . Gout   . Grade I diastolic dysfunction 00/17/4944   Noted on ECHO  . 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 History of iron deficiency anemia   . History of migraine   . Hypercholesteremia   . Hypertension   . Internal hemorrhoids   . Lateral meniscal tear 2014   Left  . LVH (left ventricular hypertrophy) 03/03/2018   noted on EKG  . Obesity   . PMB (postmenopausal bleeding)   . Pneumonia 2018   Right upper lower  . PONV (postoperative nausea and vomiting)   . 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 Thoracic aortic aneurysm ([place]) 12/09/2016   ascending 4.0 cm, noted on CT chest, pt unaware    Her Past Surgical History Is Significant For: Past Surgical History:  Procedure Laterality Date  . ABDOMINAL HYSTERECTOMY    .  CATARACT EXTRACTION Right 09/2010  . CATARACT EXTRACTION Left 10/2010  . COLONOSCOPY    . DILATION AND CURETTAGE OF UTERUS    . TOTAL KNEE ARTHROPLASTY Right 04/21/2018   Procedure: TOTAL KNEE ARTHROPLASTY;  Surgeon: Earlie Server, MD;  Location: [facility];  Service: Orthopedics;  Laterality: Right;  Adductor Block    Her Family History Is Significant For: Family History  Problem Relation Age of Onset  . Kidney disease Mother   . Diabetes Mother   . Congestive Heart Failure Mother   . Cancer Father        unsure of type    Her Social History Is Significant For: Social History   Socioeconomic History  . Marital status: Married    Spouse name:  Not on file  . Number of children: 2  . Years of education: 53  . Highest education level: High school graduate  Occupational History  . Occupation: Retired  Tobacco Use  . Smoking status: Never Smoker  . Smokeless tobacco: Never Used  Vaping Use  . Vaping Use: Never used  Substance and Sexual Activity  . Alcohol use: No  . Drug use: No  . Sexual activity: Not on file  Other Topics Concern  . Not on file  Social History Narrative   Lives at home with her husband.   Right-handed.   1/4 cup coffee per day.   Social Determinants of Health   Financial Resource Strain: Not on file  Food Insecurity: Not on file  Transportation Needs: Not on file  Physical Activity: Not on file  Stress: Not on file  Social Connections: Not on file    Her Allergies Are:  No Known Allergies:   Her Current Medications Are:  Outpatient Encounter Medications as of 05/15/2020  Medication Sig  . acetaminophen (TYLENOL) 500 MG tablet Take 500 mg by mouth every 6 (six) hours as needed for moderate pain.   Marland Kitchen amLODipine (NORVASC) 5 MG tablet Take 5 mg by mouth daily.   . carbidopa-levodopa (SINEMET IR) 25-100 MG tablet Take 1 tablet by mouth 4 (four) times daily. At 6 AM, 10 AM, 2 PM and 6 PM  . chlorthalidone (HYGROTON) 25 MG tablet Take 12.5 mg by  mouth daily.  Marland Kitchen gabapentin (NEURONTIN) 300 MG capsule Take 1 capsule by mouth at bedtime.  . metFORMIN (GLUCOPHAGE) 1000 MG tablet Take 1,000 mg by mouth daily with breakfast.   . olmesartan (BENICAR) 20 MG tablet Take 20 mg by mouth daily.  . simvastatin (ZOCOR) 40 MG tablet Take 40 mg by mouth every evening.  . traMADol (ULTRAM) 50 MG tablet Take 1-2 tabs po q6hrs prn pain, start with lowest effective dose.   No facility-administered encounter medications on file as of 05/15/2020.  :  Review of Systems:  Out of a complete 14 point review of systems, all are reviewed and negative with the exception of these symptoms as listed below: Review of Systems  Neurological:       Rm 1, 4 month FU Parkinson's, my legs are weak, toes feel numb, woke one morning with pain in my neck and PCP sent dose pack which gave relief.     Objective:  Neurological Exam  Physical Exam Physical Examination:   Vitals:   05/15/20 1335 05/15/20 1344  BP: (!) 170/89 (!) 161/86  Pulse: 73 71    General Examination: The patient is a very pleasant 79 y.o. female in no acute distress. She appears well-developed and well-nourished and well groomed.   HEENT:Normocephalic, atraumatic, pupils are equal, round and reactive to light.Extraocular tracking is mildly impaired, she has mild to moderate facial masking, she has jaw tremor. Corrective eyeglasses in place.  She has moderate nuchal rigidity. She has intact hearing. Airway examination reveals mild mouth dryness, otherwise nonfocal findings, tongue protrudes centrally and palate elevates symmetrically.  Mild hypophonia noted, no voice tremor, no dysarthria.  Chest:Clear to auscultation without wheezing, rhonchi or crackles noted.  Heart:S1+S2+0, regular and normal without murmurs, rubs or gallops noted.   Abdomen:Soft, non-tender and non-distended with normal bowel sounds appreciated on auscultation.  Extremities:There isb/l 1+ pitting edema in the  ankles, L more than R.   Skin: Warm and dry without trophic changes noted.  Musculoskeletal: exam reveals no obvious joint deformities,  tenderness or joint swelling or erythema.  Neurologically:  Mental status: The patient is awake, alert and oriented in all 4 spheres.Herimmediate and remote memory, attention, language skills and fund of knowledge are Fairly appropriate, she is somewhat slow in her thinking and in her responses. She does have hypophonia, no obvious dysarthria. Mood and affect are normal. Cranial nerves II - XII are as described above under HEENT exam.  Motor exam: Normal bulk,Global strength of 4+ out of 5, she has a mild to moderate resting tremor in the right upper extremity, mild in the left upper extremity, no lower extremity resting tremor. She has a mild postural tremor, minimal action tremor.  Romberg is not tested due to safety concerns.  She has moderate difficulty with fine motor skills on the right upper and lower extremities, mildly to moderately impaired on the left side.  She stands up with difficulty, takes 2 attempts and pushes herself up but requires no assistance. She uses her rolling walker slowly and cautiously. Balance is mildly impaired.  Assessmentand Plan:   In summary,Melanie Y Gravesis a very pleasant 29 year oldfemalewith an underlying complex medical history of hypertension, hyperlipidemia, diastolic dysfunction, chronic kidney disease, diverticulosis, diabetes, leg numbness, renal artery aneurysm, obesity, left ventricular hypertrophy, gout, arthritis, history of pneumonia, and history of migraines, who presents for follow-up consultation of her parkinsonism with history and exam in keeping with right-sided predominant Parkinson's disease. She has had response to levodopa therapy, has been able to tolerate Sinemet, which we increased from 1 pill 3 times daily to 1 pill 4 times daily in May 2021.  She does not currently have any significant  problems with constipation but has had ongoing issues with bladder control.  Thankfully, she has not fallen, she is advised to stay well-hydrated, monitor for constipation issues and use her walker at all times.  She is advised to continue with Sinemet 1 pill 4 times daily.  She is advised to follow-up routinely to see Debbora Presto, nurse practitioner in 6 months, sooner if needed.  I answered all her questions today and she was in agreement. I spent 20 minutes in total face-to-face time and in reviewing records during pre-charting, more than 50% of which was spent in counseling and coordination of care, reviewing test results, reviewing medications and treatment regimen and/or in discussing or reviewing the diagnosis of PD, the prognosis and treatment options. Pertinent laboratory and imaging test results that were available during this visit with the patient were reviewed by me and considered in my medical decision making (see chart for details).

## 2020-05-15 NOTE — Patient Instructions (Signed)
It was good to see you again today.  Please try to continue to hydrate well with water and exercise within your limitations on a daily basis, use your walker at all times for gait safety, be proactive about constipation issues.  Your exam is stable and you can continue with generic Sinemet 1 pill four times a day.  Your prescription is up-to-date, please follow-up to see Shawnie Dapper, nurse practitioner routinely in 6 months, sooner if needed.

## 2020-06-06 DIAGNOSIS — E1165 Type 2 diabetes mellitus with hyperglycemia: Secondary | ICD-10-CM | POA: Diagnosis not present

## 2020-06-06 DIAGNOSIS — D509 Iron deficiency anemia, unspecified: Secondary | ICD-10-CM | POA: Diagnosis not present

## 2020-06-06 DIAGNOSIS — I10 Essential (primary) hypertension: Secondary | ICD-10-CM | POA: Diagnosis not present

## 2020-06-06 DIAGNOSIS — E782 Mixed hyperlipidemia: Secondary | ICD-10-CM | POA: Diagnosis not present

## 2020-09-03 DIAGNOSIS — E1165 Type 2 diabetes mellitus with hyperglycemia: Secondary | ICD-10-CM | POA: Diagnosis not present

## 2020-09-03 DIAGNOSIS — I10 Essential (primary) hypertension: Secondary | ICD-10-CM | POA: Diagnosis not present

## 2020-09-03 DIAGNOSIS — E782 Mixed hyperlipidemia: Secondary | ICD-10-CM | POA: Diagnosis not present

## 2020-09-03 DIAGNOSIS — D509 Iron deficiency anemia, unspecified: Secondary | ICD-10-CM | POA: Diagnosis not present

## 2020-10-16 DIAGNOSIS — M1990 Unspecified osteoarthritis, unspecified site: Secondary | ICD-10-CM | POA: Diagnosis not present

## 2020-10-16 DIAGNOSIS — E1165 Type 2 diabetes mellitus with hyperglycemia: Secondary | ICD-10-CM | POA: Diagnosis not present

## 2020-10-21 DIAGNOSIS — D509 Iron deficiency anemia, unspecified: Secondary | ICD-10-CM | POA: Diagnosis not present

## 2020-10-21 DIAGNOSIS — M1990 Unspecified osteoarthritis, unspecified site: Secondary | ICD-10-CM | POA: Diagnosis not present

## 2020-10-21 DIAGNOSIS — E875 Hyperkalemia: Secondary | ICD-10-CM | POA: Diagnosis not present

## 2020-10-21 DIAGNOSIS — I10 Essential (primary) hypertension: Secondary | ICD-10-CM | POA: Diagnosis not present

## 2020-10-21 DIAGNOSIS — E782 Mixed hyperlipidemia: Secondary | ICD-10-CM | POA: Diagnosis not present

## 2020-10-21 DIAGNOSIS — E1165 Type 2 diabetes mellitus with hyperglycemia: Secondary | ICD-10-CM | POA: Diagnosis not present

## 2020-10-24 DIAGNOSIS — M1711 Unilateral primary osteoarthritis, right knee: Secondary | ICD-10-CM | POA: Diagnosis not present

## 2020-10-24 DIAGNOSIS — E782 Mixed hyperlipidemia: Secondary | ICD-10-CM | POA: Diagnosis not present

## 2020-10-24 DIAGNOSIS — G20 Parkinson's disease: Secondary | ICD-10-CM | POA: Diagnosis not present

## 2020-10-24 DIAGNOSIS — M109 Gout, unspecified: Secondary | ICD-10-CM | POA: Diagnosis not present

## 2020-10-24 DIAGNOSIS — E1122 Type 2 diabetes mellitus with diabetic chronic kidney disease: Secondary | ICD-10-CM | POA: Diagnosis not present

## 2020-10-24 DIAGNOSIS — N1831 Chronic kidney disease, stage 3a: Secondary | ICD-10-CM | POA: Diagnosis not present

## 2020-10-24 DIAGNOSIS — R946 Abnormal results of thyroid function studies: Secondary | ICD-10-CM | POA: Diagnosis not present

## 2020-10-24 DIAGNOSIS — D509 Iron deficiency anemia, unspecified: Secondary | ICD-10-CM | POA: Diagnosis not present

## 2020-10-24 DIAGNOSIS — I129 Hypertensive chronic kidney disease with stage 1 through stage 4 chronic kidney disease, or unspecified chronic kidney disease: Secondary | ICD-10-CM | POA: Diagnosis not present

## 2020-11-11 DIAGNOSIS — U071 COVID-19: Secondary | ICD-10-CM | POA: Diagnosis not present

## 2020-11-17 ENCOUNTER — Ambulatory Visit: Payer: Medicare HMO | Admitting: Family Medicine

## 2020-11-29 ENCOUNTER — Other Ambulatory Visit: Payer: Self-pay | Admitting: Neurology

## 2020-12-01 ENCOUNTER — Telehealth: Payer: Self-pay | Admitting: Neurology

## 2020-12-01 MED ORDER — CARBIDOPA-LEVODOPA 25-100 MG PO TABS: ORAL_TABLET | ORAL | 2 refills | Status: DC

## 2020-12-01 MED ORDER — CARBIDOPA-LEVODOPA 25-100 MG PO TABS: 1.0000 | ORAL_TABLET | Freq: Four times a day (QID) | ORAL | 0 refills | Status: DC

## 2020-12-01 NOTE — Telephone Encounter (Signed)
I called pt. She requested a prescription for carb/levo 25-100 be [facility] local [facility].  Pt has been compliant with f/u appt. Refills sent.

## 2020-12-01 NOTE — Telephone Encounter (Signed)
Pt called, can you send a 2 wks supply of carbidopa-levodopa (SINEMET IR) 25-100 MG tablet to [facility], [facility]. until I receive my refill from [facility]. Would like a call from the nurse.

## 2020-12-04 DIAGNOSIS — M1990 Unspecified osteoarthritis, unspecified site: Secondary | ICD-10-CM | POA: Diagnosis not present

## 2020-12-04 DIAGNOSIS — E1165 Type 2 diabetes mellitus with hyperglycemia: Secondary | ICD-10-CM | POA: Diagnosis not present

## 2020-12-24 DIAGNOSIS — H524 Presbyopia: Secondary | ICD-10-CM | POA: Diagnosis not present

## 2021-01-26 DIAGNOSIS — D509 Iron deficiency anemia, unspecified: Secondary | ICD-10-CM | POA: Diagnosis not present

## 2021-01-26 DIAGNOSIS — M150 Primary generalized (osteo)arthritis: Secondary | ICD-10-CM | POA: Diagnosis not present

## 2021-01-26 DIAGNOSIS — I10 Essential (primary) hypertension: Secondary | ICD-10-CM | POA: Diagnosis not present

## 2021-01-26 DIAGNOSIS — E1165 Type 2 diabetes mellitus with hyperglycemia: Secondary | ICD-10-CM | POA: Diagnosis not present

## 2021-01-26 DIAGNOSIS — E782 Mixed hyperlipidemia: Secondary | ICD-10-CM | POA: Diagnosis not present

## 2021-01-26 DIAGNOSIS — M1990 Unspecified osteoarthritis, unspecified site: Secondary | ICD-10-CM | POA: Diagnosis not present

## 2021-01-28 DIAGNOSIS — Z0001 Encounter for general adult medical examination with abnormal findings: Secondary | ICD-10-CM | POA: Diagnosis not present

## 2021-01-28 DIAGNOSIS — E782 Mixed hyperlipidemia: Secondary | ICD-10-CM | POA: Diagnosis not present

## 2021-01-28 DIAGNOSIS — D509 Iron deficiency anemia, unspecified: Secondary | ICD-10-CM | POA: Diagnosis not present

## 2021-01-28 DIAGNOSIS — E1122 Type 2 diabetes mellitus with diabetic chronic kidney disease: Secondary | ICD-10-CM | POA: Diagnosis not present

## 2021-01-28 DIAGNOSIS — N1831 Chronic kidney disease, stage 3a: Secondary | ICD-10-CM | POA: Diagnosis not present

## 2021-01-28 DIAGNOSIS — G20 Parkinson's disease: Secondary | ICD-10-CM | POA: Diagnosis not present

## 2021-01-28 DIAGNOSIS — M109 Gout, unspecified: Secondary | ICD-10-CM | POA: Diagnosis not present

## 2021-01-28 DIAGNOSIS — I129 Hypertensive chronic kidney disease with stage 1 through stage 4 chronic kidney disease, or unspecified chronic kidney disease: Secondary | ICD-10-CM | POA: Diagnosis not present

## 2021-01-28 DIAGNOSIS — M1711 Unilateral primary osteoarthritis, right knee: Secondary | ICD-10-CM | POA: Diagnosis not present

## 2021-02-04 DIAGNOSIS — M1990 Unspecified osteoarthritis, unspecified site: Secondary | ICD-10-CM | POA: Diagnosis not present

## 2021-02-04 DIAGNOSIS — E1165 Type 2 diabetes mellitus with hyperglycemia: Secondary | ICD-10-CM | POA: Diagnosis not present

## 2021-02-26 ENCOUNTER — Encounter: Payer: Self-pay | Admitting: Family Medicine

## 2021-02-26 ENCOUNTER — Ambulatory Visit: Payer: Medicare HMO | Admitting: Family Medicine

## 2021-02-26 ENCOUNTER — Other Ambulatory Visit: Payer: Self-pay

## 2021-02-26 VITALS — BP 158/88 | HR 77 | Ht 62.0 in | Wt 164.0 lb

## 2021-02-26 DIAGNOSIS — R4781 Slurred speech: Secondary | ICD-10-CM | POA: Diagnosis not present

## 2021-02-26 DIAGNOSIS — G20 Parkinson's disease: Secondary | ICD-10-CM

## 2021-02-26 DIAGNOSIS — R1310 Dysphagia, unspecified: Secondary | ICD-10-CM

## 2021-02-26 NOTE — Patient Instructions (Signed)
Below is our plan:  We will continue Sinemet 1 tablet four times daily. I will send you for a swallowing evaluation and ask speech therapy to evaluate your concerns of slurred speech. Please let me know if you have not heard back regarding this in 2 weeks.   Please make sure you are staying well hydrated. I recommend 50-60 ounces daily. Well balanced diet and regular exercise encouraged. Consistent sleep schedule with 6-8 hours recommended.   Please continue follow up with care team as directed.   Follow up with Dr Frances Furbish in 3-4 months  You may receive a survey regarding today's visit. I encourage you to leave honest feed back as I do use this information to improve patient care. Thank you for seeing me today!

## 2021-02-26 NOTE — Progress Notes (Signed)
PATIENT: [name] DOB: 21-Aug-1940  REASON FOR VISIT: follow up HISTORY FROM: patient  Chief Complaint  Patient presents with   Follow-up    Pt alone, rm 11. Pt presents today for Parkinson's follow up. Overall stable. Same issues/concerns.       HISTORY OF PRESENT ILLNESS: 02/26/21 ALL: [name] returns for follow up for PD. She continues generic Sinemet 1 tablet QID. She feels that she is doing fairly well. Right arm tremor is improved. She notices the tremor from time to time but states it is much better and not bothersome. She denies changes in gait. No falls. She uses her Rolator at all times. She is sleeping well. Memory is good. She does have concerns of difficulty swallowing and feels that her speech has changed. She reports getting "choked up" nearly every day. Usually only when drinking water. She is able to cough a time or two and then feels fine. She feels that her speech is more slurred than it used to be. She does not have difficulty with conversation but feels that her speech sounds slurred. She does not notice any improvement or changes following Sinemet dosing. She continues follow up with PCP regularly. She reports neck pain is stable.   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  She has not fallen, she uses a walker inside and outside.  She tries to exercise in the form of walking and she also has a pedaling device.  She tries to hydrate well, she drinks plenty of water with her medications and also in between.  She is not currently suffer from any significant constipation.  Her blood sugar control is good by her recollection and her A1c was less than seven as far she knows.  She has regular checkup with her primary care physician or nurse practitioner.  She takes her Sinemet four times a day on schedule, typically four hourly starting at 6 AM.  She had recent flare of the neck pain.  She was given a steroid course for 4  days through her primary care and lidocaine patches and feels improved, she no longer uses the lidocaine patches.  01/16/2020 ALL:  [name] is a 80 y.o. female here today for follow up for PD. She is taking Sinemet 1 tablet four times daily. She feels that she is doing better. She is tolerating Sinemet. She is followed closely by PCP. She ia diabetic and reports bilateral feet numbness. She is taking gabapentin 312m PRN for knee pain but PCP recently suggested that she take gabapentin every day. She has not increased dosing. She does not grogginess with taking gabapentin. She continues to have difficulty with urinary frequency and incontinence. She has not seen urology. She continues to use a rolling walker. She lives with her husband. She does not drive. She helps with finances. She doses her own medications. She is able to perform ADL's independently. She denies memory concerns.   HISTORY: (copied from Dr AGuadelupe Sabinnote on 10/10/2019)  [name] a 80year old right-handed woman with an underlying complex medical history of hypertension, hyperlipidemia, diastolic dysfunction, chronic kidney disease, diverticulosis, diabetes, leg numbness, renal artery aneurysm, obesity, left ventricular hypertrophy, gout, arthritis, history of pneumonia, and history of migraines, who presents for FU consultation of her parkinsonism. The patient is unaccompanied today.  I first met her on 07/11/2019 at the request of her primary care provider, at which time she reported a several month history  of hand tremors, right side more than left and also additional motor symptoms including slowness.  She had on examination findings in keeping with parkinsonism with right-sided lateralization noted.  Her history and exam were in keeping with right-sided Parkinson's disease and she was advised to start Sinemet at low-dose with gradual titration.   Today, 10/10/2019: She reports that the Sinemet has helped her tremor.  She tolerates  it.  She has noticed more bladder dyscontrol and urinary incontinence, some during the day and also at night.  She has never seen urology.  She denies any symptoms concerning for urinary tract infection.  She has an appointment for blood work and follow-up with her primary care physician later this month.  She has an appointment for her eye exam tomorrow.  She has taken the Covid vaccine, had some side effects after the second shot including feeling weaker and sore in the arm for about 24 hours but no lasting issues.  She denies constipation, sleeps well, mood is stable, no falls.  She uses a rolling walker.  She has residual right knee pain.  She had right knee replacement.  She also reports that she really needs a left knee replacement but is reluctant to pursue this.  She no longer takes propranolol.   The patient's allergies, current medications, family history, past medical history, past social history, past surgical history and problem list were reviewed and updated as appropriate.    Previously:    07/11/19: (She) reports a hand tremor for the past several months, less than a year.  Started in her right hand.  She has noticed slowness and difficulty with fine motor skills.  She has had balance problems.  She had a right total knee replacement in October 2020 and fell in December 2020.  Thankfully, she did not injure her right knee but she had some pain.  She was advised to start gabapentin.  In the past, she had received injection into her lower back for lower back pain but  has not had any recent pain in the lower back.  She also reports that she may need a replacement of her left knee but currently does not have any significant left knee pain.  She had been using a 2 wheeled walker prior to her fall and actually had graduated to a cane after finishing therapy.  After the fall she started using her rolling walker.  She denies any significant constipation, she is not aware of any family history of  Parkinson's disease.  She denies any significant memory issues.  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  She is a non-smoker and does not drink any alcohol.  She drinks caffeine and very little amounts.  She reports some difficulty sleeping, she has nocturia and also reports incontinence at night and also during the day, wears depends.  I reviewed your office note from 07/04/2019.  She was noted to have bilateral hand tremors.  She was advised to continue with the propranolol.  She has been on it for a few months as I understand.  She does not notice much in the way of benefit from it.   REVIEW OF SYSTEMS: Out of a complete 14 system review of symptoms, the patient complains only of the following symptoms, difficulty swallowing, slurred speech, tremor, numbness of feet, knee pain and all other reviewed systems are negative.  ALLERGIES: No Known Allergies  HOME MEDICATIONS: Outpatient Medications Prior  to Visit  Medication Sig Dispense Refill   acetaminophen (TYLENOL) 500 MG tablet Take 500 mg by mouth every 6 (six) hours as needed for moderate pain.      amLODipine (NORVASC) 5 MG tablet Take 5 mg by mouth daily.      carbidopa-levodopa (SINEMET IR) 25-100 MG tablet Take 1 tablet by mouth 4 (four) times daily. At 6 AM, 10 AM, 2 PM and 6 PM 360 tablet 2   chlorthalidone (HYGROTON) 25 MG tablet Take 12.5 mg by mouth daily.     gabapentin (NEURONTIN) 300 MG capsule Take 1 capsule by mouth at bedtime.     metFORMIN (GLUCOPHAGE) 500 MG tablet Take 500 mg by mouth daily with breakfast.     olmesartan (BENICAR) 20 MG tablet Take 20 mg by mouth daily.     simvastatin (ZOCOR) 40 MG tablet Take 40 mg by mouth every evening.     traMADol (ULTRAM) 50 MG tablet Take 1-2 tabs po q6hrs prn pain, start with lowest effective dose. 40 tablet 0   carbidopa-levodopa (SINEMET IR) 25-100 MG tablet Take 1 tablet by mouth 4 (four) times daily. At 6 AM, 10 AM, 2 PM and  6 PM 56 tablet 0   No facility-administered medications prior to visit.    PAST MEDICAL HISTORY: Past Medical History:  Diagnosis Date   Aortic atherosclerosis ([place]) 09/11/2016   noted on CXR   Arthritis    Baker's cyst, left 2014   Small, Left   Bilateral leg numbness    Cataract    Bilateral   Diabetes mellitus without complication ([place])    Diverticulosis 10/09/2016   Noted on CT abd/pelvis   Gout    Grade I diastolic dysfunction 92/04/9416   Noted on ECHO   Hepatic cyst 10/09/2016   Noted on CT abd/pelvis   History of iron deficiency anemia    History of migraine    Hypercholesteremia    Hypertension    Internal hemorrhoids    Lateral meniscal tear 2014   Left   LVH (left ventricular hypertrophy) 03/03/2018   noted on EKG   Obesity    PMB (postmenopausal bleeding)    Pneumonia 2018   Right upper lower   PONV (postoperative nausea and vomiting)    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   Right 1.3 cm, Noted on CT Chest, pt unaware   Renal cyst 10/09/2016   Noted on CT abd/pelvis   Thoracic aortic aneurysm ([place]) 12/09/2016   ascending 4.0 cm, noted on CT chest, pt unaware    PAST SURGICAL HISTORY: Past Surgical History:  Procedure Laterality Date   ABDOMINAL HYSTERECTOMY     CATARACT EXTRACTION Right 09/2010   CATARACT EXTRACTION Left 10/2010   COLONOSCOPY     DILATION AND CURETTAGE OF UTERUS     TOTAL KNEE ARTHROPLASTY Right 04/21/2018   Procedure: TOTAL KNEE ARTHROPLASTY;  Surgeon: Earlie Server, MD;  Location: [facility];  Service: Orthopedics;  Laterality: Right;  Adductor Block    FAMILY HISTORY: Family History  Problem Relation Age of Onset   Kidney disease Mother    Diabetes Mother    Congestive Heart Failure Mother    Cancer Father        unsure of type    SOCIAL HISTORY: Social History   Socioeconomic History   Marital status: Married    Spouse name: Not on file   Number of children: 2   Years of education: 12   Highest education  level: High school graduate  Occupational History   Occupation: Retired  Tobacco Use   Smoking status: Never   Smokeless tobacco: Never  Vaping Use   Vaping Use: Never used  Substance and Sexual Activity   Alcohol use: No   Drug use: No   Sexual activity: Not on file  Other Topics Concern   Not on file  Social History Narrative   Lives at home with her husband.   Right-handed.   1/4 cup coffee per day.   Social Determinants of Health   Financial Resource Strain: Not on file  Food Insecurity: Not on file  Transportation Needs: Not on file  Physical Activity: Not on file  Stress: Not on file  Social Connections: Not on file  Intimate Partner Violence: Not on file      PHYSICAL EXAM  Vitals:   02/26/21 1327  BP: (!) 158/88  Pulse: 77  Weight: 164 lb (74.4 kg)  Height: _0  (1.575 m)    Body mass index is 30 kg/m.  Generalized: Well developed, in no acute distress  Cardiology: normal rate and rhythm, no murmur noted Respiratory: clear to auscultation bilaterally  Neurological examination  Mentation: Alert oriented to time, place, history taking. Follows all commands speech and language fluent, mild hypophonia, no dysarthria noted.  Cranial nerve II-XII: Pupils were equal round reactive to light. Extraocular movements were full, visual field were full on confrontational test. Facial sensation and strength were normal. Head turning and shoulder shrug  were normal and symmetric. Motor: The motor testing reveals 4+ over 5 strength of all 4 extremities. Good symmetric motor tone is noted throughout. Right hand resting tremor noted, very slight left hand tremor. Bradykinesia noted with finger taps, bilateral cogwheel rigidity, worse on right.  Sensory: Sensory testing is intact to soft touch on all 4 extremities. No evidence of extinction is noted.  Coordination: Cerebellar testing reveals slow finger-nose-finger and heel-to-shin bilaterally.  Gait and station: Gait is  stable with rolling walker. Tandem not attempted.  Reflexes: Deep tendon reflexes are symmetric and normal bilaterally.   DIAGNOSTIC DATA (LABS, IMAGING, TESTING) - I reviewed patient records, labs, notes, testing and imaging myself where available.  No flowsheet data found.   Lab Results  Component Value Date   WBC 9.9 04/24/2018   HGB 10.5 (L) 04/24/2018   HCT 32.5 (L) 04/24/2018   MCV 92.3 04/24/2018   PLT 178 04/24/2018      Component Value Date/Time   NA 141 04/23/2018 0340   K 3.8 04/23/2018 0340   CL 105 04/23/2018 0340   CO2 29 04/23/2018 0340   GLUCOSE 114 (H) 04/23/2018 0340   BUN 17 04/23/2018 0340   CREATININE 0.88 04/23/2018 0340   CALCIUM 8.4 (L) 04/23/2018 0340   PROT 7.2 03/03/2018 1028   ALBUMIN 3.9 03/03/2018 1028   AST 14 (L) 03/03/2018 1028   ALT 12 03/03/2018 1028   ALKPHOS 77 03/03/2018 1028   BILITOT 0.5 03/03/2018 1028   GFRNONAA >60 04/23/2018 0340   GFRAA >60 04/23/2018 0340   Lab Results  Component Value Date   CHOL 176 10/18/2008   HDL 39 (L) 10/18/2008   LDLCALC 107 (H) 10/18/2008   TRIG 149 10/18/2008   CHOLHDL 4.5 Ratio 10/18/2008   Lab Results  Component Value Date   HGBA1C 6.0 (H) 04/14/2018   No results found for: [id number] Lab Results  Component Value Date   TSH 0.492 10/06/2016       ASSESSMENT AND PLAN 80 y.o. year old female  has a past medical history of Aortic atherosclerosis ([place]) (09/11/2016), Arthritis, Baker's cyst, left (2014), Bilateral leg numbness, Cataract, Diabetes mellitus without complication ([place]), Diverticulosis (10/09/2016), Gout, Grade I diastolic dysfunction (96/43/8381), Hepatic cyst (10/09/2016), History of iron deficiency anemia, History of migraine, Hypercholesteremia, Hypertension, Internal hemorrhoids, Lateral meniscal tear (2014), LVH (left ventricular hypertrophy) (03/03/2018), Obesity, PMB (postmenopausal bleeding), Pneumonia (2018), PONV (postoperative nausea and vomiting), Renal arterial  aneurysm ([place]) (12/09/2016), Renal cyst (10/09/2016), and Thoracic aortic aneurysm ([place]) (12/09/2016). here with     ICD-10-CM   1. Parkinson's disease ([place])  [place]    Ambulatory referral to Speech Therapy    2. Dysphagia, unspecified type  R13.10 DG SWALLOW FUNC SPEECH PATH    3. Slurred speech  R47.81 Ambulatory referral to [place] is fairly well from a Parkinson's perspective. She is tolerating Sinemet 1 tablet 4 times daily and feels tremor has improved. She will continue current dosing at 6am, 10am, 2pm and 6pm. She does express concerns of regular coughing after drinking thin liquids. No difficulty swallowing food. She is also concerned about her speech sounding more slurred to her. I do not note any significant dysarthria, today, but speech is hypophonic. I will send her for a swallowing evaluation as well as send a referral for outpatient speech therapy for formal evaluation of these concerns. She was encouraged to stay well hydrated and to drink fluids slowly while in an upright position. She will focus on regular physical and mental activity. Safety precautions advised. She will follow up in 3-4 months. She verbalizes understanding and agreement with this plan.    Orders Placed This Encounter  Procedures   DG SWALLOW FUNC SPEECH PATH    Standing Status:   Future    Standing Expiration Date:   02/26/2022    Order Specific Question:   Reason for Exam (SYMPTOM  OR DIAGNOSIS REQUIRED)    Answer:   parkinsons disease    Order Specific Question:   Where should this test be performed?    Answer:   [facility]   Ambulatory referral to Speech Therapy    Referral Priority:   Routine    Referral Type:   Speech Therapy    Referral Reason:   Specialty Services Required    Requested Specialty:   Speech Pathology    Number of Visits Requested:   1      No orders of the defined types were placed in this encounter.      Debbora Presto, FNP-C  02/26/2021, 4:29 PM [facility] [address], [place], [place] [address] [phone]

## 2021-03-06 DIAGNOSIS — E1165 Type 2 diabetes mellitus with hyperglycemia: Secondary | ICD-10-CM | POA: Diagnosis not present

## 2021-03-06 DIAGNOSIS — M1990 Unspecified osteoarthritis, unspecified site: Secondary | ICD-10-CM | POA: Diagnosis not present

## 2021-03-10 ENCOUNTER — Other Ambulatory Visit (HOSPITAL_COMMUNITY): Payer: Self-pay | Admitting: Specialist

## 2021-03-10 DIAGNOSIS — G20 Parkinson's disease: Secondary | ICD-10-CM

## 2021-03-11 ENCOUNTER — Encounter (HOSPITAL_COMMUNITY): Payer: Self-pay | Admitting: Speech Pathology

## 2021-03-11 ENCOUNTER — Other Ambulatory Visit: Payer: Self-pay

## 2021-03-11 ENCOUNTER — Ambulatory Visit (HOSPITAL_COMMUNITY): Payer: Medicare HMO | Attending: Family Medicine | Admitting: Speech Pathology

## 2021-03-11 DIAGNOSIS — R1312 Dysphagia, oropharyngeal phase: Secondary | ICD-10-CM | POA: Diagnosis not present

## 2021-03-11 DIAGNOSIS — R471 Dysarthria and anarthria: Secondary | ICD-10-CM | POA: Diagnosis not present

## 2021-03-11 NOTE — Therapy (Signed)
[facility] [address] [place], [place], [address] Phone: [phone]   Fax:  [phone]  Speech Language Pathology Evaluation  Patient Details  Name: [name] MRN: [id number] Date of Birth: December 20, 1940 Referring Provider (SLP): Shawnie Dapper, NP, neurology   Encounter Date: 03/11/2021   End of Session - 03/11/21 1240     Visit Number 1    Number of Visits 13    Date for SLP Re-Evaluation 05/07/21    Authorization Type [employer] H[employer]    SLP Start Time 1030    SLP Stop Time  1130    SLP Time Calculation (min) 60 min    Activity Tolerance Patient tolerated treatment well             Past Medical History:  Diagnosis Date   Aortic atherosclerosis (HCC) 09/11/2016   noted on CXR   Arthritis    Baker's cyst, left 2014   Small, Left   Bilateral leg numbness    Cataract    Bilateral   Diabetes mellitus without complication (HCC)    Diverticulosis 10/09/2016   Noted on CT abd/pelvis   Gout    Grade I diastolic dysfunction 09/12/2016   Noted on ECHO   Hepatic cyst 10/09/2016   Noted on CT abd/pelvis   History of iron deficiency anemia    History of migraine    Hypercholesteremia    Hypertension    Internal hemorrhoids    Lateral meniscal tear 2014   Left   LVH (left ventricular hypertrophy) 03/03/2018   noted on EKG   Obesity    PMB (postmenopausal bleeding)    Pneumonia 2018   Right upper lower   PONV (postoperative nausea and vomiting)    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   Right 1.3 cm, Noted on CT Chest, pt unaware   Renal cyst 10/09/2016   Noted on CT abd/pelvis   Thoracic aortic aneurysm 12/09/2016   ascending 4.0 cm, noted on CT chest, pt unaware    Past Surgical History:  Procedure Laterality Date   ABDOMINAL HYSTERECTOMY     CATARACT EXTRACTION Right 09/2010   CATARACT EXTRACTION Left 10/2010   COLONOSCOPY     DILATION AND CURETTAGE OF UTERUS     TOTAL KNEE ARTHROPLASTY Right  04/21/2018   Procedure: TOTAL KNEE ARTHROPLASTY;  Surgeon: Frederico Hamman, MD;  Location: [facility];  Service: Orthopedics;  Laterality: Right;  Adductor Block    There were no vitals filed for this visit.   Subjective Assessment - 03/11/21 1224     Subjective "I have trouble swallowing liquids and my voice is not as loud."    Patient is accompained by: Family member    Special Tests EAT-10, VHI    Currently in Pain? No/denies                SLP Evaluation [facility] - 03/11/21 1224       SLP Visit Information   SLP Received On 03/11/21    Referring Provider (SLP) Shawnie Dapper, NP, neurology    Onset Date 02/26/21    Medical Diagnosis Parkinson's disease      Subjective   Patient/Family Stated Goal Improve swallow and speech      General Information   HPI [name] is an 80 yo female who was referred by Shawnie Dapper, NP (neurology) for SLP evaluation and treatment for dysarthria and dysphagia in setting of Parkinson's disease (diagnosed in 2021). She takes generic Sinemet 1 tablet QID.  She uses a Museum/gallery exhibitions officer for ambulation. She reported changes to her speech and swallowing during her last visit with neurology (coughing with liquids).    Behavioral/Cognition alert and cooperative    Mobility Status rollator      Balance Screen   Has the patient fallen in the past 6 months No    Has the patient had a decrease in activity level because of a fear of falling?  Yes    Is the patient reluctant to leave their home because of a fear of falling?  Yes      Prior Functional Status   Cognitive/Linguistic Baseline Within functional limits    Type of Home House     Lives With Spouse    Available Support Family    Education graduated high school    Vocation Retired      IT consultant   Overall Cognitive Status Within Functional Limits for tasks assessed      Auditory Comprehension   IT sales professional   Discrimination Within Function  Limits      Reading Comprehension   Reading Status Within funtional limits      Expression   Primary Mode of Expression Verbal      Verbal Expression   Overall Verbal Expression Impaired    Initiation Impaired    Automatic Speech Social Response;Counting;Day of week    Level of Generative/Spontaneous Verbalization Conversation    Repetition No impairment    Naming No impairment    Pragmatics No impairment    Interfering Components Speech intelligibility    Non-Verbal Means of Communication Not applicable      Written Expression   Dominant Hand Right      Oral Motor/Sensory Function   Overall Oral Motor/Sensory Function Impaired   reduced coordination, mild lingual weakness     Motor Speech   Overall Motor Speech Impaired    Respiration Impaired    Level of Impairment Sentence    Phonation Low vocal intensity    Resonance Within functional limits    Articulation Impaired    Level of Impairment Sentence    Intelligibility Intelligibility reduced    Sentence 75-100% accurate    Conversation 75-100% accurate    Motor Planning Witnin functional limits    Motor Speech Errors Unaware;Aware    Effective Techniques Increased vocal intensity;Over-articulate;Pacing    Phonation Impaired   glottal fry, tremor   Volume Soft;Decibel Level   67 dB conversation   Pitch Low            The Voice Handicap Index-10 (VHI-10) was administered. This survey is a series of questions targeting the patient's perception of his/her own voice using a scale of 0-4 (0=Never, 4=Always). Score greater than 11 is abnormal. My voice makes it difficult for people to hear me. 2  People have difficulty understanding me in a noisy room. 2  My voice difficulties restrict personal and social life. 2 I feel left out of conversations because of my voice. 2  My voice problem causes me to lose income. 0  I feel as though I have to strain to produce voice. 2  The clarity of my voice is unpredictable. 2  My  voice problem upsets me. 2  My voice makes me feel handicapped. 2  People ask, "What's wrong with your voice?" 0  TOTAL SCORE:  [X]  Abnormal (raw score >11) []  Normal 16/40   EATING ASSESSMENT TOOL (EAT-10)   The patient was asked  to rate to what extent the following statements are problematic on a scale of 0-4. 0 = No problem; 4 = Severe problem. A total score of 3 or higher is considered abnormal. My swallowing problem has caused me to lose weight. 1  My swallowing problem interferes with my ability to go out for meals. 3  Swallowing liquids takes extra effort. 4 Swallowing solids takes extra effort. 2  Swallowing pills takes extra effort. 3 Swallowing is painful. 0  The pleasure of eating is affected by my swallowing. 3  When I swallow food sticks in my throat. 0  I cough when I eat. 4 Swallowing is stressful. 3  TOTAL SCORE: 23 [X]  Abnormal (raw score >3) []  Normal     Respiration Maximum loudness (sustained phonation): 74 dB SPL  Loudness in connected speech (reading a paragraph): average of 69 dB SPL; decreased loudness Loudness in connected speech (spontaneous speech): average of 67 dB SPL; decreased loudness Maximum phonation duration: 9 seconds; WFL [Minimum: young female: 50 s, young fem: 46 s, elderly female: 62 s, elderly fem: 10 s 18, [place], Rudell Cobb 1987)]  Connected speech: reduced loudness ; speaks on residual air, reduced breath support     SLP Education - 03/11/21 1240     Education Details Plan for MBSS next week and plan for SPEAKOUT! treatment    Person(s) Educated Patient;Spouse    Methods Explanation    Comprehension Verbalized understanding              SLP Short Term Goals - 03/11/21 1249       SLP SHORT TERM GOAL #1   Title Pt will coordinate vocal and articulatory subsystems in hierarchical speech tasks by producing sounds with intention with min assistance.    Baseline mod assist    Time 6    Period Weeks    Status New    Target  Date 05/06/21      SLP SHORT TERM GOAL #2   Title Read phrases, sentences, and paragraphs with intention, yielding improved vocal quality, loudness, articulatory precision, and endurance while maintaining a minimum of 85 dB with min assistance.    Baseline mod assist    Time 6    Period Weeks    Status New    Target Date 05/06/21      SLP SHORT TERM GOAL #3   Title Generalize intentional speech to cognitive-linguistic exercises and conversational speech with improved vocal quality, loudness, articulatory precision, and endurance while maintaining a minimum of 85 dB with min    Baseline mod assist    Time 6    Period Weeks    Status New    Target Date 05/06/21                Plan - 03/11/21 1241     Clinical Impression Statement Pt presents with mild hypokinetic dysarthria characterized by reduced vocal intensity, imprecise articulation, monopitch, tremor, and glottal fry. Speech is judged to be 90% intelligible in conversation with an unknown listener in a quiet environment. Pt exhibited the following: conversation 67 dB, paragraph reading 69 dB, and sustained phonation 74 dB for 9 seconds. Pt stimulable for "speaking with intent" and increased sustained phonation to 85 dB and oral reading to 84 dB. See above for specifics of Pt questionnaires (VHI 16, EAT 23). Pt demonstrated improvement with SPEAKOUT! (speaking with intent) techniques and is motivated to incorporate at home. Pt will benefit from skilled SLP in order to address the above impairments, maximize  independence with communication, and to establish ongoing lifestyle home program in individuals with Parkinson's. In addition, it should be noted that Pt is experiencing consistent coughing when drinking thin liquids. Pt is scheduled for MBSS sometime next week. Recommend skilled SLP therapy 2x/per week for 6 weeks to address dysarthria and dysphagia. Pt's husband is supportive of the plan.    Speech Therapy Frequency 2x / week     Duration Other (comment)   6 weeks   Treatment/Interventions Compensatory strategies;Patient/family education;Cueing hierarchy;SLP instruction and feedback;Pharyngeal strengthening exercises    Potential to Achieve Goals Good    SLP Home Exercise Plan Pt will completed HEP as assigned to facilitate carryover of treatment strategies and techniques in home environment with use of written cues as needed.    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   Acute encephalopathy 04/22/2018   Bradypnea    Primary localized osteoarthritis of right knee 02/10/2018   Acute viral syndrome 10/06/2016   Weakness 10/06/2016   Hypercholesteremia 10/06/2016   Gout 10/06/2016   Diabetes mellitus without complication (HCC) 10/06/2016   Viral syndrome 10/06/2016   Precordial chest pain 09/11/2016   Diabetes (HCC) 05/03/2016   HYPOKALEMIA 11/01/2008   HYPERGLYCEMIA, FASTING 11/01/2008   Type 2 diabetes mellitus with hyperlipidemia (HCC) 10/17/2008   GOUT, UNSPECIFIED 10/17/2008   OBESITY 10/17/2008   MIGRAINE HEADACHE 10/17/2008   Essential hypertension 10/17/2008   INTERNAL HEMORRHOIDS 10/17/2008   DIVERTICULOSIS, COLON 10/17/2008   ARTHRITIS 10/17/2008   Thank you,  Havery Moros, CCC-SLP [phone]  Keyasha Miah 03/12/2021, 3:33 PM  [facility] [address] [place], [place], [address] Phone: [phone]   Fax:  [phone]  Name: [name] MRN: [id number] Date of Birth: 05/13/1941

## 2021-03-13 ENCOUNTER — Telehealth (HOSPITAL_COMMUNITY): Payer: Self-pay | Admitting: Speech Pathology

## 2021-03-13 NOTE — Telephone Encounter (Signed)
per  Waynetta Sandy need new Dx codes - s/w Kayla she will get the request sent to MD and call us back and fax updates. [phone]

## 2021-03-17 ENCOUNTER — Encounter (HOSPITAL_COMMUNITY): Payer: Self-pay | Admitting: Speech Pathology

## 2021-03-17 ENCOUNTER — Ambulatory Visit (HOSPITAL_COMMUNITY): Payer: Medicare HMO | Admitting: Speech Pathology

## 2021-03-17 ENCOUNTER — Other Ambulatory Visit: Payer: Self-pay

## 2021-03-17 ENCOUNTER — Ambulatory Visit (HOSPITAL_COMMUNITY)
Admission: RE | Admit: 2021-03-17 | Discharge: 2021-03-17 | Disposition: A | Discharge status: Home / Self Care | Payer: Medicare HMO | Source: Ambulatory Visit | Attending: Family Medicine | Admitting: Family Medicine

## 2021-03-17 DIAGNOSIS — R1312 Dysphagia, oropharyngeal phase: Secondary | ICD-10-CM | POA: Diagnosis not present

## 2021-03-17 DIAGNOSIS — G20 Parkinson's disease: Secondary | ICD-10-CM

## 2021-03-17 DIAGNOSIS — R471 Dysarthria and anarthria: Secondary | ICD-10-CM | POA: Diagnosis not present

## 2021-03-17 NOTE — Therapy (Signed)
[facility] [address] [place], [place], [address] Phone: [phone]   Fax:  [phone]  Modified Barium Swallow  Patient Details  Name: [name] MRN: [id number] Date of Birth: 19-Nov-1940 Referring Provider (SLP): Shawnie Dapper, NP, neurology   Encounter Date: 03/17/2021   End of Session - 03/17/21 1750     Visit Number 1    Number of Visits 13    Date for SLP Re-Evaluation 05/07/21    Authorization Type [employer] HMO    SLP Start Time 1330    SLP Stop Time  1410    SLP Time Calculation (min) 40 min    Activity Tolerance Patient tolerated treatment well             Past Medical History:  Diagnosis Date   Aortic atherosclerosis (HCC) 09/11/2016   noted on CXR   Arthritis    Baker's cyst, left 2014   Small, Left   Bilateral leg numbness    Cataract    Bilateral   Diabetes mellitus without complication (HCC)    Diverticulosis 10/09/2016   Noted on CT abd/pelvis   Gout    Grade I diastolic dysfunction 09/12/2016   Noted on ECHO   Hepatic cyst 10/09/2016   Noted on CT abd/pelvis   History of iron deficiency anemia    History of migraine    Hypercholesteremia    Hypertension    Internal hemorrhoids    Lateral meniscal tear 2014   Left   LVH (left ventricular hypertrophy) 03/03/2018   noted on EKG   Obesity    PMB (postmenopausal bleeding)    Pneumonia 2018   Right upper lower   PONV (postoperative nausea and vomiting)    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   Right 1.3 cm, Noted on CT Chest, pt unaware   Renal cyst 10/09/2016   Noted on CT abd/pelvis   Thoracic aortic aneurysm 12/09/2016   ascending 4.0 cm, noted on CT chest, pt unaware    Past Surgical History:  Procedure Laterality Date   ABDOMINAL HYSTERECTOMY     CATARACT EXTRACTION Right 09/2010   CATARACT EXTRACTION Left 10/2010   COLONOSCOPY     DILATION AND CURETTAGE OF UTERUS     TOTAL KNEE ARTHROPLASTY Right 04/21/2018    Procedure: TOTAL KNEE ARTHROPLASTY;  Surgeon: Frederico Hamman, MD;  Location: [facility];  Service: Orthopedics;  Laterality: Right;  Adductor Block    There were no vitals filed for this visit.   Subjective Assessment - 03/17/21 1745     Subjective "It has been a little better since I hold the sip in my mouth first."    Special Tests MBSS    Currently in Pain? No/denies                 General - 03/17/21 1746       General Information   Date of Onset 03/11/21    HPI [name] is an 80 yo female who was referred by Shawnie Dapper, NP (neurology) for SLP evaluation and treatment for dysarthria and dysphagia in setting of Parkinson's disease (diagnosed in 2021). She takes generic Sinemet 1 tablet QID. She uses a Museum/gallery exhibitions officer for ambulation. She reported changes to her speech and swallowing during her last visit with neurology (coughing with liquids).    Type of Study MBS-Modified Barium Swallow Study    Diet Prior to this Study Regular;Thin liquids    Temperature Spikes Noted No    Respiratory  Status Room air    History of Recent Intubation No    Behavior/Cognition Alert;Cooperative;Pleasant mood    Oral Cavity Assessment Within Functional Limits    Oral Care Completed by SLP No    Oral Cavity - Dentition Adequate natural dentition    Vision Functional for self feeding    Self-Feeding Abilities Able to feed self    Patient Positioning Upright in chair    Baseline Vocal Quality Normal    Volitional Cough Strong    Volitional Swallow Able to elicit    Anatomy Within functional limits    Pharyngeal Secretions Not observed secondary MBS                Oral Preparation/Oral Phase - 03/17/21 1747       Oral Preparation/Oral Phase   Oral Phase Within functional limits      Electrical stimulation - Oral Phase   Was Electrical Stimulation Used No              Pharyngeal Phase - 03/17/21 1747       Pharyngeal Phase   Pharyngeal Phase Impaired      Pharyngeal - Thin    Pharyngeal- Thin Teaspoon Swallow initiation at pyriform sinus;Penetration/Aspiration before swallow;Trace aspiration    Pharyngeal Material enters airway, passes BELOW cords and not ejected out despite cough attempt by patient;Material does not enter airway;Material enters airway, remains ABOVE vocal cords then ejected out   trace (tiny) amount aspirated   Pharyngeal- Thin Cup Swallow initiation at vallecula;Swallow initiation at pyriform sinus;Penetration/Aspiration during swallow    Pharyngeal Material enters airway, remains ABOVE vocal cords then ejected out;Material does not enter airway    Pharyngeal- Thin Straw Swallow initiation at pyriform sinus      Pharyngeal - Solids   Pharyngeal- Puree Within functional limits    Pharyngeal- Regular Within functional limits    Pharyngeal- Pill Penetration/Aspiration during swallow    Pharyngeal Material enters airway, remains ABOVE vocal cords then ejected out      Electrical Stimulation - Pharyngeal Phase   Was Electrical Stimulation Used No              Cricopharyngeal Phase - 03/17/21 1749       Cervical Esophageal Phase   Cervical Esophageal Phase Within functional limits                  SLP Short Term Goals - 03/11/21 1249       SLP SHORT TERM GOAL #1   Title Pt will coordinate vocal and articulatory subsystems in hierarchical speech tasks by producing sounds with intention with min assistance.    Baseline mod assist    Time 6    Period Weeks    Status New    Target Date 05/06/21      SLP SHORT TERM GOAL #2   Title Read phrases, sentences, and paragraphs with intention, yielding improved vocal quality, loudness, articulatory precision, and endurance while maintaining a minimum of 85 dB with min assistance.    Baseline mod assist    Time 6    Period Weeks    Status New    Target Date 05/06/21      SLP SHORT TERM GOAL #3   Title Generalize intentional speech to cognitive-linguistic exercises and  conversational speech with improved vocal quality, loudness, articulatory precision, and endurance while maintaining a minimum of 85 dB with min    Baseline mod assist    Time 6    Period Weeks  Status New    Target Date 05/06/21                Plan - 03/17/21 1751     Clinical Impression Statement Pt presents mild oropharyngeal dysphagia characterized by premature spillage of liquids to the pyriforms and x1 into the laryngeal vestibule with tsp presentation in which trace amount (barely visible) was aspirated before the swallow. Pt with a spontaneous cough, however had already dropped down posterior tracheal wall. Pt with flash penetration with cup sip thin during the swallow (removed) and penetration to the cords with cup thin when taking barium tablet with a spontaneous cough and removal. Pt with min lingual residue of liquids after the initial swallow, however Pt triggers a second swallow to clear. Recommend regular textures and thin liquids via small sips with oral bolus hold and then swallow 2x. Pt is already completing this, but will be reinforced next treatment session when she returns for [facility]!    Speech Therapy Frequency 2x / week    Treatment/Interventions Compensatory strategies;Patient/family education;Cueing hierarchy;SLP instruction and feedback;Pharyngeal strengthening exercises    Potential to Achieve Goals Good    SLP Home Exercise Plan Pt will completed HEP as assigned to facilitate carryover of treatment strategies and techniques in home environment with use of written cues as needed.    Consulted and Agree with Plan of Care Patient;Family member/caregiver    Family Member Consulted spouse, Preslea Rhodus             Patient will benefit from skilled therapeutic intervention in order to improve the following deficits and impairments:   Dysphagia, oropharyngeal phase     Recommendations/Treatment - 03/17/21 1749       Swallow Evaluation Recommendations    SLP Diet Recommendations Thin;Age appropriate regular    Liquid Administration via Cup;Straw    Medication Administration Whole meds with liquid    Supervision Patient able to self feed    Compensations Small sips/bites;Multiple dry swallows after each bite/sip;Other (Comment)   oral bolus hold with thins, then swallow   Postural Changes Remain upright for at least 30 minutes after feeds/meals;Seated upright at 90 degrees              Prognosis - 03/17/21 1750       Prognosis   Prognosis for Safe Diet Advancement Good      Individuals Consulted   Consulted and Agree with Results and Recommendations Patient    Report Sent to  Referring physician             Problem List Patient Active Problem List   Diagnosis Date Noted   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   Acute encephalopathy 04/22/2018   Bradypnea    Primary localized osteoarthritis of right knee 02/10/2018   Acute viral syndrome 10/06/2016   Weakness 10/06/2016   Hypercholesteremia 10/06/2016   Gout 10/06/2016   Diabetes mellitus without complication (HCC) 10/06/2016   Viral syndrome 10/06/2016   Precordial chest pain 09/11/2016   Diabetes (HCC) 05/03/2016   HYPOKALEMIA 11/01/2008   HYPERGLYCEMIA, FASTING 11/01/2008   Type 2 diabetes mellitus with hyperlipidemia (HCC) 10/17/2008   GOUT, UNSPECIFIED 10/17/2008   OBESITY 10/17/2008   MIGRAINE HEADACHE 10/17/2008   Essential hypertension 10/17/2008   INTERNAL HEMORRHOIDS 10/17/2008   DIVERTICULOSIS, COLON 10/17/2008   ARTHRITIS 10/17/2008   Thank you,  Havery Moros, CCC-SLP [phone]  Wyoma Genson 03/17/2021, 5:51 PM  [facility] [address] [place], [place], [address] Phone: [phone]  Fax:  [phone]  Name: CHANCEY CULLINANE MRN: [id number] Date of Birth: 09/15/1940

## 2021-03-19 ENCOUNTER — Other Ambulatory Visit: Payer: Self-pay

## 2021-03-19 ENCOUNTER — Ambulatory Visit (HOSPITAL_COMMUNITY): Payer: Medicare HMO | Admitting: Speech Pathology

## 2021-03-19 ENCOUNTER — Encounter (HOSPITAL_COMMUNITY): Payer: Self-pay | Admitting: Speech Pathology

## 2021-03-19 DIAGNOSIS — R1312 Dysphagia, oropharyngeal phase: Secondary | ICD-10-CM | POA: Diagnosis not present

## 2021-03-19 DIAGNOSIS — R471 Dysarthria and anarthria: Secondary | ICD-10-CM

## 2021-03-19 NOTE — Therapy (Signed)
[facility] [address] [place], [place], [address] Phone: [phone]   Fax:  [phone]  Speech Language Pathology Treatment  Patient Details  Name: [name] MRN: [id number] Date of Birth: 1940/10/02 Referring Provider (SLP): Shawnie Dapper, NP, neurology   Encounter Date: 03/19/2021   End of Session - 03/19/21 1339     Visit Number 3    Number of Visits 13    Date for SLP Re-Evaluation 05/07/21    Authorization Type [employer] H[employer]    SLP Start Time 1030    SLP Stop Time  1115    SLP Time Calculation (min) 45 min    Activity Tolerance Patient tolerated treatment well             Past Medical History:  Diagnosis Date   Aortic atherosclerosis (HCC) 09/11/2016   noted on CXR   Arthritis    Baker's cyst, left 2014   Small, Left   Bilateral leg numbness    Cataract    Bilateral   Diabetes mellitus without complication (HCC)    Diverticulosis 10/09/2016   Noted on CT abd/pelvis   Gout    Grade I diastolic dysfunction 09/12/2016   Noted on ECHO   Hepatic cyst 10/09/2016   Noted on CT abd/pelvis   History of iron deficiency anemia    History of migraine    Hypercholesteremia    Hypertension    Internal hemorrhoids    Lateral meniscal tear 2014   Left   LVH (left ventricular hypertrophy) 03/03/2018   noted on EKG   Obesity    PMB (postmenopausal bleeding)    Pneumonia 2018   Right upper lower   PONV (postoperative nausea and vomiting)    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   Right 1.3 cm, Noted on CT Chest, pt unaware   Renal cyst 10/09/2016   Noted on CT abd/pelvis   Thoracic aortic aneurysm 12/09/2016   ascending 4.0 cm, noted on CT chest, pt unaware    Past Surgical History:  Procedure Laterality Date   ABDOMINAL HYSTERECTOMY     CATARACT EXTRACTION Right 09/2010   CATARACT EXTRACTION Left 10/2010   COLONOSCOPY     DILATION AND CURETTAGE OF UTERUS     TOTAL KNEE ARTHROPLASTY Right  04/21/2018   Procedure: TOTAL KNEE ARTHROPLASTY;  Surgeon: Frederico Hamman, MD;  Location: [facility];  Service: Orthopedics;  Laterality: Right;  Adductor Block    There were no vitals filed for this visit.   Subjective Assessment - 03/19/21 1334     Subjective "She coughed this morning when she was taking her medicine and drinking from a bottle of water."    Patient is accompained by: Family member    Currently in Pain? No/denies                ADULT SLP TREATMENT - 03/19/21 1032       General Information   Behavior/Cognition Alert;Cooperative;Pleasant mood    Patient Positioning Upright in chair    Oral care provided N/A    HPI [name] is an 80 yo female who was referred by Shawnie Dapper, NP (neurology) for SLP evaluation and treatment for dysarthria and dysphagia in setting of Parkinson's disease (diagnosed in 2021). She takes generic Sinemet 1 tablet QID. She uses a Museum/gallery exhibitions officer for ambulation. She reported changes to her speech and swallowing during her last visit with neurology (coughing with liquids).      Treatment Provided   Treatment  provided Dysphagia;Cognitive-Linquistic      Dysphagia Treatment   Temperature Spikes Noted No    Respiratory Status Room air    Oral Cavity - Dentition Adequate natural dentition    Treatment Methods Compensation strategy training;Patient/caregiver education    Patient observed directly with PO's No      Pain Assessment   Pain Assessment No/denies pain      Cognitive-Linquistic Treatment   Skilled Treatment Speakout! treatment protocol, cycles of SLP guided warm up, sustained phonation, glides, counting, reading, and cognitive speech tasks. SLP provided feedback and cues to maximize accuracy/effort.      Assessment / Recommendations / Plan   Plan Continue with current plan of care      Progression Toward Goals   Progression toward goals Progressing toward goals              SLP Education - 03/19/21 1338     Education  Details Reviewed imaging from [facility] and provided lesson 1 + for HEP in [facility]!    Person(s) Educated Patient    Methods Explanation;Handout    Comprehension Verbalized understanding              SLP Short Term Goals - 03/19/21 1344       SLP SHORT TERM GOAL #1   Title Pt will coordinate vocal and articulatory subsystems in hierarchical speech tasks by producing sounds with intention with min assistance.    Baseline mod assist    Time 6    Period Weeks    Status On-going    Target Date 05/06/21      SLP SHORT TERM GOAL #2   Title Read phrases, sentences, and paragraphs with intention, yielding improved vocal quality, loudness, articulatory precision, and endurance while maintaining a minimum of 85 dB with min assistance.    Baseline mod assist    Time 6    Period Weeks    Status On-going    Target Date 05/06/21      SLP SHORT TERM GOAL #3   Title Generalize intentional speech to cognitive-linguistic exercises and conversational speech with improved vocal quality, loudness, articulatory precision, and endurance while maintaining a minimum of 85 dB with min    Baseline mod assist    Time 6    Period Weeks    Status On-going    Target Date 05/06/21                Plan - 03/19/21 1341     Clinical Impression Statement Pt was accompanied to therapy today by her sister. SLP shared an informational video on Parkinson's and SPEAKOUT! [facility] imaging was reviewed with the Pt and her sister with the recommendation for taking small sips of thin liquids and consider taking medication whole in puree. Pt reports less coughing episodes with small sips and oral bolus hold before swallow. Pt completed lesson 1 in workbook with moderate cues from SLP. Pt produced m-cycle with ~90 dB, /a/ with ~94 dB, glides with ~86 dB, counting with ~89 dB, oral reading with 88 dB and cognitive tasks with 85 dB.    Speech Therapy Frequency 2x / week    Treatment/Interventions Compensatory  strategies;Patient/family education;Cueing hierarchy;SLP instruction and feedback;Pharyngeal strengthening exercises    Potential to Achieve Goals Good    SLP Home Exercise Plan Pt will completed HEP as assigned to facilitate carryover of treatment strategies and techniques in home environment with use of written cues as needed.    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   Acute encephalopathy 04/22/2018   Bradypnea    Primary localized osteoarthritis of right knee 02/10/2018   Acute viral syndrome 10/06/2016   Weakness 10/06/2016   Hypercholesteremia 10/06/2016   Gout 10/06/2016   Diabetes mellitus without complication (HCC) 10/06/2016   Viral syndrome 10/06/2016   Precordial chest pain 09/11/2016   Diabetes (HCC) 05/03/2016   HYPOKALEMIA 11/01/2008   HYPERGLYCEMIA, FASTING 11/01/2008   Type 2 diabetes mellitus with hyperlipidemia (HCC) 10/17/2008   GOUT, UNSPECIFIED 10/17/2008   OBESITY 10/17/2008   MIGRAINE HEADACHE 10/17/2008   Essential hypertension 10/17/2008   INTERNAL HEMORRHOIDS 10/17/2008   DIVERTICULOSIS, COLON 10/17/2008   ARTHRITIS 10/17/2008   Thank you,  Havery Moros, CCC-SLP [phone]  [name] 03/19/2021, 1:47 PM  [facility] [address] [place], [place], [address] Phone: [phone]   Fax:  [phone]   Name: [name] MRN: [id number] Date of Birth: 1940/07/22

## 2021-03-23 ENCOUNTER — Ambulatory Visit (HOSPITAL_COMMUNITY): Payer: Medicare HMO | Admitting: Speech Pathology

## 2021-03-26 ENCOUNTER — Ambulatory Visit (HOSPITAL_COMMUNITY): Payer: Medicare HMO | Admitting: Speech Pathology

## 2021-03-26 ENCOUNTER — Other Ambulatory Visit: Payer: Self-pay

## 2021-03-26 ENCOUNTER — Encounter (HOSPITAL_COMMUNITY): Payer: Self-pay | Admitting: Speech Pathology

## 2021-03-26 DIAGNOSIS — R471 Dysarthria and anarthria: Secondary | ICD-10-CM

## 2021-03-26 DIAGNOSIS — R1312 Dysphagia, oropharyngeal phase: Secondary | ICD-10-CM | POA: Diagnosis not present

## 2021-03-26 NOTE — Therapy (Signed)
[facility] [address] [place], [place], [address] Phone: [phone]   Fax:  [phone]  Speech Language Pathology Treatment  Patient Details  Name: [name] MRN: [id number] Date of Birth: December 04, 1940 Referring Provider (SLP): Shawnie Dapper, NP, neurology   Encounter Date: 03/26/2021   End of Session - 03/26/21 1059     Visit Number 4    Number of Visits 13    Date for SLP Re-Evaluation 05/07/21    Authorization Type [employer] H[employer]    SLP Start Time 1030    SLP Stop Time  1115    SLP Time Calculation (min) 45 min    Activity Tolerance Patient tolerated treatment well             Past Medical History:  Diagnosis Date   Aortic atherosclerosis (HCC) 09/11/2016   noted on CXR   Arthritis    Baker's cyst, left 2014   Small, Left   Bilateral leg numbness    Cataract    Bilateral   Diabetes mellitus without complication (HCC)    Diverticulosis 10/09/2016   Noted on CT abd/pelvis   Gout    Grade I diastolic dysfunction 09/12/2016   Noted on ECHO   Hepatic cyst 10/09/2016   Noted on CT abd/pelvis   History of iron deficiency anemia    History of migraine    Hypercholesteremia    Hypertension    Internal hemorrhoids    Lateral meniscal tear 2014   Left   LVH (left ventricular hypertrophy) 03/03/2018   noted on EKG   Obesity    PMB (postmenopausal bleeding)    Pneumonia 2018   Right upper lower   PONV (postoperative nausea and vomiting)    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   Right 1.3 cm, Noted on CT Chest, pt unaware   Renal cyst 10/09/2016   Noted on CT abd/pelvis   Thoracic aortic aneurysm 12/09/2016   ascending 4.0 cm, noted on CT chest, pt unaware    Past Surgical History:  Procedure Laterality Date   ABDOMINAL HYSTERECTOMY     CATARACT EXTRACTION Right 09/2010   CATARACT EXTRACTION Left 10/2010   COLONOSCOPY     DILATION AND CURETTAGE OF UTERUS     TOTAL KNEE ARTHROPLASTY Right  04/21/2018   Procedure: TOTAL KNEE ARTHROPLASTY;  Surgeon: Frederico Hamman, MD;  Location: [facility];  Service: Orthopedics;  Laterality: Right;  Adductor Block    There were no vitals filed for this visit.   Subjective Assessment - 03/26/21 1050     Subjective "I am doing fine!" (said with intent)    Patient is accompained by: Family member    Currently in Pain? No/denies              ADULT SLP TREATMENT - 03/26/21 1051       General Information   Behavior/Cognition Alert;Cooperative;Pleasant mood    Patient Positioning Upright in chair    Oral care provided N/A    HPI [name] is an 80 yo female who was referred by Shawnie Dapper, NP (neurology) for SLP evaluation and treatment for dysarthria and dysphagia in setting of Parkinson's disease (diagnosed in 2021). She takes generic Sinemet 1 tablet QID. She uses a Museum/gallery exhibitions officer for ambulation. She reported changes to her speech and swallowing during her last visit with neurology (coughing with liquids).      Treatment Provided   Treatment provided Cognitive-Linquistic      Pain Assessment   Pain  Assessment No/denies pain      Cognitive-Linquistic Treatment   Treatment focused on Dysarthria;Patient/family/caregiver education    Skilled Treatment Speakout! treatment protocol, cycles of SLP guided warm up, sustained phonation, glides, counting, reading, and cognitive speech tasks. SLP provided feedback and cues to maximize accuracy/effort.      Assessment / Recommendations / Plan   Plan Continue with current plan of care      Progression Toward Goals   Progression toward goals Progressing toward goals              SLP Education - 03/26/21 1054     Education Details SPEAKOUT! lessons 6+    Person(s) Educated Patient;Spouse    Methods Explanation;Handout    Comprehension Verbalized understanding              SLP Short Term Goals - 03/26/21 1137       SLP SHORT TERM GOAL #1   Title Pt will coordinate vocal and  articulatory subsystems in hierarchical speech tasks by producing sounds with intention with min assistance.    Baseline mod assist    Time 6    Period Weeks    Status On-going    Target Date 05/06/21      SLP SHORT TERM GOAL #2   Title Read phrases, sentences, and paragraphs with intention, yielding improved vocal quality, loudness, articulatory precision, and endurance while maintaining a minimum of 85 dB with min assistance.    Baseline mod assist    Time 6    Period Weeks    Status On-going    Target Date 05/06/21      SLP SHORT TERM GOAL #3   Title Generalize intentional speech to cognitive-linguistic exercises and conversational speech with improved vocal quality, loudness, articulatory precision, and endurance while maintaining a minimum of 85 dB with min    Baseline mod assist    Time 6    Period Weeks    Status On-going    Target Date 05/06/21                Plan - 03/26/21 1129     Clinical Impression Statement Pt was accompanied to therapy today by her husband. Her husband reports a dramatic improvement in his ability to understand his wife (church members also commented). Pt reports less coughing episodes with small sips and oral bolus hold before swallow with thin liquids. Pt completed lesson 7 in workbook with min/mod cues from SLP. Pt produced m-cycle with ~90 dB, /a/ with ~94 dB, glides with ~89 dB, counting with ~90 dB, oral reading with 90 dB and cognitive tasks with 85 dB. Pt was noted to speak with intent outside of the treatment room before and after our session. Continue plan of care.    Speech Therapy Frequency 2x / week    Treatment/Interventions Compensatory strategies;Patient/family education;Cueing hierarchy;SLP instruction and feedback;Pharyngeal strengthening exercises    Potential to Achieve Goals Good    SLP Home Exercise Plan Pt will completed HEP as assigned to facilitate carryover of treatment strategies and techniques in home environment with  use of written cues as needed.    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   Acute encephalopathy 04/22/2018   Bradypnea    Primary localized osteoarthritis of right knee 02/10/2018   Acute viral syndrome 10/06/2016   Weakness 10/06/2016   Hypercholesteremia 10/06/2016   Gout 10/06/2016   Diabetes mellitus without complication (HCC) 10/06/2016   Viral syndrome 10/06/2016   Precordial chest pain 09/11/2016   Diabetes (HCC) 05/03/2016   HYPOKALEMIA 11/01/2008   HYPERGLYCEMIA, FASTING 11/01/2008   Type 2 diabetes mellitus with hyperlipidemia (HCC) 10/17/2008   GOUT, UNSPECIFIED 10/17/2008   OBESITY 10/17/2008   MIGRAINE HEADACHE 10/17/2008   Essential hypertension 10/17/2008   INTERNAL HEMORRHOIDS 10/17/2008   DIVERTICULOSIS, COLON 10/17/2008   ARTHRITIS 10/17/2008   Thank you,  Havery Moros, CCC-SLP [phone]  [name] 03/26/2021, 11:44 AM  [facility] [address] [place], [place], [address] Phone: [phone]   Fax:  [phone]   Name: [name] MRN: [id number] Date of Birth: Jun 27, 1940

## 2021-03-30 ENCOUNTER — Ambulatory Visit (HOSPITAL_COMMUNITY): Payer: Medicare HMO | Admitting: Speech Pathology

## 2021-03-30 ENCOUNTER — Encounter (HOSPITAL_COMMUNITY): Payer: Self-pay | Admitting: Speech Pathology

## 2021-03-30 ENCOUNTER — Other Ambulatory Visit: Payer: Self-pay

## 2021-03-30 DIAGNOSIS — R1312 Dysphagia, oropharyngeal phase: Secondary | ICD-10-CM

## 2021-03-30 DIAGNOSIS — R471 Dysarthria and anarthria: Secondary | ICD-10-CM | POA: Diagnosis not present

## 2021-03-30 NOTE — Therapy (Signed)
[facility] [address] [place], [place], [address] Phone: [phone]   Fax:  [phone]  Speech Language Pathology Treatment  Patient Details  Name: [name] MRN: [id number] Date of Birth: [birth year] Referring Provider (SLP): Shawnie Dapper, NP, neurology   Encounter Date: 03/30/2021   End of Session - 03/30/21 1047     Visit Number 5    Number of Visits 13    Date for SLP Re-Evaluation 05/07/21    Authorization Type [employer] H[employer]    SLP Start Time 1042    SLP Stop Time  1120    SLP Time Calculation (min) 38 min    Activity Tolerance Patient tolerated treatment well             Past Medical History:  Diagnosis Date   Aortic atherosclerosis (HCC) 09/11/2016   noted on CXR   Arthritis    Baker's cyst, left 2014   Small, Left   Bilateral leg numbness    Cataract    Bilateral   Diabetes mellitus without complication (HCC)    Diverticulosis 10/09/2016   Noted on CT abd/pelvis   Gout    Grade I diastolic dysfunction 09/12/2016   Noted on ECHO   Hepatic cyst 10/09/2016   Noted on CT abd/pelvis   History of iron deficiency anemia    History of migraine    Hypercholesteremia    Hypertension    Internal hemorrhoids    Lateral meniscal tear 2014   Left   LVH (left ventricular hypertrophy) 03/03/2018   noted on EKG   Obesity    PMB (postmenopausal bleeding)    Pneumonia 2018   Right upper lower   PONV (postoperative nausea and vomiting)    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   Right 1.3 cm, Noted on CT Chest, pt unaware   Renal cyst 10/09/2016   Noted on CT abd/pelvis   Thoracic aortic aneurysm 12/09/2016   ascending 4.0 cm, noted on CT chest, pt unaware    Past Surgical History:  Procedure Laterality Date   ABDOMINAL HYSTERECTOMY     CATARACT EXTRACTION Right 09/2010   CATARACT EXTRACTION Left 10/2010   COLONOSCOPY     DILATION AND CURETTAGE OF UTERUS     TOTAL KNEE ARTHROPLASTY Right  04/21/2018   Procedure: TOTAL KNEE ARTHROPLASTY;  Surgeon: Frederico Hamman, MD;  Location: [facility];  Service: Orthopedics;  Laterality: Right;  Adductor Block    There were no vitals filed for this visit.   Subjective Assessment - 03/30/21 1047     Subjective "I did a lesson on the phone yesterday."    Currently in Pain? No/denies             03/30/21 1105  General Information  Behavior/Cognition Alert;Cooperative;Pleasant mood  Patient Positioning Upright in chair  Oral care provided N/A  HPI [name] is an 80 yo female who was referred by Shawnie Dapper, NP (neurology) for SLP evaluation and treatment for dysarthria and dysphagia in setting of Parkinson's disease (diagnosed in 2021). She takes generic Sinemet 1 tablet QID. She uses a Museum/gallery exhibitions officer for ambulation. She reported changes to her speech and swallowing during her last visit with neurology (coughing with liquids).  Treatment Provided  Treatment provided Cognitive-Linquistic  Pain Assessment  Pain Assessment No/denies pain  Cognitive-Linquistic Treatment  Treatment focused on Dysarthria;Patient/family/caregiver education  Skilled Treatment Speakout! treatment protocol, cycles of SLP guided warm up, sustained phonation, glides, counting, reading, and cognitive speech tasks. SLP provided  feedback and cues to maximize accuracy/effort.  Assessment / Recommendations / Plan  Plan Continue with current plan of care  Progression Toward Goals  Progression toward goals Progressing toward goals     SLP Short Term Goals - 03/30/21 1056       SLP SHORT TERM GOAL #1   Title Pt will coordinate vocal and articulatory subsystems in hierarchical speech tasks by producing sounds with intention with min assistance.    Baseline mod assist    Time 6    Period Weeks    Status On-going    Target Date 05/06/21      SLP SHORT TERM GOAL #2   Title Read phrases, sentences, and paragraphs with intention, yielding improved vocal quality,  loudness, articulatory precision, and endurance while maintaining a minimum of 85 dB with min assistance.    Baseline mod assist    Time 6    Period Weeks    Status On-going    Target Date 05/06/21      SLP SHORT TERM GOAL #3   Title Generalize intentional speech to cognitive-linguistic exercises and conversational speech with improved vocal quality, loudness, articulatory precision, and endurance while maintaining a minimum of 85 dB with min    Baseline mod assist    Time 6    Period Weeks    Status On-going    Target Date 05/06/21              SLP Long Term Goals - 03/30/21 1057       SLP LONG TERM GOAL #1   Title Same as short              Plan - 03/30/21 1055     Clinical Impression Statement Pt was accompanied to therapy today by her sister. Pt completed lesson 9 in workbook with min/mod cues from SLP. Pt produced m-cycle with ~92 dB, /a/ with ~95 dB, glides with ~92 dB, counting with ~91 dB, oral reading with 90 dB and cognitive tasks with 85 dB. Pt was noted to speak with intent outside of the treatment room before and after our session. She went to church for almost 8 hours yesterday and endorsed using "intent" while there. Continue plan of care.    Speech Therapy Frequency 2x / week    Treatment/Interventions Compensatory strategies;Patient/family education;Cueing hierarchy;SLP instruction and feedback;Pharyngeal strengthening exercises    Potential to Achieve Goals Good    SLP Home Exercise Plan Pt will completed HEP as assigned to facilitate carryover of treatment strategies and techniques in home environment with use of written cues as needed.    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   Acute encephalopathy 04/22/2018   Bradypnea    Primary localized osteoarthritis of right knee 02/10/2018   Acute viral syndrome 10/06/2016   Weakness 10/06/2016   Hypercholesteremia 10/06/2016   Gout 10/06/2016   Diabetes mellitus without complication (HCC) 10/06/2016   Viral syndrome 10/06/2016   Precordial chest pain 09/11/2016   Diabetes (HCC) 05/03/2016   HYPOKALEMIA 11/01/2008   HYPERGLYCEMIA, FASTING 11/01/2008   Type 2 diabetes mellitus with hyperlipidemia (HCC)  10/17/2008   GOUT, UNSPECIFIED 10/17/2008   OBESITY 10/17/2008   MIGRAINE HEADACHE 10/17/2008   Essential hypertension 10/17/2008   INTERNAL HEMORRHOIDS 10/17/2008   DIVERTICULOSIS, COLON 10/17/2008   ARTHRITIS 10/17/2008   Thank you,  Havery Moros, CCC-SLP [phone]  Lillyth Spong 03/30/2021, 11:07 AM  [facility] [address] [place], [place], [address] Phone: [phone]   Fax:  [phone]   Name: RICKY DOAN MRN: [id number] Date of Birth: 1940-12-21

## 2021-04-02 ENCOUNTER — Ambulatory Visit (HOSPITAL_COMMUNITY): Payer: Medicare HMO | Admitting: Speech Pathology

## 2021-04-02 ENCOUNTER — Encounter (HOSPITAL_COMMUNITY): Payer: Self-pay | Admitting: Speech Pathology

## 2021-04-02 ENCOUNTER — Other Ambulatory Visit: Payer: Self-pay

## 2021-04-02 DIAGNOSIS — R471 Dysarthria and anarthria: Secondary | ICD-10-CM | POA: Diagnosis not present

## 2021-04-02 DIAGNOSIS — R1312 Dysphagia, oropharyngeal phase: Secondary | ICD-10-CM | POA: Diagnosis not present

## 2021-04-02 NOTE — Therapy (Signed)
[facility] [address] [place], [place], [address] Phone: [phone]   Fax:  [phone]  Speech Language Pathology Treatment  Patient Details  Name: [name] MRN: [id number] Date of Birth: [birth year] Referring Provider (SLP): Shawnie Dapper, NP, neurology   Encounter Date: 04/02/2021   End of Session - 04/02/21 1105     Visit Number 6    Number of Visits 13    Date for SLP Re-Evaluation 05/07/21    Authorization Type [employer] H[employer]    SLP Start Time 1042    SLP Stop Time  1120    SLP Time Calculation (min) 38 min    Activity Tolerance Patient tolerated treatment well             Past Medical History:  Diagnosis Date   Aortic atherosclerosis (HCC) 09/11/2016   noted on CXR   Arthritis    Baker's cyst, left 2014   Small, Left   Bilateral leg numbness    Cataract    Bilateral   Diabetes mellitus without complication (HCC)    Diverticulosis 10/09/2016   Noted on CT abd/pelvis   Gout    Grade I diastolic dysfunction 09/12/2016   Noted on ECHO   Hepatic cyst 10/09/2016   Noted on CT abd/pelvis   History of iron deficiency anemia    History of migraine    Hypercholesteremia    Hypertension    Internal hemorrhoids    Lateral meniscal tear 2014   Left   LVH (left ventricular hypertrophy) 03/03/2018   noted on EKG   Obesity    PMB (postmenopausal bleeding)    Pneumonia 2018   Right upper lower   PONV (postoperative nausea and vomiting)    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   Right 1.3 cm, Noted on CT Chest, pt unaware   Renal cyst 10/09/2016   Noted on CT abd/pelvis   Thoracic aortic aneurysm 12/09/2016   ascending 4.0 cm, noted on CT chest, pt unaware    Past Surgical History:  Procedure Laterality Date   ABDOMINAL HYSTERECTOMY     CATARACT EXTRACTION Right 09/2010   CATARACT EXTRACTION Left 10/2010   COLONOSCOPY     DILATION AND CURETTAGE OF UTERUS     TOTAL KNEE ARTHROPLASTY Right  04/21/2018   Procedure: TOTAL KNEE ARTHROPLASTY;  Surgeon: Frederico Hamman, MD;  Location: [facility];  Service: Orthopedics;  Laterality: Right;  Adductor Block    There were no vitals filed for this visit.   Subjective Assessment - 04/02/21 1057     Subjective "I have a hard time with the glide."    Patient is accompained by: Family member    Currently in Pain? No/denies              ADULT SLP TREATMENT - 04/02/21 1101       General Information   Behavior/Cognition Alert;Cooperative;Pleasant mood    Patient Positioning Upright in chair    Oral care provided N/A    HPI [name] is an 80 yo female who was referred by Shawnie Dapper, NP (neurology) for SLP evaluation and treatment for dysarthria and dysphagia in setting of Parkinson's disease (diagnosed in 2021). She takes generic Sinemet 1 tablet QID. She uses a Museum/gallery exhibitions officer for ambulation. She reported changes to her speech and swallowing during her last visit with neurology (coughing with liquids).      Treatment Provided   Treatment provided Cognitive-Linquistic      Pain Assessment  Pain Assessment No/denies pain      Cognitive-Linquistic Treatment   Treatment focused on Dysarthria;Patient/family/caregiver education    Skilled Treatment Speakout! treatment protocol, cycles of SLP guided warm up, sustained phonation, glides, counting, reading, and cognitive speech tasks. SLP provided feedback and cues to maximize accuracy/effort.      Assessment / Recommendations / Plan   Plan Continue with current plan of care      Progression Toward Goals   Progression toward goals Progressing toward goals              SLP Education - 04/02/21 1104     Education Details SPEAKOUT! lessons 10+    Person(s) Educated Patient    Methods Explanation;Handout    Comprehension Verbalized understanding              SLP Short Term Goals - 04/02/21 1112       SLP SHORT TERM GOAL #1   Title Pt will coordinate vocal and articulatory  subsystems in hierarchical speech tasks by producing sounds with intention with min assistance.    Baseline mod assist    Time 6    Period Weeks    Status On-going    Target Date 05/06/21      SLP SHORT TERM GOAL #2   Title Read phrases, sentences, and paragraphs with intention, yielding improved vocal quality, loudness, articulatory precision, and endurance while maintaining a minimum of 85 dB with min assistance.    Baseline mod assist    Time 6    Period Weeks    Status On-going    Target Date 05/06/21      SLP SHORT TERM GOAL #3   Title Generalize intentional speech to cognitive-linguistic exercises and conversational speech with improved vocal quality, loudness, articulatory precision, and endurance while maintaining a minimum of 85 dB with min    Baseline mod assist    Time 6    Period Weeks    Status On-going    Target Date 05/06/21              SLP Long Term Goals - 03/30/21 1057       SLP LONG TERM GOAL #1   Title Same as short              Plan - 04/02/21 1112     Clinical Impression Statement Pt was accompanied to therapy today by her sister. Pt completed lesson 10 in workbook with min cues from SLP. Pt produced m-cycle with ~92 dB, /a/ with ~95 dB, glides with ~92 dB, counting with ~91 dB, oral reading with 92 dB and cognitive tasks with 87 dB. Pt was noted to speak with intent outside of the treatment room before and after our session. Pt did an excellent job of speaking with intent when completing picture description tasks, but needed some cues for use of word finding strategies. Her sister reports that they practiced the exercises on the phone together. SLP made Pt an index card packet "cheat sheet" to practice when she is on the go. Continue plan of care.    Speech Therapy Frequency 2x / week    Treatment/Interventions Compensatory strategies;Patient/family education;Cueing hierarchy;SLP instruction and feedback;Pharyngeal strengthening exercises     Potential to Achieve Goals Good    SLP Home Exercise Plan Pt will completed HEP as assigned to facilitate carryover of treatment strategies and techniques in home environment with use of written cues as needed.    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   Acute encephalopathy 04/22/2018   Bradypnea    Primary localized osteoarthritis of right knee 02/10/2018   Acute viral syndrome 10/06/2016   Weakness 10/06/2016   Hypercholesteremia 10/06/2016   Gout 10/06/2016   Diabetes mellitus without complication (HCC) 10/06/2016   Viral syndrome 10/06/2016   Precordial chest pain 09/11/2016   Diabetes (HCC) 05/03/2016   HYPOKALEMIA 11/01/2008   HYPERGLYCEMIA, FASTING 11/01/2008   Type 2 diabetes mellitus with hyperlipidemia (HCC) 10/17/2008   GOUT, UNSPECIFIED 10/17/2008   OBESITY 10/17/2008   MIGRAINE HEADACHE 10/17/2008   Essential hypertension 10/17/2008   INTERNAL HEMORRHOIDS 10/17/2008   DIVERTICULOSIS, COLON 10/17/2008   ARTHRITIS 10/17/2008   Thank you,  Havery Moros, CCC-SLP [phone]  [name] 04/02/2021, 11:16 AM  [facility] [address] [place], [place], [address] Phone: [phone]   Fax:  [phone]   Name: [name] MRN: [id number] Date of Birth: 12/25/1940

## 2021-04-06 ENCOUNTER — Other Ambulatory Visit: Payer: Self-pay

## 2021-04-06 ENCOUNTER — Encounter (HOSPITAL_COMMUNITY): Payer: Self-pay | Admitting: Speech Pathology

## 2021-04-06 ENCOUNTER — Ambulatory Visit (HOSPITAL_COMMUNITY): Payer: Medicare HMO | Admitting: Speech Pathology

## 2021-04-06 DIAGNOSIS — R471 Dysarthria and anarthria: Secondary | ICD-10-CM

## 2021-04-06 DIAGNOSIS — R1312 Dysphagia, oropharyngeal phase: Secondary | ICD-10-CM | POA: Diagnosis not present

## 2021-04-06 NOTE — Therapy (Signed)
[place], [place], [address] Phone: [phone]   Fax:  [phone]  Speech Language Pathology Treatment  Patient Details  Name: [name] MRN: [id number] Date of Birth: 1940-09-04 Referring Provider (SLP): Debbora Presto, NP, neurology   Encounter Date: 04/06/2021   End of Session - 04/06/21 1231     Visit Number 7    Number of Visits 13    Date for SLP Re-Evaluation 05/07/21    Authorization Type [employer] H[employer]    SLP Start Time 1042    SLP Stop Time  [id number]    SLP Time Calculation (min) 46 min    Activity Tolerance Patient tolerated treatment well             Past Medical History:  Diagnosis Date   Aortic atherosclerosis ([place]) 09/11/2016   noted on CXR   Arthritis    Baker's cyst, left 2014   Small, Left   Bilateral leg numbness    Cataract    Bilateral   Diabetes mellitus without complication ([place])    Diverticulosis 10/09/2016   Noted on CT abd/pelvis   Gout    Grade I diastolic dysfunction 0000000   Noted on ECHO   Hepatic cyst 10/09/2016   Noted on CT abd/pelvis   History of iron deficiency anemia    History of migraine    Hypercholesteremia    Hypertension    Internal hemorrhoids    Lateral meniscal tear 2014   Left   LVH (left ventricular hypertrophy) 03/03/2018   noted on EKG   Obesity    PMB (postmenopausal bleeding)    Pneumonia 2018   Right upper lower   PONV (postoperative nausea and vomiting)    Renal arterial aneurysm ([place]) 12/09/2016   Right 1.3 cm, Noted on CT Chest, pt unaware   Renal cyst 10/09/2016   Noted on CT abd/pelvis   Thoracic aortic aneurysm 12/09/2016   ascending 4.0 cm, noted on CT chest, pt unaware    Past Surgical History:  Procedure Laterality Date   ABDOMINAL HYSTERECTOMY     CATARACT EXTRACTION Right 09/2010   CATARACT EXTRACTION Left 10/2010   COLONOSCOPY     DILATION AND CURETTAGE OF UTERUS     TOTAL KNEE ARTHROPLASTY Right  04/21/2018   Procedure: TOTAL KNEE ARTHROPLASTY;  Surgeon: Earlie Server, MD;  Location: [facility];  Service: Orthopedics;  Laterality: Right;  Adductor Block    There were no vitals filed for this visit.   Subjective Assessment - 04/06/21 1202     Subjective "This is my niece."    Patient is accompained by: Family member    Currently in Pain? No/denies                   ADULT SLP TREATMENT - 04/06/21 1219       General Information   Behavior/Cognition Alert;Cooperative;Pleasant mood    Patient Positioning Upright in chair    Oral care provided N/A    HPI [name] is an 80 yo female who was referred by Debbora Presto, NP (neurology) for SLP evaluation and treatment for dysarthria and dysphagia in setting of Parkinson's disease (diagnosed in 2021). She takes generic Sinemet 1 tablet QID. She uses a Corporate investment banker for ambulation. She reported changes to her speech and swallowing during her last visit with neurology (coughing with liquids).      Treatment Provided   Treatment provided Cognitive-Linquistic      Pain Assessment  Pain Assessment No/denies pain      Cognitive-Linquistic Treatment   Treatment focused on Dysarthria;Patient/family/caregiver education    Skilled Treatment Speakout! treatment protocol, cycles of SLP guided warm up, sustained phonation, glides, counting, reading, and cognitive speech tasks. SLP provided feedback and cues to maximize accuracy/effort.      Assessment / Recommendations / Plan   Plan Continue with current plan of care      Progression Toward Goals   Progression toward goals Progressing toward goals              SLP Education - 04/06/21 1229     Education Details SPEAKOUT! Lesson 12+    Person(s) Educated Patient    Methods Explanation    Comprehension Verbalized understanding              SLP Short Term Goals - 04/06/21 1256       SLP SHORT TERM GOAL #1   Title Pt will coordinate vocal and articulatory subsystems in  hierarchical speech tasks by producing sounds with intention with min assistance.    Baseline mod assist    Time 6    Period Weeks    Status On-going    Target Date 05/06/21      SLP SHORT TERM GOAL #2   Title Read phrases, sentences, and paragraphs with intention, yielding improved vocal quality, loudness, articulatory precision, and endurance while maintaining a minimum of 85 dB with min assistance.    Baseline mod assist    Time 6    Period Weeks    Status On-going    Target Date 05/06/21      SLP SHORT TERM GOAL #3   Title Generalize intentional speech to cognitive-linguistic exercises and conversational speech with improved vocal quality, loudness, articulatory precision, and endurance while maintaining a minimum of 85 dB with min    Baseline mod assist    Time 6    Period Weeks    Status On-going    Target Date 05/06/21              SLP Long Term Goals - 03/30/21 1057       SLP LONG TERM GOAL #1   Title Same as short              Plan - 04/06/21 1231     Clinical Impression Statement Pt was accompanied to therapy today by her niece. Her niece expressed excitement about the progress she has seen at church with her aunt. She is able to speak loudly and clearly to the entire church. Pt completed lesson 12 in workbook with min cues from SLP. Pt produced m-cycle with ~90 dB, /a/ with ~93 dB, glides with ~92 dB, counting with ~89 dB, oral reading with 84 dB and cognitive tasks with 82 dB. In spontaneous conversation, Pt's loudness fluctuated between 69-78 dB. SLP provided strategies for word finding and category generation and Pt implemented in functional tasks with mod cues. Pt was noted to speak with intent outside of the treatment room before and after our session. She inquired about excess saliva in the mornings. SLP encouraged her to swallow more frequently. She will need to cue herself to swallow every 30 seconds or so. Pt continues to be motivated, make progress, and  have excellent family support. Continue plan of care.    Speech Therapy Frequency 2x / week    Treatment/Interventions Compensatory strategies;Patient/family education;Cueing hierarchy;SLP instruction and feedback;Pharyngeal strengthening exercises    Potential to Achieve Goals Good  SLP Home Exercise Plan Pt will completed HEP as assigned to facilitate carryover of treatment strategies and techniques in home environment with use of written cues as needed.    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   Acute encephalopathy 04/22/2018   Bradypnea    Primary localized osteoarthritis of right knee 02/10/2018   Acute viral syndrome 10/06/2016   Weakness 10/06/2016   Hypercholesteremia 10/06/2016   Gout 10/06/2016   Diabetes mellitus without complication ([place]) [id number]   Viral syndrome 10/06/2016   Precordial chest pain 09/11/2016   Diabetes ([place]) 05/03/2016   HYPOKALEMIA 11/01/2008   HYPERGLYCEMIA, FASTING 11/01/2008   Type 2 diabetes mellitus with hyperlipidemia ([place]) 10/17/2008   GOUT, UNSPECIFIED 10/17/2008   OBESITY 10/17/2008   MIGRAINE HEADACHE 10/17/2008   Essential hypertension 10/17/2008   INTERNAL HEMORRHOIDS 10/17/2008   DIVERTICULOSIS, COLON 10/17/2008   ARTHRITIS 10/17/2008   Thank you,  Genene Churn, [place]  Chaye Misch 04/06/2021, 12:56 PM  [place], [place], [address] Phone: [phone]   Fax:  [phone]   Name: KAYLANI LANE MRN: [id number] Date of Birth: [birth year]

## 2021-04-09 ENCOUNTER — Ambulatory Visit (HOSPITAL_COMMUNITY): Payer: Medicare HMO | Attending: Family Medicine | Admitting: Speech Pathology

## 2021-04-09 ENCOUNTER — Encounter (HOSPITAL_COMMUNITY): Payer: Self-pay | Admitting: Speech Pathology

## 2021-04-09 ENCOUNTER — Ambulatory Visit (HOSPITAL_COMMUNITY): Payer: Medicare HMO | Admitting: Speech Pathology

## 2021-04-09 ENCOUNTER — Other Ambulatory Visit: Payer: Self-pay

## 2021-04-09 DIAGNOSIS — R471 Dysarthria and anarthria: Secondary | ICD-10-CM | POA: Insufficient documentation

## 2021-04-09 NOTE — Therapy (Signed)
[facility] [address] [place], [place], [address] Phone: [phone]   Fax:  [phone]  Speech Language Pathology Treatment  Patient Details  Name: [name] MRN: [id number] Date of Birth: Oct 29, 1940 Referring Provider (SLP): Shawnie Dapper, NP, neurology   Encounter Date: 04/09/2021   End of Session - 04/09/21 1607     Visit Number 8    Number of Visits 13    Date for SLP Re-Evaluation 05/07/21    Authorization Type [employer] HMO    SLP Start Time 1034    SLP Stop Time  1120    SLP Time Calculation (min) 46 min    Activity Tolerance Patient tolerated treatment well             Past Medical History:  Diagnosis Date   Aortic atherosclerosis (HCC) 09/11/2016   noted on CXR   Arthritis    Baker's cyst, left 2014   Small, Left   Bilateral leg numbness    Cataract    Bilateral   Diabetes mellitus without complication (HCC)    Diverticulosis 10/09/2016   Noted on CT abd/pelvis   Gout    Grade I diastolic dysfunction 09/12/2016   Noted on ECHO   Hepatic cyst 10/09/2016   Noted on CT abd/pelvis   History of iron deficiency anemia    History of migraine    Hypercholesteremia    Hypertension    Internal hemorrhoids    Lateral meniscal tear 2014   Left   LVH (left ventricular hypertrophy) 03/03/2018   noted on EKG   Obesity    PMB (postmenopausal bleeding)    Pneumonia 2018   Right upper lower   PONV (postoperative nausea and vomiting)    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   Right 1.3 cm, Noted on CT Chest, pt unaware   Renal cyst 10/09/2016   Noted on CT abd/pelvis   Thoracic aortic aneurysm 12/09/2016   ascending 4.0 cm, noted on CT chest, pt unaware    Past Surgical History:  Procedure Laterality Date   ABDOMINAL HYSTERECTOMY     CATARACT EXTRACTION Right 09/2010   CATARACT EXTRACTION Left 10/2010   COLONOSCOPY     DILATION AND CURETTAGE OF UTERUS     TOTAL KNEE ARTHROPLASTY Right  04/21/2018   Procedure: TOTAL KNEE ARTHROPLASTY;  Surgeon: Frederico Hamman, MD;  Location: [facility];  Service: Orthopedics;  Laterality: Right;  Adductor Block    There were no vitals filed for this visit.   Subjective Assessment - 04/09/21 1043     Subjective "I did some on my phone."    Currently in Pain? No/denies                   ADULT SLP TREATMENT - 04/09/21 1606       General Information   Behavior/Cognition Alert;Cooperative;Pleasant mood    Patient Positioning Upright in chair    Oral care provided N/A    HPI [name] is an 80 yo female who was referred by Shawnie Dapper, NP (neurology) for SLP evaluation and treatment for dysarthria and dysphagia in setting of Parkinson's disease (diagnosed in 2021). She takes generic Sinemet 1 tablet QID. She uses a Museum/gallery exhibitions officer for ambulation. She reported changes to her speech and swallowing during her last visit with neurology (coughing with liquids).      Treatment Provided   Treatment provided Cognitive-Linquistic      Pain Assessment   Pain Assessment No/denies pain  Cognitive-Linquistic Treatment   Treatment focused on Dysarthria;Patient/family/caregiver education    Skilled Treatment Speakout! treatment protocol, cycles of SLP guided warm up, sustained phonation, glides, counting, reading, and cognitive speech tasks. SLP provided feedback and cues to maximize accuracy/effort.      Assessment / Recommendations / Plan   Plan Continue with current plan of care      Progression Toward Goals   Progression toward goals Progressing toward goals                SLP Short Term Goals - 04/09/21 1610       SLP SHORT TERM GOAL #1   Title Pt will coordinate vocal and articulatory subsystems in hierarchical speech tasks by producing sounds with intention with min assistance.    Baseline mod assist    Time 6    Period Weeks    Status On-going    Target Date 05/06/21      SLP SHORT TERM GOAL #2   Title Read phrases,  sentences, and paragraphs with intention, yielding improved vocal quality, loudness, articulatory precision, and endurance while maintaining a minimum of 85 dB with min assistance.    Baseline mod assist    Time 6    Period Weeks    Status On-going    Target Date 05/06/21      SLP SHORT TERM GOAL #3   Title Generalize intentional speech to cognitive-linguistic exercises and conversational speech with improved vocal quality, loudness, articulatory precision, and endurance while maintaining a minimum of 85 dB with min    Baseline mod assist    Time 6    Period Weeks    Status On-going    Target Date 05/06/21              SLP Long Term Goals - 03/30/21 1057       SLP LONG TERM GOAL #1   Title Same as short              Plan - 04/09/21 1607     Clinical Impression Statement Pt was accompanied to therapy today by her husband. Pt completed lesson 13 in workbook with min cues from SLP. Pt produced m-cycle with ~88 dB, /a/ with ~92 dB, glides with ~92 dB, counting with ~89 dB, oral reading with 89 dB and cognitive tasks with 84 dB. In spontaneous conversation, Pt's loudness fluctuated between 69-78 dB. Pt continues to be motivated, make progress, and have excellent family support. Continue plan of care.    Speech Therapy Frequency 2x / week    Treatment/Interventions Compensatory strategies;Patient/family education;Cueing hierarchy;SLP instruction and feedback;Pharyngeal strengthening exercises    Potential to Achieve Goals Good    SLP Home Exercise Plan Pt will completed HEP as assigned to facilitate carryover of treatment strategies and techniques in home environment with use of written cues as needed.    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   Acute encephalopathy 04/22/2018   Bradypnea    Primary localized osteoarthritis of right knee 02/10/2018   Acute viral syndrome 10/06/2016   Weakness 10/06/2016   Hypercholesteremia 10/06/2016   Gout 10/06/2016  Diabetes mellitus without complication (HCC) 10/06/2016   Viral syndrome 10/06/2016   Precordial chest pain 09/11/2016   Diabetes (HCC) 05/03/2016   HYPOKALEMIA 11/01/2008   HYPERGLYCEMIA, FASTING 11/01/2008   Type 2 diabetes mellitus with hyperlipidemia (HCC) 10/17/2008   GOUT, UNSPECIFIED 10/17/2008   OBESITY 10/17/2008   MIGRAINE HEADACHE 10/17/2008   Essential hypertension 10/17/2008   INTERNAL HEMORRHOIDS 10/17/2008   DIVERTICULOSIS, COLON 10/17/2008   ARTHRITIS 10/17/2008   Thank you,  Havery Moros, CCC-SLP [phone]  William Laske 04/09/2021, 4:11 PM  [facility] [address] [place], [place], [address] Phone: [phone]   Fax:  [phone]   Name: [name] MRN: [id number] Date of Birth: 22-Jan-1941

## 2021-04-22 ENCOUNTER — Ambulatory Visit (HOSPITAL_COMMUNITY): Payer: Medicare HMO | Admitting: Speech Pathology

## 2021-04-22 ENCOUNTER — Encounter (HOSPITAL_COMMUNITY): Payer: Self-pay | Admitting: Speech Pathology

## 2021-04-22 ENCOUNTER — Other Ambulatory Visit: Payer: Self-pay

## 2021-04-22 DIAGNOSIS — R471 Dysarthria and anarthria: Secondary | ICD-10-CM | POA: Diagnosis not present

## 2021-04-22 NOTE — Therapy (Signed)
[facility] [address] [place], [place], [address] Phone: [phone]   Fax:  [phone]  Speech Language Pathology Treatment  Patient Details  Name: [name] MRN: [id number] Date of Birth: 1940-11-07 Referring Provider (SLP): Shawnie Dapper, NP, neurology   Encounter Date: 04/22/2021   End of Session - 04/22/21 1045     Visit Number 9    Number of Visits 13    Date for SLP Re-Evaluation 05/07/21    Authorization Type [employer] HMO    SLP Start Time 1032    SLP Stop Time  1117    SLP Time Calculation (min) 45 min    Activity Tolerance Patient tolerated treatment well             Past Medical History:  Diagnosis Date   Aortic atherosclerosis (HCC) 09/11/2016   noted on CXR   Arthritis    Baker's cyst, left 2014   Small, Left   Bilateral leg numbness    Cataract    Bilateral   Diabetes mellitus without complication (HCC)    Diverticulosis 10/09/2016   Noted on CT abd/pelvis   Gout    Grade I diastolic dysfunction 09/12/2016   Noted on ECHO   Hepatic cyst 10/09/2016   Noted on CT abd/pelvis   History of iron deficiency anemia    History of migraine    Hypercholesteremia    Hypertension    Internal hemorrhoids    Lateral meniscal tear 2014   Left   LVH (left ventricular hypertrophy) 03/03/2018   noted on EKG   Obesity    PMB (postmenopausal bleeding)    Pneumonia 2018   Right upper lower   PONV (postoperative nausea and vomiting)    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   Right 1.3 cm, Noted on CT Chest, pt unaware   Renal cyst 10/09/2016   Noted on CT abd/pelvis   Thoracic aortic aneurysm 12/09/2016   ascending 4.0 cm, noted on CT chest, pt unaware    Past Surgical History:  Procedure Laterality Date   ABDOMINAL HYSTERECTOMY     CATARACT EXTRACTION Right 09/2010   CATARACT EXTRACTION Left 10/2010   COLONOSCOPY     DILATION AND CURETTAGE OF UTERUS     TOTAL KNEE ARTHROPLASTY Right  04/21/2018   Procedure: TOTAL KNEE ARTHROPLASTY;  Surgeon: Frederico Hamman, MD;  Location: [facility];  Service: Orthopedics;  Laterality: Right;  Adductor Block    There were no vitals filed for this visit.   Subjective Assessment - 04/22/21 1035     Subjective "I am doing fine."    Currently in Pain? 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 She takes generic Sinemet 1 tablet QID. She uses a Museum/gallery exhibitions officer for ambulation. She reported changes to her speech and swallowing during her last visit with neurology (coughing with liquids).      Treatment Provided   Treatment provided Cognitive-Linquistic      Pain Assessment   Pain Assessment No/denies pain      Cognitive-Linquistic Treatment  Treatment focused on Dysarthria;Patient/family/caregiver education    Skilled Treatment Speakout! treatment protocol, cycles of SLP guided warm up, sustained phonation, glides, counting, reading, and cognitive speech tasks. SLP provided feedback and cues to maximize accuracy/effort.      Progression Toward Goals   Progression toward goals Progressing toward goals              SLP Education - 04/22/21 1044     Education Details SPEAKOUT! Lessson 14+    Person(s) Educated Patient;Spouse    Methods Handout    Comprehension Verbalized understanding              SLP Short Term Goals - 04/22/21 1052       SLP SHORT TERM GOAL #1   Title Pt will coordinate vocal and articulatory subsystems in hierarchical speech tasks by producing sounds with intention with min assistance.    Baseline mod assist    Time 6    Period Weeks     Status On-going    Target Date 05/06/21      SLP SHORT TERM GOAL #2   Title Read phrases, sentences, and paragraphs with intention, yielding improved vocal quality, loudness, articulatory precision, and endurance while maintaining a minimum of 85 dB with min assistance.    Baseline mod assist    Time 6    Period Weeks    Status On-going    Target Date 05/06/21      SLP SHORT TERM GOAL #3   Title Generalize intentional speech to cognitive-linguistic exercises and conversational speech with improved vocal quality, loudness, articulatory precision, and endurance while maintaining a minimum of 85 dB with min    Baseline mod assist    Time 6    Period Weeks    Status On-going    Target Date 05/06/21              SLP Long Term Goals - 04/22/21 1052       SLP LONG TERM GOAL #1   Title Same as short              Plan - 04/22/21 1050     Clinical Impression Statement Pt was accompanied to therapy today by her husband. Pt completed lesson 14 in workbook with min cues from SLP. Pt produced m-cycle with ~89 dB, /a/ with ~88 dB, glides with ~87 dB, counting with ~92 dB, oral reading with 85 dB and cognitive tasks with 81 dB. In spontaneous conversation, Pt's loudness fluctuated between 69-76 dB. Pt continues to be motivated, make progress, and have excellent family support. Continue plan of care.    Speech Therapy Frequency 1x /week    Duration 2 weeks    Treatment/Interventions Compensatory strategies;Patient/family education;Cueing hierarchy;SLP instruction and feedback;Pharyngeal strengthening exercises    Potential to Achieve Goals Good    SLP Home Exercise Plan Pt will completed HEP as assigned to facilitate carryover of treatment strategies and techniques in home environment with use of written cues as needed.    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   Acute encephalopathy 04/22/2018   Bradypnea    Primary localized osteoarthritis  of right knee 02/10/2018   Acute viral syndrome 10/06/2016   Weakness 10/06/2016   Hypercholesteremia 10/06/2016   Gout 10/06/2016   Diabetes mellitus without complication ([place]) [id number]   Viral syndrome 10/06/2016   Precordial chest pain 09/11/2016   Diabetes ([place]) 05/03/2016   HYPOKALEMIA 11/01/2008   HYPERGLYCEMIA, FASTING 11/01/2008   Type 2 diabetes mellitus with hyperlipidemia ([place]) 10/17/2008   GOUT, UNSPECIFIED 10/17/2008   OBESITY 10/17/2008   MIGRAINE HEADACHE 10/17/2008   Essential hypertension 10/17/2008   INTERNAL HEMORRHOIDS 10/17/2008   DIVERTICULOSIS, COLON 10/17/2008   ARTHRITIS 10/17/2008   Thank you,  Genene Churn, [place]  Gerlene Glassburn 04/22/2021, 10:54 AM  [place], [place], [address] Phone: [phone]   Fax:  [phone]   Name: LOUISE COONAN MRN: [id number] Date of Birth: [birth year]

## 2021-04-27 ENCOUNTER — Other Ambulatory Visit: Payer: Self-pay

## 2021-04-27 ENCOUNTER — Ambulatory Visit (HOSPITAL_COMMUNITY): Payer: Medicare HMO | Admitting: Speech Pathology

## 2021-04-27 ENCOUNTER — Encounter (HOSPITAL_COMMUNITY): Payer: Self-pay | Admitting: Speech Pathology

## 2021-04-27 DIAGNOSIS — R471 Dysarthria and anarthria: Secondary | ICD-10-CM

## 2021-04-27 NOTE — Therapy (Signed)
[facility] [address] [place], [place], [address] Phone: [phone]   Fax:  [phone]  Speech Language Pathology Treatment  Patient Details  Name: [name] MRN: [id number] Date of Birth: 05/24/1941 Referring Provider (SLP): Shawnie Dapper, NP, neurology   Encounter Date: 04/27/2021   End of Session - 04/27/21 1139     Visit Number 10    Number of Visits 13    Date for SLP Re-Evaluation 05/07/21    Authorization Type [employer] HMO    SLP Start Time 1130    SLP Stop Time  1215    SLP Time Calculation (min) 45 min    Activity Tolerance Patient tolerated treatment well             Past Medical History:  Diagnosis Date   Aortic atherosclerosis (HCC) 09/11/2016   noted on CXR   Arthritis    Baker's cyst, left 2014   Small, Left   Bilateral leg numbness    Cataract    Bilateral   Diabetes mellitus without complication (HCC)    Diverticulosis 10/09/2016   Noted on CT abd/pelvis   Gout    Grade I diastolic dysfunction 09/12/2016   Noted on ECHO   Hepatic cyst 10/09/2016   Noted on CT abd/pelvis   History of iron deficiency anemia    History of migraine    Hypercholesteremia    Hypertension    Internal hemorrhoids    Lateral meniscal tear 2014   Left   LVH (left ventricular hypertrophy) 03/03/2018   noted on EKG   Obesity    PMB (postmenopausal bleeding)    Pneumonia 2018   Right upper lower   PONV (postoperative nausea and vomiting)    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   Right 1.3 cm, Noted on CT Chest, pt unaware   Renal cyst 10/09/2016   Noted on CT abd/pelvis   Thoracic aortic aneurysm 12/09/2016   ascending 4.0 cm, noted on CT chest, pt unaware    Past Surgical History:  Procedure Laterality Date   ABDOMINAL HYSTERECTOMY     CATARACT EXTRACTION Right 09/2010   CATARACT EXTRACTION Left 10/2010   COLONOSCOPY     DILATION AND CURETTAGE OF UTERUS     TOTAL KNEE ARTHROPLASTY Right  04/21/2018   Procedure: TOTAL KNEE ARTHROPLASTY;  Surgeon: Frederico Hamman, MD;  Location: [facility];  Service: Orthopedics;  Laterality: Right;  Adductor Block    There were no vitals filed for this visit.   Subjective Assessment - 04/27/21 1136     Subjective "He said I was talking louder."    Patient is accompained by: Family member    Currently in Pain? No/denies               ADULT SLP TREATMENT - 04/27/21 1137       General Information   Behavior/Cognition Alert;Cooperative;Pleasant mood    Patient Positioning Upright in chair    Oral care provided N/A    HPI [name] is an 80 yo female who was referred by Shawnie Dapper, NP (neurology) for SLP evaluation and treatment for dysarthria and dysphagia in setting of Parkinson's disease (diagnosed in 2021). She takes generic Sinemet 1 tablet QID. She uses a Museum/gallery exhibitions officer for ambulation. She reported changes to her speech and swallowing during her last visit with neurology (coughing with liquids).      Treatment Provided   Treatment provided Cognitive-Linquistic      Pain Assessment   Pain  Assessment No/denies pain      Cognitive-Linquistic Treatment   Treatment focused on Dysarthria;Patient/family/caregiver education    Skilled Treatment Speakout! treatment protocol, cycles of SLP guided warm up, sustained phonation, glides, counting, reading, and cognitive speech tasks. SLP provided feedback and cues to maximize accuracy/effort.      Assessment / Recommendations / Plan   Plan Continue with current plan of care      Progression Toward Goals   Progression toward goals Progressing toward goals                SLP Short Term Goals - 04/27/21 1151       SLP SHORT TERM GOAL #1   Title Pt will coordinate vocal and articulatory subsystems in hierarchical speech tasks by producing sounds with intention with min assistance.    Baseline mod assist    Time 6    Period Weeks    Status On-going    Target Date 05/06/21      SLP  SHORT TERM GOAL #2   Title Read phrases, sentences, and paragraphs with intention, yielding improved vocal quality, loudness, articulatory precision, and endurance while maintaining a minimum of 85 dB with min assistance.    Baseline mod assist    Time 6    Period Weeks    Status On-going    Target Date 05/06/21      SLP SHORT TERM GOAL #3   Title Generalize intentional speech to cognitive-linguistic exercises and conversational speech with improved vocal quality, loudness, articulatory precision, and endurance while maintaining a minimum of 85 dB with min    Baseline mod assist    Time 6    Period Weeks    Status On-going    Target Date 05/06/21              SLP Long Term Goals - 04/27/21 1151       SLP LONG TERM GOAL #1   Title Same as short              Plan - 04/27/21 1151     Clinical Impression Statement Pt was accompanied to therapy today by her husband. Pt completed lesson 15 in workbook with min cues from SLP. Pt produced m-cycle with ~89 dB, /a/ with ~90 dB, glides with ~88 dB, counting with ~88 dB, oral reading with 86 dB and cognitive tasks with ~69-76 dB. In spontaneous conversation, Pt's loudness fluctuated between 69-76 dB. She required increased cueing for intent in cognitive tasks and conversation. Pt continues to be motivated, make progress, and have excellent family support. Plan for one more session and then completed refresher course in ~3 months.Continue plan of care.    Speech Therapy Frequency 1x /week    Duration 2 weeks    Treatment/Interventions Compensatory strategies;Patient/family education;Cueing hierarchy;SLP instruction and feedback;Pharyngeal strengthening exercises    Potential to Achieve Goals Good    SLP Home Exercise Plan Pt will completed HEP as assigned to facilitate carryover of treatment strategies and techniques in home environment with use of written cues as needed.    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  Acute encephalopathy 04/22/2018   Bradypnea    Primary localized osteoarthritis of right knee 02/10/2018   Acute viral syndrome 10/06/2016   Weakness 10/06/2016   Hypercholesteremia 10/06/2016   Gout 10/06/2016   Diabetes mellitus without complication ([place]) [id number]   Viral syndrome 10/06/2016   Precordial chest pain 09/11/2016   Diabetes ([place]) 05/03/2016   HYPOKALEMIA 11/01/2008   HYPERGLYCEMIA, FASTING 11/01/2008   Type 2 diabetes mellitus with hyperlipidemia ([place]) 10/17/2008   GOUT, UNSPECIFIED 10/17/2008   OBESITY 10/17/2008   MIGRAINE HEADACHE 10/17/2008   Essential hypertension 10/17/2008   INTERNAL HEMORRHOIDS 10/17/2008   DIVERTICULOSIS, COLON 10/17/2008   ARTHRITIS 10/17/2008   Thank you,  Genene Churn, [place]  [place], [place] 04/27/2021, 11:57 AM  [place], [place], [address] Phone: [phone]   Fax:  [phone]   Name: [name] MRN: [id number] Date of Birth: 11/05/1940

## 2021-05-04 ENCOUNTER — Other Ambulatory Visit: Payer: Self-pay

## 2021-05-04 ENCOUNTER — Ambulatory Visit (HOSPITAL_COMMUNITY): Payer: Medicare HMO | Admitting: Speech Pathology

## 2021-05-04 ENCOUNTER — Encounter (HOSPITAL_COMMUNITY): Payer: Self-pay | Admitting: Speech Pathology

## 2021-05-04 DIAGNOSIS — R471 Dysarthria and anarthria: Secondary | ICD-10-CM

## 2021-05-04 NOTE — Therapy (Signed)
[facility] [address] [place], [place], [address] Phone: [phone]   Fax:  [phone]  Speech Language Pathology Treatment  Patient Details  Name: [name] MRN: [id number] Date of Birth: 1940/07/19 Referring Provider (SLP): Debbora Presto, NP, neurology   Encounter Date: 05/04/2021   End of Session - 05/04/21 1229     Visit Number 11    Number of Visits 13    Date for SLP Re-Evaluation 05/07/21   refresher treatment session in 3 months   Authorization Type [employer] HMO    SLP Start Time 3    SLP Stop Time  3382    SLP Time Calculation (min) 45 min    Activity Tolerance Patient tolerated treatment well             Past Medical History:  Diagnosis Date   Aortic atherosclerosis ([place]) 09/11/2016   noted on CXR   Arthritis    Baker's cyst, left 2014   Small, Left   Bilateral leg numbness    Cataract    Bilateral   Diabetes mellitus without complication ([place])    Diverticulosis 10/09/2016   Noted on CT abd/pelvis   Gout    Grade I diastolic dysfunction 50/53/9767   Noted on ECHO   Hepatic cyst 10/09/2016   Noted on CT abd/pelvis   History of iron deficiency anemia    History of migraine    Hypercholesteremia    Hypertension    Internal hemorrhoids    Lateral meniscal tear 2014   Left   LVH (left ventricular hypertrophy) 03/03/2018   noted on EKG   Obesity    PMB (postmenopausal bleeding)    Pneumonia 2018   Right upper lower   PONV (postoperative nausea and vomiting)    Renal arterial aneurysm ([place]) 12/09/2016   Right 1.3 cm, Noted on CT Chest, pt unaware   Renal cyst 10/09/2016   Noted on CT abd/pelvis   Thoracic aortic aneurysm 12/09/2016   ascending 4.0 cm, noted on CT chest, pt unaware    Past Surgical History:  Procedure Laterality Date   ABDOMINAL HYSTERECTOMY     CATARACT EXTRACTION Right 09/2010   CATARACT EXTRACTION Left 10/2010   COLONOSCOPY     DILATION AND CURETTAGE OF  UTERUS     TOTAL KNEE ARTHROPLASTY Right 04/21/2018   Procedure: TOTAL KNEE ARTHROPLASTY;  Surgeon: Earlie Server, MD;  Location: [facility];  Service: Orthopedics;  Laterality: Right;  Adductor Block    There were no vitals filed for this visit.   Subjective Assessment - 05/04/21 1226     Subjective "I feel a little weak today."    Patient is accompained by: Family member    Currently in Pain? No/denies                   ADULT SLP TREATMENT - 05/04/21 1227       General Information   Behavior/Cognition Alert;Cooperative;Pleasant mood    Patient Positioning Upright in chair    Oral care provided N/A    HPI [name] is an 80 yo female who was referred by Debbora Presto, NP (neurology) for SLP evaluation and treatment for dysarthria and dysphagia in setting of Parkinson's disease (diagnosed in 2021). She takes generic Sinemet 1 tablet QID. She uses a Corporate investment banker for ambulation. She reported changes to her speech and swallowing during her last visit with neurology (coughing with liquids).      Treatment Provided   Treatment provided  Cognitive-Linquistic      Pain Assessment   Pain Assessment No/denies pain      Cognitive-Linquistic Treatment   Treatment focused on Dysarthria;Patient/family/caregiver education    Skilled Treatment Speakout! treatment protocol, cycles of SLP guided warm up, sustained phonation, glides, counting, reading, and cognitive speech tasks. SLP provided feedback and cues to maximize accuracy/effort.      Assessment / Recommendations / Plan   Plan Other (Comment)   Pt will come for a refresher session in ~3 months     Progression Toward Goals   Progression toward goals Progressing toward goals              SLP Education - 05/04/21 1228     Education Details SPEAKOUT! Lesson 15+    Person(s) Educated Patient;Spouse    Methods Explanation;Handout    Comprehension Verbalized understanding              SLP Short Term Goals - 05/04/21 1250        SLP SHORT TERM GOAL #1   Title Pt will coordinate vocal and articulatory subsystems in hierarchical speech tasks by producing sounds with intention with min assistance.    Baseline mod assist    Time 6    Period Weeks    Status Achieved    Target Date 05/06/21      SLP SHORT TERM GOAL #2   Title Read phrases, sentences, and paragraphs with intention, yielding improved vocal quality, loudness, articulatory precision, and endurance while maintaining a minimum of 85 dB with min assistance.    Baseline mod assist    Time 6    Period Weeks    Status Achieved    Target Date 05/06/21      SLP SHORT TERM GOAL #3   Title Generalize intentional speech to cognitive-linguistic exercises and conversational speech with improved vocal quality, loudness, articulatory precision, and endurance while maintaining a minimum of 85 dB with min    Baseline mod assist    Time 6    Period Weeks    Status Achieved    Target Date 05/06/21      SLP SHORT TERM GOAL #4   Title Pt to attend 1 refresher [place]! treatment session with SLP in March 2023 to ensure carryover and continued success with implementation of voice enhancing techniques.    Baseline met above goals, f/u to see if able to maintain    Time 3    Period Months    Status New    Target Date 08/18/21              SLP Long Term Goals - 04/27/21 1151       SLP LONG TERM GOAL #1   Title Same as short              Plan - 05/04/21 1240     Clinical Impression Statement Pt was accompanied to therapy today by her husband. She reported feeling a "little weak" today. Pt d/c interview completed and Pt demonstrated the following: Initial (03/11/21) duration of /a/ was 9 seconds and current is 10 seconds, initial loudness for /a/ was 74 dB and current is 93 dB, initial oral reading was 69 dB and current is 85 dB, and initial conversation was 67 dB and current is 74 dB. Both Pt and her family report improvements in her speech/voice and  are pleased with her progress. She completes her HEP independently and enjoys following along with the videos on line. She occasionally reports some  coughing with thin liquids, but reports coughing is rare when she takes small sips and holds the bolus briefly before swallow. SLP encouraged Pt and family to monitor this in case this increases in frequency and/or shows signs of PNA. Pt should continue with her SPEAKOUT! notebook 2x per day (or continue with online group) going forward. Plan to have Pt return in ~3 months for a refresher. Pt and spouse are in agreement with plan of care. Pt is scheduled for 08/10/20.    Speech Therapy Frequency Other (comment)    Duration One additional visit   in ~3 months for refresher   Treatment/Interventions Compensatory strategies;Patient/family education;Cueing hierarchy;SLP instruction and feedback;Pharyngeal strengthening exercises    Potential to Achieve Goals Good    SLP Home Exercise Plan Pt will completed HEP as assigned to facilitate carryover of treatment strategies and techniques in home environment with use of written cues as needed.    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   Acute encephalopathy 04/22/2018   Bradypnea    Primary localized osteoarthritis of right knee 02/10/2018   Acute viral syndrome 10/06/2016   Weakness 10/06/2016   Hypercholesteremia 10/06/2016   Gout 10/06/2016   Diabetes mellitus without complication ([place]) 23/36/1224   Viral syndrome 10/06/2016   Precordial chest pain 09/11/2016   Diabetes ([place]) 05/03/2016   HYPOKALEMIA 11/01/2008   HYPERGLYCEMIA, FASTING 11/01/2008   Type 2 diabetes  mellitus with hyperlipidemia ([place]) 10/17/2008   GOUT, UNSPECIFIED 10/17/2008   OBESITY 10/17/2008   MIGRAINE HEADACHE 10/17/2008   Essential hypertension 10/17/2008   INTERNAL HEMORRHOIDS 10/17/2008   DIVERTICULOSIS, COLON 10/17/2008   ARTHRITIS 10/17/2008   Thank you,  Genene Churn, [place]  Genene Churn, [place] 05/04/2021, 12:52 PM  [place], [place], [address] Phone: [phone]   Fax:  [phone]   Name: [name] MRN: [id number] Date of Birth: Feb 11, 1941

## 2021-06-09 DIAGNOSIS — E782 Mixed hyperlipidemia: Secondary | ICD-10-CM | POA: Diagnosis not present

## 2021-06-09 DIAGNOSIS — E1122 Type 2 diabetes mellitus with diabetic chronic kidney disease: Secondary | ICD-10-CM | POA: Diagnosis not present

## 2021-06-10 DIAGNOSIS — N1831 Chronic kidney disease, stage 3a: Secondary | ICD-10-CM | POA: Diagnosis not present

## 2021-06-10 DIAGNOSIS — E782 Mixed hyperlipidemia: Secondary | ICD-10-CM | POA: Diagnosis not present

## 2021-06-10 DIAGNOSIS — I129 Hypertensive chronic kidney disease with stage 1 through stage 4 chronic kidney disease, or unspecified chronic kidney disease: Secondary | ICD-10-CM | POA: Diagnosis not present

## 2021-06-10 DIAGNOSIS — D509 Iron deficiency anemia, unspecified: Secondary | ICD-10-CM | POA: Diagnosis not present

## 2021-06-10 DIAGNOSIS — M109 Gout, unspecified: Secondary | ICD-10-CM | POA: Diagnosis not present

## 2021-06-10 DIAGNOSIS — G20 Parkinson's disease: Secondary | ICD-10-CM | POA: Diagnosis not present

## 2021-06-10 DIAGNOSIS — R946 Abnormal results of thyroid function studies: Secondary | ICD-10-CM | POA: Diagnosis not present

## 2021-06-10 DIAGNOSIS — E1122 Type 2 diabetes mellitus with diabetic chronic kidney disease: Secondary | ICD-10-CM | POA: Diagnosis not present

## 2021-06-10 DIAGNOSIS — E119 Type 2 diabetes mellitus without complications: Secondary | ICD-10-CM | POA: Diagnosis not present

## 2021-06-10 DIAGNOSIS — M1711 Unilateral primary osteoarthritis, right knee: Secondary | ICD-10-CM | POA: Diagnosis not present

## 2021-06-25 ENCOUNTER — Other Ambulatory Visit: Payer: Self-pay

## 2021-06-25 ENCOUNTER — Ambulatory Visit: Payer: Medicare HMO | Admitting: Neurology

## 2021-06-25 ENCOUNTER — Encounter: Payer: Self-pay | Admitting: Neurology

## 2021-06-25 VITALS — BP 135/84 | HR 80 | Ht 62.0 in | Wt 167.4 lb

## 2021-06-25 DIAGNOSIS — R6889 Other general symptoms and signs: Secondary | ICD-10-CM | POA: Diagnosis not present

## 2021-06-25 DIAGNOSIS — G20 Parkinson's disease: Secondary | ICD-10-CM | POA: Diagnosis not present

## 2021-06-25 MED ORDER — CARBIDOPA-LEVODOPA 25-100 MG PO TABS: ORAL_TABLET | ORAL | 2 refills | Status: DC

## 2021-06-25 NOTE — Progress Notes (Signed)
Subjective:    Patient ID: [name] is a 81 y.o. female.  HPI    Interim history:   [name] is an 81 year old right-handed woman with an underlying complex medical history of hypertension, hyperlipidemia, diastolic dysfunction, chronic kidney disease, diverticulosis, diabetes, leg numbness, renal artery aneurysm, obesity, left ventricular hypertrophy, gout, arthritis, history of pneumonia, and history of migraines, who presents for FU consultation of her parkinsonism. The patient is accompanied by her husband today. I last saw her on 05/15/2020, at which time she reported feeling stable. She had not fallen.   She saw Debbora Presto, NP on 02/26/21, at which time she reported difficulty swallowing.  A swallow study was ordered.  She had a MBSS on 03/17/2021 with benign findings.  Today, 06/25/2021: she reports feeling about the same.  She completed speech therapy in [place].  She has a reevaluation pending for March 2023.  She has not fallen.  She takes Sinemet 4 times a day starting at 6 AM.  She stopped taking her gabapentin as she did not benefit from it.  She was using it for her knee pain.  She continues to have left knee pain.  She uses naproxen as needed.  No significant constipation is reported, memory and mood are stable, some mild forgetfulness per husband.  The patient's allergies, current medications, family history, past medical history, past social history, past surgical history and problem list were reviewed and updated as appropriate.    Previously:     I saw her on 10/10/2019, at which time she reported that the Sinemet was helpful for her tremor.  She had noticed no bladder dyscontrol and urinary incontinence.  She had not seen urology and was encouraged to talk to her primary care about a referral to see urology.  She had residual right knee pain and also reported left knee pain.  I suggested she increase her Sinemet to 1 pill 4 times daily.    She called in the interim  reporting in June that she felt her swelling became worse after she increase the Sinemet but she was encouraged to follow-up with her primary care regarding the lower extremity swelling as Sinemet is not typically the cause for lower extremity swelling.   She saw Debbora Presto, nurse practitioner in the interim on 01/16/2020, at which time she was stable on Sinemet 1 pill 4 times a day.  She was reporting numbness in both feet.  She was taking gabapentin 300 mg strength as needed for her knee pain primarily.        I first met her on 07/11/2019 at the request of her primary care provider, at which time she reported a several month history of hand tremors, right side more than left and also additional motor symptoms including slowness.  She had on examination findings in keeping with parkinsonism with right-sided lateralization noted.  Her history and exam were in keeping with right-sided Parkinson's disease and she was advised to start Sinemet at low-dose with gradual titration.     07/11/19: (She) reports a hand tremor for the past several months, less than a year.  Started in her right hand.  She has noticed slowness and difficulty with fine motor skills.  She has had balance problems.  She had a right total knee replacement in October 2020 and fell in December 2020.  Thankfully, she did not injure her right knee but she had some pain.  She was advised to start gabapentin.  In the past, she had  received injection into her lower back for lower back pain but has not had any recent pain in the lower back.  She also reports that she may need a replacement of her left knee but currently does not have any significant left knee pain.  She had been using a 2 wheeled walker prior to her fall and actually had graduated to a cane after finishing therapy.  After the fall she started using her rolling walker.  She denies any significant constipation, she is not aware of any family history of Parkinson's disease.  She denies  any significant memory issues.  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  She is a non-smoker and does not drink any alcohol.  She drinks caffeine and very little amounts.  She reports some difficulty sleeping, she has nocturia and also reports incontinence at night and also during the day, wears depends.  I reviewed your office note from 07/04/2019.  She was noted to have bilateral hand tremors.  She was advised to continue with the propranolol.  She has been on it for a few months as I understand.  She does not notice much in the way of benefit from it.  Her Past Medical History Is Significant For: Past Medical History:  Diagnosis Date   Aortic atherosclerosis ([facility]) 09/11/2016   noted on CXR   Arthritis    Baker's cyst, left 2014   Small, Left   Bilateral leg numbness    Cataract    Bilateral   Diabetes mellitus without complication ([facility])    Diverticulosis 10/09/2016   Noted on CT abd/pelvis   Gout    Grade I diastolic dysfunction 84/16/6063   Noted on ECHO   Hepatic cyst 10/09/2016   Noted on CT abd/pelvis   History of iron deficiency anemia    History of migraine    Hypercholesteremia    Hypertension    Internal hemorrhoids    Lateral meniscal tear 2014   Left   LVH (left ventricular hypertrophy) 03/03/2018   noted on EKG   Obesity    PMB (postmenopausal bleeding)    Pneumonia 2018   Right upper lower   PONV (postoperative nausea and vomiting)    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   Right 1.3 cm, Noted on CT Chest, pt unaware   Renal cyst 10/09/2016   Noted on CT abd/pelvis   Thoracic aortic aneurysm 12/09/2016   ascending 4.0 cm, noted on CT chest, pt unaware    Her Past Surgical History Is Significant For: Past Surgical History:  Procedure Laterality Date   ABDOMINAL HYSTERECTOMY     CATARACT EXTRACTION Right 09/2010   CATARACT EXTRACTION Left 10/2010   COLONOSCOPY     DILATION AND CURETTAGE OF UTERUS      TOTAL KNEE ARTHROPLASTY Right 04/21/2018   Procedure: TOTAL KNEE ARTHROPLASTY;  Surgeon: Earlie Server, MD;  Location: [facility];  Service: Orthopedics;  Laterality: Right;  Adductor Block    Her Family History Is Significant For: Family History  Problem Relation Age of Onset   Kidney disease Mother    Diabetes Mother    Congestive Heart Failure Mother    Cancer Father        unsure of type   Multiple sclerosis Neg Hx     Her Social History Is Significant For: Social History   Socioeconomic History   Marital status: Married    Spouse name: Not on file  Number of children: 2   Years of education: 12   Highest education level: High school graduate  Occupational History   Occupation: Retired  Tobacco Use   Smoking status: Never   Smokeless tobacco: Never  Vaping Use   Vaping Use: Never used  Substance and Sexual Activity   Alcohol use: No   Drug use: No   Sexual activity: Not on file  Other Topics Concern   Not on file  Social History Narrative   Lives at home with her husband.   Right-handed.   1/4 cup coffee per day.   Social Determinants of Health   Financial Resource Strain: Not on file  Food Insecurity: Not on file  Transportation Needs: Not on file  Physical Activity: Not on file  Stress: Not on file  Social Connections: Not on file    Her Allergies Are:  No Known Allergies:   Her Current Medications Are:  Outpatient Encounter Medications as of 06/25/2021  Medication Sig   acetaminophen (TYLENOL) 500 MG tablet Take 500 mg by mouth every 6 (six) hours as needed for moderate pain.    amLODipine (NORVASC) 5 MG tablet Take 5 mg by mouth daily.    carbidopa-levodopa (SINEMET IR) 25-100 MG tablet Take 1 tablet by mouth 4 (four) times daily. At 6 AM, 10 AM, 2 PM and 6 PM   chlorthalidone (HYGROTON) 25 MG tablet Take 12.5 mg by mouth daily.   gabapentin (NEURONTIN) 300 MG capsule Take 1 capsule by mouth at bedtime.   metFORMIN (GLUCOPHAGE) 500 MG tablet  Take 500 mg by mouth daily with breakfast.   naproxen (NAPROSYN) 500 MG tablet Take 500 mg by mouth as needed.   olmesartan (BENICAR) 20 MG tablet Take 20 mg by mouth daily.   simvastatin (ZOCOR) 40 MG tablet Take 40 mg by mouth every evening.   traMADol (ULTRAM) 50 MG tablet Take 1-2 tabs po q6hrs prn pain, start with lowest effective dose.   No facility-administered encounter medications on file as of 06/25/2021.  :  Review of Systems:  Out of a complete 14 point review of systems, all are reviewed and negative with the exception of these symptoms as listed below:   Review of Systems  Neurological:        Pt is here for follow up with Parkinson's Disease . Pt states that her toes in both feet are numb. Pt states there has no been much change since her last appointment. Pt has questions about the Gabapentin    Objective:  Neurological Exam  Physical Exam Physical Examination:   Vitals:   06/25/21 1036  BP: 135/84  Pulse: 80    General Examination: The patient is a very pleasant 81 y.o. female in no acute distress. She appears well-developed and well-nourished and well groomed.   HEENT: Normocephalic, atraumatic, pupils are equal, round and reactive to light. Extraocular tracking is mildly impaired, she has mild to moderate facial masking, she has an intermittent jaw tremor.  Corrective eyeglasses in place.  She has moderate nuchal rigidity.  She has intact hearing.  Airway examination reveals moderate mouth dryness, otherwise nonfocal findings, tongue protrudes centrally and palate elevates symmetrically.  Mild hypophonia noted, no voice tremor, no dysarthria.   Chest: Clear to auscultation without wheezing, rhonchi or crackles noted.   Wound check heart: S1+S2+0, regular and normal without murmurs, rubs or gallops noted.    Abdomen: Soft, non-tender and non-distended.   Extremities: There is b/l 1+ pitting edema in the ankles, L  more than R.    Skin: Warm and dry without  trophic changes noted.   Musculoskeletal: exam reveals left knee pain and left knee swelling, mild valgus deformity.     Neurologically:  Mental status: The patient is awake, alert and oriented in all 4 spheres. Her immediate and remote memory, attention, language skills and fund of knowledge are fairly appropriate, she is somewhat slow in her thinking and in her responses.  She does have hypophonia, no obvious dysarthria.  Mood and affect are normal. Cranial nerves II - XII are as described above under HEENT exam.  Motor exam: Normal bulk, global strength of 4+ out of 5, she has a mild to moderate resting tremor in the right upper extremity, mild in the left upper extremity, no lower extremity resting tremor.  She has a mild postural tremor, minimal action tremor.  Romberg is not tested due to safety concerns.  She has moderate difficulty with fine motor skills on the right upper and lower extremities, slightly better on the left.  She stands up with difficulty, takes 4 attempts but requires no assistance.  She pushes her self up, she uses a 2 wheeled walker.  Posture is moderately stooped, worse. Balance is mildly impaired.   Assessment and Plan:    In summary, [name] is a very pleasant 81 year old female with an underlying complex medical history of hypertension, hyperlipidemia, diastolic dysfunction, chronic kidney disease, diverticulosis, diabetes, leg numbness, renal artery aneurysm, obesity, left ventricular hypertrophy, gout, arthritis, status post right knee surgery, history of pneumonia, and history of migraines, who presents for follow-up consultation of her parkinsonism with history and exam in keeping with right-sided predominant Parkinson's disease. She has had response to levodopa therapy, has been able to tolerate Sinemet, which we increased from 1 pill 3 times daily to 1 pill 4 times daily in May 2021.  She does not currently have any significant problems with constipation but  has had ongoing issues with bladder control. Thankfully, she has not fallen.  She recently stopped taking her gabapentin which was given to her for her knee pain.  She has had mild forgetfulness.  We will continue to monitor. She is advised to continue with Sinemet 1 pill 4 times daily.  She is advised to follow-up routinely to see Debbora Presto, nurse practitioner in 4-6 months, sooner if needed.  I answered all their questions today and the patient and her husband were in agreement. I spent 30 minutes in total face-to-face time and in reviewing records during pre-charting, more than 50% of which was spent in counseling and coordination of care, reviewing test results, reviewing medications and treatment regimen and/or in discussing or reviewing the diagnosis of PD, the prognosis and treatment options. Pertinent laboratory and imaging test results that were available during this visit with the patient were reviewed by me and considered in my medical decision making (see chart for details).

## 2021-06-25 NOTE — Patient Instructions (Addendum)
It was nice to see you both again today.  Your examination is fairly stable, I would recommend that we continue with Sinemet generic 1 pill 4 times daily.  Please follow-up to see Debbora Presto, NP in about 4 to 6 months, sooner if needed.

## 2021-08-04 DIAGNOSIS — E1165 Type 2 diabetes mellitus with hyperglycemia: Secondary | ICD-10-CM | POA: Diagnosis not present

## 2021-08-04 DIAGNOSIS — M1990 Unspecified osteoarthritis, unspecified site: Secondary | ICD-10-CM | POA: Diagnosis not present

## 2021-08-10 ENCOUNTER — Encounter (HOSPITAL_COMMUNITY): Payer: Self-pay

## 2021-08-10 ENCOUNTER — Ambulatory Visit (HOSPITAL_COMMUNITY): Payer: Medicare HMO | Admitting: Speech Pathology

## 2021-09-17 ENCOUNTER — Encounter: Payer: Self-pay | Admitting: Family Medicine

## 2021-09-17 ENCOUNTER — Ambulatory Visit: Payer: Medicare HMO | Admitting: Family Medicine

## 2021-09-17 VITALS — BP 150/98 | HR 62 | Wt 163.0 lb

## 2021-09-17 DIAGNOSIS — R29898 Other symptoms and signs involving the musculoskeletal system: Secondary | ICD-10-CM | POA: Diagnosis not present

## 2021-09-17 DIAGNOSIS — R6889 Other general symptoms and signs: Secondary | ICD-10-CM

## 2021-09-17 DIAGNOSIS — G20 Parkinson's disease: Secondary | ICD-10-CM

## 2021-09-17 NOTE — Patient Instructions (Signed)
Below is our plan: ? ?We will continue Sinemet 1 tablet 4 times daily. I will send you to PT/OT for evaluation and strengthening. Please monitor for any worsening symptoms, specifically if they are sudden. Please contact your PCP to discuss elevated BP. ? ?Please make sure you are staying well hydrated. I recommend 50-60 ounces daily. Well balanced diet and regular exercise encouraged. Consistent sleep schedule with 6-8 hours recommended.  ? ?Please continue follow up with care team as directed.  ? ?Follow up with Dr Frances Furbish  in 4-6 months  ? ?You may receive a survey regarding today's visit. I encourage you to leave honest feed back as I do use this information to improve patient care. Thank you for seeing me today!  ? ? ?

## 2021-09-17 NOTE — Progress Notes (Signed)
? ? ?PATIENT: [name] ?DOB: 13-Feb-1941 ? ?REASON FOR VISIT: follow up ?HISTORY FROM: patient ? ?Chief Complaint  ?Patient presents with  ? Follow-up  ?  Room 12, with daughter  ?c/o of weakness and difficulty walking, using walker at home, sx worsen last week   ? ?  ?HISTORY OF PRESENT ILLNESS: ? ?09/17/21 ALL ?[name] returns for follow up for PD. She continues generic Sinemet 1 tablet four times daily. She was last seen by Dr Rexene Alberts 06/2021 and reported symptoms were stable. Swallow eval was benign. She had completed ST. She presents with her daughter, today, who aids in history. She has had some progressive weakness over the past few months. She had a really bad day last week where she had a difficult time walking and controlling her walker. No concerns of stroke like symptoms. She feels that she is slowly improving. She does have generalized weakness of both lower extremities. No improvement after taking Sinemet. She denies falls. She uses Rolator at all times. Appetite is decent. She eats 2 full meals a day and snacks regularly. She is drinking Boost. She denies trouble swallowing. She has a peddler that she uses daily. She tries to walk around the home throughout the day. She is able to perform ADLs with minimal assistance.Can make simple meals. Memory is stable. She is able to dose her medications. She does not drive.  ? ?BP is elevated, today. She does not routinely check readings at home. She takes olmesartan and amlodipine daily. No chest pain or shob. She denies any concerns of illness. No viral or urinary symptoms.  ? ?Her daughter lives in [place]. She is requesting FMLA paperwork so that she may come to [place] to help care for her mother and father.  ? ?06/25/2021 SA: ?Today, 06/25/2021: she reports feeling about the same.  She completed speech therapy in [place].  She has a reevaluation pending for March 2023.  She has not fallen.  She takes Sinemet 4 times a day starting at 6 AM.  She stopped taking her  gabapentin as she did not benefit from it.  She was using it for her knee pain.  She continues to have left knee pain.  She uses naproxen as needed.  No significant constipation is reported, memory and mood are stable, some mild forgetfulness per husband. ? ?02/26/2021 ALL:  ?[name] returns for follow up for PD. She continues generic Sinemet 1 tablet QID. She feels that she is doing fairly well. Right arm tremor is improved. She notices the tremor from time to time but states it is much better and not bothersome. She denies changes in gait. No falls. She uses her Rolator at all times. She is sleeping well. Memory is good. She does have concerns of difficulty swallowing and feels that her speech has changed. She reports getting "choked up" nearly every day. Usually only when drinking water. She is able to cough a time or two and then feels fine. She feels that her speech is more slurred than it used to be. She does not have difficulty with conversation but feels that her speech sounds slurred. She does not notice any improvement or changes following Sinemet dosing. She continues follow up with PCP regularly. She reports neck pain is stable.  ? 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  She has not fallen, she uses a walker inside and outside.  She tries to exercise in the form of walking and  she also has a pedaling device.  She tries to hydrate well, she drinks plenty of water with her medications and also in between.  She is not currently suffer from any significant constipation.  Her blood Melanie control is good by her recollection and her A1c was less than seven as far she knows.  She has regular checkup with her primary care physician or nurse practitioner.  She takes her Sinemet four times a day on schedule, typically four hourly starting at 6 AM.  She had recent flare of the neck pain.  She was given a steroid course for 4 days through her primary care and  lidocaine patches and feels improved, she no longer uses the lidocaine patches. ? ?01/16/2020 ALL:  ?[name] is a 81 y.o. female here today for follow up for PD. She is taking Sinemet 1 tablet four times daily. She feels that she is doing better. She is tolerating Sinemet. She is followed closely by PCP. She ia diabetic and reports bilateral feet numbness. She is taking gabapentin 31m PRN for knee pain but PCP recently suggested that she take gabapentin every day. She has not increased dosing. She does not grogginess with taking gabapentin. She continues to have difficulty with urinary frequency and incontinence. She has not seen urology. She continues to use a rolling walker. She lives with her husband. She does not drive. She helps with finances. She doses her own medications. She is able to perform ADL's independently. She denies memory concerns.  ? ?HISTORY: (copied from Dr AGuadelupe Sabinnote on 10/10/2019) ? ?[name] a 81year old right-handed woman with an underlying complex medical history of hypertension, hyperlipidemia, diastolic dysfunction, chronic kidney disease, diverticulosis, diabetes, leg numbness, renal artery aneurysm, obesity, left ventricular hypertrophy, gout, arthritis, history of pneumonia, and history of migraines, who presents for FU consultation of her parkinsonism. The patient is unaccompanied today.  I first met her on 07/11/2019 at the request of her primary care provider, at which time she reported a several month history of hand tremors, right side more than left and also additional motor symptoms including slowness.  She had on examination findings in keeping with parkinsonism with right-sided lateralization noted.  Her history and exam were in keeping with right-sided Parkinson's disease and she was advised to start Sinemet at low-dose with gradual titration. ?  ?Today, 10/10/2019: She reports that the Sinemet has helped her tremor.  She tolerates it.  She has noticed more bladder  dyscontrol and urinary incontinence, some during the day and also at night.  She has never seen urology.  She denies any symptoms concerning for urinary tract infection.  She has an appointment for blood work and follow-up with her primary care physician later this month.  She has an appointment for her eye exam tomorrow.  She has taken the Covid vaccine, had some side effects after the second shot including feeling weaker and sore in the arm for about 24 hours but no lasting issues.  She denies constipation, sleeps well, mood is stable, no falls.  She uses a rolling walker.  She has residual right knee pain.  She had right knee replacement.  She also reports that she really needs a left knee replacement but is reluctant to pursue this.  She no longer takes propranolol. ?  ?The patient's allergies, current medications, family history, past medical history, past social history, past surgical history and problem list were reviewed and updated as appropriate.  ?  ?Previously:  ?  ?07/11/19: (  She) reports a hand tremor for the past several months, less than a year.  Started in her right hand.  She has noticed slowness and difficulty with fine motor skills.  She has had balance problems.  She had a right total knee replacement in October 2020 and fell in December 2020.  Thankfully, she did not injure her right knee but she had some pain.  She was advised to start gabapentin.  In the past, she had received injection into her lower back for lower back pain but  ?has not had any recent pain in the lower back.  She also reports that she may need a replacement of her left knee but currently does not have any significant left knee pain.  She had been using a 2 wheeled walker prior to her fall and actually had graduated to a cane after finishing therapy.  After the fall she started using her rolling walker.  She denies any significant constipation, she is not aware of any family history of Parkinson's disease.  She denies any  significant memory issues.  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  She is a non-smoker and does not drink any alcohol.  She drinks caffeine and very

## 2021-09-18 DIAGNOSIS — R350 Frequency of micturition: Secondary | ICD-10-CM | POA: Diagnosis not present

## 2021-09-18 DIAGNOSIS — M6281 Muscle weakness (generalized): Secondary | ICD-10-CM | POA: Diagnosis not present

## 2021-09-21 ENCOUNTER — Telehealth: Payer: Self-pay | Admitting: Family Medicine

## 2021-09-21 NOTE — Telephone Encounter (Signed)
Called the patient back. Advised that it appears the neuro rehab had forwarded that order to the [facility] location because of location. Advised that there isn't any difference in the type of therapy that they would do there vs [facility] and so their thought process is [facility] was closer for the pt  ?Advised that she can still have it completed through [facility] if she would like. She states it was only one therapist in the [place] location and they are booking 2+ weeks out. They asked how far the [place] location was booking and I informed that I did not know what their schedule was like. I advised of their phone number and informed them to call their location to discuss. Pt verbalized understanding. ? ?

## 2021-09-21 NOTE — Telephone Encounter (Signed)
Pt called and stated she was under the impression she would go going to [facility] to complete therapy.  ?Pt got a call and spoke with Thurston Hole Pen about the referral they received from [facility]. ?Pt would like a call back to discuss where she will be completing her therapy for Parkinson's.  ?

## 2021-09-29 ENCOUNTER — Telehealth: Payer: Self-pay | Admitting: *Deleted

## 2021-09-29 DIAGNOSIS — Z0289 Encounter for other administrative examinations: Secondary | ICD-10-CM

## 2021-09-29 NOTE — Telephone Encounter (Signed)
Gave completed/signed FMLA for pt daughter back to medical records to process for pt. ?

## 2021-09-30 NOTE — Telephone Encounter (Signed)
Pt [place] from faxed on 09/30/21 to [phone] ?

## 2021-10-07 DIAGNOSIS — R946 Abnormal results of thyroid function studies: Secondary | ICD-10-CM | POA: Diagnosis not present

## 2021-10-07 DIAGNOSIS — E1122 Type 2 diabetes mellitus with diabetic chronic kidney disease: Secondary | ICD-10-CM | POA: Diagnosis not present

## 2021-10-07 DIAGNOSIS — E782 Mixed hyperlipidemia: Secondary | ICD-10-CM | POA: Diagnosis not present

## 2021-10-13 DIAGNOSIS — R946 Abnormal results of thyroid function studies: Secondary | ICD-10-CM | POA: Diagnosis not present

## 2021-10-13 DIAGNOSIS — M109 Gout, unspecified: Secondary | ICD-10-CM | POA: Diagnosis not present

## 2021-10-13 DIAGNOSIS — I129 Hypertensive chronic kidney disease with stage 1 through stage 4 chronic kidney disease, or unspecified chronic kidney disease: Secondary | ICD-10-CM | POA: Diagnosis not present

## 2021-10-13 DIAGNOSIS — G20 Parkinson's disease: Secondary | ICD-10-CM | POA: Diagnosis not present

## 2021-10-13 DIAGNOSIS — N1831 Chronic kidney disease, stage 3a: Secondary | ICD-10-CM | POA: Diagnosis not present

## 2021-10-13 DIAGNOSIS — E1122 Type 2 diabetes mellitus with diabetic chronic kidney disease: Secondary | ICD-10-CM | POA: Diagnosis not present

## 2021-10-13 DIAGNOSIS — M1711 Unilateral primary osteoarthritis, right knee: Secondary | ICD-10-CM | POA: Diagnosis not present

## 2021-10-13 DIAGNOSIS — D509 Iron deficiency anemia, unspecified: Secondary | ICD-10-CM | POA: Diagnosis not present

## 2021-10-13 DIAGNOSIS — E782 Mixed hyperlipidemia: Secondary | ICD-10-CM | POA: Diagnosis not present

## 2021-10-15 ENCOUNTER — Ambulatory Visit (HOSPITAL_COMMUNITY): Payer: Medicare HMO | Attending: Family Medicine | Admitting: Physical Therapy

## 2021-10-15 DIAGNOSIS — R29898 Other symptoms and signs involving the musculoskeletal system: Secondary | ICD-10-CM | POA: Diagnosis not present

## 2021-10-15 DIAGNOSIS — R262 Difficulty in walking, not elsewhere classified: Secondary | ICD-10-CM | POA: Diagnosis not present

## 2021-10-15 DIAGNOSIS — N1831 Chronic kidney disease, stage 3a: Secondary | ICD-10-CM | POA: Diagnosis not present

## 2021-10-15 DIAGNOSIS — G20 Parkinson's disease: Secondary | ICD-10-CM | POA: Insufficient documentation

## 2021-10-15 DIAGNOSIS — M6281 Muscle weakness (generalized): Secondary | ICD-10-CM | POA: Diagnosis not present

## 2021-10-15 NOTE — Therapy (Signed)
?OUTPATIENT PHYSICAL THERAPY NEURO EVALUATION ? ? ?Patient Name: [name] ?MRN: [id number] ?DOB:March 22, 1941, 81 y.o., female ?Today's Date: 10/15/2021 ? ?PCP: Allyn Kenner ?REFERRING PROVIDER: Amy Lomax ? ? PT End of Session - 10/15/21 1133   ? ? Visit Number 1   ? Number of Visits 12   ? Date for PT Re-Evaluation 11/26/21   ? Authorization Type [facility] authorization placed   ? Progress Note Due on Visit 10   ? PT Start Time 1130   ? PT Stop Time [id number]   ? PT Time Calculation (min) 45 min   ? ?  ?  ? ?  ? ? ?Past Medical History:  ?Diagnosis Date  ? Aortic atherosclerosis ([place]) 09/11/2016  ? noted on CXR  ? Arthritis   ? Baker's cyst, left 2014  ? Small, Left  ? Bilateral leg numbness   ? Cataract   ? Bilateral  ? Diabetes mellitus without complication ([place])   ? Diverticulosis 10/09/2016  ? Noted on CT abd/pelvis  ? Gout   ? Grade I diastolic dysfunction 0000000  ? Noted on ECHO  ? Hepatic cyst 10/09/2016  ? Noted on CT abd/pelvis  ? History of iron deficiency anemia   ? History of migraine   ? Hypercholesteremia   ? Hypertension   ? Internal hemorrhoids   ? Lateral meniscal tear 2014  ? Left  ? LVH (left ventricular hypertrophy) 03/03/2018  ? noted on EKG  ? Obesity   ? PMB (postmenopausal bleeding)   ? Pneumonia 2018  ? Right upper lower  ? PONV (postoperative nausea and vomiting)   ? 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 Right 1.3 cm, Noted on CT Chest, pt unaware  ? Renal cyst 10/09/2016  ? Noted on CT abd/pelvis  ? Thoracic aortic aneurysm ([place]) 12/09/2016  ? ascending 4.0 cm, noted on CT chest, pt unaware  ? ?Past Surgical History:  ?Procedure Laterality Date  ? ABDOMINAL HYSTERECTOMY    ? CATARACT EXTRACTION Right 09/2010  ? CATARACT EXTRACTION Left 10/2010  ? COLONOSCOPY    ? DILATION AND CURETTAGE OF UTERUS    ? TOTAL KNEE ARTHROPLASTY Right 04/21/2018  ? Procedure: TOTAL KNEE ARTHROPLASTY;  Surgeon: Earlie Server, MD;  Location: [facility];  Service: Orthopedics;  Laterality: Right;  Adductor  Block  ? ?Patient Active Problem List  ? Diagnosis Date Noted  ? 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 Acute encephalopathy 04/22/2018  ? Bradypnea   ? Primary localized osteoarthritis of right knee 02/10/2018  ? Acute viral syndrome 10/06/2016  ? Weakness 10/06/2016  ? Hypercholesteremia 10/06/2016  ? Gout 10/06/2016  ? Diabetes mellitus without complication ([place]) [id number]  ? Viral syndrome 10/06/2016  ? Precordial chest pain 09/11/2016  ? Diabetes ([place]) 05/03/2016  ? HYPOKALEMIA 11/01/2008  ? HYPERGLYCEMIA, FASTING 11/01/2008  ? Type 2 diabetes mellitus with hyperlipidemia ([place]) 10/17/2008  ? GOUT, UNSPECIFIED 10/17/2008  ? OBESITY 10/17/2008  ? MIGRAINE HEADACHE 10/17/2008  ? Essential hypertension 10/17/2008  ? INTERNAL HEMORRHOIDS 10/17/2008  ? DIVERTICULOSIS, COLON 10/17/2008  ? ARTHRITIS 10/17/2008  ? ? ?ONSET DATE:  PT states she has noted she has been getting weaker for a long time.  She went to her MD because she was having difficulty walking.  REFERRING DIAG: Parkinsons ? ?THERAPY DIAG:  ?Muscle weakness (generalized) ? ?Difficulty in walking, not elsewhere classified ? ?SUBJECTIVE:  ?                                                                                                                                                                                           ? ?  SUBJECTIVE STATEMENT:She is currently walking with a four wheeled walker. Pt states that her legs feel so weak.   She states that she here lately she has frozen and has not been able to walk.    ? ?Pt accompanied by: husband  ? ?PERTINENT HISTORY: Rt knee surgery, Parkinsons  ? ?PAIN:  ?Are you having pain? No ? ?PRECAUTIONS: Fall ? ?WEIGHT BEARING RESTRICTIONS No ? ?FALLS: Has patient fallen in last 6 months? No ? ?LIVING ENVIRONMENT: ?Lives with: lives with their family ?Lives in: House/apartment ?Stairs: Yes: External: 2 steps; can reach both ?Has following equipment at home: Gilford Rile - 4 wheeled and shower chair ? ?PLOF: Independent with basic  ADLs ? ?PATIENT GOALS To be stronger and be able to walk better.  ? ?OBJECTIVE:  ? ? ?COGNITION: ?Overall cognitive status: Within functional limits for tasks assessed ?  ?SENSATION: ?Not tested ? ? ? ?MMT:   ? ?MMT Right ?10/15/2021 Left ?10/15/2021  ?Hip flexion 3+/5  3+/5  ?Hip extension 2-/5 2/5 ?  ?Hip abduction 2/5  2+/5  ?Hip adduction    ?Hip internal rotation    ?Hip external rotation    ?Knee flexion 3/5 3/5  ?Knee extension 4/5 4/5  ?Ankle dorsiflexion 4+/5 4/5  ?Ankle plantarflexion    ?Ankle inversion    ?Ankle eversion    ?(Blank rows = not tested) ? ?Observation:  Pt is extremely fearful of falling when walking.  ? ?GAIT: ?Gait pattern: decreased step length- Right, decreased step length- Left, decreased hip/knee flexion- Right, decreased hip/knee flexion- Left, decreased ankle dorsiflexion- Right, and decreased ankle dorsiflexion- Left ?Distance walked: 154 ?Assistive device utilized: Environmental consultant - 4 wheeled ?Level of assistance: Modified independence ? ? ?FUNCTIONAL TESTs:  ?30 seconds chair stand test unable to stand without UE assist.   With UE 3 in 30 seconds.  ?2 minute walk test: with 4 wheeled walker pt walks for 154 ft.  ?Single leg test :  unable  ? ? ? ?TODAY'S TREATMENT:  ?Sit to stand x 10 ?Supine: Bridge x 10  ?             Marching x 10  ? ? ?PATIENT EDUCATION: ?Education details: HEP ?Person educated: Patient and Spouse ?Education method: Explanation, Demonstration, Verbal cues, and Handouts ?Education comprehension: verbalized understanding and returned demonstration ? ? ?HOME EXERCISE PROGRAM: ?Sit to stand x 10 ?Supine: Bridge x 10  ?             Marching x 10  ? ? ? ? ?GOALS: ?Goals reviewed with patient? No ? ?SHORT TERM GOALS: Target date: 11/05/2021    ? ?Pt to be I in a HEP to be able to come sit to stand without UE assist ?Baseline: ?Goal status: INITIAL ? ?2.  PT to be able to single leg stance B for 5 seconds to reduce risk of falling  ?Baseline:  ?Goal status: INITIAL ? ?3.  PT to  be I in bed mobility  ?Baseline:  ?Goal status: INITIAL ? ? ? ?LONG TERM GOALS: Target date: 11/26/2021 ?           1.    Pt to be I in advanced HEP to be able to walk 226 ft in a two minute time period for improve ambulation.     ?Baseline:  ?Goal status: INITIAL ? ?2.  Pt to be able to single leg stance for 10 seconds each to decrease risk of falling  ?Baseline:  ?Goal status: INITIAL ? ?3.  PT  LE strength to increase by one grade to allow pt to ascend and descend 3 steps with one hand hold  ?Baseline:  ?Goal status: INITIAL ? ?4.  PT to be able to complete 7 sit to stand in a 30 second period of time. ?Baseline:  ?Goal status: INITIAL ? ?ASSESSMENT: ? ?CLINICAL IMPRESSION: ?Patient is a [place]  y.o. 38 female who was seen today for physical therapy evaluation and treatment for weakness due to Parkinson disease. Evaluation demonstrates decreased strength, decreased balance, decreased bed mobility, and fear of falling.  Ms. Cisco will benefit from skilled PT to address these issues and improve her functional ability and safety.  ? ? ?OBJECTIVE IMPAIRMENTS Abnormal gait, decreased activity tolerance, decreased balance, decreased mobility, difficulty walking, and decreased strength.  ? ?ACTIVITY LIMITATIONS cleaning, community activity, and shopping.  ? ?PERSONAL FACTORS Age and 1-2 comorbidities: Parkinson disease   are also affecting patient's functional outcome.  ? ? ?REHAB POTENTIAL: Good ? ?CLINICAL DECISION MAKING: Stable/uncomplicated ? ?EVALUATION COMPLEXITY: Moderate ? ?PLAN: ?PT FREQUENCY: 2x/week ? ?PT DURATION: 6 weeks ? ?PLANNED INTERVENTIONS: Therapeutic exercises, Therapeutic activity, Neuromuscular re-education, Balance training, Gait training, and Patient/Family education ? ?PLAN FOR NEXT SESSION: PWR, bed mobility and strengthening exercises ? ?Rayetta Humphrey, PT CLT ?[phone]  ?10/15/2021, 11:35 AM ? ? ? ? ? ?  ?

## 2021-10-19 ENCOUNTER — Encounter (HOSPITAL_COMMUNITY): Payer: Self-pay | Admitting: Physical Therapy

## 2021-10-19 ENCOUNTER — Ambulatory Visit (HOSPITAL_COMMUNITY): Payer: Medicare HMO | Admitting: Physical Therapy

## 2021-10-19 DIAGNOSIS — M6281 Muscle weakness (generalized): Secondary | ICD-10-CM

## 2021-10-19 DIAGNOSIS — R262 Difficulty in walking, not elsewhere classified: Secondary | ICD-10-CM

## 2021-10-19 DIAGNOSIS — R29898 Other symptoms and signs involving the musculoskeletal system: Secondary | ICD-10-CM | POA: Diagnosis not present

## 2021-10-19 DIAGNOSIS — G20 Parkinson's disease: Secondary | ICD-10-CM | POA: Diagnosis not present

## 2021-10-19 NOTE — Therapy (Signed)
?OUTPATIENT PHYSICAL THERAPY TREATMENT NOTE ? ? ?Patient Name: [name] ?MRN: [id number] ?DOB:12/06/1940, 81 y.o., female ?Today's Date: 10/19/2021 ? ?PCP: Allyn Kenner MD ?REFERRING PROVIDER: Debbora Presto, NP  ? ?END OF SESSION:  ? PT End of Session - 10/19/21 1307   ? ? Visit Number 2   ? Number of Visits 12   ? Date for PT Re-Evaluation 11/26/21   ? Authorization Type [facility] authorization placed   ? Authorization Time Period 12 approved 5/11-6/22/23   ? Authorization - Visit Number 1   ? Authorization - Number of Visits 12   ? Progress Note Due on Visit 10   ? PT Start Time 1304   ? PT Stop Time 1342   ? PT Time Calculation (min) 38 min   ? Activity Tolerance Patient tolerated treatment well   ? Behavior During Therapy [facility] for tasks assessed/performed   ? ?  ?  ? ?  ? ? ?Past Medical History:  ?Diagnosis Date  ? Aortic atherosclerosis ([place]) 09/11/2016  ? noted on CXR  ? Arthritis   ? Baker's cyst, left 2014  ? Small, Left  ? Bilateral leg numbness   ? Cataract   ? Bilateral  ? Diabetes mellitus without complication ([place])   ? Diverticulosis 10/09/2016  ? Noted on CT abd/pelvis  ? Gout   ? Grade I diastolic dysfunction 0000000  ? Noted on ECHO  ? Hepatic cyst 10/09/2016  ? Noted on CT abd/pelvis  ? History of iron deficiency anemia   ? History of migraine   ? Hypercholesteremia   ? Hypertension   ? Internal hemorrhoids   ? Lateral meniscal tear 2014  ? Left  ? LVH (left ventricular hypertrophy) 03/03/2018  ? noted on EKG  ? Obesity   ? PMB (postmenopausal bleeding)   ? Pneumonia 2018  ? Right upper lower  ? PONV (postoperative nausea and vomiting)   ? 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 Right 1.3 cm, Noted on CT Chest, pt unaware  ? Renal cyst 10/09/2016  ? Noted on CT abd/pelvis  ? Thoracic aortic aneurysm ([place]) 12/09/2016  ? ascending 4.0 cm, noted on CT chest, pt unaware  ? ?Past Surgical History:  ?Procedure Laterality Date  ? ABDOMINAL HYSTERECTOMY    ? CATARACT EXTRACTION Right 09/2010  ?  CATARACT EXTRACTION Left 10/2010  ? COLONOSCOPY    ? DILATION AND CURETTAGE OF UTERUS    ? TOTAL KNEE ARTHROPLASTY Right 04/21/2018  ? Procedure: TOTAL KNEE ARTHROPLASTY;  Surgeon: Earlie Server, MD;  Location: [facility];  Service: Orthopedics;  Laterality: Right;  Adductor Block  ? ?Patient Active Problem List  ? Diagnosis Date Noted  ? 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 Acute encephalopathy 04/22/2018  ? Bradypnea   ? Primary localized osteoarthritis of right knee 02/10/2018  ? Acute viral syndrome 10/06/2016  ? Weakness 10/06/2016  ? Hypercholesteremia 10/06/2016  ? Gout 10/06/2016  ? Diabetes mellitus without complication ([place]) [id number]  ? Viral syndrome 10/06/2016  ? Precordial chest pain 09/11/2016  ? Diabetes ([place]) 05/03/2016  ? HYPOKALEMIA 11/01/2008  ? HYPERGLYCEMIA, FASTING 11/01/2008  ? Type 2 diabetes mellitus with hyperlipidemia ([place]) 10/17/2008  ? GOUT, UNSPECIFIED 10/17/2008  ? OBESITY 10/17/2008  ? MIGRAINE HEADACHE 10/17/2008  ? Essential hypertension 10/17/2008  ? INTERNAL HEMORRHOIDS 10/17/2008  ? DIVERTICULOSIS, COLON 10/17/2008  ? ARTHRITIS 10/17/2008  ? ? ?REFERRING DIAG: G20 (ICD-10-CM) - Parkinson's disease ([place]) R29.898 (ICD-10-CM) - Weakness of both lower extremities  ? ?THERAPY  DIAG:  ?Muscle weakness (generalized) ? ?Difficulty in walking, not elsewhere classified ? ?PERTINENT HISTORY:  Rt knee surgery, Parkinsons  ? ?PRECAUTIONS: Fall ? ?SUBJECTIVE: No new issues. Complaint with HEP. No pain.  ? ?PAIN:  ?Are you having pain? No ? ?  ?OBJECTIVE:  ?  ?  ?COGNITION: ?Overall cognitive status: Within functional limits for tasks assessed ?            ?SENSATION: ?Not tested ?  ?  ?  ?MMT:   ?  ?MMT Right ?10/15/2021 Left ?10/15/2021  ?Hip flexion 3+/5  3+/5  ?Hip extension 2-/5 2/5 ?   ?Hip abduction 2/5  2+/5  ?Hip adduction      ?Hip internal rotation      ?Hip external rotation      ?Knee flexion 3/5 3/5  ?Knee extension 4/5 4/5  ?Ankle dorsiflexion 4+/5 4/5  ?Ankle plantarflexion      ?Ankle  inversion      ?Ankle eversion      ?(Blank rows = not tested) ?  ?Observation:  Pt is extremely fearful of falling when walking.  ?  ?GAIT: ?Gait pattern: decreased step length- Right, decreased step length- Left, decreased hip/knee flexion- Right, decreased hip/knee flexion- Left, decreased ankle dorsiflexion- Right, and decreased ankle dorsiflexion- Left ?Distance walked: 154 ?Assistive device utilized: Environmental consultant - 4 wheeled ?Level of assistance: Modified independence ?  ?  ?FUNCTIONAL TESTs:  ?30 seconds chair stand test unable to stand without UE assist.   With UE 3 in 30 seconds.  ?2 minute walk test: with 4 wheeled walker pt walks for 154 ft.  ?Single leg test :  unable  ?   ?  ?  ?TODAY'S TREATMENT:  ?10/19/21 ?Bridge 10 x 5"  ?Supine march x10  ?Supine hip abduction/ adduction isometric 10 x 5" ?Max A with supine to sit transfer  ?Sit to stand x10 ? ? ?Eval: ?Sit to stand x 10 ?Supine: Bridge x 10  ?             Marching x 10  ?  ?  ?PATIENT EDUCATION: ?Education details: on therapy goals, HEP, exercise form and function  ?Person educated: Patient and Spouse ?Education method: Explanation, Demonstration, Verbal cues, and Handouts ?Education comprehension: verbalized understanding and returned demonstration ?  ?  ?HOME EXERCISE PROGRAM: ?Sit to stand x 10 ?Supine: Bridge x 10  ?             Marching x 10  ?  ?  ? 10/19/21 ?Supine hip abduction/ adduction isometrics  ?  ?GOALS: ?Goals reviewed with patient? No ?  ?SHORT TERM GOALS: Target date: 11/05/2021    ?  ?Pt to be I in a HEP to be able to come sit to stand without UE assist ?Baseline: ?Goal status: INITIAL ?  ?2.  PT to be able to single leg stance B for 5 seconds to reduce risk of falling  ?Baseline:  ?Goal status: INITIAL ?  ?3.  PT to be I in bed mobility  ?Baseline:  ?Goal status: INITIAL ?  ?  ?  ?LONG TERM GOALS: Target date: 11/26/2021 ?           1.    Pt to be I in advanced HEP to be able to walk 226 ft in a two minute time period for improve  ambulation.     ?Baseline:  ?Goal status: INITIAL ?  ?2.  Pt to be able to single leg stance for 10 seconds each  to decrease risk of falling  ?Baseline:  ?Goal status: INITIAL ?  ?3.  PT LE strength to increase by one grade to allow pt to ascend and descend 3 steps with one hand hold  ?Baseline:  ?Goal status: INITIAL ?  ?4.  PT to be able to complete 7 sit to stand in a 30 second period of time. ?Baseline:  ?Goal status: INITIAL ?  ?ASSESSMENT: ?  ?CLINICAL IMPRESSION: ?Reviewed therapy goals and HEP. Patient performs all activity in slow, but controlled manner. Progressed LE strengthening with supine hip adduction/. Abduction. Patient cued on proper form and educated on purpose. Continued core weakness and required Max A for supine to sit. Patient will continue to benefit from skilled therapy services to reduce remaining deficits and improve functional ability.  ? ?  ?OBJECTIVE IMPAIRMENTS Abnormal gait, decreased activity tolerance, decreased balance, decreased mobility, difficulty walking, and decreased strength.  ?  ?ACTIVITY LIMITATIONS cleaning, community activity, and shopping.  ?  ?PERSONAL FACTORS Age and 1-2 comorbidities: Parkinson disease   are also affecting patient's functional outcome.  ?  ?  ?REHAB POTENTIAL: Good ?  ?CLINICAL DECISION MAKING: Stable/uncomplicated ?  ?EVALUATION COMPLEXITY: Moderate ?  ?PLAN: ?PT FREQUENCY: 2x/week ?  ?PT DURATION: 6 weeks ?  ?PLANNED INTERVENTIONS: Therapeutic exercises, Therapeutic activity, Neuromuscular re-education, Balance training, Gait training, and Patient/Family education ?  ?PLAN FOR NEXT SESSION: PWR, bed mobility and strengthening exercises ? ? ?1:09 PM, 10/19/21 ?Josue Hector PT DPT  ?Physical Therapist with [facility]  ?[facility]  ?[phone] ? ?   ?

## 2021-10-22 ENCOUNTER — Encounter (HOSPITAL_COMMUNITY): Payer: Self-pay | Admitting: Physical Therapy

## 2021-10-22 ENCOUNTER — Ambulatory Visit (HOSPITAL_COMMUNITY): Payer: Medicare HMO | Admitting: Physical Therapy

## 2021-10-22 DIAGNOSIS — R262 Difficulty in walking, not elsewhere classified: Secondary | ICD-10-CM

## 2021-10-22 DIAGNOSIS — M6281 Muscle weakness (generalized): Secondary | ICD-10-CM | POA: Diagnosis not present

## 2021-10-22 DIAGNOSIS — G20 Parkinson's disease: Secondary | ICD-10-CM | POA: Diagnosis not present

## 2021-10-22 DIAGNOSIS — R29898 Other symptoms and signs involving the musculoskeletal system: Secondary | ICD-10-CM | POA: Diagnosis not present

## 2021-10-22 NOTE — Therapy (Signed)
OUTPATIENT PHYSICAL THERAPY TREATMENT NOTE   Patient Name: [name] MRN: [id number] DOB:March 30, 1941, 81 y.o., female Today's Date: 10/22/2021  PCP: Allyn Kenner MD REFERRING PROVIDER: Debbora Presto, NP   END OF SESSION:   PT End of Session - 10/22/21 1111     Visit Number 3    Number of Visits 12    Date for PT Re-Evaluation 11/26/21    Authorization Type [employer] authorization placed    Authorization Time Period 12 approved 5/11-6/22/23    Authorization - Visit Number 2    Authorization - Number of Visits 12    Progress Note Due on Visit 10    PT Start Time 1110    PT Stop Time 1150    PT Time Calculation (min) 40 min    Activity Tolerance Patient tolerated treatment well    Behavior During Therapy [facility] for tasks assessed/performed             Past Medical History:  Diagnosis Date   Aortic atherosclerosis ([place]) 09/11/2016   noted on CXR   Arthritis    Baker's cyst, left 2014   Small, Left   Bilateral leg numbness    Cataract    Bilateral   Diabetes mellitus without complication ([place])    Diverticulosis 10/09/2016   Noted on CT abd/pelvis   Gout    Grade I diastolic dysfunction 0000000   Noted on ECHO   Hepatic cyst 10/09/2016   Noted on CT abd/pelvis   History of iron deficiency anemia    History of migraine    Hypercholesteremia    Hypertension    Internal hemorrhoids    Lateral meniscal tear 2014   Left   LVH (left ventricular hypertrophy) 03/03/2018   noted on EKG   Obesity    PMB (postmenopausal bleeding)    Pneumonia 2018   Right upper lower   PONV (postoperative nausea and vomiting)    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   Right 1.3 cm, Noted on CT Chest, pt unaware   Renal cyst 10/09/2016   Noted on CT abd/pelvis   Thoracic aortic aneurysm ([place]) 12/09/2016   ascending 4.0 cm, noted on CT chest, pt unaware   Past Surgical History:  Procedure Laterality Date   ABDOMINAL HYSTERECTOMY     CATARACT EXTRACTION Right 09/2010    CATARACT EXTRACTION Left 10/2010   COLONOSCOPY     DILATION AND CURETTAGE OF UTERUS     TOTAL KNEE ARTHROPLASTY Right 04/21/2018   Procedure: TOTAL KNEE ARTHROPLASTY;  Surgeon: Earlie Server, MD;  [facility];  Service: Orthopedics;  Laterality: Right;  Adductor Block   Patient Active Problem List   Diagnosis Date Noted   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   Acute encephalopathy 04/22/2018   Bradypnea    Primary localized osteoarthritis of right knee 02/10/2018   Acute viral syndrome 10/06/2016   Weakness 10/06/2016   Hypercholesteremia 10/06/2016   Gout 10/06/2016   Diabetes mellitus without complication ([place]) [id number]   Viral syndrome 10/06/2016   Precordial chest pain 09/11/2016   Diabetes ([place]) 05/03/2016   HYPOKALEMIA 11/01/2008   HYPERGLYCEMIA, FASTING 11/01/2008   Type 2 diabetes mellitus with hyperlipidemia ([place]) 10/17/2008   GOUT, UNSPECIFIED 10/17/2008   OBESITY 10/17/2008   MIGRAINE HEADACHE 10/17/2008   Essential hypertension 10/17/2008   INTERNAL HEMORRHOIDS 10/17/2008   DIVERTICULOSIS, COLON 10/17/2008   ARTHRITIS 10/17/2008    REFERRING DIAG: G20 (ICD-10-CM) - Parkinson's disease ([place]) R29.898 (ICD-10-CM) - Weakness of both lower extremities   THERAPY  DIAG:  Muscle weakness (generalized)  Difficulty in walking, not elsewhere classified  PERTINENT HISTORY:  Rt knee surgery, Parkinsons   PRECAUTIONS: Fall  SUBJECTIVE: Everything going well. No issues today.   PAIN:  Are you having pain? No    OBJECTIVE:      COGNITION: Overall cognitive status: Within functional limits for tasks assessed             SENSATION: Not tested       MMT:     MMT Right 10/15/2021 Left 10/15/2021  Hip flexion 3+/5  3+/5  Hip extension 2-/5 2/5    Hip abduction 2/5  2+/5  Hip adduction      Hip internal rotation      Hip external rotation      Knee flexion 3/5 3/5  Knee extension 4/5 4/5  Ankle dorsiflexion 4+/5 4/5  Ankle plantarflexion      Ankle  inversion      Ankle eversion      (Blank rows = not tested)   Observation:  Pt is extremely fearful of falling when walking.    GAIT: Gait pattern: decreased step length- Right, decreased step length- Left, decreased hip/knee flexion- Right, decreased hip/knee flexion- Left, decreased ankle dorsiflexion- Right, and decreased ankle dorsiflexion- Left Distance walked: 154 Assistive device utilized: Walker - 4 wheeled Level of assistance: Modified independence     FUNCTIONAL TESTs:  30 seconds chair stand test unable to stand without UE assist.   With UE 3 in 30 seconds.  2 minute walk test: with 4 wheeled walker pt walks for 154 ft.  Single leg test :  unable         TODAY'S TREATMENT:  10/22/21 Nustep lv 1 (seat 8) 5 min for strength and sequencing)  Seated march 2 x10 Seated UE/ LE raise 2 x10  Seated palloff pressRTB 2 x 10 each  Heel raise 2 x 10  Sit to stand 2 x10  with min guard and verbal cues   10/19/21 Bridge 10 x 5"  Supine march x10  Supine hip abduction/ adduction isometric 10 x 5" Max A with supine to sit transfer  Sit to stand x10   Eval: Sit to stand x 10 Supine: Bridge x 10               Marching x 10      PATIENT EDUCATION: Education details: on therapy goals, HEP, exercise form and function  Person educated: Patient and Spouse Education method: Consulting civil engineer, Media planner, Verbal cues, and Handouts Education comprehension: verbalized understanding and returned demonstration     HOME EXERCISE PROGRAM:  Sit to stand x 10 Supine: Bridge x 10               Marching x 10     10/19/21 Supine hip abduction/ adduction isometrics    5/18 heel raise  GOALS: Goals reviewed with patient? No   SHORT TERM GOALS: Target date: 11/05/2021      Pt to be I in a HEP to be able to come sit to stand without UE assist Baseline: Goal status: INITIAL   2.  PT to be able to single leg stance B for 5 seconds to reduce risk of falling  Baseline:  Goal status:  INITIAL   3.  PT to be I in bed mobility  Baseline:  Goal status: INITIAL       LONG TERM GOALS: Target date: 11/26/2021  1.    Pt to be I in advanced HEP to be able to walk 226 ft in a two minute time period for improve ambulation.     Baseline:  Goal status: INITIAL   2.  Pt to be able to single leg stance for 10 seconds each to decrease risk of falling  Baseline:  Goal status: INITIAL   3.  PT LE strength to increase by one grade to allow pt to ascend and descend 3 steps with one hand hold  Baseline:  Goal status: INITIAL   4.  PT to be able to complete 7 sit to stand in a 30 second period of time. Baseline:  Goal status: INITIAL   ASSESSMENT:   CLINICAL IMPRESSION: Patient tolerated session well. Progressed seated core stabilization. Patient was challenged with alternating UE/ LE sequencing and required max verbal cues and demo. Patient apprehensive with sit to stands with out UE support, but used decreasing therapist assist using gait belt until patient able to comfortably stand form chair independently. Added to HEP and issued handout. Patient will continue to benefit from skilled therapy services to reduce remaining deficits and improve functional ability.       OBJECTIVE IMPAIRMENTS Abnormal gait, decreased activity tolerance, decreased balance, decreased mobility, difficulty walking, and decreased strength.    ACTIVITY LIMITATIONS cleaning, community activity, and shopping.    PERSONAL FACTORS Age and 1-2 comorbidities: Parkinson disease   are also affecting patient's functional outcome.      REHAB POTENTIAL: Good   CLINICAL DECISION MAKING: Stable/uncomplicated   EVALUATION COMPLEXITY: Moderate   PLAN: PT FREQUENCY: 2x/week   PT DURATION: 6 weeks   PLANNED INTERVENTIONS: Therapeutic exercises, Therapeutic activity, Neuromuscular re-education, Balance training, Gait training, and Patient/Family education   PLAN FOR NEXT SESSION: PWR, bed mobility  and strengthening exercises   11:20 AM, 10/22/21 Josue Hector PT DPT  Physical Therapist with [facility]  [phone]

## 2021-10-27 ENCOUNTER — Ambulatory Visit (HOSPITAL_COMMUNITY): Payer: Medicare HMO

## 2021-10-27 DIAGNOSIS — M6281 Muscle weakness (generalized): Secondary | ICD-10-CM | POA: Diagnosis not present

## 2021-10-27 DIAGNOSIS — R262 Difficulty in walking, not elsewhere classified: Secondary | ICD-10-CM | POA: Diagnosis not present

## 2021-10-27 DIAGNOSIS — R29898 Other symptoms and signs involving the musculoskeletal system: Secondary | ICD-10-CM | POA: Diagnosis not present

## 2021-10-27 DIAGNOSIS — G20 Parkinson's disease: Secondary | ICD-10-CM

## 2021-10-27 DIAGNOSIS — G20C Parkinsonism, unspecified: Secondary | ICD-10-CM

## 2021-10-27 NOTE — Therapy (Signed)
OUTPATIENT PHYSICAL THERAPY TREATMENT NOTE   Patient Name: Melanie BeamBessie Y Chatterjee MRN: [id number] DOB:03-20-1941, 81 y.o., female Today's Date: 10/27/2021  PCP: Nita SellsJohn Hall MD REFERRING PROVIDER: Shawnie DapperLomax, Nikan Ellingson, NP   END OF SESSION:   PT End of Session - 10/27/21 1453     Visit Number 4    Number of Visits 12    Date for PT Re-Evaluation 11/26/21    Authorization Type [employer] MCR authorization placed    Authorization Time Period 12 approved 5/11-6/22/23    Authorization - Visit Number 3    Authorization - Number of Visits 12    Progress Note Due on Visit 10    PT Start Time 1440    PT Stop Time 1520    PT Time Calculation (min) 40 min    Activity Tolerance Patient tolerated treatment well    Behavior [facility] for tasks assessed/performed              Past Medical History:  Diagnosis Date   Aortic atherosclerosis (HCC) 09/11/2016   noted on CXR   Arthritis    Baker's cyst, left 2014   Small, Left   Bilateral leg numbness    Cataract    Bilateral   Diabetes mellitus without complication (HCC)    Diverticulosis 10/09/2016   Noted on CT abd/pelvis   Gout    Grade I diastolic dysfunction 09/12/2016   Noted on ECHO   Hepatic cyst 10/09/2016   Noted on CT abd/pelvis   History of iron deficiency anemia    History of migraine    Hypercholesteremia    Hypertension    Internal hemorrhoids    Lateral meniscal tear 2014   Left   LVH (left ventricular hypertrophy) 03/03/2018   noted on EKG   Obesity    PMB (postmenopausal bleeding)    Pneumonia 2018   Right upper lower   PONV (postoperative nausea and vomiting)    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   Right 1.3 cm, Noted on CT Chest, pt unaware   Renal cyst 10/09/2016   Noted on CT abd/pelvis   Thoracic aortic aneurysm (HCC) 12/09/2016   ascending 4.0 cm, noted on CT chest, pt unaware   Past Surgical History:  Procedure Laterality Date   ABDOMINAL HYSTERECTOMY     CATARACT EXTRACTION Right 09/2010    CATARACT EXTRACTION Left 10/2010   COLONOSCOPY     DILATION AND CURETTAGE OF UTERUS     TOTAL KNEE ARTHROPLASTY Right 04/21/2018   Procedure: TOTAL KNEE ARTHROPLASTY;  Surgeon: Frederico Hammanaffrey, Daniel, MD;  Location: [facility];  Service: Orthopedics;  Laterality: Right;  Adductor Block   Patient Active Problem List   Diagnosis Date Noted   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   Acute encephalopathy 04/22/2018   Bradypnea    Primary localized osteoarthritis of right knee 02/10/2018   Acute viral syndrome 10/06/2016   Weakness 10/06/2016   Hypercholesteremia 10/06/2016   Gout 10/06/2016   Diabetes mellitus without complication (HCC) 10/06/2016   Viral syndrome 10/06/2016   Precordial chest pain 09/11/2016   Diabetes (HCC) 05/03/2016   HYPOKALEMIA 11/01/2008   HYPERGLYCEMIA, FASTING 11/01/2008   Type 2 diabetes mellitus with hyperlipidemia (HCC) 10/17/2008   GOUT, UNSPECIFIED 10/17/2008   OBESITY 10/17/2008   MIGRAINE HEADACHE 10/17/2008   Essential hypertension 10/17/2008   INTERNAL HEMORRHOIDS 10/17/2008   DIVERTICULOSIS, COLON 10/17/2008   ARTHRITIS 10/17/2008    REFERRING DIAG: G20 (ICD-10-CM) - Parkinson's disease (HCC) R29.898 (ICD-10-CM) - Weakness of both lower extremities  THERAPY DIAG:  Muscle weakness (generalized)  Difficulty in walking, not elsewhere classified  Primary parkinsonism ([place])  PERTINENT HISTORY:  Rt knee surgery, Parkinsons   PRECAUTIONS: Fall  SUBJECTIVE: patient reports no pain; no new falls. No issues with exercises at home.   PAIN:  Are you having pain? No    OBJECTIVE:      COGNITION: Overall cognitive status: Within functional limits for tasks assessed             SENSATION: Not tested       MMT:     MMT Right 10/15/2021 Left 10/15/2021  Hip flexion 3+/5  3+/5  Hip extension 2-/5 2/5    Hip abduction 2/5  2+/5  Hip adduction      Hip internal rotation      Hip external rotation      Knee flexion 3/5 3/5  Knee extension 4/5 4/5  Ankle  dorsiflexion 4+/5 4/5  Ankle plantarflexion      Ankle inversion      Ankle eversion      (Blank rows = not tested)   Observation:  Pt is extremely fearful of falling when walking.    GAIT: Gait pattern: decreased step length- Right, decreased step length- Left, decreased hip/knee flexion- Right, decreased hip/knee flexion- Left, decreased ankle dorsiflexion- Right, and decreased ankle dorsiflexion- Left Distance walked: 154 Assistive device utilized: Walker - 4 wheeled Level of assistance: Modified independence     FUNCTIONAL TESTs:  30 seconds chair stand test unable to stand without UE assist.   With UE 3 in 30 seconds.  2 minute walk test: with 4 wheeled walker pt walks for 154 ft.  Single leg test :  unable         TODAY'S TREATMENT:  10/27/21 Nustep lv 1 (seat 8) 5 min for strength and sequencing)  Seated marching x 10  Standing Standing marching x 10 with bilat ue support // bars Heel raises in // bars 2 x 10 Sidestepping using hands for balance // bars down and back 2 x 4" step toe taps 2 x 10 each with UE assist Stagger stance x 30" each no UE assist Sit to stand push up with arms and then raise arms overhead x 5; CGA      10/22/21 Nustep lv 1 (seat 8) 5 min for strength and sequencing)  Seated march 2 x10 Seated UE/ LE raise 2 x10  Seated palloff pressRTB 2 x 10 each  Heel raise 2 x 10  Sit to stand 2 x10  with min guard and verbal cues   10/19/21 Bridge 10 x 5"  Supine march x10  Supine hip abduction/ adduction isometric 10 x 5" Max A with supine to sit transfer  Sit to stand x10   Eval: Sit to stand x 10 Supine: Bridge x 10               Marching x 10      PATIENT EDUCATION: Education details: on therapy goals, HEP, exercise form and function  Person educated: Patient and Spouse Education method: Consulting civil engineer, Media planner, Verbal cues, and Handouts Education comprehension: verbalized understanding and returned demonstration     HOME  EXERCISE PROGRAM:  Sit to stand x 10 Supine: Bridge x 10               Marching x 10     10/19/21 Supine hip abduction/ adduction isometrics    5/18 heel raise  GOALS: Goals reviewed with patient? No  SHORT TERM GOALS: Target date: 11/05/2021      Pt to be I in a HEP to be able to come sit to stand without UE assist Baseline: Goal status: INITIAL   2.  PT to be able to single leg stance B for 5 seconds to reduce risk of falling  Baseline:  Goal status: INITIAL   3.  PT to be I in bed mobility  Baseline:  Goal status: INITIAL       LONG TERM GOALS: Target date: 11/26/2021            1.    Pt to be I in advanced HEP to be able to walk 226 ft in a two minute time period for improve ambulation.     Baseline:  Goal status: INITIAL   2.  Pt to be able to single leg stance for 10 seconds each to decrease risk of falling  Baseline:  Goal status: INITIAL   3.  PT LE strength to increase by one grade to allow pt to ascend and descend 3 steps with one hand hold  Baseline:  Goal status: INITIAL   4.  PT to be able to complete 7 sit to stand in a 30 second period of time. Baseline:  Goal status: INITIAL   ASSESSMENT:   CLINICAL IMPRESSION: Today's session focused on posture with functional movement; balance and strengthening.  Added sidestepping, toe taps. Patient very cautious with movement and needs repeated cues to hold her head and shoulders up.  Patient will continue to benefit from skilled therapy services to reduce remaining deficits and improve functional ability.       OBJECTIVE IMPAIRMENTS Abnormal gait, decreased activity tolerance, decreased balance, decreased mobility, difficulty walking, and decreased strength.    ACTIVITY LIMITATIONS cleaning, community activity, and shopping.    PERSONAL FACTORS Age and 1-2 comorbidities: Parkinson disease   are also affecting patient's functional outcome.      REHAB POTENTIAL: Good   CLINICAL DECISION MAKING:  Stable/uncomplicated   EVALUATION COMPLEXITY: Moderate   PLAN: PT FREQUENCY: 2x/week   PT DURATION: 6 weeks   PLANNED INTERVENTIONS: Therapeutic exercises, Therapeutic activity, Neuromuscular re-education, Balance training, Gait training, and Patient/Family education   PLAN FOR NEXT SESSION: PWR, bed mobility and strengthening exercises; balance   3:21 PM, 10/27/21 Bras[facility] Bund[facility] Small Dannica Bickham MPT [facility] [place] [phone] [id number]

## 2021-10-29 ENCOUNTER — Ambulatory Visit (HOSPITAL_COMMUNITY): Payer: Medicare HMO

## 2021-10-29 ENCOUNTER — Encounter (HOSPITAL_COMMUNITY): Payer: Self-pay

## 2021-10-29 DIAGNOSIS — G20 Parkinson's disease: Secondary | ICD-10-CM | POA: Diagnosis not present

## 2021-10-29 DIAGNOSIS — R29898 Other symptoms and signs involving the musculoskeletal system: Secondary | ICD-10-CM | POA: Diagnosis not present

## 2021-10-29 DIAGNOSIS — R262 Difficulty in walking, not elsewhere classified: Secondary | ICD-10-CM | POA: Diagnosis not present

## 2021-10-29 DIAGNOSIS — M6281 Muscle weakness (generalized): Secondary | ICD-10-CM | POA: Diagnosis not present

## 2021-10-29 NOTE — Therapy (Signed)
OUTPATIENT PHYSICAL THERAPY TREATMENT NOTE   Patient Name: [name] MRN: [id number] DOB:1940-10-11, 81 y.o., female Today's Date: 10/29/2021  PCP: Allyn Kenner MD REFERRING PROVIDER: Debbora Presto, NP   END OF SESSION:   PT End of Session - 10/29/21 0858     Visit Number 5    Number of Visits 12    Date for PT Re-Evaluation 11/26/21    Authorization Type [employer] MCR authorization placed    Authorization Time Period 12 approved 5/11-6/22/23    Authorization - Visit Number 4    Authorization - Number of Visits 12    Progress Note Due on Visit 10    PT Start Time 0900    PT Stop Time 0940    PT Time Calculation (min) 40 min    Activity Tolerance Patient tolerated treatment well    Behavior During Therapy [facility] for tasks assessed/performed              Past Medical History:  Diagnosis Date   Aortic atherosclerosis ([place]) 09/11/2016   noted on CXR   Arthritis    Baker's cyst, left 2014   Small, Left   Bilateral leg numbness    Cataract    Bilateral   Diabetes mellitus without complication ([place])    Diverticulosis 10/09/2016   Noted on CT abd/pelvis   Gout    Grade I diastolic dysfunction 0000000   Noted on ECHO   Hepatic cyst 10/09/2016   Noted on CT abd/pelvis   History of iron deficiency anemia    History of migraine    Hypercholesteremia    Hypertension    Internal hemorrhoids    Lateral meniscal tear 2014   Left   LVH (left ventricular hypertrophy) 03/03/2018   noted on EKG   Obesity    PMB (postmenopausal bleeding)    Pneumonia 2018   Right upper lower   PONV (postoperative nausea and vomiting)    Renal arterial aneurysm ([place]) 12/09/2016   Right 1.3 cm, Noted on CT Chest, pt unaware   Renal cyst 10/09/2016   Noted on CT abd/pelvis   Thoracic aortic aneurysm ([place]) 12/09/2016   ascending 4.0 cm, noted on CT chest, pt unaware   Past Surgical History:  Procedure Laterality Date   ABDOMINAL HYSTERECTOMY     CATARACT EXTRACTION Right 09/2010    CATARACT EXTRACTION Left 10/2010   COLONOSCOPY     DILATION AND CURETTAGE OF UTERUS     TOTAL KNEE ARTHROPLASTY Right 04/21/2018   Procedure: TOTAL KNEE ARTHROPLASTY;  Surgeon: Earlie Server, MD;  Location: [facility];  Service: Orthopedics;  Laterality: Right;  Adductor Block   Patient Active Problem List   Diagnosis Date Noted   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) [id number]   Viral syndrome 10/06/2016   Precordial chest pain 09/11/2016   Diabetes ([place]) 05/03/2016   HYPOKALEMIA 11/01/2008   HYPERGLYCEMIA, FASTING 11/01/2008   Type 2 diabetes mellitus with hyperlipidemia ([place]) 10/17/2008   GOUT, UNSPECIFIED 10/17/2008   OBESITY 10/17/2008   MIGRAINE HEADACHE 10/17/2008   Essential hypertension 10/17/2008   INTERNAL HEMORRHOIDS 10/17/2008   DIVERTICULOSIS, COLON 10/17/2008   ARTHRITIS 10/17/2008    REFERRING DIAG: G20 (ICD-10-CM) - Parkinson's disease ([place]) R29.898 (ICD-10-CM) - Weakness of both lower extremities  THERAPY DIAG:  Muscle weakness (generalized)  Difficulty in walking, not elsewhere classified  PERTINENT HISTORY:  Rt knee surgery, Parkinsons   PRECAUTIONS: Fall  SUBJECTIVE: patient reports all is well, no pain, home exercises "going good", tired after sessions but not bad  PAIN:  Are you having pain? No    OBJECTIVE:    Italics from evaluation on 10/15/21  COGNITION: Overall cognitive status: Within functional limits for tasks assessed             SENSATION: Not tested   MMT:     MMT Right 10/15/2021 Left 10/15/2021  Hip flexion 3+/5  3+/5  Hip extension 2-/5 2/5    Hip abduction 2/5  2+/5  Hip adduction      Hip internal rotation      Hip external rotation      Knee flexion 3/5 3/5  Knee  extension 4/5 4/5  Ankle dorsiflexion 4+/5 4/5  Ankle plantarflexion      Ankle inversion      Ankle eversion      (Blank rows = not tested)   Observation:  Pt is extremely fearful of falling when walking.    GAIT: Gait pattern: decreased step length- Right, decreased step length- Left, decreased hip/knee flexion- Right, decreased hip/knee flexion- Left, decreased ankle dorsiflexion- Right, and decreased ankle dorsiflexion- Left Distance walked: 154 Assistive device utilized: Walker - 4 wheeled Level of assistance: Modified independence     FUNCTIONAL TESTs:  30 seconds chair stand test unable to stand without UE assist.   With UE 3 in 30 seconds.  2 minute walk test: with 4 wheeled walker pt walks for 154 ft.  Single leg test :  unable         TODAY'S TREATMENT:  10/29/21: There-Act = Nustep lv 1 (seat 8) 8 min cont for strength and sequencing  = Sit to stand push up with arms and then raise arms overhead x 5 cue big; SBA  = Sit to stand trial with no arms, unable, then cued for minimizing use of B UE x 2, then retried no arms and able to x 1 with cue for moving big; CGA There-Ex = in // bars all with SBA Standing marching x 20 with B UE support  Heel raises 2 x 10 with B UE support Hip abduction 1 x 10 with B UE support Neuro-Re-ed = in // bars  Tandem stance x 30" each no UE assist SLS trial with B UE support eyes open, cued for minimal x 10 sec B  10/27/21 Nustep lv 1 (seat 8) 5 min for strength and sequencing)  Seated marching x 10  Standing Standing marching x 10 with bilat ue support // bars Heel raises in // bars 2 x 10 Sidestepping using hands for balance // bars down and back 2 x 4" step toe taps 2 x 10 each with UE assist Stagger stance x 30" each no UE assist Sit to stand push up with arms and then raise arms overhead x 5; CGA   10/22/21 Nustep lv 1 (seat 8) 5 min for strength and sequencing)  Seated march 2 x10 Seated UE/ LE raise 2 x10  Seated palloff  pressRTB 2 x 10 each  Heel raise 2 x 10  Sit to stand 2 x10  with min guard and verbal cues   10/19/21 Bridge 10 x 5"  Supine march x10  Supine hip abduction/ adduction isometric 10 x 5" Max A with supine to sit transfer  Sit to stand x10   Eval: Sit to stand x 10 Supine: Bridge x 10               Marching x 10      PATIENT EDUCATION: Education details: on therapy goals, HEP, exercise form and function 10/29/21: big movements, increased balance challenges for fall prevention, goal discussion Person educated: Patient and Spouse Education method: Explanation, Demonstration, Verbal cues, and Handouts Education comprehension: verbalized understanding and returned demonstration     HOME EXERCISE PROGRAM:  Sit to stand x 10 Supine: Bridge x 10               Marching x 10     10/19/21 Supine hip abduction/ adduction isometrics    5/18 heel raise   GOALS: Goals reviewed with patient? YES on 10/29/21   SHORT TERM GOALS: Target date: 11/05/2021      Pt to be I in a HEP to be able to come sit to stand without UE assist Baseline: Goal status: INITIAL   2.  PT to be able to single leg stance B for 5 seconds to reduce risk of falling  Baseline:  Goal status: INITIAL   3.  PT to be I in bed mobility  Baseline:  Goal status: INITIAL       LONG TERM GOALS: Target date: 11/26/2021            1.    Pt to be I in advanced HEP to be able to walk 226 ft in a two minute time period for improve ambulation.     Baseline:  Goal status: INITIAL   2.  Pt to be able to single leg stance for 10 seconds each to decrease risk of falling  Baseline:  Goal status: INITIAL   3.  PT LE strength to increase by one grade to allow pt to ascend and descend 3 steps with one hand hold  Baseline:  Goal status: INITIAL   4.  PT to be able to complete 7 sit to stand in a 30 second period of time. Baseline:  Goal status: INITIAL   ASSESSMENT:   CLINICAL IMPRESSION: Today's session continued to  build upon prior activities for increased strength and balance today to decrease overall fall risk.  Goals for improved balance utilized to trial more standing hip abduction exercises and single leg stance as well with overall fair patient tolerance.  During upright activities, patient utilizing heavy pressure onto B UE and needs continued cueing for light hands.  Trial of SLS shows inability to fully take hands off bars and needs continued practice to advance toward goal.    Patient will continue to benefit from skilled therapy services to reduce remaining deficits and improve functional ability.       OBJECTIVE IMPAIRMENTS Abnormal gait, decreased activity tolerance, decreased balance, decreased mobility, difficulty walking, and decreased strength.    ACTIVITY LIMITATIONS cleaning, community activity, and shopping.    PERSONAL FACTORS Age and 1-2 comorbidities: Parkinson disease   are also affecting patient's functional outcome.      REHAB POTENTIAL: Good   CLINICAL DECISION MAKING: Stable/uncomplicated   EVALUATION COMPLEXITY: Moderate   PLAN: PT FREQUENCY: 2x/week   PT DURATION: 6 weeks   PLANNED INTERVENTIONS: Therapeutic exercises, Therapeutic activity, Neuromuscular re-education, Balance training, Gait training, and Patient/Family education   PLAN FOR NEXT SESSION: PWR, bed mobility and strengthening exercises; balance    9:41 AM, 10/29/21  Margarette Asal. Carlis Abbott PT, DPT  Contract Physical Therapist  at  [facility] [phone]

## 2021-11-04 ENCOUNTER — Ambulatory Visit (HOSPITAL_COMMUNITY): Payer: Medicare HMO | Attending: Neurology | Admitting: Physical Therapy

## 2021-11-04 DIAGNOSIS — R262 Difficulty in walking, not elsewhere classified: Secondary | ICD-10-CM

## 2021-11-04 DIAGNOSIS — M6281 Muscle weakness (generalized): Secondary | ICD-10-CM

## 2021-11-04 DIAGNOSIS — G20C Parkinsonism, unspecified: Secondary | ICD-10-CM

## 2021-11-04 DIAGNOSIS — G20 Parkinson's disease: Secondary | ICD-10-CM

## 2021-11-04 DIAGNOSIS — R29898 Other symptoms and signs involving the musculoskeletal system: Secondary | ICD-10-CM | POA: Insufficient documentation

## 2021-11-04 NOTE — Therapy (Signed)
OUTPATIENT PHYSICAL THERAPY TREATMENT NOTE   Patient Name: [name] MRN: [id number] DOB:1941/04/01, 81 y.o., female Today's Date: 11/04/2021  PCP: Allyn Kenner MD REFERRING PROVIDER: Debbora Presto, NP   END OF SESSION:   PT End of Session - 11/04/21 1454     Visit Number 6    Number of Visits 12    Date for PT Re-Evaluation 11/26/21    Authorization Type [employer] MCR authorization placed    Authorization Time Period 12 approved 5/11-6/22/23    Authorization - Visit Number 5    Authorization - Number of Visits 12    Progress Note Due on Visit 10    PT Start Time 1450    PT Stop Time [id number]    PT Time Calculation (min) 40 min    Activity Tolerance Patient tolerated treatment well    Behavior During Therapy [facility] for tasks assessed/performed              Past Medical History:  Diagnosis Date   Aortic atherosclerosis ([place]) 09/11/2016   noted on CXR   Arthritis    Baker's cyst, left 2014   Small, Left   Bilateral leg numbness    Cataract    Bilateral   Diabetes mellitus without complication ([place])    Diverticulosis 10/09/2016   Noted on CT abd/pelvis   Gout    Grade I diastolic dysfunction 0000000   Noted on ECHO   Hepatic cyst 10/09/2016   Noted on CT abd/pelvis   History of iron deficiency anemia    History of migraine    Hypercholesteremia    Hypertension    Internal hemorrhoids    Lateral meniscal tear 2014   Left   LVH (left ventricular hypertrophy) 03/03/2018   noted on EKG   Obesity    PMB (postmenopausal bleeding)    Pneumonia 2018   Right upper lower   PONV (postoperative nausea and vomiting)    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   Right 1.3 cm, Noted on CT Chest, pt unaware   Renal cyst 10/09/2016   Noted on CT abd/pelvis   Thoracic aortic aneurysm ([place]) 12/09/2016   ascending 4.0 cm, noted on CT chest, pt unaware   Past Surgical History:  Procedure Laterality Date   ABDOMINAL HYSTERECTOMY     CATARACT EXTRACTION Right 09/2010    CATARACT EXTRACTION Left 10/2010   COLONOSCOPY     DILATION AND CURETTAGE OF UTERUS     TOTAL KNEE ARTHROPLASTY Right 04/21/2018   Procedure: TOTAL KNEE ARTHROPLASTY;  Surgeon: Earlie Server, MD;  Location: [facility];  Service: Orthopedics;  Laterality: Right;  Adductor Block   Patient Active Problem List   Diagnosis Date Noted   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   Acute encephalopathy 04/22/2018   Bradypnea    Primary localized osteoarthritis of right knee 02/10/2018   Acute viral syndrome 10/06/2016   Weakness 10/06/2016   Hypercholesteremia 10/06/2016   Gout 10/06/2016   Diabetes mellitus without complication ([place]) [id number]   Viral syndrome 10/06/2016   Precordial chest pain 09/11/2016   Diabetes ([place]) 05/03/2016   HYPOKALEMIA 11/01/2008   HYPERGLYCEMIA, FASTING 11/01/2008   Type 2 diabetes mellitus with hyperlipidemia ([place]) 10/17/2008   GOUT, UNSPECIFIED 10/17/2008   OBESITY 10/17/2008   MIGRAINE HEADACHE 10/17/2008   Essential hypertension 10/17/2008   INTERNAL HEMORRHOIDS 10/17/2008   DIVERTICULOSIS, COLON 10/17/2008   ARTHRITIS 10/17/2008    REFERRING DIAG: G20 (ICD-10-CM) - Parkinson's disease ([place]) R29.898 (ICD-10-CM) - Weakness of both lower extremities  THERAPY DIAG:  Muscle weakness (generalized)  Difficulty in walking, not elsewhere classified  Primary parkinsonism ([place])  PERTINENT HISTORY:  Rt knee surgery, Parkinsons   PRECAUTIONS: Fall  SUBJECTIVE: Pt reports no issues today.  Accompanied by her husband who was present for session.  PAIN:  Are you having pain? No    OBJECTIVE:    Italics from evaluation on 10/15/21  COGNITION: Overall cognitive status: Within functional limits for tasks assessed             SENSATION: Not tested   MMT:     MMT Right 10/15/2021 Left 10/15/2021  Hip flexion 3+/5  3+/5  Hip extension 2-/5 2/5    Hip abduction 2/5  2+/5  Hip adduction      Hip internal rotation      Hip external rotation      Knee flexion 3/5  3/5  Knee extension 4/5 4/5  Ankle dorsiflexion 4+/5 4/5  Ankle plantarflexion      Ankle inversion      Ankle eversion      (Blank rows = not tested)   Observation:  Pt is extremely fearful of falling when walking.    GAIT: Gait pattern: decreased step length- Right, decreased step length- Left, decreased hip/knee flexion- Right, decreased hip/knee flexion- Left, decreased ankle dorsiflexion- Right, and decreased ankle dorsiflexion- Left Distance walked: 154 Assistive device utilized: Walker - 4 wheeled Level of assistance: Modified independence     FUNCTIONAL TESTs:  30 seconds chair stand test unable to stand without UE assist.   With UE 3 in 30 seconds.  2 minute walk test: with 4 wheeled walker pt walks for 154 ft.  Single leg test :  unable         TODAY'S TREATMENT:  11/04/21 Standing: marching x 10 with bilat ue support // bars Heel raises in // bars 2 x 10 Hip abduction 15X Hip extension 15X Sidestepping  with minimal UE assist // bars down and back 2 x 4" step toe taps 2 x 10 each with alternating UE movements  tandem stance 2x 30" each LE lead on balance beam no UE assist Sit to stand push up with arms and then raise arms overhead x 5; CGA    10/29/21: There-Act = Nustep lv 1 (seat 8) 8 min cont for strength and sequencing  = Sit to stand push up with arms and then raise arms overhead x 5 cue big; SBA  = Sit to stand trial with no arms, unable, then cued for minimizing use of B UE x 2, then retried no arms and able to x 1 with cue for moving big; CGA There-Ex = in // bars all with SBA Standing marching x 20 with B UE support  Heel raises 2 x 10 with B UE support Hip abduction 1 x 10 with B UE support Neuro-Re-ed = in // bars  Tandem stance x 30" each no UE assist SLS trial with B UE support eyes open, cued for minimal x 10 sec B  10/27/21 Nustep lv 1 (seat 8) 5 min for strength and sequencing)  Seated marching x 10  Standing Standing marching x 10 with  bilat ue support // bars Heel raises in // bars 2 x 10 Sidestepping using hands for balance // bars down and back 2 x 4" step toe taps 2 x 10 each with UE assist Stagger stance x 30" each no UE assist Sit to stand push up with arms and then raise  arms overhead x 5; CGA   10/22/21 Nustep lv 1 (seat 8) 5 min for strength and sequencing)  Seated march 2 x10 Seated UE/ LE raise 2 x10  Seated palloff pressRTB 2 x 10 each  Heel raise 2 x 10  Sit to stand 2 x10  with min guard and verbal cues   10/19/21 Bridge 10 x 5"  Supine march x10  Supine hip abduction/ adduction isometric 10 x 5" Max A with supine to sit transfer  Sit to stand x10   Eval: Sit to stand x 10 Supine: Bridge x 10               Marching x 10      PATIENT EDUCATION: Education details: on therapy goals, HEP, exercise form and function 10/29/21: big movements, increased balance challenges for fall prevention, goal discussion Person educated: Patient and Spouse Education method: Explanation, Demonstration, Verbal cues, and Handouts Education comprehension: verbalized understanding and returned demonstration     HOME EXERCISE PROGRAM:  10/22/21:  heel raise   10/19/21: Supine hip abduction/ adduction isometrics  Eval: Sit to stand x 10, Supine Bridge x 10, Marching x 10     GOALS: Goals reviewed with patient? YES on 10/29/21   SHORT TERM GOALS: Target date: 11/05/2021      Pt to be I in a HEP to be able to come sit to stand without UE assist Baseline: Goal status: INITIAL   2.  PT to be able to single leg stance B for 5 seconds to reduce risk of falling  Baseline:  Goal status: INITIAL   3.  PT to be I in bed mobility  Baseline:  Goal status: INITIAL       LONG TERM GOALS: Target date: 11/26/2021            1.    Pt to be I in advanced HEP to be able to walk 226 ft in a two minute time period for improve ambulation.     Baseline:  Goal status: INITIAL   2.  Pt to be able to single leg stance for 10  seconds each to decrease risk of falling  Baseline:  Goal status: INITIAL   3.  PT LE strength to increase by one grade to allow pt to ascend and descend 3 steps with one hand hold  Baseline:  Goal status: INITIAL   4.  PT to be able to complete 7 sit to stand in a 30 second period of time. Baseline:  Goal status: INITIAL   ASSESSMENT:   CLINICAL IMPRESSION: Continued with focus on improving LE strength and stability.  Pt required postural cues throughout session as tends to increase forward bent posturing.  Increased difficulty of tandem stance onto foam balance to further challenge stability.  Pt unable to complete side stepping without UE assist even with therapist providing CGA.  Pt required several short seated rest breaks for recovery during session today.    Patient will continue to benefit from skilled therapy services to reduce remaining deficits and improve functional ability.       OBJECTIVE IMPAIRMENTS Abnormal gait, decreased activity tolerance, decreased balance, decreased mobility, difficulty walking, and decreased strength.    ACTIVITY LIMITATIONS cleaning, community activity, and shopping.    PERSONAL FACTORS Age and 1-2 comorbidities: Parkinson disease   are also affecting patient's functional outcome.      REHAB POTENTIAL: Good   CLINICAL DECISION MAKING: Stable/uncomplicated   EVALUATION COMPLEXITY: Moderate   PLAN: PT  FREQUENCY: 2x/week   PT DURATION: 6 weeks   PLANNED INTERVENTIONS: Therapeutic exercises, Therapeutic activity, Neuromuscular re-education, Balance training, Gait training, and Patient/Family education   PLAN FOR NEXT SESSION: PWR, bed mobility and strengthening exercises; balance    2:55 PM, 11/04/21 Teena Irani, PTA/CLT [place] Ph: [phone]

## 2021-11-05 ENCOUNTER — Ambulatory Visit (HOSPITAL_COMMUNITY): Payer: Medicare HMO | Attending: Family Medicine | Admitting: Physical Therapy

## 2021-11-05 DIAGNOSIS — M6281 Muscle weakness (generalized): Secondary | ICD-10-CM | POA: Insufficient documentation

## 2021-11-05 DIAGNOSIS — G20 Parkinson's disease: Secondary | ICD-10-CM | POA: Diagnosis not present

## 2021-11-05 DIAGNOSIS — R262 Difficulty in walking, not elsewhere classified: Secondary | ICD-10-CM | POA: Diagnosis not present

## 2021-11-05 NOTE — Therapy (Signed)
OUTPATIENT PHYSICAL THERAPY TREATMENT NOTE   Patient Name: [name] MRN: [id number] DOB:02-23-1941, 81 y.o., female Today's Date: 11/05/2021  PCP: Allyn Kenner MD REFERRING PROVIDER: Debbora Presto, NP   END OF SESSION:   PT End of Session - 11/05/21 0947     Visit Number 7    Number of Visits 12    Date for PT Re-Evaluation 11/26/21    Authorization Type [employer] MCR authorization placed    Authorization Time Period 12 approved 5/11-6/22/23    Authorization - Visit Number 7    Authorization - Number of Visits 12    Progress Note Due on Visit 10    PT Start Time 0925    PT Stop Time 1010    PT Time Calculation (min) 45 min    Equipment Utilized During Treatment Gait belt    Activity Tolerance Patient tolerated treatment well    Behavior During Therapy WFL for tasks assessed/performed              Past Medical History:  Diagnosis Date   Aortic atherosclerosis ([place]) 09/11/2016   noted on CXR   Arthritis    Baker's cyst, left 2014   Small, Left   Bilateral leg numbness    Cataract    Bilateral   Diabetes mellitus without complication ([place])    Diverticulosis 10/09/2016   Noted on CT abd/pelvis   Gout    Grade I diastolic dysfunction 0000000   Noted on ECHO   Hepatic cyst 10/09/2016   Noted on CT abd/pelvis   History of iron deficiency anemia    History of migraine    Hypercholesteremia    Hypertension    Internal hemorrhoids    Lateral meniscal tear 2014   Left   LVH (left ventricular hypertrophy) 03/03/2018   noted on EKG   Obesity    PMB (postmenopausal bleeding)    Pneumonia 2018   Right upper lower   PONV (postoperative nausea and vomiting)    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   Right 1.3 cm, Noted on CT Chest, pt unaware   Renal cyst 10/09/2016   Noted on CT abd/pelvis   Thoracic aortic aneurysm ([place]) 12/09/2016   ascending 4.0 cm, noted on CT chest, pt unaware   Past Surgical History:  Procedure Laterality Date   ABDOMINAL  HYSTERECTOMY     CATARACT EXTRACTION Right 09/2010   CATARACT EXTRACTION Left 10/2010   COLONOSCOPY     DILATION AND CURETTAGE OF UTERUS     TOTAL KNEE ARTHROPLASTY Right 04/21/2018   Procedure: TOTAL KNEE ARTHROPLASTY;  Surgeon: Earlie Server, MD;  Location: [facility];  Service: Orthopedics;  Laterality: Right;  Adductor Block   Patient Active Problem List   Diagnosis Date Noted   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   Acute encephalopathy 04/22/2018   Bradypnea    Primary localized osteoarthritis of right knee 02/10/2018   Acute viral syndrome 10/06/2016   Weakness 10/06/2016   Hypercholesteremia 10/06/2016   Gout 10/06/2016   Diabetes mellitus without complication ([place]) [id number]   Viral syndrome 10/06/2016   Precordial chest pain 09/11/2016   Diabetes ([place]) 05/03/2016   HYPOKALEMIA 11/01/2008   HYPERGLYCEMIA, FASTING 11/01/2008   Type 2 diabetes mellitus with hyperlipidemia ([place]) 10/17/2008   GOUT, UNSPECIFIED 10/17/2008   OBESITY 10/17/2008   MIGRAINE HEADACHE 10/17/2008   Essential hypertension 10/17/2008   INTERNAL HEMORRHOIDS 10/17/2008   DIVERTICULOSIS, COLON 10/17/2008   ARTHRITIS 10/17/2008    REFERRING DIAG: G20 (ICD-10-CM) - Parkinson's disease ([place]) R29.898 (  ICD-10-CM) - Weakness of both lower extremities   THERAPY DIAG:  Muscle weakness (generalized)  Difficulty in walking, not elsewhere classified  Primary parkinsonism ([place])  PERTINENT HISTORY:  Rt knee surgery, Parkinsons   PRECAUTIONS: Fall  SUBJECTIVE: Pt reports no issues today.  Accompanied by her husband who was present for session.  PAIN:  Are you having pain? No    OBJECTIVE:    Italics from evaluation on 10/15/21  COGNITION: Overall cognitive status: Within functional limits for tasks assessed             SENSATION: Not tested   MMT:     MMT Right 10/15/2021 Left 10/15/2021  Hip flexion 3+/5  3+/5  Hip extension 2-/5 2/5    Hip abduction 2/5  2+/5  Hip adduction      Hip internal  rotation      Hip external rotation      Knee flexion 3/5 3/5  Knee extension 4/5 4/5  Ankle dorsiflexion 4+/5 4/5  Ankle plantarflexion      Ankle inversion      Ankle eversion      (Blank rows = not tested)   Observation:  Pt is extremely fearful of falling when walking.    GAIT: Gait pattern: decreased step length- Right, decreased step length- Left, decreased hip/knee flexion- Right, decreased hip/knee flexion- Left, decreased ankle dorsiflexion- Right, and decreased ankle dorsiflexion- Left Distance walked: 154 Assistive device utilized: Walker - 4 wheeled Level of assistance: Modified independence     FUNCTIONAL TESTs:  30 seconds chair stand test unable to stand without UE assist.   With UE 3 in 30 seconds.  2 minute walk test: with 4 wheeled walker pt walks for 154 ft.  Single leg test :  unable         TODAY'S TREATMENT:           11/05/2021:          Bed mobility sit to side lying; side lying to sit x 10          Supine:    bridge x 10           POWER:  prone to POE, to POE with reaching with Rt the LT x 10                           Sidelying twist right with arm reaching up to ceiling; slap arm back to mat repeat to left.                           Sit to stand x 10                           Stand to squat to power up position.                             Side step with arms out with side step out, clapping at shoulder height with legs together x 2 Rt   11/04/21 Standing: marching x 10 with bilat ue support // bars Heel raises in // bars 2 x 10 Hip abduction 15X Hip extension 15X Sidestepping  with minimal UE assist // bars down and back 2 x 4" step toe taps 2 x 10 each with alternating UE movements  tandem stance 2x 30" each LE lead  on balance beam no UE assist Sit to stand push up with arms and then raise arms overhead x 5; CGA    10/29/21: There-Act = Nustep lv 1 (seat 8) 8 min cont for strength and sequencing  = Sit to stand push up with arms and then raise  arms overhead x 5 cue big; SBA  = Sit to stand trial with no arms, unable, then cued for minimizing use of B UE x 2, then retried no arms and able to x 1 with cue for moving big; CGA There-Ex = in // bars all with SBA Standing marching x 20 with B UE support  Heel raises 2 x 10 with B UE support Hip abduction 1 x 10 with B UE support Neuro-Re-ed = in // bars  Tandem stance x 30" each no UE assist SLS trial with B UE support eyes open, cued for minimal x 10 sec B  10/27/21 Nustep lv 1 (seat 8) 5 min for strength and sequencing)  Seated marching x 10  Standing Standing marching x 10 with bilat ue support // bars Heel raises in // bars 2 x 10 Sidestepping using hands for balance // bars down and back 2 x 4" step toe taps 2 x 10 each with UE assist Stagger stance x 30" each no UE assist Sit to stand push up with arms and then raise arms overhead x 5; CGA   10/22/21 Nustep lv 1 (seat 8) 5 min for strength and sequencing)  Seated march 2 x10 Seated UE/ LE raise 2 x10  Seated palloff pressRTB 2 x 10 each  Heel raise 2 x 10  Sit to stand 2 x10  with min guard and verbal cues   10/19/21 Bridge 10 x 5"  Supine march x10  Supine hip abduction/ adduction isometric 10 x 5" Max A with supine to sit transfer  Sit to stand x10   Eval: Sit to stand x 10 Supine: Bridge x 10               Marching x 10      PATIENT EDUCATION: Education details: on therapy goals, HEP, exercise form and function 10/29/21: big movements, increased balance challenges for fall prevention, goal discussion Person educated: Patient and Spouse Education method: Explanation, Demonstration, Verbal cues, and Handouts Education comprehension: verbalized understanding and returned demonstration     HOME EXERCISE PROGRAM:  10/22/21:  heel raise   10/19/21: Supine hip abduction/ adduction isometrics  Eval: Sit to stand x 10, Supine Bridge x 10, Marching x 10     GOALS: Goals reviewed with patient? YES on 10/29/21    SHORT TERM GOALS: Target date: 11/05/2021      Pt to be I in a HEP to be able to come sit to stand without UE assist Baseline: Goal status: INITIAL   2.  PT to be able to single leg stance B for 5 seconds to reduce risk of falling  Baseline:  Goal status: INITIAL   3.  PT to be I in bed mobility  Baseline:  Goal status: INITIAL       LONG TERM GOALS: Target date: 11/26/2021            1.    Pt to be I in advanced HEP to be able to walk 226 ft in a two minute time period for improve ambulation.     Baseline:  Goal status: INITIAL   2.  Pt to be able to single leg stance  for 10 seconds each to decrease risk of falling  Baseline:  Goal status: INITIAL   3.  PT LE strength to increase by one grade to allow pt to ascend and descend 3 steps with one hand hold  Baseline:  Goal status: INITIAL   4.  PT to be able to complete 7 sit to stand in a 30 second period of time. Baseline:  Goal status: INITIAL   ASSESSMENT:   CLINICAL IMPRESSION: Therapist worked on bed mobility with pt as they are using the head lift aspect on the bed to get pt in and out of bed.  Began POWER II prone, for parkinsons as well as standing With chest open and side stepping with arm motion to improve speed and power of the motion.  Pt will continue to benefit from skilled therapy for balance and large motion as well as POWER based exercises to improve safety of gait.     OBJECTIVE IMPAIRMENTS Abnormal gait, decreased activity tolerance, decreased balance, decreased mobility, difficulty walking, and decreased strength.    ACTIVITY LIMITATIONS cleaning, community activity, and shopping.    PERSONAL FACTORS Age and 1-2 comorbidities: Parkinson disease   are also affecting patient's functional outcome.      REHAB POTENTIAL: Good   CLINICAL DECISION MAKING: Stable/uncomplicated   EVALUATION COMPLEXITY: Moderate   PLAN: PT FREQUENCY: 2x/week   PT DURATION: 6 weeks   PLANNED INTERVENTIONS: Therapeutic  exercises, Therapeutic activity, Neuromuscular re-education, Balance training, Gait training, and Patient/Family education   PLAN FOR NEXT SESSION: PWR, bed mobility and strengthening exercises; balance    Rayetta Humphrey, [place] [phone]

## 2021-11-10 ENCOUNTER — Ambulatory Visit (HOSPITAL_COMMUNITY): Payer: Medicare HMO | Attending: Internal Medicine | Admitting: Physical Therapy

## 2021-11-10 DIAGNOSIS — G20 Parkinson's disease: Secondary | ICD-10-CM | POA: Insufficient documentation

## 2021-11-10 DIAGNOSIS — M6281 Muscle weakness (generalized): Secondary | ICD-10-CM | POA: Diagnosis not present

## 2021-11-10 DIAGNOSIS — R262 Difficulty in walking, not elsewhere classified: Secondary | ICD-10-CM | POA: Insufficient documentation

## 2021-11-10 NOTE — Therapy (Signed)
OUTPATIENT PHYSICAL THERAPY TREATMENT NOTE   Patient Name: [name] MRN: [id number] DOB:09-10-1940, 81 y.o., female Today's Date: 11/10/2021  PCP: Allyn Kenner MD REFERRING PROVIDER: Debbora Presto, NP   END OF SESSION:   PT End of Session - 11/10/21 1128     Visit Number 8    Number of Visits 12    Date for PT Re-Evaluation 11/26/21    Authorization Type [employer] MCR authorization placed    Authorization Time Period 12 approved 5/11-6/22/23    Authorization - Visit Number 8    Authorization - Number of Visits 12    Progress Note Due on Visit 10    PT Start Time [id number]    PT Stop Time 1208    PT Time Calculation (min) 43 min    Equipment Utilized During Treatment Gait belt    Activity Tolerance Patient tolerated treatment well    Behavior During Therapy WFL for tasks assessed/performed              Past Medical History:  Diagnosis Date   Aortic atherosclerosis ([place]) 09/11/2016   noted on CXR   Arthritis    Baker's cyst, left 2014   Small, Left   Bilateral leg numbness    Cataract    Bilateral   Diabetes mellitus without complication ([place])    Diverticulosis 10/09/2016   Noted on CT abd/pelvis   Gout    Grade I diastolic dysfunction 0000000   Noted on ECHO   Hepatic cyst 10/09/2016   Noted on CT abd/pelvis   History of iron deficiency anemia    History of migraine    Hypercholesteremia    Hypertension    Internal hemorrhoids    Lateral meniscal tear 2014   Left   LVH (left ventricular hypertrophy) 03/03/2018   noted on EKG   Obesity    PMB (postmenopausal bleeding)    Pneumonia 2018   Right upper lower   PONV (postoperative nausea and vomiting)    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   Right 1.3 cm, Noted on CT Chest, pt unaware   Renal cyst 10/09/2016   Noted on CT abd/pelvis   Thoracic aortic aneurysm ([place]) 12/09/2016   ascending 4.0 cm, noted on CT chest, pt unaware   Past Surgical History:  Procedure Laterality Date   ABDOMINAL  HYSTERECTOMY     CATARACT EXTRACTION Right 09/2010   CATARACT EXTRACTION Left 10/2010   COLONOSCOPY     DILATION AND CURETTAGE OF UTERUS     TOTAL KNEE ARTHROPLASTY Right 04/21/2018   Procedure: TOTAL KNEE ARTHROPLASTY;  Surgeon: Earlie Server, MD;  Location: [facility];  Service: Orthopedics;  Laterality: Right;  Adductor Block   Patient Active Problem List   Diagnosis Date Noted   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   Acute encephalopathy 04/22/2018   Bradypnea    Primary localized osteoarthritis of right knee 02/10/2018   Acute viral syndrome 10/06/2016   Weakness 10/06/2016   Hypercholesteremia 10/06/2016   Gout 10/06/2016   Diabetes mellitus without complication ([place]) [id number]   Viral syndrome 10/06/2016   Precordial chest pain 09/11/2016   Diabetes ([place]) 05/03/2016   HYPOKALEMIA 11/01/2008   HYPERGLYCEMIA, FASTING 11/01/2008   Type 2 diabetes mellitus with hyperlipidemia ([place]) 10/17/2008   GOUT, UNSPECIFIED 10/17/2008   OBESITY 10/17/2008   MIGRAINE HEADACHE 10/17/2008   Essential hypertension 10/17/2008   INTERNAL HEMORRHOIDS 10/17/2008   DIVERTICULOSIS, COLON 10/17/2008   ARTHRITIS 10/17/2008    REFERRING DIAG: G20 (ICD-10-CM) - Parkinson's disease ([place]) R29.898 (  ICD-10-CM) - Weakness of both lower extremities   THERAPY DIAG:  Muscle weakness (generalized)  Difficulty in walking, not elsewhere classified  Primary parkinsonism ([place])  PERTINENT HISTORY:  Rt knee surgery, Parkinsons   PRECAUTIONS: Fall  SUBJECTIVE: Pt reports the squats hurt her knees. States laying on stomach lifting her arms and body up are the most challenging for her.  Currently without any pain or issues.   Accompanied by her husband who was present for session.  PAIN:  Are you having pain? No    OBJECTIVE:    Italics from evaluation on 10/15/21  COGNITION: Overall cognitive status: Within functional limits for tasks assessed             SENSATION: Not tested   MMT:     MMT  Right 10/15/2021 Left 10/15/2021  Hip flexion 3+/5  3+/5  Hip extension 2-/5 2/5    Hip abduction 2/5  2+/5  Hip adduction      Hip internal rotation      Hip external rotation      Knee flexion 3/5 3/5  Knee extension 4/5 4/5  Ankle dorsiflexion 4+/5 4/5  Ankle plantarflexion      Ankle inversion      Ankle eversion      (Blank rows = not tested)   Observation:  Pt is extremely fearful of falling when walking.    GAIT: Gait pattern: decreased step length- Right, decreased step length- Left, decreased hip/knee flexion- Right, decreased hip/knee flexion- Left, decreased ankle dorsiflexion- Right, and decreased ankle dorsiflexion- Left Distance walked: 154 Assistive device utilized: Walker - 4 wheeled Level of assistance: Modified independence     FUNCTIONAL TESTs:  30 seconds chair stand test unable to stand without UE assist.   With UE 3 in 30 seconds.  2 minute walk test: with 4 wheeled walker pt walks for 154 ft.  Single leg test :  unable         TODAY'S TREATMENT:           11/10/21:  Log roll bed mobility  Supine: bridge 2X10   Dead bug with big movements 2X10  Prone:  power up posture;prone to POE 10X   Power "rock and reach" 5X each UE  Seated: sit to stand 10X with UE standing with UE flexion   Side step with UE's out with step, in with together and clap 2RT in front of large mat   11/05/2021:          Bed mobility sit to side lying; side lying to sit x 10          Supine:    bridge x 10           POWER:  prone to POE, to POE with reaching with Rt the LT x 10                           Sidelying twist right with arm reaching up to ceiling; slap arm back to mat repeat to left.                           Sit to stand x 10                           Stand to squat to power up position.  Side step with arms out with side step out, clapping at shoulder height with legs together x 2 Rt   11/04/21 Standing: marching x 10 with bilat ue support //  bars Heel raises in // bars 2 x 10 Hip abduction 15X Hip extension 15X Sidestepping  with minimal UE assist // bars down and back 2 x 4" step toe taps 2 x 10 each with alternating UE movements  tandem stance 2x 30" each LE lead on balance beam no UE assist Sit to stand push up with arms and then raise arms overhead x 5; CGA    10/29/21: There-Act = Nustep lv 1 (seat 8) 8 min cont for strength and sequencing  = Sit to stand push up with arms and then raise arms overhead x 5 cue big; SBA  = Sit to stand trial with no arms, unable, then cued for minimizing use of B UE x 2, then retried no arms and able to x 1 with cue for moving big; CGA There-Ex = in // bars all with SBA Standing marching x 20 with B UE support  Heel raises 2 x 10 with B UE support Hip abduction 1 x 10 with B UE support Neuro-Re-ed = in // bars  Tandem stance x 30" each no UE assist SLS trial with B UE support eyes open, cued for minimal x 10 sec B  10/27/21 Nustep lv 1 (seat 8) 5 min for strength and sequencing)  Seated marching x 10  Standing Standing marching x 10 with bilat ue support // bars Heel raises in // bars 2 x 10 Sidestepping using hands for balance // bars down and back 2 x 4" step toe taps 2 x 10 each with UE assist Stagger stance x 30" each no UE assist Sit to stand push up with arms and then raise arms overhead x 5; CGA   10/22/21 Nustep lv 1 (seat 8) 5 min for strength and sequencing)  Seated march 2 x10 Seated UE/ LE raise 2 x10  Seated palloff pressRTB 2 x 10 each  Heel raise 2 x 10  Sit to stand 2 x10  with min guard and verbal cues   10/19/21 Bridge 10 x 5"  Supine march x10  Supine hip abduction/ adduction isometric 10 x 5" Max A with supine to sit transfer  Sit to stand x10   Eval: Sit to stand x 10 Supine: Bridge x 10               Marching x 10      PATIENT EDUCATION: Education details: eval: on therapy goals, HEP, exercise form and function 10/29/21: big movements,  increased balance challenges for fall prevention, goal discussion Person educated: Patient and Spouse Education method: Explanation, Demonstration, Verbal cues, and Handouts Education comprehension: verbalized understanding and returned demonstration     HOME EXERCISE PROGRAM:  10/22/21:  heel raise   10/19/21: Supine hip abduction/ adduction isometrics  Eval: Sit to stand x 10, Supine Bridge x 10, Marching x 10     GOALS: Goals reviewed with patient? YES on 10/29/21   SHORT TERM GOALS: Target date: 11/05/2021      Pt to be I in a HEP to be able to come sit to stand without UE assist Baseline: Goal status: ONGOING   2.  PT to be able to single leg stance B for 5 seconds to reduce risk of falling  Baseline:  Goal status: ONGOING   3.  PT to be I  in bed mobility  Baseline:  Goal status: ONGOING       LONG TERM GOALS: Target date: 11/26/2021            1.    Pt to be I in advanced HEP to be able to walk 226 ft in a two minute time period for improve ambulation.     Baseline:  Goal status: ONGOING   2.  Pt to be able to single leg stance for 10 seconds each to decrease risk of falling  Baseline:  Goal status: ONGOING   3.  PT LE strength to increase by one grade to allow pt to ascend and descend 3 steps with one hand hold  Baseline:  Goal status: ONGOING   4.  PT to be able to complete 7 sit to stand in a 30 second period of time. Baseline:  Goal status: ONGOING   ASSESSMENT:   CLINICAL IMPRESSION: Continued focus on bed mobility, core and power up exercises.  Pt with most difficulty with transitional movements, I.e. supine to prone, pushing up onto elbows in prone and sitting up from sidelying.  Pt will stay "stuck" during transition with verbal cues requiring tactile cues to complete motion.  Pt able to complete sit to stand activity in good form and high reaching achieved with UE's.  Pt with difficulty coordinating movements and maintaining upright posturing with  sidestepping activitiy.  Requested to stay in front of mat as fearful of falling.  Pt will continue to benefit from skilled therapy for balance and large motion as well as POWER based exercises to improve safety of gait.     OBJECTIVE IMPAIRMENTS Abnormal gait, decreased activity tolerance, decreased balance, decreased mobility, difficulty walking, and decreased strength.    ACTIVITY LIMITATIONS cleaning, community activity, and shopping.    PERSONAL FACTORS Age and 1-2 comorbidities: Parkinson disease   are also affecting patient's functional outcome.      REHAB POTENTIAL: Good   CLINICAL DECISION MAKING: Stable/uncomplicated   EVALUATION COMPLEXITY: Moderate   PLAN: PT FREQUENCY: 2x/week   PT DURATION: 6 weeks   PLANNED INTERVENTIONS: Therapeutic exercises, Therapeutic activity, Neuromuscular re-education, Balance training, Gait training, and Patient/Family education   PLAN FOR NEXT SESSION: PWR, bed mobility and strengthening exercises; balance    Teena Irani, PTA/CLT [place] Ph: [phone]  Teena Irani, PTA 11/10/2021, 11:29 AM

## 2021-11-12 ENCOUNTER — Ambulatory Visit (HOSPITAL_COMMUNITY): Payer: Medicare HMO | Attending: Family Medicine | Admitting: Physical Therapy

## 2021-11-12 ENCOUNTER — Encounter (HOSPITAL_COMMUNITY): Payer: Self-pay | Admitting: Physical Therapy

## 2021-11-12 ENCOUNTER — Ambulatory Visit: Payer: Medicare HMO | Admitting: Family Medicine

## 2021-11-12 DIAGNOSIS — R262 Difficulty in walking, not elsewhere classified: Secondary | ICD-10-CM | POA: Insufficient documentation

## 2021-11-12 DIAGNOSIS — G20 Parkinson's disease: Secondary | ICD-10-CM | POA: Insufficient documentation

## 2021-11-12 DIAGNOSIS — M6281 Muscle weakness (generalized): Secondary | ICD-10-CM

## 2021-11-12 DIAGNOSIS — R29898 Other symptoms and signs involving the musculoskeletal system: Secondary | ICD-10-CM | POA: Diagnosis not present

## 2021-11-12 NOTE — Therapy (Signed)
OUTPATIENT PHYSICAL THERAPY TREATMENT NOTE   Patient Name: [name] MRN: [id number] DOB:1940-09-10, 81 y.o., female Today's Date: 11/12/2021  PCP: Allyn Kenner MD REFERRING PROVIDER: Debbora Presto, NP   END OF SESSION:   PT End of Session - 11/12/21 1126     Visit Number 9    Number of Visits 12    Date for PT Re-Evaluation 11/26/21    Authorization Type [employer] MCR authorization placed    Authorization Time Period 12 approved 5/11-6/22/23    Authorization - Visit Number 9    Authorization - Number of Visits 12    Progress Note Due on Visit 10    PT Start Time 1130    PT Stop Time 1210    PT Time Calculation (min) 40 min    Equipment Utilized During Treatment Gait belt    Activity Tolerance Patient tolerated treatment well    Behavior During Therapy WFL for tasks assessed/performed              Past Medical History:  Diagnosis Date   Aortic atherosclerosis ([place]) 09/11/2016   noted on CXR   Arthritis    Baker's cyst, left 2014   Small, Left   Bilateral leg numbness    Cataract    Bilateral   Diabetes mellitus without complication ([place])    Diverticulosis 10/09/2016   Noted on CT abd/pelvis   Gout    Grade I diastolic dysfunction 0000000   Noted on ECHO   Hepatic cyst 10/09/2016   Noted on CT abd/pelvis   History of iron deficiency anemia    History of migraine    Hypercholesteremia    Hypertension    Internal hemorrhoids    Lateral meniscal tear 2014   Left   LVH (left ventricular hypertrophy) 03/03/2018   noted on EKG   Obesity    PMB (postmenopausal bleeding)    Pneumonia 2018   Right upper lower   PONV (postoperative nausea and vomiting)    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   Right 1.3 cm, Noted on CT Chest, pt unaware   Renal cyst 10/09/2016   Noted on CT abd/pelvis   Thoracic aortic aneurysm ([place]) 12/09/2016   ascending 4.0 cm, noted on CT chest, pt unaware   Past Surgical History:  Procedure Laterality Date   ABDOMINAL  HYSTERECTOMY     CATARACT EXTRACTION Right 09/2010   CATARACT EXTRACTION Left 10/2010   COLONOSCOPY     DILATION AND CURETTAGE OF UTERUS     TOTAL KNEE ARTHROPLASTY Right 04/21/2018   Procedure: TOTAL KNEE ARTHROPLASTY;  Surgeon: Earlie Server, MD;  Location: [facility];  Service: Orthopedics;  Laterality: Right;  Adductor Block   Patient Active Problem List   Diagnosis Date Noted   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   Acute encephalopathy 04/22/2018   Bradypnea    Primary localized osteoarthritis of right knee 02/10/2018   Acute viral syndrome 10/06/2016   Weakness 10/06/2016   Hypercholesteremia 10/06/2016   Gout 10/06/2016   Diabetes mellitus without complication ([place]) [id number]   Viral syndrome 10/06/2016   Precordial chest pain 09/11/2016   Diabetes ([place]) 05/03/2016   HYPOKALEMIA 11/01/2008   HYPERGLYCEMIA, FASTING 11/01/2008   Type 2 diabetes mellitus with hyperlipidemia ([place]) 10/17/2008   GOUT, UNSPECIFIED 10/17/2008   OBESITY 10/17/2008   MIGRAINE HEADACHE 10/17/2008   Essential hypertension 10/17/2008   INTERNAL HEMORRHOIDS 10/17/2008   DIVERTICULOSIS, COLON 10/17/2008   ARTHRITIS 10/17/2008    REFERRING DIAG: G20 (ICD-10-CM) - Parkinson's disease ([place]) R29.898 (  ICD-10-CM) - Weakness of both lower extremities   THERAPY DIAG:  Muscle weakness (generalized)  Difficulty in walking, not elsewhere classified  Primary parkinsonism ([place])  PERTINENT HISTORY:  Rt knee surgery, Parkinsons   PRECAUTIONS: Fall  SUBJECTIVE:  PAIN:  Are you having pain? No    OBJECTIVE:    Italics from evaluation on 10/15/21  COGNITION: Overall cognitive status: Within functional limits for tasks assessed             SENSATION: Not tested   MMT:     MMT Right 10/15/2021 Left 10/15/2021  Hip flexion 3+/5  3+/5  Hip extension 2-/5 2/5    Hip abduction 2/5  2+/5  Hip adduction      Hip internal rotation      Hip external rotation      Knee flexion 3/5 3/5  Knee extension 4/5 4/5   Ankle dorsiflexion 4+/5 4/5  Ankle plantarflexion      Ankle inversion      Ankle eversion      (Blank rows = not tested)   Observation:  Pt is extremely fearful of falling when walking.    GAIT: Gait pattern: decreased step length- Right, decreased step length- Left, decreased hip/knee flexion- Right, decreased hip/knee flexion- Left, decreased ankle dorsiflexion- Right, and decreased ankle dorsiflexion- Left Distance walked: 154 Assistive device utilized: Walker - 4 wheeled Level of assistance: Modified independence     FUNCTIONAL TESTs:  30 seconds chair stand test unable to stand without UE assist.   With UE 3 in 30 seconds.  2 minute walk test: with 4 wheeled walker pt walks for 154 ft.  Single leg test :  unable         TODAY'S TREATMENT:               11/12/21              Standing:              Side step with large arms x 2 reps               Monster walk large arms large legs x 2 Reps across the room               Square walk x 3              Supine:  bridge hold x 5 x 15                            Dead bug with big motion x 10              Prone power up, power rock and reach x 5               Sidelying twist right with arm reaching up to ceiling; slap arm back to mat repeat to left.          11/10/21:  Log roll bed mobility  Supine: bridge 2X10   Dead bug with big movements 2X10  Prone:  power up posture;prone to POE 10X   Power "rock and reach" 5X each UE  Seated: sit to stand 10X with UE standing with UE flexion   Side step with UE's out with step, in with together and clap 2RT in front of large mat   11/05/2021:          Bed mobility sit to side lying; side lying to sit x  10          Supine:    bridge x 10           POWER:  prone to POE, to POE with reaching with Rt the LT x 10                           Sidelying twist right with arm reaching up to ceiling; slap arm back to mat repeat to left.                           Sit to stand x 10                            Stand to squat to power up position.                             Side step with arms out with side step out, clapping at shoulder height with legs together x 2 Rt   11/04/21 Standing: marching x 10 with bilat ue support // bars Heel raises in // bars 2 x 10 Hip abduction 15X Hip extension 15X Sidestepping  with minimal UE assist // bars down and back 2 x 4" step toe taps 2 x 10 each with alternating UE movements  tandem stance 2x 30" each LE lead on balance beam no UE assist Sit to stand push up with arms and then raise arms overhead x 5; CGA    10/29/21: There-Act = Nustep lv 1 (seat 8) 8 min cont for strength and sequencing  = Sit to stand push up with arms and then raise arms overhead x 5 cue big; SBA  = Sit to stand trial with no arms, unable, then cued for minimizing use of B UE x 2, then retried no arms and able to x 1 with cue for moving big; CGA There-Ex = in // bars all with SBA Standing marching x 20 with B UE support  Heel raises 2 x 10 with B UE support Hip abduction 1 x 10 with B UE support Neuro-Re-ed = in // bars  Tandem stance x 30" each no UE assist SLS trial with B UE support eyes open, cued for minimal x 10 sec B  10/27/21 Nustep lv 1 (seat 8) 5 min for strength and sequencing)  Seated marching x 10  Standing Standing marching x 10 with bilat ue support // bars Heel raises in // bars 2 x 10 Sidestepping using hands for balance // bars down and back 2 x 4" step toe taps 2 x 10 each with UE assist Stagger stance x 30" each no UE assist Sit to stand push up with arms and then raise arms overhead x 5; CGA   10/22/21 Nustep lv 1 (seat 8) 5 min for strength and sequencing)  Seated march 2 x10 Seated UE/ LE raise 2 x10  Seated palloff pressRTB 2 x 10 each  Heel raise 2 x 10  Sit to stand 2 x10  with min guard and verbal cues   10/19/21 Bridge 10 x 5"  Supine march x10  Supine hip abduction/ adduction isometric 10 x 5" Max A with supine to sit transfer   Sit to stand x10   Eval: Sit to stand x 10 Supine: Bridge x 10  Marching x 10      PATIENT EDUCATION: Education details: eval: on therapy goals, HEP, exercise form and function 10/29/21: big movements, increased balance challenges for fall prevention, goal discussion Person educated: Patient and Spouse Education method: Explanation, Demonstration, Verbal cues, and Handouts Education comprehension: verbalized understanding and returned demonstration     HOME EXERCISE PROGRAM:  10/22/21:  heel raise   10/19/21: Supine hip abduction/ adduction isometrics  Eval: Sit to stand x 10, Supine Bridge x 10, Marching x 10     GOALS: Goals reviewed with patient? YES on 10/29/21   SHORT TERM GOALS: Target date: 11/05/2021      Pt to be I in a HEP to be able to come sit to stand without UE assist Baseline: Goal status: ONGOING   2.  PT to be able to single leg stance B for 5 seconds to reduce risk of falling  Baseline:  Goal status: ONGOING   3.  PT to be I in bed mobility  Baseline:  Goal status: ONGOING       LONG TERM GOALS: Target date: 11/26/2021            1.    Pt to be I in advanced HEP to be able to walk 226 ft in a two minute time period for improve ambulation.     Baseline:  Goal status: ONGOING   2.  Pt to be able to single leg stance for 10 seconds each to decrease risk of falling  Baseline:  Goal status: ONGOING   3.  PT LE strength to increase by one grade to allow pt to ascend and descend 3 steps with one hand hold  Baseline:  Goal status: ONGOING   4.  PT to be able to complete 7 sit to stand in a 30 second period of time. Baseline:  Goal status: ONGOING   ASSESSMENT:   CLINICAL IMPRESSION:  Therapist worked on large leg and arm motions while standing with monster walk and large side stepping.  Pt is able to stand taller for a longer period of time.      Pt will continue to benefit from skilled therapy for balance and large motion as well as  POWER based exercises to improve safety of gait.     OBJECTIVE IMPAIRMENTS Abnormal gait, decreased activity tolerance, decreased balance, decreased mobility, difficulty walking, and decreased strength.    ACTIVITY LIMITATIONS cleaning, community activity, and shopping.    PERSONAL FACTORS Age and 1-2 comorbidities: Parkinson disease   are also affecting patient's functional outcome.      REHAB POTENTIAL: Good   CLINICAL DECISION MAKING: Stable/uncomplicated   EVALUATION COMPLEXITY: Moderate   PLAN: PT FREQUENCY: 2x/week   PT DURATION: 6 weeks   PLANNED INTERVENTIONS: Therapeutic exercises, Therapeutic activity, Neuromuscular re-education, Balance training, Gait training, and Patient/Family education   PLAN FOR NEXT SESSION: PWR, bed mobility and strengthening exercises; balance  Rayetta Humphrey, PT CLT [phone]  11/12/2021, 11:27 AM

## 2021-11-17 ENCOUNTER — Ambulatory Visit (HOSPITAL_COMMUNITY): Payer: Medicare HMO | Attending: Orthopedic Surgery | Admitting: Physical Therapy

## 2021-11-17 DIAGNOSIS — R29898 Other symptoms and signs involving the musculoskeletal system: Secondary | ICD-10-CM | POA: Insufficient documentation

## 2021-11-17 DIAGNOSIS — R262 Difficulty in walking, not elsewhere classified: Secondary | ICD-10-CM | POA: Insufficient documentation

## 2021-11-17 DIAGNOSIS — M6281 Muscle weakness (generalized): Secondary | ICD-10-CM | POA: Diagnosis not present

## 2021-11-17 DIAGNOSIS — G20 Parkinson's disease: Secondary | ICD-10-CM | POA: Insufficient documentation

## 2021-11-17 NOTE — Therapy (Cosign Needed Addendum)
OUTPATIENT PHYSICAL THERAPY TREATMENT NOTE Progress Note Reporting Period 10/15/21 to 11/17/21  See note below for Objective Data and Assessment of Progress/Goals.      Patient Name: [name] MRN: [id number] DOB:[birth year], 81 y.o., female Today's Date: 11/17/2021  PCP: Allyn Kenner MD REFERRING PROVIDER: Debbora Presto, NP   END OF SESSION:   PT End of Session - 11/17/21 1142     Visit Number 10    Number of Visits 12    Date for PT Re-Evaluation 11/26/21    Authorization Type [employer] MCR authorization placed    Authorization Time Period 12 approved 5/11-6/22/23    Authorization - Visit Number 10    Authorization - Number of Visits 12    Progress Note Due on Visit 10    PT Start Time 1134    PT Stop Time 1216    PT Time Calculation (min) 42 min    Equipment Utilized During Treatment Gait belt    Activity Tolerance Patient tolerated treatment well    Behavior During Therapy WFL for tasks assessed/performed              Past Medical History:  Diagnosis Date   Aortic atherosclerosis ([place]) 09/11/2016   noted on CXR   Arthritis    Baker's cyst, left 2014   Small, Left   Bilateral leg numbness    Cataract    Bilateral   Diabetes mellitus without complication ([place])    Diverticulosis 10/09/2016   Noted on CT abd/pelvis   Gout    Grade I diastolic dysfunction 20/94/7096   Noted on ECHO   Hepatic cyst 10/09/2016   Noted on CT abd/pelvis   History of iron deficiency anemia    History of migraine    Hypercholesteremia    Hypertension    Internal hemorrhoids    Lateral meniscal tear 2014   Left   LVH (left ventricular hypertrophy) 03/03/2018   noted on EKG   Obesity    PMB (postmenopausal bleeding)    Pneumonia 2018   Right upper lower   PONV (postoperative nausea and vomiting)    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   Right 1.3 cm, Noted on CT Chest, pt unaware   Renal cyst 10/09/2016   Noted on CT abd/pelvis   Thoracic aortic aneurysm ([place])  12/09/2016   ascending 4.0 cm, noted on CT chest, pt unaware   Past Surgical History:  Procedure Laterality Date   ABDOMINAL HYSTERECTOMY     CATARACT EXTRACTION Right 09/2010   CATARACT EXTRACTION Left 10/2010   COLONOSCOPY     DILATION AND CURETTAGE OF UTERUS     TOTAL KNEE ARTHROPLASTY Right 04/21/2018   Procedure: TOTAL KNEE ARTHROPLASTY;  Surgeon: Earlie Server, MD;  Location: [facility];  Service: Orthopedics;  Laterality: Right;  Adductor Block   Patient Active Problem List   Diagnosis Date Noted   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   Acute encephalopathy 04/22/2018   Bradypnea    Primary localized osteoarthritis of right knee 02/10/2018   Acute viral syndrome 10/06/2016   Weakness 10/06/2016   Hypercholesteremia 10/06/2016   Gout 10/06/2016   Diabetes mellitus without complication ([place]) 28/36/6294   Viral syndrome 10/06/2016   Precordial chest pain 09/11/2016   Diabetes ([place]) 05/03/2016   HYPOKALEMIA 11/01/2008   HYPERGLYCEMIA, FASTING 11/01/2008   Type 2 diabetes mellitus with hyperlipidemia ([place]) 10/17/2008   GOUT, UNSPECIFIED 10/17/2008   OBESITY 10/17/2008   MIGRAINE HEADACHE 10/17/2008   Essential hypertension 10/17/2008   INTERNAL HEMORRHOIDS 10/17/2008  DIVERTICULOSIS, COLON 10/17/2008   ARTHRITIS 10/17/2008    REFERRING DIAG: G20 (ICD-10-CM) - Parkinson's disease ([place]) R29.898 (ICD-10-CM) - Weakness of both lower extremities   THERAPY DIAG:  Muscle weakness (generalized)  Difficulty in walking, not elsewhere classified  Primary parkinsonism ([place])  PERTINENT HISTORY:  Rt knee surgery, Parkinsons   PRECAUTIONS: Fall  SUBJECTIVE: Pt states her Rt shoulder is hurting worse since beginning the exercises on her stomach, "I can't get on my stomach like that and push on my arms"  Reports pain in her Rt shoulder that has gotten worse and also her Lt lateral knee hurts at times.  Pt states she feels her legs are a little stronger.  Spouse reports he doesn't see much  improvement but she has been completing her HEP.   PAIN:  Are you having pain? No and Yes: NPRS scale: 7/10 Pain location: Rt shoulder Pain description: sharp stabbing Aggravating factors: raising her arm or putting weight through it Relieving factors: rest    OBJECTIVE:    Italics from evaluation on 10/15/21  COGNITION: Overall cognitive status: Within functional limits for tasks assessed             SENSATION: Not tested   MMT:     MMT Right 10/15/2021 Right 11/17/21 Left 10/15/2021 Left 11/17/21  Hip flexion 3+/5  4+/5 3+/5 4/5  Hip extension 2-/5 2-/5 2/5   2/5  Hip abduction 2/5  2/5 2+/5 2+/5  Hip adduction        Hip internal rotation        Hip external rotation        Knee flexion 3/5 4-/5 3/5 4-/5  Knee extension 4/5 5/5 4/5 5/5  Ankle dorsiflexion 4+/5 4+/5 4/5 4+/5  Ankle plantarflexion        Ankle inversion        Ankle eversion        (Blank rows = not tested)   Observation:  Pt is extremely fearful of falling when walking.    GAIT: Gait pattern: decreased step length- Right, decreased step length- Left, decreased hip/knee flexion- Right, decreased hip/knee flexion- Left, decreased ankle dorsiflexion- Right, and decreased ankle dorsiflexion- Left Distance walked: 100 feet Assistive device utilized: SPC Level of assistance: Min assist     FUNCTIONAL TESTs:  30 seconds chair stand test: 4X without UE from Edge of mat  ( at evaluation; unable to stand without UE assist.  With UE 3 in 30 seconds.)  2 minute walk test: 6/13:50 feet with SPC  (at eval: 4 wheeled walker pt walks for 154 ft).  Single leg test :  unable         TODAY'S TREATMENT:              11/17/21  Gait training with SPC  Progress note testing measures   11/12/21              Standing:              Side step with large arms x 2 reps               Monster walk large arms large legs x 2 Reps across the room               Square walk x 3              Supine:  bridge hold x 5 x 15  Dead bug with big motion x 10              Prone power up, power rock and reach x 5               Sidelying twist right with arm reaching up to ceiling; slap arm back to mat repeat to left.           11/10/21:  Log roll bed mobility  Supine: bridge 2X10   Dead bug with big movements 2X10  Prone:  power up posture;prone to POE 10X   Power "rock and reach" 5X each UE  Seated: sit to stand 10X with UE standing with UE flexion   Side step with UE's out with step, in with together and clap 2RT in front of large mat   11/05/2021:          Bed mobility sit to side lying; side lying to sit x 10          Supine:    bridge x 10           POWER:  prone to POE, to POE with reaching with Rt the LT x 10                           Sidelying twist right with arm reaching up to ceiling; slap arm back to mat repeat to left.                           Sit to stand x 10                           Stand to squat to power up position.                             Side step with arms out with side step out, clapping at shoulder height with legs together x 2 Rt   11/04/21 Standing: marching x 10 with bilat ue support // bars Heel raises in // bars 2 x 10 Hip abduction 15X Hip extension 15X Sidestepping  with minimal UE assist // bars down and back 2 x 4" step toe taps 2 x 10 each with alternating UE movements  tandem stance 2x 30" each LE lead on balance beam no UE assist Sit to stand push up with arms and then raise arms overhead x 5; CGA    10/29/21: There-Act = Nustep lv 1 (seat 8) 8 min cont for strength and sequencing  = Sit to stand push up with arms and then raise arms overhead x 5 cue big; SBA  = Sit to stand trial with no arms, unable, then cued for minimizing use of B UE x 2, then retried no arms and able to x 1 with cue for moving big; CGA There-Ex = in // bars all with SBA Standing marching x 20 with B UE support  Heel raises 2 x 10 with B UE support Hip abduction 1 x 10  with B UE support Neuro-Re-ed = in // bars  Tandem stance x 30" each no UE assist SLS trial with B UE support eyes open, cued for minimal x 10 sec B  10/27/21 Nustep lv 1 (seat 8) 5 min for strength and sequencing)  Seated marching x 10  Standing Standing marching x 10 with  bilat ue support // bars Heel raises in // bars 2 x 10 Sidestepping using hands for balance // bars down and back 2 x 4" step toe taps 2 x 10 each with UE assist Stagger stance x 30" each no UE assist Sit to stand push up with arms and then raise arms overhead x 5; CGA   10/22/21 Nustep lv 1 (seat 8) 5 min for strength and sequencing)  Seated march 2 x10 Seated UE/ LE raise 2 x10  Seated palloff pressRTB 2 x 10 each  Heel raise 2 x 10  Sit to stand 2 x10  with min guard and verbal cues   10/19/21 Bridge 10 x 5"  Supine march x10  Supine hip abduction/ adduction isometric 10 x 5" Max A with supine to sit transfer  Sit to stand x10   Eval: Sit to stand x 10 Supine: Bridge x 10               Marching x 10      PATIENT EDUCATION: Education details: eval: on therapy goals, HEP, exercise form and function 10/29/21: big movements, increased balance challenges for fall prevention, goal discussion Person educated: Patient and Spouse Education method: Explanation, Demonstration, Verbal cues, and Handouts Education comprehension: verbalized understanding and returned demonstration     HOME EXERCISE PROGRAM:  10/22/21:  heel raise   10/19/21: Supine hip abduction/ adduction isometrics  Eval: Sit to stand x 10, Supine Bridge x 10, Marching x 10     GOALS: Goals reviewed with patient? YES on 10/29/21   SHORT TERM GOALS: Target date: 11/05/2021      Pt to be I in a HEP to be able to come sit to stand without UE assist Baseline: Goal status: PARTLY MET (has to get going first/confidence)   2.  PT to be able to single leg stance B for 5 seconds to reduce risk of falling  Baseline:  Goal status: ONGOING    3.  PT to be I in bed mobility  Baseline:  Goal status: PARTLY MET (spouse states he has to assist sometimes, but mostly uses remote on bed)       LONG TERM GOALS: Target date: 11/26/2021            1.    Pt to be I in advanced HEP to be able to walk 226 ft in a two minute time period for improve ambulation.     Baseline:  Goal status: ONGOING   2.  Pt to be able to single leg stance for 10 seconds each to decrease risk of falling  Baseline:  Goal status: ONGOING   3.  PT LE strength to increase by one grade to allow pt to ascend and descend 3 steps with one hand hold  Baseline:  Goal status: PARTLY MET (has met for hamstring,quad and hip flexion only)   4.  PT to be able to complete 7 sit to stand in a 30 second period of time. Baseline:  Goal status: ONGOING (completed 4 today)   ASSESSMENT:   CLINICAL IMPRESSION:  Progress note completed this session for 10th visit.  Pt has made improvements in hamstring, quadriceps and hip flexor strength.  Still having processing difficulties for completing transitional movements and activities and overall slow mobility.  Did progress to [facility] with ability to complete without LOB and max cues for sequencing and posturing.  Pt tends to need tactile cues to complete all task as verbal cues are not sufficient  to acquire a movement.  Discussed this with spouse and pt and the importance of her completing all tasks with little assistance at home to help build on her progress.   Pt will continue to benefit from skilled therapy for balance and large motion as well as POWER based exercises to improve safety of gait.     OBJECTIVE IMPAIRMENTS Abnormal gait, decreased activity tolerance, decreased balance, decreased mobility, difficulty walking, and decreased strength.    ACTIVITY LIMITATIONS cleaning, community activity, and shopping.    PERSONAL FACTORS Age and 1-2 comorbidities: Parkinson disease   are also affecting patient's functional outcome.       REHAB POTENTIAL: Good   CLINICAL DECISION MAKING: Stable/uncomplicated   EVALUATION COMPLEXITY: Moderate   PLAN: PT FREQUENCY: 2x/week   PT DURATION: 6 weeks   PLANNED INTERVENTIONS: Therapeutic exercises, Therapeutic activity, Neuromuscular re-education, Balance training, Gait training, and Patient/Family education   PLAN FOR NEXT SESSION: PWR, bed mobility and strengthening exercises; balance.  Reassess in 2 more visits.   Teena Irani, PTA/CLT Rayetta Humphrey, PT CLT [place] Ph: [phone] 11/17/2021, 1:06 PM

## 2021-11-19 ENCOUNTER — Encounter (HOSPITAL_COMMUNITY): Payer: Self-pay

## 2021-11-19 ENCOUNTER — Ambulatory Visit (HOSPITAL_COMMUNITY): Payer: Medicare HMO | Attending: Internal Medicine

## 2021-11-19 DIAGNOSIS — R262 Difficulty in walking, not elsewhere classified: Secondary | ICD-10-CM | POA: Diagnosis not present

## 2021-11-19 DIAGNOSIS — M6281 Muscle weakness (generalized): Secondary | ICD-10-CM | POA: Diagnosis not present

## 2021-11-19 NOTE — Therapy (Signed)
OUTPATIENT PHYSICAL THERAPY TREATMENT NOTE Progress Note Reporting Period 10/15/21 to 11/17/21  See note below for Objective Data and Assessment of Progress/Goals.      Patient Name: [name] MRN: [id number] DOB:08/20/1940, 82 y.o., female Today's Date: 11/19/2021  PCP: Allyn Kenner MD REFERRING PROVIDER: Debbora Presto, NP   END OF SESSION:   PT End of Session - 11/19/21 1119     Visit Number 11    Number of Visits 12    Date for PT Re-Evaluation 11/26/21    Authorization Type [employer] MCR authorization placed    Authorization Time Period 12 approved 5/11-6/22/23    Authorization - Visit Number 11    Authorization - Number of Visits 12    Progress Note Due on Visit 10    PT Start Time 1117    PT Stop Time 1157    PT Time Calculation (min) 40 min    Equipment Utilized During Treatment Gait belt    Activity Tolerance Patient tolerated treatment well    Behavior During Therapy WFL for tasks assessed/performed              Past Medical History:  Diagnosis Date   Aortic atherosclerosis ([place]) 09/11/2016   noted on CXR   Arthritis    Baker's cyst, left 2014   Small, Left   Bilateral leg numbness    Cataract    Bilateral   Diabetes mellitus without complication ([place])    Diverticulosis 10/09/2016   Noted on CT abd/pelvis   Gout    Grade I diastolic dysfunction 12/05/1599   Noted on ECHO   Hepatic cyst 10/09/2016   Noted on CT abd/pelvis   History of iron deficiency anemia    History of migraine    Hypercholesteremia    Hypertension    Internal hemorrhoids    Lateral meniscal tear 2014   Left   LVH (left ventricular hypertrophy) 03/03/2018   noted on EKG   Obesity    PMB (postmenopausal bleeding)    Pneumonia 2018   Right upper lower   PONV (postoperative nausea and vomiting)    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   Right 1.3 cm, Noted on CT Chest, pt unaware   Renal cyst 10/09/2016   Noted on CT abd/pelvis   Thoracic aortic aneurysm ([place])  12/09/2016   ascending 4.0 cm, noted on CT chest, pt unaware   Past Surgical History:  Procedure Laterality Date   ABDOMINAL HYSTERECTOMY     CATARACT EXTRACTION Right 09/2010   CATARACT EXTRACTION Left 10/2010   COLONOSCOPY     DILATION AND CURETTAGE OF UTERUS     TOTAL KNEE ARTHROPLASTY Right 04/21/2018   Procedure: TOTAL KNEE ARTHROPLASTY;  Surgeon: Earlie Server, MD;  Location: [facility];  Service: Orthopedics;  Laterality: Right;  Adductor Block   Patient Active Problem List   Diagnosis Date Noted   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   Acute encephalopathy 04/22/2018   Bradypnea    Primary localized osteoarthritis of right knee 02/10/2018   Acute viral syndrome 10/06/2016   Weakness 10/06/2016   Hypercholesteremia 10/06/2016   Gout 10/06/2016   Diabetes mellitus without complication ([place]) 09/32/3557   Viral syndrome 10/06/2016   Precordial chest pain 09/11/2016   Diabetes ([place]) 05/03/2016   HYPOKALEMIA 11/01/2008   HYPERGLYCEMIA, FASTING 11/01/2008   Type 2 diabetes mellitus with hyperlipidemia ([place]) 10/17/2008   GOUT, UNSPECIFIED 10/17/2008   OBESITY 10/17/2008   MIGRAINE HEADACHE 10/17/2008   Essential hypertension 10/17/2008   INTERNAL HEMORRHOIDS 10/17/2008  DIVERTICULOSIS, COLON 10/17/2008   ARTHRITIS 10/17/2008    REFERRING DIAG: G20 (ICD-10-CM) - Parkinson's disease ([place]) R29.898 (ICD-10-CM) - Weakness of both lower extremities   THERAPY DIAG:  Muscle weakness (generalized)  Difficulty in walking, not elsewhere classified  PERTINENT HISTORY:  Rt knee surgery, Parkinsons   PRECAUTIONS: Fall  SUBJECTIVE: Pt states she continued to have left knee pain "doctor says it needs a knee replacement but I never had it done" "it hadn't been bothering me just my legs had been weak" "I know I have arthritis". She reports that this knee pain has increased with the time in therapy but glad her strength is better.  Since last session all seems the same.     PAIN:  Are you  having pain? No and Yes: NPRS scale: 5/10 Pain location: Left knee Pain description: pain pressure Aggravating factors: walking Relieving factors: rest    OBJECTIVE:    Italics from evaluation on 10/15/21  COGNITION: Overall cognitive status: Within functional limits for tasks assessed             SENSATION: Not tested   MMT:     MMT Right 10/15/2021 Right 11/17/21 Left 10/15/2021 Left 11/17/21  Hip flexion 3+/5  4+/5 3+/5 4/5  Hip extension 2-/5 2-/5 2/5   2/5  Hip abduction 2/5  2/5 2+/5 2+/5  Hip adduction        Hip internal rotation        Hip external rotation        Knee flexion 3/5 4-/5 3/5 4-/5  Knee extension 4/5 5/5 4/5 5/5  Ankle dorsiflexion 4+/5 4+/5 4/5 4+/5  Ankle plantarflexion        Ankle inversion        Ankle eversion        (Blank rows = not tested)   Observation:  Pt is extremely fearful of falling when walking.    GAIT: Gait pattern: decreased step length- Right, decreased step length- Left, decreased hip/knee flexion- Right, decreased hip/knee flexion- Left, decreased ankle dorsiflexion- Right, and decreased ankle dorsiflexion- Left Distance walked: 100 feet Assistive device utilized: SPC Level of assistance: Min assist     FUNCTIONAL TESTs:  30 seconds chair stand test: 4X without UE from Edge of mat  ( at evaluation; unable to stand without UE assist.  With UE 3 in 30 seconds.)  2 minute walk test: 6/13:50 feet with SPC  (at eval: 4 wheeled walker pt walks for 154 ft).  Single leg test :  unable         TODAY'S TREATMENT:              11/19/21 =    There-Act Endurance warm up with cue for power pressure with discussion on overall progress, pain, and possible next steps    - NuStep level 2 seat 7 x 8 mins    There-Ex =    Standing = with gait belt donned    - Sit to stand cue for max lift eyes up, glut squeeze x 10 reps    - Sit to stand cue for push big x 5 reps    - Side step to large reach B x 10 reps    - Large step forward with  opp arm reach in // bars, alternating down bars x 4 steps repeat x 4 round trips    -    Sitting =     - Reach large with hip abd step to same side with  R UE and R LE x 10     - Reach large with hip abd step to same side with L UE and L LE x 10    - Reach across with opp hip abd step for trunk rotation with R UE and L LE x 10      - Reach across with opp hip abd step for trunk rotation with L UE and R LE x 10   11/17/21  Gait training with SPC  Progress note testing measures   11/12/21              Standing:              Side step with large arms x 2 reps               Monster walk large arms large legs x 2 Reps across the room               Square walk x 3              Supine:  bridge hold x 5 x 15                            Dead bug with big motion x 10              Prone power up, power rock and reach x 5               Sidelying twist right with arm reaching up to ceiling; slap arm back to mat repeat to left.           11/10/21:  Log roll bed mobility  Supine: bridge 2X10   Dead bug with big movements 2X10  Prone:  power up posture;prone to POE 10X   Power "rock and reach" 5X each UE  Seated: sit to stand 10X with UE standing with UE flexion   Side step with UE's out with step, in with together and clap 2RT in front of large mat   11/05/2021:          Bed mobility sit to side lying; side lying to sit x 10          Supine:    bridge x 10           POWER:  prone to POE, to POE with reaching with Rt the LT x 10                           Sidelying twist right with arm reaching up to ceiling; slap arm back to mat repeat to left.                           Sit to stand x 10                           Stand to squat to power up position.                             Side step with arms out with side step out, clapping at shoulder height with legs together x 2 Rt   11/04/21 Standing: marching x 10 with bilat ue support // bars Heel raises in // bars 2 x 10 Hip abduction 15X Hip  extension  15X Sidestepping  with minimal UE assist // bars down and back 2 x 4" step toe taps 2 x 10 each with alternating UE movements  tandem stance 2x 30" each LE lead on balance beam no UE assist Sit to stand push up with arms and then raise arms overhead x 5; CGA    10/29/21: There-Act = Nustep lv 1 (seat 8) 8 min cont for strength and sequencing  = Sit to stand push up with arms and then raise arms overhead x 5 cue big; SBA  = Sit to stand trial with no arms, unable, then cued for minimizing use of B UE x 2, then retried no arms and able to x 1 with cue for moving big; CGA There-Ex = in // bars all with SBA Standing marching x 20 with B UE support  Heel raises 2 x 10 with B UE support Hip abduction 1 x 10 with B UE support Neuro-Re-ed = in // bars  Tandem stance x 30" each no UE assist SLS trial with B UE support eyes open, cued for minimal x 10 sec B  10/27/21 Nustep lv 1 (seat 8) 5 min for strength and sequencing)  Seated marching x 10  Standing Standing marching x 10 with bilat ue support // bars Heel raises in // bars 2 x 10 Sidestepping using hands for balance // bars down and back 2 x 4" step toe taps 2 x 10 each with UE assist Stagger stance x 30" each no UE assist Sit to stand push up with arms and then raise arms overhead x 5; CGA   10/22/21 Nustep lv 1 (seat 8) 5 min for strength and sequencing)  Seated march 2 x10 Seated UE/ LE raise 2 x10  Seated palloff pressRTB 2 x 10 each  Heel raise 2 x 10  Sit to stand 2 x10  with min guard and verbal cues   10/19/21 Bridge 10 x 5"  Supine march x10  Supine hip abduction/ adduction isometric 10 x 5" Max A with supine to sit transfer  Sit to stand x10   Eval: Sit to stand x 10 Supine: Bridge x 10               Marching x 10      PATIENT EDUCATION: Education details: eval: on therapy goals, HEP, exercise form and function 10/29/21: big movements, increased balance challenges for fall prevention, goal discussion 11/19/21:  discussion on continued therapy, cont big movements with some WB and some NWB Person educated: Patient and Spouse Education method: Explanation, Demonstration, Verbal cues, and Handouts Education comprehension: verbalized understanding and returned demonstration     HOME EXERCISE PROGRAM:  10/22/21:  heel raise   10/19/21: Supine hip abduction/ adduction isometrics  Eval: Sit to stand x 10, Supine Bridge x 10, Marching x 10     GOALS: Goals reviewed with patient? YES on 10/29/21   SHORT TERM GOALS: Target date: 11/05/2021      Pt to be I in a HEP to be able to come sit to stand without UE assist Baseline: Goal status: PARTLY MET (has to get going first/confidence)   2.  PT to be able to single leg stance B for 5 seconds to reduce risk of falling  Baseline:  Goal status: ONGOING   3.  PT to be I in bed mobility  Baseline:  Goal status: PARTLY MET (spouse states he has to assist sometimes, but mostly uses remote on bed)  LONG TERM GOALS: Target date: 11/26/2021            1.    Pt to be I in advanced HEP to be able to walk 226 ft in a two minute time period for improve ambulation.     Baseline:  Goal status: ONGOING   2.  Pt to be able to single leg stance for 10 seconds each to decrease risk of falling  Baseline:  Goal status: ONGOING   3.  PT LE strength to increase by one grade to allow pt to ascend and descend 3 steps with one hand hold  Baseline:  Goal status: PARTLY MET (has met for hamstring,quad and hip flexion only)   4.  PT to be able to complete 7 sit to stand in a 30 second period of time. Baseline:  Goal status: ONGOING (completed 4 today)   ASSESSMENT:   CLINICAL IMPRESSION:  Today's session continued with overall movements cued for large amplitude and large strength actions.  Madhuri states irritation into joints with increased work however denied any irritation today with combination of sitting activities with standing work.  Nyaira demonstrated struggle  with overall coordination secondary to her parkinsons, however able to improve with actions such as sit to stand when cued for large amplitude and gluteal activity.  Discussion for continued therapy after next session as final in current authorization and patient to report decision. It is recommended that patient continue as she will continue to benefit from skilled therapy for balance and large motion as well as POWER based exercises to improve safety of gait.     OBJECTIVE IMPAIRMENTS Abnormal gait, decreased activity tolerance, decreased balance, decreased mobility, difficulty walking, and decreased strength.    ACTIVITY LIMITATIONS cleaning, community activity, and shopping.    PERSONAL FACTORS Age and 1-2 comorbidities: Parkinson disease   are also affecting patient's functional outcome.      REHAB POTENTIAL: Good   CLINICAL DECISION MAKING: Stable/uncomplicated   EVALUATION COMPLEXITY: Moderate   PLAN: PT FREQUENCY: 2x/week   PT DURATION: 6 weeks   PLANNED INTERVENTIONS: Therapeutic exercises, Therapeutic activity, Neuromuscular re-education, Balance training, Gait training, and Patient/Family education   PLAN FOR NEXT SESSION: PWR, bed mobility and strengthening exercises; balance.  Assess for continued verse discharge next session.     12:08 PM, 11/19/21  Margarette Asal. Carlis Abbott PT, DPT  Contract Physical Therapist at  [facility] [phone]

## 2021-11-23 ENCOUNTER — Telehealth (HOSPITAL_COMMUNITY): Payer: Self-pay | Admitting: Physical Therapy

## 2021-11-23 ENCOUNTER — Encounter (HOSPITAL_COMMUNITY): Payer: Self-pay | Admitting: Physical Therapy

## 2021-11-23 NOTE — Therapy (Unsigned)
[facility] [address] [place], [place], [address] Phone: [phone]   Fax:  [phone]  Patient Details  Name: [name] MRN: [id number] Date of Birth: 1940/08/19 Referring Provider:  No ref. provider found  Encounter Date: 11/23/2021  PHYSICAL THERAPY DISCHARGE SUMMARY  Visits from Start of Care: 11  Current functional level related to goals / functional outcomes: PT treatment limited by knee pain   Remaining deficits: Knee pain    Education / Equipment: HEP   Patient agrees to discharge. Patient goals were partially met. Patient is being discharged due to a change in medical status.   Rayetta Humphrey, PT CLT [phone]  11/23/2021, 12:18 PM  [facility] [address] [place], [place], [address] Phone: [phone]   Fax:  [phone]

## 2021-11-23 NOTE — Telephone Encounter (Signed)
She wants to be d/c - the PT has aggravated her knee and she will call the MD about a TKR.

## 2021-11-24 ENCOUNTER — Ambulatory Visit (HOSPITAL_COMMUNITY): Payer: Medicare HMO

## 2021-11-26 ENCOUNTER — Encounter (HOSPITAL_COMMUNITY): Payer: Medicare HMO | Admitting: Physical Therapy

## 2022-01-19 ENCOUNTER — Ambulatory Visit: Payer: Medicare HMO | Admitting: Neurology

## 2022-01-19 ENCOUNTER — Encounter: Payer: Self-pay | Admitting: Neurology

## 2022-01-19 VITALS — BP 130/86 | HR 74 | Ht 62.0 in | Wt 158.0 lb

## 2022-01-19 DIAGNOSIS — G20 Parkinson's disease: Secondary | ICD-10-CM

## 2022-01-19 MED ORDER — CARBIDOPA-LEVODOPA 25-100 MG PO TABS: ORAL_TABLET | ORAL | 3 refills | Status: DC

## 2022-01-19 NOTE — Progress Notes (Signed)
Subjective:    Patient ID: [name] is a 81 y.o. female.  HPI    Interim history:   [name] is an 81 year old right-handed woman with an underlying complex medical history of hypertension, hyperlipidemia, diastolic dysfunction, chronic kidney disease, diverticulosis, diabetes, leg numbness, renal artery aneurysm, obesity, left ventricular hypertrophy, gout, arthritis, history of pneumonia, and history of migraines, who presents for FU consultation of [name] parkinsonism. The patient is accompanied by [name] husband today. I last saw [name] on 06/25/2021, 06/25/2021, at which time [name] felt fairly stable, [name] had stopped taking gabapentin due to lack of benefit.  [name] was advised to continue with generic Sinemet 1 pill 4 times a day.  [name] had ongoing issues with bladder control.  [name] saw Melanie Presto, NP in the interim on 09/17/2021, at which time [name] complained of feeling weaker overall.  [name] had elevated blood pressure readings.  [name] was advised to follow-up with [name] primary care regarding blood pressure management.  [name] was referred to outpatient rehab.  Today, 01/19/2022: [name] reports feeling overall stable.  [name] still has left-sided knee pain, [name] right knee was replaced and is doing overall quite well.  [name] has not had any recent falls, [name] uses a rolling walker, also inside the home.  [name] denies any significant constipation, [name] has had intermittent drooling during the day, not so much at night.  [name] feels that [name] medication working well enough on a 4 hourly basis.  [name] had physical therapy through neuro rehab at [facility].  [name] requested to stop physical therapy due to worsening knee pain.  The patient's allergies, current medications, family history, past medical history, past social history, past surgical history and problem list were reviewed and updated as appropriate.    Previously:    I saw [name] on 05/15/2020, at which time [name] reported feeling stable. [name] had not fallen.    [name] saw Melanie Presto, NP on 02/26/21, at which time [name] reported difficulty swallowing.  A swallow study was ordered.  [name] had a MBSS on 03/17/2021 with benign findings.     I saw [name] on 10/10/2019, at which time [name] reported that the Sinemet was helpful for [name] tremor.  [name] had noticed no bladder dyscontrol and urinary incontinence.  [name] had not seen urology and was encouraged to talk to [name] primary care about a referral to see urology.  [name] had residual right knee pain and also reported left knee pain.  I suggested [name] increase [name] Sinemet to 1 pill 4 times daily.    [name] called in the interim reporting in June that [name] felt [name] swelling became worse after [name] increase the Sinemet but [name] was encouraged to follow-up with [name] primary care regarding the lower extremity swelling as Sinemet is not typically the cause for lower extremity swelling.   [name] saw [name], nurse practitioner in the interim on 01/16/2020, at which time [name] was stable on Sinemet 1 pill 4 times a day.  [name] was reporting numbness in both feet.  [name] was taking gabapentin 300 mg strength as needed for [name] knee pain primarily.        I first met [name] on 07/11/2019 at the request of [name] primary care provider, at which time [name] reported a several month history of hand tremors, right side more than left and also additional motor symptoms including slowness.  [name] had on examination findings in keeping with parkinsonism with right-sided lateralization noted.  [name] history and exam were  in keeping with right-sided Parkinson's disease and [name] was advised to start Sinemet at low-dose with gradual titration.     07/11/19: ([name]) reports a hand tremor for the past several months, less than a year.  Started in [name] right hand.  [name] has noticed slowness and difficulty with fine motor skills.  [name] has had balance problems.  [name] had a right total knee replacement in October 2020 and fell in December 2020.  Thankfully, [name] did not injure [name] right knee but [name] had some pain.   [name] was advised to start gabapentin.  In the past, [name] had received injection into [name] lower back for lower back pain but has not had any recent pain in the lower back.  [name] also reports that [name] may need a replacement of [name] left knee but currently does not have any significant left knee pain.  [name] had been using a 2 wheeled walker prior to [name] fall and actually had graduated to a cane after finishing therapy.  After the fall [name] started using [name] rolling walker.  [name] denies any significant constipation, [name] is not aware of any family history of Parkinson's disease.  [name] denies any significant memory issues.  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  [name] retired in the early 90s.  [name] is a non-smoker and does not drink any alcohol.  [name] drinks caffeine and very little amounts.  [name] reports some difficulty sleeping, [name] has nocturia and also reports incontinence at night and also during the day, wears depends.  I reviewed your office note from 07/04/2019.  [name] was noted to have bilateral hand tremors.  [name] was advised to continue with the propranolol.  [name] has been on it for a few months as I understand.  [name] does not notice much in the way of benefit from it.    [name] Past Medical History Is Significant For: Past Medical History:  Diagnosis Date   Aortic atherosclerosis ([place]) 09/11/2016   noted on CXR   Arthritis    Baker's cyst, left 2014   Small, Left   Bilateral leg numbness    Cataract    Bilateral   Diabetes mellitus without complication ([place])    Diverticulosis 10/09/2016   Noted on CT abd/pelvis   Gout    Grade I diastolic dysfunction 33/29/5188   Noted on ECHO   Hepatic cyst 10/09/2016   Noted on CT abd/pelvis   History of iron deficiency anemia    History of migraine    Hypercholesteremia    Hypertension    Internal hemorrhoids    Lateral meniscal tear 2014   Left   LVH (left ventricular hypertrophy) 03/03/2018   noted on EKG   Obesity    PMB  (postmenopausal bleeding)    Pneumonia 2018   Right upper lower   PONV (postoperative nausea and vomiting)    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   Right 1.3 cm, Noted on CT Chest, pt unaware   Renal cyst 10/09/2016   Noted on CT abd/pelvis   Thoracic aortic aneurysm ([place]) 12/09/2016   ascending 4.0 cm, noted on CT chest, pt unaware    [name] Past Surgical History Is Significant For: Past Surgical History:  Procedure Laterality Date   ABDOMINAL HYSTERECTOMY     CATARACT EXTRACTION Right 09/2010   CATARACT EXTRACTION Left 10/2010   COLONOSCOPY     DILATION AND CURETTAGE OF UTERUS     TOTAL KNEE ARTHROPLASTY Right 04/21/2018   Procedure:  TOTAL KNEE ARTHROPLASTY;  Surgeon: Earlie Server, MD;  Location: [facility];  Service: Orthopedics;  Laterality: Right;  Adductor Block    [name] Family History Is Significant For: Family History  Problem Relation Age of Onset   Kidney disease Mother    Diabetes Mother    Congestive Heart Failure Mother    Cancer Father        unsure of type   Multiple sclerosis Neg Hx    Parkinson's disease Neg Hx     [name] Social History Is Significant For: Social History   Socioeconomic History   Marital status: Married    Spouse name: Not on file   Number of children: 2   Years of education: 12   Highest education level: High school graduate  Occupational History   Occupation: Retired  Tobacco Use   Smoking status: Never   Smokeless tobacco: Never  Scientific laboratory technician Use: Never used  Substance and Sexual Activity   Alcohol use: No   Drug use: No   Sexual activity: Not on file  Other Topics Concern   Not on file  Social History Narrative   Lives at home with [name] husband.   Right-handed.   1/4 cup coffee per day.   Social Determinants of Health   Financial Resource Strain: Not on file  Food Insecurity: Not on file  Transportation Needs: Not on file  Physical Activity: Not on file  Stress: Not on file  Social Connections: Not on file     [name] Allergies Are:  No Known Allergies:   [name] Current Medications Are:  Outpatient Encounter Medications as of 01/19/2022  Medication Sig   acetaminophen (TYLENOL) 500 MG tablet Take 500 mg by mouth every 6 (six) hours as needed for moderate pain.    amLODipine (NORVASC) 5 MG tablet Take 5 mg by mouth daily.    carbidopa-levodopa (SINEMET IR) 25-100 MG tablet Take 1 tablet by mouth 4 (four) times daily. At 6 AM, 10 AM, 2 PM and 6 PM   chlorthalidone (HYGROTON) 25 MG tablet Take 12.5 mg by mouth daily.   metFORMIN (GLUCOPHAGE) 500 MG tablet Take 500 mg by mouth daily with breakfast.   naproxen (NAPROSYN) 500 MG tablet Take 500 mg by mouth as needed.   olmesartan (BENICAR) 20 MG tablet Take 20 mg by mouth daily.   simvastatin (ZOCOR) 40 MG tablet Take 40 mg by mouth every evening.   No facility-administered encounter medications on file as of 01/19/2022.  :  Review of Systems:  Out of a complete 14 point review of systems, all are reviewed and negative with the exception of these symptoms as listed below:   Review of Systems  Neurological:        Pt here for parkinson's f/u  pt states [name] is drooling . Pt states that [name] toes get numb Pt states [name] was taking PT for 5 weeks . Pt states exercising  was making [name] legs hurt and didn't return     Objective:  Neurological Exam  Physical Exam Physical Examination:   Vitals:   01/19/22 0943  BP: 130/86  Pulse: 74    General Examination: The patient is a very pleasant 81 y.o. female in no acute distress. [name] appears well-developed and well-nourished and well groomed.   HEENT: Normocephalic, atraumatic, pupils are equal, round and reactive to light. Extraocular tracking is mildly impaired, [name] has mild to moderate facial masking, [name] has an intermittent lower jaw and lip tremor.  Corrective  eyeglasses in place.  [name] has moderate nuchal rigidity.  [name] has intact hearing.  Airway examination reveals moderate mouth dryness, otherwise  nonfocal findings, tongue protrudes centrally and palate elevates symmetrically.  Mild hypophonia noted, no voice tremor, no dysarthria.  No sialorrhea.   Chest: Clear to auscultation without wheezing, rhonchi or crackles noted.   Wound check heart: S1+S2+0, regular and normal without murmurs, rubs or gallops noted.    Abdomen: Soft, non-tender and non-distended.   Extremities: There is b/l 1+ pitting edema in the ankles, L more than R.    Skin: Warm and dry without trophic changes noted.   Musculoskeletal: exam reveals left knee pain and left knee swelling, mild valgus deformity.     Neurologically:   Mental status: The patient is awake, alert and oriented in all 4 spheres. [name] immediate and remote memory, attention, language skills and fund of knowledge are fairly appropriate, [name] is somewhat slow in [name] thinking and in [name] responses.  [name] does have hypophonia, no obvious dysarthria.  Mood and affect are normal. Cranial nerves II - XII are as described above under HEENT exam.  Motor exam: Normal bulk, global strength of 4+ out of 5, [name] has a mild to moderate resting tremor in the right upper extremity, mild in the left upper extremity, no lower extremity resting tremor.  [name] has a mild postural tremor, minimal action tremor.  Romberg is not tested due to safety concerns.  [name] has moderate difficulty with fine motor skills on the right upper and lower extremities, slightly better on the left.  [name] stands up with difficulty, takes 3 attempts but requires no assistance.  [name] pushes herself up, [name] uses a rolling walker with seat.  Posture is moderately stooped, balance is mildly impaired.   Assessment and Plan:    In summary, GENEVRA ORNE is a very pleasant 81 year old female with an underlying complex medical history of hypertension, hyperlipidemia, diastolic dysfunction, chronic kidney disease, diverticulosis, diabetes, leg numbness, renal artery aneurysm, obesity, left ventricular  hypertrophy, gout, arthritis, status post right knee surgery, history of pneumonia, and history of migraines, who presents for follow-up consultation of [name] parkinsonism with history and exam in keeping with right-sided predominant Parkinson's disease. [name] has had response to levodopa therapy, has been able to tolerate Sinemet, which we increased from 1 pill 3 times daily to 1 pill 4 times daily in May 2021.  [name] does not currently have any significant problems with constipation and no recent falls.  We talked about the importance of fall prevention.  [name] is advised to continue to use [name] walker at all times and stay well-hydrated and well rested. [name] is advised to continue with Sinemet 1 pill 4 times daily. [name] is encouraged to follow-up routinely to see Melanie Presto, NP, in 6 months, sooner if needed.  I answered all their questions today and the patient and [name] husband were in agreement. I spent 30 minutes in total face-to-face time and in reviewing records during pre-charting, more than 50% of which was spent in counseling and coordination of care, reviewing test results, reviewing medications and treatment regimen and/or in discussing or reviewing the diagnosis of PD, the prognosis and treatment options. Pertinent laboratory and imaging test results that were available during this visit with the patient were reviewed by me and considered in my medical decision making (see chart for details).

## 2022-01-19 NOTE — Patient Instructions (Signed)
It was nice to see you both again today.  We will keep your carbidopa-levodopa at 1 pill 4 times a day, 4 hourly starting at 6 AM. Please continue to use your walker at all times and stay well-hydrated.  Follow-up to see Amy in about 6 months.

## 2022-01-26 DIAGNOSIS — Z0100 Encounter for examination of eyes and vision without abnormal findings: Secondary | ICD-10-CM | POA: Diagnosis not present

## 2022-01-26 DIAGNOSIS — H5213 Myopia, bilateral: Secondary | ICD-10-CM | POA: Diagnosis not present

## 2022-02-19 DIAGNOSIS — E1122 Type 2 diabetes mellitus with diabetic chronic kidney disease: Secondary | ICD-10-CM | POA: Diagnosis not present

## 2022-02-19 DIAGNOSIS — R946 Abnormal results of thyroid function studies: Secondary | ICD-10-CM | POA: Diagnosis not present

## 2022-02-19 DIAGNOSIS — E782 Mixed hyperlipidemia: Secondary | ICD-10-CM | POA: Diagnosis not present

## 2022-02-24 DIAGNOSIS — E782 Mixed hyperlipidemia: Secondary | ICD-10-CM | POA: Diagnosis not present

## 2022-02-24 DIAGNOSIS — D509 Iron deficiency anemia, unspecified: Secondary | ICD-10-CM | POA: Diagnosis not present

## 2022-02-24 DIAGNOSIS — G20 Parkinson's disease: Secondary | ICD-10-CM | POA: Diagnosis not present

## 2022-02-24 DIAGNOSIS — Z0000 Encounter for general adult medical examination without abnormal findings: Secondary | ICD-10-CM | POA: Diagnosis not present

## 2022-02-24 DIAGNOSIS — M1711 Unilateral primary osteoarthritis, right knee: Secondary | ICD-10-CM | POA: Diagnosis not present

## 2022-02-24 DIAGNOSIS — M109 Gout, unspecified: Secondary | ICD-10-CM | POA: Diagnosis not present

## 2022-02-24 DIAGNOSIS — Z23 Encounter for immunization: Secondary | ICD-10-CM | POA: Diagnosis not present

## 2022-02-24 DIAGNOSIS — I129 Hypertensive chronic kidney disease with stage 1 through stage 4 chronic kidney disease, or unspecified chronic kidney disease: Secondary | ICD-10-CM | POA: Diagnosis not present

## 2022-02-24 DIAGNOSIS — E1122 Type 2 diabetes mellitus with diabetic chronic kidney disease: Secondary | ICD-10-CM | POA: Diagnosis not present

## 2022-03-31 ENCOUNTER — Ambulatory Visit: Payer: Medicare HMO | Admitting: Podiatrist

## 2022-03-31 DIAGNOSIS — G5793 Unspecified mononeuropathy of bilateral lower limbs: Secondary | ICD-10-CM | POA: Diagnosis not present

## 2022-03-31 NOTE — Patient Instructions (Signed)
I have ordered a medication for you that will come from [facility] in [place]. They should be calling you to verify insurance and will mail the medication to you. If you live close by then you can go by their pharmacy to pick up the medication. Their phone number is [phone]. If you do not hear from them in the next few days, please give us a call at [phone].   

## 2022-03-31 NOTE — Progress Notes (Signed)
Chief Complaint  Patient presents with   Numbness    Bilateral numbness in feet . Patient states she is a diabetic , patient states she also has parkinson's diseases. No swelling / no redness. Patient states numbness in her feet is constant.      HPI: Patient is 81 y.o. female who presents today for numbness in base of the toes.  Uses spacers in between toes 1 and 2 and relates this keeps these toes somewhat comfortable.  She relates she also has Parkinson's disease and wonders if this may contribute to her foot numbness.  She relates the numbness in her feet is constant.  She relates she used to take Gabapentin, but didn't notice much difference so she stopped.     Patient Active Problem List   Diagnosis Date Noted   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   Acute encephalopathy 04/22/2018   Bradypnea    Primary localized osteoarthritis of right knee 02/10/2018   Acute viral syndrome 10/06/2016   Weakness 10/06/2016   Hypercholesteremia 10/06/2016   Gout 10/06/2016   Diabetes mellitus without complication ([place]) 80/99/8338   Viral syndrome 10/06/2016   Precordial chest pain 09/11/2016   Diabetes ([place]) 05/03/2016   HYPOKALEMIA 11/01/2008   HYPERGLYCEMIA, FASTING 11/01/2008   Type 2 diabetes mellitus with hyperlipidemia ([place]) 10/17/2008   GOUT, UNSPECIFIED 10/17/2008   OBESITY 10/17/2008   MIGRAINE HEADACHE 10/17/2008   Essential hypertension 10/17/2008   INTERNAL HEMORRHOIDS 10/17/2008   DIVERTICULOSIS, COLON 10/17/2008   ARTHRITIS 10/17/2008    Current Outpatient Medications on File Prior to Visit  Medication Sig Dispense Refill   acetaminophen (TYLENOL) 500 MG tablet Take 500 mg by mouth every 6 (six) hours as needed for moderate pain.      amLODipine (NORVASC) 5 MG tablet Take 5 mg by mouth daily.      carbidopa-levodopa (SINEMET IR) 25-100 MG tablet Take 1 tablet by mouth 4 (four) times daily. At 6 AM, 10 AM, 2 PM and 6 PM 360 tablet 3   chlorthalidone (HYGROTON) 25 MG tablet Take 12.5  mg by mouth daily.     metFORMIN (GLUCOPHAGE) 500 MG tablet Take 500 mg by mouth daily with breakfast.     naproxen (NAPROSYN) 500 MG tablet Take 500 mg by mouth as needed.     olmesartan (BENICAR) 20 MG tablet Take 20 mg by mouth daily.     simvastatin (ZOCOR) 40 MG tablet Take 40 mg by mouth every evening.     No current facility-administered medications on file prior to visit.    No Known Allergies  Review of Systems No fevers, chills, nausea, muscle aches, no difficulty breathing, no calf pain, no chest pain or shortness of breath.   Physical Exam  GENERAL APPEARANCE: Alert, conversant. Appropriately groomed. No acute distress.   VASCULAR: Pedal pulses palpable 1/4 DP and 1/4 PT bilateral.  Capillary refill time is immediate to all digits,  Proximal to distal cooling is warm to warm.  Digital perfusion adequate.   NEUROLOGIC: sensation is absent to 5.07 monofilament at 0/5 sites Left and 1/5 sites right.  Light touch is decreased. bilateral, vibratory sensation absent bilateral.  Decrease sensation to cool/ warm bilateral.   MUSCULOSKELETAL: acceptable muscle strength, tone and stability bilateral. Decreased range of motion at first mpj bilateral is noted.   No pain, crepitus or limitation noted with foot and ankle range of motion bilateral.   DERMATOLOGIC: skin is warm, supple, and dry.  Color, texture, and turgor of skin within normal limits.  No open wounds are noted.  No preulcerative lesions are seen.  Digital nails are asymptomatic.      Assessment     ICD-10-CM   1. Neuropathy of both feet  G57.93        Plan  Discussed Neuropathy and numbness that she is experiencing. Discussed that treatments wont bring back the sensation but can help with pain.  Discussed gabapentin and topical pain medication-  will re start her on Gabapentin to see how she does and may adjust based on her response.  Will also try a cream from [facility] to be called in as well.  Will  recheck in 6 weeks.

## 2022-04-05 ENCOUNTER — Telehealth: Payer: Self-pay | Admitting: Podiatrist

## 2022-04-05 MED ORDER — GABAPENTIN 300 MG PO CAPS: 300.0000 mg | ORAL_CAPSULE | Freq: Three times a day (TID) | ORAL | 1 refills | Status: DC

## 2022-04-05 NOTE — Telephone Encounter (Signed)
Pt called asking about her two Rx that was suppose to be called in pt still hasnt received them.   Please advise

## 2022-04-05 NOTE — Telephone Encounter (Signed)
Spoke to the patient this morning advising her that per Dr. Shaune Pollack notes the medication was sent to [facility] and to contact them to see if they received the prescription. Patient was given contact information. Patient also stated that she uses the pharmacy below as her regular pharmacy. I advised the patient that if its a certain cream that was prescribed that was a compound cream then it has to be sent to certain pharmacies because other pharmacies don't do the compound medications or creams. Patient understood and was going to calls [place] back if [facility] didn't have the medication.  [place], [place] - [address] , [place] [address]

## 2022-04-06 ENCOUNTER — Telehealth: Payer: Self-pay | Admitting: *Deleted

## 2022-04-06 MED ORDER — NONFORMULARY OR COMPOUNDED ITEM: 1 refills | Status: DC

## 2022-04-06 NOTE — Telephone Encounter (Signed)
Faxed prescription for pain cream to [facility] received 04/06/22.

## 2022-04-06 NOTE — Telephone Encounter (Signed)
Noted, thanks!

## 2022-05-13 ENCOUNTER — Ambulatory Visit: Payer: Medicare HMO | Admitting: Podiatry

## 2022-05-13 DIAGNOSIS — M79675 Pain in left toe(s): Secondary | ICD-10-CM | POA: Diagnosis not present

## 2022-05-13 DIAGNOSIS — E1169 Type 2 diabetes mellitus with other specified complication: Secondary | ICD-10-CM

## 2022-05-13 DIAGNOSIS — L84 Corns and callosities: Secondary | ICD-10-CM

## 2022-05-13 DIAGNOSIS — E785 Hyperlipidemia, unspecified: Secondary | ICD-10-CM | POA: Diagnosis not present

## 2022-05-13 DIAGNOSIS — M79674 Pain in right toe(s): Secondary | ICD-10-CM | POA: Diagnosis not present

## 2022-05-13 DIAGNOSIS — M2041 Other hammer toe(s) (acquired), right foot: Secondary | ICD-10-CM

## 2022-05-13 DIAGNOSIS — B351 Tinea unguium: Secondary | ICD-10-CM

## 2022-05-16 NOTE — Progress Notes (Signed)
  Subjective:  Patient ID: [name], female    DOB: [birth year],  MRN: [id number]  Chief Complaint  Patient presents with   Peripheral Neuropathy    4 week follow up, both feet   Nail Problem    Thick painful toenails   Hammer Toe    Right third toe, painful callus    81 y.o. female presents with the above complaint. History confirmed with patient.  She presents with multiple issues, the worst of which for her is a painful callus on the tip of the third toe on the right foot.  The nails are thick and elongated causing discomfort.  She has diabetes and has numbness and tingling.  Objective:  Physical Exam: warm, good capillary refill, no trophic changes or ulcerative lesions, normal DP and PT pulses, and abnormal sensory exam with diffuse neuropathy in the tips of the toes. Left Foot: dystrophic yellowed discolored nail plates with subungual debris and pinch callus hallux Right Foot: dystrophic yellowed discolored nail plates with subungual debris and callus to both third toe with contracted hammertoe, submetatarsal 1    Assessment:   1. Pain due to onychomycosis of toenails of both feet   2. Callus of foot   3. Type 2 diabetes mellitus with hyperlipidemia (HCC)   4. Hammertoe of right foot      Plan:  Patient was evaluated and treated and all questions answered.  Patient educated on diabetes. Discussed proper diabetic foot care and discussed risks and complications of disease. Educated patient in depth on reasons to return to the office immediately should he/she discover anything concerning or new on the feet. All questions answered. Discussed proper shoes as well.   Discussed the etiology and treatment options for the condition in detail with the patient. Educated patient on the topical and oral treatment options for mycotic nails. Recommended debridement of the nails today. Sharp and mechanical debridement performed of all painful and mycotic nails today. Nails debrided in  length and thickness using a nail nipper to level of comfort. Discussed treatment options including appropriate shoe gear. Follow up as needed for painful nails.  All symptomatic hyperkeratoses were safely debrided with a sterile #15 blade to patient's level of comfort without incident. We discussed preventative and palliative care of these lesions including supportive and accommodative shoegear, padding, prefabricated and custom molded accommodative orthoses, use of a pumice stone and lotions/creams daily.  Discussed treatment of hyperkeratotic lesions in the painful hammertoe deformity.  Could consider percutaneous tenotomy if not improving.  Return in about 3 months (around 08/12/2022), or if symptoms worsen or fail to improve, for at risk diabetic foot care.

## 2022-05-20 DIAGNOSIS — R195 Other fecal abnormalities: Secondary | ICD-10-CM | POA: Diagnosis not present

## 2022-05-20 DIAGNOSIS — E6609 Other obesity due to excess calories: Secondary | ICD-10-CM | POA: Diagnosis not present

## 2022-05-20 DIAGNOSIS — I129 Hypertensive chronic kidney disease with stage 1 through stage 4 chronic kidney disease, or unspecified chronic kidney disease: Secondary | ICD-10-CM | POA: Diagnosis not present

## 2022-06-22 DIAGNOSIS — E1122 Type 2 diabetes mellitus with diabetic chronic kidney disease: Secondary | ICD-10-CM | POA: Diagnosis not present

## 2022-06-22 DIAGNOSIS — R946 Abnormal results of thyroid function studies: Secondary | ICD-10-CM | POA: Diagnosis not present

## 2022-06-22 DIAGNOSIS — E782 Mixed hyperlipidemia: Secondary | ICD-10-CM | POA: Diagnosis not present

## 2022-06-28 DIAGNOSIS — M109 Gout, unspecified: Secondary | ICD-10-CM | POA: Diagnosis not present

## 2022-06-28 DIAGNOSIS — F0280 Dementia in other diseases classified elsewhere without behavioral disturbance: Secondary | ICD-10-CM | POA: Diagnosis not present

## 2022-06-28 DIAGNOSIS — I129 Hypertensive chronic kidney disease with stage 1 through stage 4 chronic kidney disease, or unspecified chronic kidney disease: Secondary | ICD-10-CM | POA: Diagnosis not present

## 2022-06-28 DIAGNOSIS — R946 Abnormal results of thyroid function studies: Secondary | ICD-10-CM | POA: Diagnosis not present

## 2022-06-28 DIAGNOSIS — E782 Mixed hyperlipidemia: Secondary | ICD-10-CM | POA: Diagnosis not present

## 2022-06-28 DIAGNOSIS — G20A1 Parkinson's disease without dyskinesia, without mention of fluctuations: Secondary | ICD-10-CM | POA: Diagnosis not present

## 2022-06-28 DIAGNOSIS — N1831 Chronic kidney disease, stage 3a: Secondary | ICD-10-CM | POA: Diagnosis not present

## 2022-06-28 DIAGNOSIS — E1122 Type 2 diabetes mellitus with diabetic chronic kidney disease: Secondary | ICD-10-CM | POA: Diagnosis not present

## 2022-06-28 DIAGNOSIS — D509 Iron deficiency anemia, unspecified: Secondary | ICD-10-CM | POA: Diagnosis not present

## 2022-06-28 DIAGNOSIS — M1711 Unilateral primary osteoarthritis, right knee: Secondary | ICD-10-CM | POA: Diagnosis not present

## 2022-07-15 ENCOUNTER — Ambulatory Visit: Payer: Medicare HMO | Admitting: Podiatry

## 2022-07-15 ENCOUNTER — Encounter: Payer: Self-pay | Admitting: Podiatry

## 2022-07-15 VITALS — BP 188/93 | HR 79

## 2022-07-15 DIAGNOSIS — E785 Hyperlipidemia, unspecified: Secondary | ICD-10-CM

## 2022-07-15 DIAGNOSIS — E1169 Type 2 diabetes mellitus with other specified complication: Secondary | ICD-10-CM | POA: Diagnosis not present

## 2022-07-15 DIAGNOSIS — B351 Tinea unguium: Secondary | ICD-10-CM

## 2022-07-15 DIAGNOSIS — M79675 Pain in left toe(s): Secondary | ICD-10-CM

## 2022-07-15 DIAGNOSIS — M79674 Pain in right toe(s): Secondary | ICD-10-CM

## 2022-07-15 NOTE — Progress Notes (Signed)
This patient returns to my office for at risk foot care.  This patient requires this care by a professional since this patient will be at risk due to having diabetes.  This patient is unable to cut nails herself since the patient cannot reach her nails.These nails are painful walking and wearing shoes.  This patient presents for at risk foot care today.  General Appearance  Alert, conversant and in no acute stress.  Vascular  Dorsalis pedis and posterior tibial  pulses are palpable  bilaterally.  Capillary return is within normal limits  bilaterally. Temperature is within normal limits  bilaterally.  Neurologic  Senn-Weinstein monofilament wire test within normal limits  bilaterally. Muscle power within normal limits bilaterally.  Nails Thick disfigured discolored nails with subungual debris  from hallux to fifth toes bilaterally. No evidence of bacterial infection or drainage bilaterally.  Orthopedic  No limitations of motion  feet .  No crepitus or effusions noted.  Hammer toes  B/L.  Skin  normotropic skin with no porokeratosis noted bilaterally.  No signs of infections or ulcers noted.  Asymptomatic clavi 3rd toe right foot from salicylic acid usage.   Onychomycosis  Pain in right toes  Pain in left toes  Consent was obtained for treatment procedures.   Mechanical debridement of nails 1-5  bilaterally performed with a nail nipper.  Filed with dremel without incident. Told her to discontinue acid usage.   Return office visit   3 months                   Told patient to return for periodic foot care and evaluation due to potential at risk complications.   Gardiner Barefoot DPM

## 2022-08-03 NOTE — Patient Instructions (Incomplete)
Below is our plan:  We will continue carbidopa levodopa 1 tablet four times daily. Let me know if you wish to return to PT.   Please make sure you are staying well hydrated. I recommend 50-60 ounces daily. Well balanced diet and regular exercise encouraged. Consistent sleep schedule with 6-8 hours recommended.   Please continue follow up with care team as directed.   Follow up with me in 6 months   You may receive a survey regarding today's visit. I encourage you to leave honest feed back as I do use this information to improve patient care. Thank you for seeing me today!

## 2022-08-03 NOTE — Progress Notes (Unsigned)
PATIENT: [name] DOB: Apr 02, 1941  REASON FOR VISIT: follow up HISTORY FROM: patient  No chief complaint on file.    HISTORY OF PRESENT ILLNESS:  08/03/22 [name] returns for follow up for Parkinson's Disease. She continues carb/levo 1 tablet four times daily. She was last seen 01/2022 by [name] and doing fairly well despite left sided knee pain. Since,    01/19/2022 SA: She reports feeling overall stable.  She still has left-sided knee pain, her right knee was replaced and is doing overall quite well.  She has not had any recent falls, she uses a rolling walker, also inside the home.  She denies any significant constipation, she has had intermittent drooling during the day, not so much at night.  She feels that her medication working well enough on a 4 hourly basis.  She had physical therapy through neuro rehab at [name].  She requested to stop physical therapy due to worsening knee pain   [name] returns for follow up for PD. She continues generic Sinemet 1 tablet four times daily. She was last seen by [name] 06/2021 and reported symptoms were stable. Swallow eval was benign. She had completed ST. She presents with her daughter, today, who aids in history. She has had some progressive weakness over the past few months. She had a really bad day last week where she had a difficult time walking and controlling her walker. No concerns of stroke like symptoms. She feels that she is slowly improving. She does have generalized weakness of both lower extremities. No improvement after taking Sinemet. She denies falls. She uses Rolator at all times. Appetite is decent. She eats 2 full meals a day and snacks regularly. She is drinking Boost. She denies trouble swallowing. She has a peddler that she uses daily. She tries to walk around the home throughout the day. She is able to perform ADLs with minimal assistance.Can make simple meals. Memory is stable. She is able to  dose her medications. She does not drive.   BP is elevated, today. She does not routinely check readings at home. She takes olmesartan and amlodipine daily. No chest pain or shob. She denies any concerns of illness. No viral or urinary symptoms.   Her daughter lives in [name]. She is requesting FMLA paperwork so that she may come to [name] to help care for her mother and father.   06/25/2021 SA: Today, 06/25/2021: she reports feeling about the same.  She completed speech therapy in [name].  She has a reevaluation pending for March 2023.  She has not fallen.  She takes Sinemet 4 times a day starting at 6 AM.  She stopped taking her gabapentin as she did not benefit from it.  She was using it for her knee pain.  She continues to have left knee pain.  She uses naproxen as needed.  No significant constipation is reported, memory and mood are stable, some mild forgetfulness per husband.  [facility] ALL:  Black returns for follow up for PD. She continues generic Sinemet 1 tablet QID. She feels that she is doing fairly well. Right arm tremor is improved. She notices the tremor from time to time but states it is much better and not bothersome. She denies changes in gait. No falls. She uses her Rolator at all times. She is sleeping well. Memory is good. She does have concerns of difficulty swallowing and feels that her speech has changed. She reports getting "choked up" nearly  every day. Usually only when drinking water. She is able to cough a time or two and then feels fine. She feels that her speech is more slurred than it used to be. She does not have difficulty with conversation but feels that her speech sounds slurred. She does not notice any improvement or changes following Sinemet dosing. She continues follow up with PCP regularly. She reports neck pain is stable.   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  She has not fallen, she uses a walker inside and  outside.  She tries to exercise in the form of walking and she also has a pedaling device.  She tries to hydrate well, she drinks plenty of water with her medications and also in between.  She is not currently suffer from any significant constipation.  Her blood sugar control is good by her recollection and her A1c was less than seven as far she knows.  She has regular checkup with her primary care physician or nurse practitioner.  She takes her Sinemet four times a day on schedule, typically four hourly starting at 6 AM.  She had recent flare of the neck pain.  She was given a steroid course for 4 days through her primary care and lidocaine patches and feels improved, she no longer uses the lidocaine patches.  [facility] ALL:  [name] is a 82 y.o. female here today for follow up for PD. She is taking Sinemet 1 tablet four times daily. She feels that she is doing better. She is tolerating Sinemet. She is followed closely by PCP. She ia diabetic and reports bilateral feet numbness. She is taking gabapentin '300mg'$  PRN for knee pain but PCP recently suggested that she take gabapentin every day. She has not increased dosing. She does not grogginess with taking gabapentin. She continues to have difficulty with urinary frequency and incontinence. She has not seen urology. She continues to use a rolling walker. She lives with her husband. She does not drive. She helps with finances. She doses her own medications. She is able to perform ADL's independently. She denies memory concerns.   HISTORY: (copied from [facility] note on 10/10/2019)  [name] is a 82 year old right-handed woman with an underlying complex medical history of hypertension, hyperlipidemia, diastolic dysfunction, chronic kidney disease, diverticulosis, diabetes, leg numbness, renal artery aneurysm, obesity, left ventricular hypertrophy, gout, arthritis, history of pneumonia, and history of migraines, who presents for FU consultation of her  parkinsonism. The patient is unaccompanied today.  I first met her on 07/11/2019 at the request of her primary care provider, at which time she reported a several month history of hand tremors, right side more than left and also additional motor symptoms including slowness.  She had on examination findings in keeping with parkinsonism with right-sided lateralization noted.  Her history and exam were in keeping with right-sided Parkinson's disease and she was advised to start Sinemet at low-dose with gradual titration.   Today, 10/10/2019: She reports that the Sinemet has helped her tremor.  She tolerates it.  She has noticed more bladder dyscontrol and urinary incontinence, some during the day and also at night.  She has never seen urology.  She denies any symptoms concerning for urinary tract infection.  She has an appointment for blood work and follow-up with her primary care physician later this month.  She has an appointment for her eye exam tomorrow.  She has taken the Covid vaccine, had some  side effects after the second shot including feeling weaker and sore in the arm for about 24 hours but no lasting issues.  She denies constipation, sleeps well, mood is stable, no falls.  She uses a rolling walker.  She has residual right knee pain.  She had right knee replacement.  She also reports that she really needs a left knee replacement but is reluctant to pursue this.  She no longer takes propranolol.   The patient's allergies, current medications, family history, past medical history, past social history, past surgical history and problem list were reviewed and updated as appropriate.    Previously:    07/11/19: (She) reports a hand tremor for the past several months, less than a year.  Started in her right hand.  She has noticed slowness and difficulty with fine motor skills.  She has had balance problems.  She had a right total knee replacement in October 2020 and fell in December 2020.  Thankfully, she did  not injure her right knee but she had some pain.  She was advised to start gabapentin.  In the past, she had received injection into her lower back for lower back pain but  has not had any recent pain in the lower back.  She also reports that she may need a replacement of her left knee but currently does not have any significant left knee pain.  She had been using a 2 wheeled walker prior to her fall and actually had graduated to a cane after finishing therapy.  After the fall she started using her rolling walker.  She denies any significant constipation, she is not aware of any family history of Parkinson's disease.  She denies any significant memory issues.  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  She is a non-smoker and does not drink any alcohol.  She drinks caffeine and very little amounts.  She reports some difficulty sleeping, she has nocturia and also reports incontinence at night and also during the day, wears depends.  I reviewed your office note from 07/04/2019.  She was noted to have bilateral hand tremors.  She was advised to continue with the propranolol.  She has been on it for a few months as I understand.  She does not notice much in the way of benefit from it.   REVIEW OF SYSTEMS: Out of a complete 14 system review of symptoms, the patient complains only of the following symptoms, difficulty swallowing, slurred speech, tremor, numbness of feet, knee pain and all other reviewed systems are negative.  ALLERGIES: No Known Allergies  HOME MEDICATIONS: Outpatient Medications Prior to Visit  Medication Sig Dispense Refill   acetaminophen (TYLENOL) 500 MG tablet Take 500 mg by mouth every 6 (six) hours as needed for moderate pain.      amLODipine (NORVASC) 5 MG tablet Take 5 mg by mouth daily.      carbidopa-levodopa (SINEMET IR) 25-100 MG tablet Take 1 tablet by mouth 4 (four) times daily. At 6 AM, 10 AM, 2 PM and 6 PM 360 tablet 3    chlorthalidone (HYGROTON) 25 MG tablet Take 12.5 mg by mouth daily.     gabapentin (NEURONTIN) 300 MG capsule Take 1 capsule (300 mg total) by mouth 3 (three) times daily. Start with 1 capsule before bed for 5 days, may then add a second capsule at dinner for 5 days, if needed may add a third capsule in the morning  for a total of three capsules a day. 90 capsule 1   metFORMIN (GLUCOPHAGE) 500 MG tablet Take 500 mg by mouth daily with breakfast.     naproxen (NAPROSYN) 500 MG tablet Take 500 mg by mouth as needed.     NONFORMULARY OR COMPOUNDED ITEM Use as directed 1 each 1   olmesartan (BENICAR) 20 MG tablet Take 20 mg by mouth daily.     simvastatin (ZOCOR) 40 MG tablet Take 40 mg by mouth every evening.     No facility-administered medications prior to visit.    PAST MEDICAL HISTORY: Past Medical History:  Diagnosis Date   Aortic atherosclerosis ([place]) 09/11/2016   noted on CXR   Arthritis    Baker's cyst, left 2014   Small, Left   Bilateral leg numbness    Cataract    Bilateral   Diabetes mellitus without complication ([place])    Diverticulosis 10/09/2016   Noted on CT abd/pelvis   Gout    Grade I diastolic dysfunction 0000000   Noted on ECHO   Hepatic cyst 10/09/2016   Noted on CT abd/pelvis   History of iron deficiency anemia    History of migraine    Hypercholesteremia    Hypertension    Internal hemorrhoids    Lateral meniscal tear 2014   Left   LVH (left ventricular hypertrophy) 03/03/2018   noted on EKG   Obesity    PMB (postmenopausal bleeding)    Pneumonia 2018   Right upper lower   PONV (postoperative nausea and vomiting)    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   Right 1.3 cm, Noted on CT Chest, pt unaware   Renal cyst 10/09/2016   Noted on CT abd/pelvis   Thoracic aortic aneurysm ([place]) 12/09/2016   ascending 4.0 cm, noted on CT chest, pt unaware    PAST SURGICAL HISTORY: Past Surgical History:  Procedure Laterality Date   ABDOMINAL HYSTERECTOMY      CATARACT EXTRACTION Right 09/2010   CATARACT EXTRACTION Left 10/2010   COLONOSCOPY     DILATION AND CURETTAGE OF UTERUS     TOTAL KNEE ARTHROPLASTY Right 04/21/2018   Procedure: TOTAL KNEE ARTHROPLASTY;  Surgeon: Earlie Server, MD;  Location: [facility];  Service: Orthopedics;  Laterality: Right;  Adductor Block    FAMILY HISTORY: Family History  Problem Relation Age of Onset   Kidney disease Mother    Diabetes Mother    Congestive Heart Failure Mother    Cancer Father        unsure of type   Multiple sclerosis Neg Hx    Parkinson's disease Neg Hx     SOCIAL HISTORY: Social History   Socioeconomic History   Marital status: Married    Spouse name: Not on file   Number of children: 2   Years of education: 12   Highest education level: High school graduate  Occupational History   Occupation: Retired  Tobacco Use   Smoking status: Never   Smokeless tobacco: Never  Scientific laboratory technician Use: Never used  Substance and Sexual Activity   Alcohol use: No   Drug use: No   Sexual activity: Not on file  Other Topics Concern   Not on file  Social History Narrative   Lives at home with her husband.   Right-handed.   1/4 cup coffee per day.   Social Determinants of Health   Financial Resource Strain: Not on file  Food Insecurity: Not on file  Transportation Needs: Not on file  Physical Activity: Not on file  Stress: Not on file  Social Connections: Not on file  Intimate Partner Violence: Not on file      PHYSICAL EXAM  There were no vitals filed for this visit.   There is no height or weight on file to calculate BMI.  Generalized: Well developed, in no acute distress  Cardiology: normal rate and rhythm, no murmur noted Respiratory: clear to auscultation bilaterally  Neurological examination  Mentation: Alert oriented to time, place, history taking. Follows all commands speech and language fluent, mild hypophonia, no dysarthria noted.  Cranial nerve II-XII:  Pupils were equal round reactive to light. Extraocular movements were full, visual field were full on confrontational test. Facial sensation and strength were normal. Head turning and shoulder shrug  were normal and symmetric. Motor: The motor testing reveals 4+ over 5 strength of all 4 extremities. Good symmetric motor tone is noted throughout. Right hand resting tremor noted, very slight left hand tremor. Bradykinesia noted with finger taps, bilateral cogwheel rigidity, worse on right. Dysrhythmia noted with toe taps.  Sensory: Sensory testing is intact to soft touch on all 4 extremities. No evidence of extinction is noted.  Coordination: Cerebellar testing reveals slow finger-nose-finger and heel-to-shin bilaterally.  Gait and station: Able to push to standing position with multiple attempts. Stooped posture. Gait is short but stable with rolling walker. Tandem not attempted.  Reflexes: Deep tendon reflexes are symmetric and normal bilaterally.   DIAGNOSTIC DATA (LABS, IMAGING, TESTING) - I reviewed patient records, labs, notes, testing and imaging myself where available.      No data to display           Lab Results  Component Value Date   WBC 9.9 04/24/2018   HGB 10.5 (L) 04/24/2018   HCT 32.5 (L) 04/24/2018   MCV 92.3 04/24/2018   PLT 178 04/24/2018      Component Value Date/Time   NA 141 04/23/2018 0340   K 3.8 04/23/2018 0340   CL 105 04/23/2018 0340   CO2 29 04/23/2018 0340   GLUCOSE 114 (H) 04/23/2018 0340   BUN 17 04/23/2018 0340   CREATININE 0.88 04/23/2018 0340   CALCIUM 8.4 (L) 04/23/2018 0340   PROT 7.2 03/03/2018 1028   ALBUMIN 3.9 03/03/2018 1028   AST 14 (L) 03/03/2018 1028   ALT 12 03/03/2018 1028   ALKPHOS 77 03/03/2018 1028   BILITOT 0.5 03/03/2018 1028   GFRNONAA >60 04/23/2018 0340   GFRAA >60 04/23/2018 0340   Lab Results  Component Value Date   CHOL 176 10/18/2008   HDL 39 (L) 10/18/2008   LDLCALC 107 (H) 10/18/2008   TRIG 149 10/18/2008    CHOLHDL 4.5 Ratio 10/18/2008   Lab Results  Component Value Date   HGBA1C 6.0 (H) 04/14/2018   No results found for: "VITAMINB12" Lab Results  Component Value Date   TSH 0.492 10/06/2016       ASSESSMENT AND PLAN 82 y.o. year old female  has a past medical history of Aortic atherosclerosis ([place]) (09/11/2016), Arthritis, Baker's cyst, left (2014), Bilateral leg numbness, Cataract, Diabetes mellitus without complication ([place]), Diverticulosis (10/09/2016), Gout, Grade I diastolic dysfunction ([place]), Hepatic cyst (10/09/2016), History of iron deficiency anemia, History of migraine, Hypercholesteremia, Hypertension, Internal hemorrhoids, Lateral meniscal tear (2014), LVH (left ventricular hypertrophy) (03/03/2018), Obesity, PMB (postmenopausal bleeding), Pneumonia (2018), PONV (postoperative nausea and vomiting), 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 here with   No diagnosis found.  Yilin reports progressive weakness of bilateral lower extremities with notable worsening last week. Fortunately, she seems to be better, today. She is tolerating Sinemet 1 tablet 4 times daily and feels tremor has improved, however, has not noted any improvement of weakness after taking dose. I will send her for PT/OT evaluation with neuro rehab. I have advised that she follow up with PCP due to elevated blood pressure readings. Also discussed possibility of getting blood to rule out infection. She will call to have performed with PCP. Fortunately, no obvious signs of infection. She was encouraged to stay well hydrated and eat regular meals. She will focus on regular physical and mental activity. Safety precautions advised. She will follow up in 4-6 months. Will alternate her care with [name] due to progressive symptoms. She verbalizes understanding and agreement with this plan.    No orders of the defined types were placed in  this encounter.     No orders of the defined types were placed in this encounter.       Debbora Presto, FNP-C 08/03/2022, 1:10 PM [facility] [address], [place], [place] [address] [phone]

## 2022-08-04 ENCOUNTER — Ambulatory Visit: Payer: Medicare HMO | Admitting: Family Medicine

## 2022-08-04 ENCOUNTER — Encounter: Payer: Self-pay | Admitting: Family Medicine

## 2022-08-04 VITALS — BP 142/81 | HR 74 | Ht 62.0 in | Wt 157.5 lb

## 2022-08-04 DIAGNOSIS — W19XXXA Unspecified fall, initial encounter: Secondary | ICD-10-CM | POA: Diagnosis not present

## 2022-08-04 DIAGNOSIS — R29898 Other symptoms and signs involving the musculoskeletal system: Secondary | ICD-10-CM | POA: Diagnosis not present

## 2022-08-04 DIAGNOSIS — G20A1 Parkinson's disease without dyskinesia, without mention of fluctuations: Secondary | ICD-10-CM | POA: Diagnosis not present

## 2022-08-16 ENCOUNTER — Ambulatory Visit: Payer: Medicare HMO | Admitting: Podiatry

## 2022-09-02 DIAGNOSIS — M25512 Pain in left shoulder: Secondary | ICD-10-CM | POA: Diagnosis not present

## 2022-09-30 DIAGNOSIS — E782 Mixed hyperlipidemia: Secondary | ICD-10-CM | POA: Diagnosis not present

## 2022-09-30 DIAGNOSIS — E1122 Type 2 diabetes mellitus with diabetic chronic kidney disease: Secondary | ICD-10-CM | POA: Diagnosis not present

## 2022-09-30 DIAGNOSIS — R946 Abnormal results of thyroid function studies: Secondary | ICD-10-CM | POA: Diagnosis not present

## 2022-09-30 DIAGNOSIS — M109 Gout, unspecified: Secondary | ICD-10-CM | POA: Diagnosis not present

## 2022-10-06 DIAGNOSIS — D509 Iron deficiency anemia, unspecified: Secondary | ICD-10-CM | POA: Diagnosis not present

## 2022-10-06 DIAGNOSIS — I129 Hypertensive chronic kidney disease with stage 1 through stage 4 chronic kidney disease, or unspecified chronic kidney disease: Secondary | ICD-10-CM | POA: Diagnosis not present

## 2022-10-06 DIAGNOSIS — G20A1 Parkinson's disease without dyskinesia, without mention of fluctuations: Secondary | ICD-10-CM | POA: Diagnosis not present

## 2022-10-06 DIAGNOSIS — G20B1 Parkinson's disease with dyskinesia, without mention of fluctuations: Secondary | ICD-10-CM | POA: Diagnosis not present

## 2022-10-06 DIAGNOSIS — E1122 Type 2 diabetes mellitus with diabetic chronic kidney disease: Secondary | ICD-10-CM | POA: Diagnosis not present

## 2022-10-06 DIAGNOSIS — M1711 Unilateral primary osteoarthritis, right knee: Secondary | ICD-10-CM | POA: Diagnosis not present

## 2022-10-06 DIAGNOSIS — N1831 Chronic kidney disease, stage 3a: Secondary | ICD-10-CM | POA: Diagnosis not present

## 2022-10-06 DIAGNOSIS — E782 Mixed hyperlipidemia: Secondary | ICD-10-CM | POA: Diagnosis not present

## 2022-10-06 DIAGNOSIS — R946 Abnormal results of thyroid function studies: Secondary | ICD-10-CM | POA: Diagnosis not present

## 2022-10-21 ENCOUNTER — Encounter: Payer: Self-pay | Admitting: Podiatry

## 2022-10-21 ENCOUNTER — Ambulatory Visit: Payer: Medicare HMO | Admitting: Podiatry

## 2022-10-21 DIAGNOSIS — M79675 Pain in left toe(s): Secondary | ICD-10-CM

## 2022-10-21 DIAGNOSIS — E785 Hyperlipidemia, unspecified: Secondary | ICD-10-CM

## 2022-10-21 DIAGNOSIS — M79674 Pain in right toe(s): Secondary | ICD-10-CM | POA: Diagnosis not present

## 2022-10-21 DIAGNOSIS — E1169 Type 2 diabetes mellitus with other specified complication: Secondary | ICD-10-CM | POA: Diagnosis not present

## 2022-10-21 DIAGNOSIS — B351 Tinea unguium: Secondary | ICD-10-CM | POA: Diagnosis not present

## 2022-10-21 DIAGNOSIS — G20A1 Parkinson's disease without dyskinesia, without mention of fluctuations: Secondary | ICD-10-CM | POA: Diagnosis not present

## 2022-10-21 NOTE — Progress Notes (Signed)
This patient returns to my office for at risk foot care.  This patient requires this care by a professional since this patient will be at risk due to having diabetes.  This patient is unable to cut nails herself since the patient cannot reach her nails.These nails are painful walking and wearing shoes.  This patient presents for at risk foot care today.  General Appearance  Alert, conversant and in no acute stress.  Vascular  Dorsalis pedis and posterior tibial  pulses are palpable  bilaterally.  Capillary return is within normal limits  bilaterally. Temperature is within normal limits  bilaterally.  Neurologic  Senn-Weinstein monofilament wire test within normal limits  bilaterally. Muscle power within normal limits bilaterally.  Nails Thick disfigured discolored nails with subungual debris  from hallux to fifth toes bilaterally. No evidence of bacterial infection or drainage bilaterally.  Orthopedic  No limitations of motion  feet .  No crepitus or effusions noted.  Hammer toes  B/L.  Skin  normotropic skin with no porokeratosis noted bilaterally.  No signs of infections or ulcers noted.    Onychomycosis  Pain in right toes  Pain in left toes  Consent was obtained for treatment procedures.   Mechanical debridement of nails 1-5  bilaterally performed with a nail nipper.  Told her to see neurologist for gabapentin due to her Parkinsons.   Return office visit   3 months                   Told patient to return for periodic foot care and evaluation due to potential at risk complications.   Helane Gunther DPM

## 2022-11-07 ENCOUNTER — Other Ambulatory Visit: Payer: Self-pay | Admitting: Neurology

## 2023-01-27 ENCOUNTER — Ambulatory Visit: Payer: Medicare HMO | Admitting: Podiatry

## 2023-02-08 NOTE — Progress Notes (Addendum)
PATIENT: [name] DOB: [birth year]  REASON FOR VISIT: follow up HISTORY FROM: patient  Chief Complaint  Patient presents with   Follow-up    Pt in room 2, daughter in room. Pt here for Parkinson follow up. Pt said a couple weeks ago she started feeling weaker, said her legs feel weak, uses walker.  No recent falls, pt daughter said pt said pt tells her " she doesn't feel good". Pt reports her feet are numb, legs are tender to touch.      HISTORY OF PRESENT ILLNESS:  02/09/23 ALL: Alle returns for follow up for PD. I last saw her 07/2022 and she was doing well on carb-levo 1 tab QID. Since, she reports not doing well. She tells me that she is having significant tenderness in bilateral lower ext. She described numbness of both feet and tenderness to touch of bilateral shins. She was previously prescribed gabapentin by podiatry but when seeing a new provider, he would not refill. She reports pain was significantly better on gabapentin. She was tolerating it well taking 2-3 capsules daily. She has continued carb/levo. She feels tremor is stable. She has noted more weakness of bilateral lower ext. Unclear if due to more pain. She continues to use stationary peddler. She did not feel PT was helpful last year. She denies falls. She continues to use Rolator. She has had more trouble swallowing. She is taking medications with applesauce. Her daughter reports she is coughing more when drinking liquids.   08/04/2022 ALL: [name] returns for follow up for Parkinson's Disease. She continues carb/levo 1 tablet four times daily. She was last seen 01/2022 by Dr Frances Furbish and doing fairly well despite left sided knee pain. Since, she reports no significant changes. She feels tremor remains stable. She is tolerating carb/levo but has noted sleepiness after dosing. She sleeps well at night but also takes short naps during the day. She is eating normally. She feels mood and memory are stable. She is having  continued bilateral lower ext weakness. PT/OT last year did seem to help some but she requested to discontinue due to worsening knee pain. She is using her Rolator at all times. She has a difficult time pushing to standing position at times. She did have a fall about a month ago. She was trying to reach to adjust thermostat and lost her balance. No significant injuries. She continues to have bilateral knee pain.   01/19/2022 SA: She reports feeling overall stable.  She still has left-sided knee pain, her right knee was replaced and is doing overall quite well.  She has not had any recent falls, she uses a rolling walker, also inside the home.  She denies any significant constipation, she has had intermittent drooling during the day, not so much at night.  She feels that her medication working well enough on a 4 hourly basis.  She had physical therapy through neuro rehab at [facility].  She requested to stop physical therapy due to worsening knee pain   09/17/2021 ALL: [name] returns for follow up for PD. She continues generic Sinemet 1 tablet four times daily. She was last seen by Dr Frances Furbish 06/2021 and reported symptoms were stable. Swallow eval was benign. She had completed ST. She presents with her daughter, today, who aids in history. She has had some progressive weakness over the past few months. She had a really bad day last week where she had a difficult time walking and controlling her walker. No concerns of  stroke like symptoms. She feels that she is slowly improving. She does have generalized weakness of both lower extremities. No improvement after taking Sinemet. She denies falls. She uses Rolator at all times. Appetite is decent. She eats 2 full meals a day and snacks regularly. She is drinking Boost. She denies trouble swallowing. She has a peddler that she uses daily. She tries to walk around the home throughout the day. She is able to perform ADLs with minimal assistance.Can make simple meals. Memory  is stable. She is able to dose her medications. She does not drive.   BP is elevated, today. She does not routinely check readings at home. She takes olmesartan and amlodipine daily. No chest pain or shob. She denies any concerns of illness. No viral or urinary symptoms.   Her daughter lives in [place]. She is requesting FMLA paperwork so that she may come to [place] to help care for her mother and father.   06/25/2021 SA: Today, 06/25/2021: she reports feeling about the same.  She completed speech therapy in [place].  She has a reevaluation pending for March 2023.  She has not fallen.  She takes Sinemet 4 times a day starting at 6 AM.  She stopped taking her gabapentin as she did not benefit from it.  She was using it for her knee pain.  She continues to have left knee pain.  She uses naproxen as needed.  No significant constipation is reported, memory and mood are stable, some mild forgetfulness per husband.  02/26/2021 ALL:  [name] returns for follow up for PD. She continues generic Sinemet 1 tablet QID. She feels that she is doing fairly well. Right arm tremor is improved. She notices the tremor from time to time but states it is much better and not bothersome. She denies changes in gait. No falls. She uses her Rolator at all times. She is sleeping well. Memory is good. She does have concerns of difficulty swallowing and feels that her speech has changed. She reports getting "choked up" nearly every day. Usually only when drinking water. She is able to cough a time or two and then feels fine. She feels that her speech is more slurred than it used to be. She does not have difficulty with conversation but feels that her speech sounds slurred. She does not notice any improvement or changes following Sinemet dosing. She continues follow up with PCP regularly. She reports neck pain is stable.   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  She has not fallen, she  uses a walker inside and outside.  She tries to exercise in the form of walking and she also has a pedaling device.  She tries to hydrate well, she drinks plenty of water with her medications and also in between.  She is not currently suffer from any significant constipation.  Her blood sugar control is good by her recollection and her A1c was less than seven as far she knows.  She has regular checkup with her primary care physician or nurse practitioner.  She takes her Sinemet four times a day on schedule, typically four hourly starting at 6 AM.  She had recent flare of the neck pain.  She was given a steroid course for 4 days through her primary care and lidocaine patches and feels improved, she no longer uses the lidocaine patches.  01/16/2020 ALL:  [name] is a 82 y.o. female here today for follow up for  PD. She is taking Sinemet 1 tablet four times daily. She feels that she is doing better. She is tolerating Sinemet. She is followed closely by PCP. She ia diabetic and reports bilateral feet numbness. She is taking gabapentin 300mg  PRN for knee pain but PCP recently suggested that she take gabapentin every day. She has not increased dosing. She does not grogginess with taking gabapentin. She continues to have difficulty with urinary frequency and incontinence. She has not seen urology. She continues to use a rolling walker. She lives with her husband. She does not drive. She helps with finances. She doses her own medications. She is able to perform ADL's independently. She denies memory concerns.   HISTORY: (copied from Dr Teofilo Pod note on 10/10/2019)  Ms. Ashworth is a 82 year old right-handed woman with an underlying complex medical history of hypertension, hyperlipidemia, diastolic dysfunction, chronic kidney disease, diverticulosis, diabetes, leg numbness, renal artery aneurysm, obesity, left ventricular hypertrophy, gout, arthritis, history of pneumonia, and history of migraines, who presents for  FU consultation of her parkinsonism. The patient is unaccompanied today.  I first met her on 07/11/2019 at the request of her primary care provider, at which time she reported a several month history of hand tremors, right side more than left and also additional motor symptoms including slowness.  She had on examination findings in keeping with parkinsonism with right-sided lateralization noted.  Her history and exam were in keeping with right-sided Parkinson's disease and she was advised to start Sinemet at low-dose with gradual titration.   Today, 10/10/2019: She reports that the Sinemet has helped her tremor.  She tolerates it.  She has noticed more bladder dyscontrol and urinary incontinence, some during the day and also at night.  She has never seen urology.  She denies any symptoms concerning for urinary tract infection.  She has an appointment for blood work and follow-up with her primary care physician later this month.  She has an appointment for her eye exam tomorrow.  She has taken the Covid vaccine, had some side effects after the second shot including feeling weaker and sore in the arm for about 24 hours but no lasting issues.  She denies constipation, sleeps well, mood is stable, no falls.  She uses a rolling walker.  She has residual right knee pain.  She had right knee replacement.  She also reports that she really needs a left knee replacement but is reluctant to pursue this.  She no longer takes propranolol.   The patient's allergies, current medications, family history, past medical history, past social history, past surgical history and problem list were reviewed and updated as appropriate.    Previously:    07/11/19: (She) reports a hand tremor for the past several months, less than a year.  Started in her right hand.  She has noticed slowness and difficulty with fine motor skills.  She has had balance problems.  She had a right total knee replacement in October 2020 and fell in December 2020.   Thankfully, she did not injure her right knee but she had some pain.  She was advised to start gabapentin.  In the past, she had received injection into her lower back for lower back pain but  has not had any recent pain in the lower back.  She also reports that she may need a replacement of her left knee but currently does not have any significant left knee pain.  She had been using a 2 wheeled walker prior to her fall and  actually had graduated to a cane after finishing therapy.  After the fall she started using her rolling walker.  She denies any significant constipation, she is not aware of any family history of Parkinson's disease.  She denies any significant memory issues.  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  She is a non-smoker and does not drink any alcohol.  She drinks caffeine and very little amounts.  She reports some difficulty sleeping, she has nocturia and also reports incontinence at night and also during the day, wears depends.  I reviewed your office note from 07/04/2019.  She was noted to have bilateral hand tremors.  She was advised to continue with the propranolol.  She has been on it for a few months as I understand.  She does not notice much in the way of benefit from it.   REVIEW OF SYSTEMS: Out of a complete 14 system review of symptoms, the patient complains only of the following symptoms, tremor, numbness of feet, knee pain and all other reviewed systems are negative.  ALLERGIES: No Known Allergies  HOME MEDICATIONS: Outpatient Medications Prior to Visit  Medication Sig Dispense Refill   acetaminophen (TYLENOL) 500 MG tablet Take 500 mg by mouth every 6 (six) hours as needed for moderate pain.      allopurinol (ZYLOPRIM) 100 MG tablet Take 100 mg by mouth daily.     amLODipine (NORVASC) 5 MG tablet Take 5 mg by mouth daily.      chlorthalidone (HYGROTON) 25 MG tablet Take 12.5 mg by mouth daily.     metFORMIN  (GLUCOPHAGE) 500 MG tablet Take 500 mg by mouth daily with breakfast.     olmesartan (BENICAR) 20 MG tablet Take 20 mg by mouth daily.     simvastatin (ZOCOR) 40 MG tablet Take 40 mg by mouth every evening.     carbidopa-levodopa (SINEMET IR) 25-100 MG tablet TAKE 1 TABLET BY MOUTH 4 (FOUR) TIMES DAILY AT 6 AM, 10 AM, 2 PM AND 6 PM 360 tablet 3   gabapentin (NEURONTIN) 300 MG capsule Take 1 capsule (300 mg total) by mouth 3 (three) times daily. Start with 1 capsule before bed for 5 days, may then add a second capsule at dinner for 5 days, if needed may add a third capsule in the morning for a total of three capsules a day. 90 capsule 1   No facility-administered medications prior to visit.    PAST MEDICAL HISTORY: Past Medical History:  Diagnosis Date   Aortic atherosclerosis (HCC) 09/11/2016   noted on CXR   Arthritis    Baker's cyst, left 2014   Small, Left   Bilateral leg numbness    Cataract    Bilateral   Diabetes mellitus without complication (HCC)    Diverticulosis 10/09/2016   Noted on CT abd/pelvis   Gout    Grade I diastolic dysfunction 09/12/2016   Noted on ECHO   Hepatic cyst 10/09/2016   Noted on CT abd/pelvis   History of iron deficiency anemia    History of migraine    Hypercholesteremia    Hypertension    Internal hemorrhoids    Lateral meniscal tear 2014   Left   LVH (left ventricular hypertrophy) 03/03/2018   noted on EKG   Obesity    PMB (postmenopausal bleeding)    Pneumonia 2018   Right upper lower   PONV (postoperative nausea and vomiting)    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   Right 1.3 cm, Noted on CT Chest, pt unaware   Renal cyst 10/09/2016   Noted on CT abd/pelvis   Thoracic aortic aneurysm (HCC) 12/09/2016   ascending 4.0 cm, noted on CT chest, pt unaware    PAST SURGICAL HISTORY: Past Surgical History:  Procedure Laterality Date   ABDOMINAL HYSTERECTOMY     CATARACT EXTRACTION Right 09/2010   CATARACT EXTRACTION Left 10/2010    COLONOSCOPY     DILATION AND CURETTAGE OF UTERUS     TOTAL KNEE ARTHROPLASTY Right 04/21/2018   Procedure: TOTAL KNEE ARTHROPLASTY;  Surgeon: Frederico Hamman, MD;  Location: [facility];  Service: Orthopedics;  Laterality: Right;  Adductor Block    FAMILY HISTORY: Family History  Problem Relation Age of Onset   Kidney disease Mother    Diabetes Mother    Congestive Heart Failure Mother    Cancer Father        unsure of type   Multiple sclerosis Neg Hx    Parkinson's disease Neg Hx     SOCIAL HISTORY: Social History   Socioeconomic History   Marital status: Married    Spouse name: Not on file   Number of children: 2   Years of education: 12   Highest education level: High school graduate  Occupational History   Occupation: Retired  Tobacco Use   Smoking status: Never   Smokeless tobacco: Never  Vaping Use   Vaping status: Never Used  Substance and Sexual Activity   Alcohol use: No   Drug use: No   Sexual activity: Not on file  Other Topics Concern   Not on file  Social History Narrative   Lives at home with her husband.   Right-handed.   1/4 cup coffee per day.   Social Determinants of Health   Financial Resource Strain: Not on file  Food Insecurity: Not on file  Transportation Needs: Not on file  Physical Activity: Not on file  Stress: Not on file  Social Connections: Not on file  Intimate Partner Violence: Not on file      PHYSICAL EXAM  Vitals:   02/09/23 0955  BP: (!) 173/80  Height: 5\' 2"  (1.575 m)      Body mass index is 28.81 kg/m.  Generalized: Well developed, in no acute distress  Cardiology: normal rate and rhythm, no murmur noted Respiratory: clear to auscultation bilaterally  Neurological examination  Mentation: Alert oriented to time, place, history taking. Follows all commands speech and language fluent, mild hypophonia, no dysarthria noted.  Cranial nerve II-XII: Pupils were equal round reactive to light. Extraocular movements  were full, visual field were full on confrontational test. Facial sensation and strength were normal. Head turning and shoulder shrug  were normal and symmetric. Motor: The motor testing reveals 4+ over 5 strength of all 4 extremities. Good symmetric motor tone is noted throughout. Right hand resting tremor noted, very slight left hand tremor. Bradykinesia noted with finger taps, bilateral cogwheel rigidity, worse on right. Dysrhythmia noted with toe taps.  Sensory: Sensory testing is intact to soft touch on all 4 extremities. No evidence of extinction is noted.  Coordination: Cerebellar testing reveals slow finger-nose-finger and heel-to-shin bilaterally.  Gait and station: Gait not assessed, today, due to weakness  Reflexes: Deep tendon reflexes are symmetric and normal bilaterally.   DIAGNOSTIC DATA (LABS, IMAGING, TESTING) - I reviewed patient records, labs, notes, testing and imaging myself where available.      No data to display  Lab Results  Component Value Date   WBC 9.9 04/24/2018   HGB 10.5 (L) 04/24/2018   HCT 32.5 (L) 04/24/2018   MCV 92.3 04/24/2018   PLT 178 04/24/2018      Component Value Date/Time   NA 141 04/23/2018 0340   K 3.8 04/23/2018 0340   CL 105 04/23/2018 0340   CO2 29 04/23/2018 0340   GLUCOSE 114 (H) 04/23/2018 0340   BUN 17 04/23/2018 0340   CREATININE 0.88 04/23/2018 0340   CALCIUM 8.4 (L) 04/23/2018 0340   PROT 7.2 03/03/2018 1028   ALBUMIN 3.9 03/03/2018 1028   AST 14 (L) 03/03/2018 1028   ALT 12 03/03/2018 1028   ALKPHOS 77 03/03/2018 1028   BILITOT 0.5 03/03/2018 1028   GFRNONAA >60 04/23/2018 0340   GFRAA >60 04/23/2018 0340   Lab Results  Component Value Date   CHOL 176 10/18/2008   HDL 39 (L) 10/18/2008   LDLCALC 107 (H) 10/18/2008   TRIG 149 10/18/2008   CHOLHDL 4.5 Ratio 10/18/2008   Lab Results  Component Value Date   HGBA1C 6.0 (H) 04/14/2018   No results found for: "VITAMINB12" Lab Results  Component Value  Date   TSH 0.492 10/06/2016       ASSESSMENT AND PLAN 82 y.o. year old female  has a past medical history of Aortic atherosclerosis (HCC) (09/11/2016), Arthritis, Baker's cyst, left (2014), Bilateral leg numbness, Cataract, Diabetes mellitus without complication (HCC), Diverticulosis (10/09/2016), Gout, Grade I diastolic dysfunction (09/12/2016), Hepatic cyst (10/09/2016), History of iron deficiency anemia, History of migraine, Hypercholesteremia, Hypertension, Internal hemorrhoids, Lateral meniscal tear (2014), LVH (left ventricular hypertrophy) (03/03/2018), Obesity, PMB (postmenopausal bleeding), Pneumonia (2018), PONV (postoperative nausea and vomiting), 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 here with     ICD-10-CM   1. Parkinson's disease without dyskinesia or fluctuating manifestations  G20.A1 Ambulatory referral to Speech Therapy    2. Dysphagia, unspecified type  R13.10 Ambulatory referral to Speech Therapy    3. Neuropathy  G62.9     4. Forgetfulness  R68.89     5. Weakness of both lower extremities  R29.898        Janean continues to note significant weakness of bilateral lower extremities. Now having painful numbness and tenderness as well. We have discussed need for continued activity and possibility of returning to PT. She is not interested at this time but will let me know if she changes her mind. She is tolerating Sinemet 1 tablet 4 times daily and feels tremor has improved. Will continue. I will restart gabapentin 300mg  daily. She may increase dose by 300mg  weekly as needed up to three times daily. Side effects reviewed. I will send her for swallow eval with ST. She was encouraged to stay well hydrated and eat regular meals. She will focus on regular physical and mental activity. Safety precautions advised. She will follow up with me in 4-6 months. Will continue alternating her care with Dr Frances Furbish.  She verbalizes understanding and agreement with this plan.    Orders Placed This Encounter  Procedures   Ambulatory referral to Speech Therapy    Referral Priority:   Routine    Referral Type:   Speech Therapy    Referral Reason:   Specialty Services Required    Requested Specialty:   Speech Pathology    Number of Visits Requested:   1      Meds ordered this encounter  Medications   gabapentin (NEURONTIN) 300  MG capsule    Sig: Take 1 capsule (300 mg total) by mouth 3 (three) times daily. Start with 1 capsule before bed for 5 days, may then add a second capsule at dinner for 5 days, if needed may add a third capsule in the morning for a total of three capsules a day.    Dispense:  90 capsule    Refill:  5    Order Specific Question:   Supervising Provider    Answer:   Anson Fret [[id number]]   carbidopa-levodopa (SINEMET IR) 25-100 MG tablet    Sig: Take 1 tablet by mouth 4 (four) times daily. At 6 AM, 10 AM, 2 PM and 6 PM    Dispense:  360 tablet    Refill:  3    Order Specific Question:   Supervising Provider    Answer:   Anson Fret [[id number]]     Addendum: Per ST referral note: "Recommend continue with regular/thin and continue to adminsiter meds with puree. Reviewed findings with Pt and Pt's daughter at length and recommend consider not using straws when Pt is tired or if they notice more consistent coughing episodes with use of straws. There is no further ST indicated at this time. Thank you for this referral."    I spent 30 minutes of face-to-face and non-face-to-face time with patient.  This included previsit chart review, lab review, study review, order entry, electronic health record documentation, patient education.   Shawnie Dapper, FNP-C 02/09/2023, 10:37 AM [facility] [address] [place], [place] [address] [phone]

## 2023-02-08 NOTE — Patient Instructions (Signed)
Below is our plan:  We will continue carbidopa levodopa 1 tablet four times daily. We will restart gabapentin 300mg  daily at bedtime for 1 weeks. If well tolerated, increase dose to twice daily for 1 week. If you continue to tolerate dose, you can increase it to three times daily. Please monitor for sedative side effects. We will refer you to speech therapy for a swallowing evaluation. Listen for a phone call to schedule evaluation through [facility] outpatient therapy.   Please make sure you are staying well hydrated. I recommend 50-60 ounces daily. Well balanced diet and regular exercise encouraged. Consistent sleep schedule with 6-8 hours recommended.   Please continue follow up with care team as directed.   Follow up with me in 6 months   You may receive a survey regarding today's visit. I encourage you to leave honest feed back as I do use this information to improve patient care. Thank you for seeing me today!

## 2023-02-09 ENCOUNTER — Encounter: Payer: Self-pay | Admitting: Family Medicine

## 2023-02-09 ENCOUNTER — Ambulatory Visit: Payer: Medicare HMO | Admitting: Family Medicine

## 2023-02-09 VITALS — BP 173/80 | Ht 62.0 in

## 2023-02-09 DIAGNOSIS — G20A1 Parkinson's disease without dyskinesia, without mention of fluctuations: Secondary | ICD-10-CM | POA: Diagnosis not present

## 2023-02-09 DIAGNOSIS — R6889 Other general symptoms and signs: Secondary | ICD-10-CM

## 2023-02-09 DIAGNOSIS — G629 Polyneuropathy, unspecified: Secondary | ICD-10-CM

## 2023-02-09 DIAGNOSIS — R29898 Other symptoms and signs involving the musculoskeletal system: Secondary | ICD-10-CM

## 2023-02-09 DIAGNOSIS — R1310 Dysphagia, unspecified: Secondary | ICD-10-CM | POA: Diagnosis not present

## 2023-02-09 MED ORDER — GABAPENTIN 300 MG PO CAPS: 300.0000 mg | ORAL_CAPSULE | Freq: Three times a day (TID) | ORAL | 5 refills | Status: DC

## 2023-02-09 MED ORDER — CARBIDOPA-LEVODOPA 25-100 MG PO TABS: ORAL_TABLET | ORAL | 3 refills | Status: DC

## 2023-02-14 ENCOUNTER — Telehealth: Payer: Self-pay | Admitting: Family Medicine

## 2023-02-14 NOTE — Telephone Encounter (Signed)
I called and LMVM for [name], who was calling for [name].  She is not walking.  Not sure what is going on, but if you feel that emergent, call 911 or go to ER. She has parkinson's. Will try tomorrow.

## 2023-02-14 NOTE — Telephone Encounter (Signed)
Pt's daughter, Annakay Jasmin. Wednesday when we got home she was walking. Now she is not walking at all. Saturday morning she was standing in the kitchen and she could not stand. She can not carry her own wait. Had to help her to the chair, now she can not walk. Would like a call back.,

## 2023-02-15 NOTE — Telephone Encounter (Addendum)
LMVM for [name],  daughter who called about pt to return call.

## 2023-02-15 NOTE — Telephone Encounter (Addendum)
I called daughter of pt.  I LMVM for her on # left but then call pts mobile and was able to speak to her [place] [phone].  Pt was seen last Wednesday and was walking.  But since Saturday has been not able to walk. Issues getting started, not able to take a step.  She can stand assisted. Only change in medication is taking the gabapentin 300mg  po bid at this time.  Still wil issues with swallowing (not called yet about test)  , taking her tablets with applesauce now.  Her sinemet is 25/100 po qid.  Eating ok otherwise, coughs some.  Not facial droop/no one sided weakness.  Has drooling but that is normal for her.  She has not had therapy for a while.  Suggestions?

## 2023-02-15 NOTE — Telephone Encounter (Signed)
REQUIRED PHONE NOTE: daughter has returned call to [place], [place].  Please call daughter back.

## 2023-02-15 NOTE — Telephone Encounter (Signed)
I called daughter and let her know what Shawnie Dapper, NP said.   " I recommend an urgent visit with PCP if able for labs and exam or evaluation in the ER.  I saw her recently and can not explain the sudden loss of lower ext strength. She was unable to walk at out appt and using her Rolator as a wheelchair. She told me she was having more pain. If gabapentin isn't helping I really feel she needs a more through evaluation. I do not feel this would change acutely in setting of PD unless she has some sort of infectious process going on"   she said ok, appreciated call back.

## 2023-02-16 DIAGNOSIS — Z7182 Exercise counseling: Secondary | ICD-10-CM | POA: Diagnosis not present

## 2023-02-16 DIAGNOSIS — I129 Hypertensive chronic kidney disease with stage 1 through stage 4 chronic kidney disease, or unspecified chronic kidney disease: Secondary | ICD-10-CM | POA: Diagnosis not present

## 2023-02-16 DIAGNOSIS — E1122 Type 2 diabetes mellitus with diabetic chronic kidney disease: Secondary | ICD-10-CM | POA: Diagnosis not present

## 2023-02-16 DIAGNOSIS — Z713 Dietary counseling and surveillance: Secondary | ICD-10-CM | POA: Diagnosis not present

## 2023-02-16 DIAGNOSIS — R946 Abnormal results of thyroid function studies: Secondary | ICD-10-CM | POA: Diagnosis not present

## 2023-02-16 DIAGNOSIS — N390 Urinary tract infection, site not specified: Secondary | ICD-10-CM | POA: Diagnosis not present

## 2023-02-16 DIAGNOSIS — G20A1 Parkinson's disease without dyskinesia, without mention of fluctuations: Secondary | ICD-10-CM | POA: Diagnosis not present

## 2023-02-16 DIAGNOSIS — R1310 Dysphagia, unspecified: Secondary | ICD-10-CM | POA: Diagnosis not present

## 2023-02-16 DIAGNOSIS — G629 Polyneuropathy, unspecified: Secondary | ICD-10-CM | POA: Diagnosis not present

## 2023-02-16 DIAGNOSIS — N189 Chronic kidney disease, unspecified: Secondary | ICD-10-CM | POA: Diagnosis not present

## 2023-02-16 DIAGNOSIS — E782 Mixed hyperlipidemia: Secondary | ICD-10-CM | POA: Diagnosis not present

## 2023-02-16 DIAGNOSIS — R350 Frequency of micturition: Secondary | ICD-10-CM | POA: Diagnosis not present

## 2023-02-21 ENCOUNTER — Other Ambulatory Visit (HOSPITAL_COMMUNITY): Payer: Self-pay | Admitting: Occupational Therapy

## 2023-02-21 DIAGNOSIS — R1312 Dysphagia, oropharyngeal phase: Secondary | ICD-10-CM

## 2023-02-21 DIAGNOSIS — G20A1 Parkinson's disease without dyskinesia, without mention of fluctuations: Secondary | ICD-10-CM

## 2023-03-04 DIAGNOSIS — D631 Anemia in chronic kidney disease: Secondary | ICD-10-CM | POA: Diagnosis not present

## 2023-03-04 DIAGNOSIS — M1710 Unilateral primary osteoarthritis, unspecified knee: Secondary | ICD-10-CM | POA: Diagnosis not present

## 2023-03-04 DIAGNOSIS — I129 Hypertensive chronic kidney disease with stage 1 through stage 4 chronic kidney disease, or unspecified chronic kidney disease: Secondary | ICD-10-CM | POA: Diagnosis not present

## 2023-03-04 DIAGNOSIS — E1122 Type 2 diabetes mellitus with diabetic chronic kidney disease: Secondary | ICD-10-CM | POA: Diagnosis not present

## 2023-03-04 DIAGNOSIS — D509 Iron deficiency anemia, unspecified: Secondary | ICD-10-CM | POA: Diagnosis not present

## 2023-03-04 DIAGNOSIS — G20A1 Parkinson's disease without dyskinesia, without mention of fluctuations: Secondary | ICD-10-CM | POA: Diagnosis not present

## 2023-03-04 DIAGNOSIS — E1142 Type 2 diabetes mellitus with diabetic polyneuropathy: Secondary | ICD-10-CM | POA: Diagnosis not present

## 2023-03-04 DIAGNOSIS — M109 Gout, unspecified: Secondary | ICD-10-CM | POA: Diagnosis not present

## 2023-03-04 DIAGNOSIS — N1831 Chronic kidney disease, stage 3a: Secondary | ICD-10-CM | POA: Diagnosis not present

## 2023-03-08 DIAGNOSIS — E1142 Type 2 diabetes mellitus with diabetic polyneuropathy: Secondary | ICD-10-CM | POA: Diagnosis not present

## 2023-03-08 DIAGNOSIS — N1831 Chronic kidney disease, stage 3a: Secondary | ICD-10-CM | POA: Diagnosis not present

## 2023-03-08 DIAGNOSIS — G20A1 Parkinson's disease without dyskinesia, without mention of fluctuations: Secondary | ICD-10-CM | POA: Diagnosis not present

## 2023-03-08 DIAGNOSIS — D509 Iron deficiency anemia, unspecified: Secondary | ICD-10-CM | POA: Diagnosis not present

## 2023-03-08 DIAGNOSIS — D631 Anemia in chronic kidney disease: Secondary | ICD-10-CM | POA: Diagnosis not present

## 2023-03-08 DIAGNOSIS — M1710 Unilateral primary osteoarthritis, unspecified knee: Secondary | ICD-10-CM | POA: Diagnosis not present

## 2023-03-08 DIAGNOSIS — M109 Gout, unspecified: Secondary | ICD-10-CM | POA: Diagnosis not present

## 2023-03-08 DIAGNOSIS — E1122 Type 2 diabetes mellitus with diabetic chronic kidney disease: Secondary | ICD-10-CM | POA: Diagnosis not present

## 2023-03-08 DIAGNOSIS — I129 Hypertensive chronic kidney disease with stage 1 through stage 4 chronic kidney disease, or unspecified chronic kidney disease: Secondary | ICD-10-CM | POA: Diagnosis not present

## 2023-03-10 DIAGNOSIS — E1142 Type 2 diabetes mellitus with diabetic polyneuropathy: Secondary | ICD-10-CM | POA: Diagnosis not present

## 2023-03-10 DIAGNOSIS — I129 Hypertensive chronic kidney disease with stage 1 through stage 4 chronic kidney disease, or unspecified chronic kidney disease: Secondary | ICD-10-CM | POA: Diagnosis not present

## 2023-03-10 DIAGNOSIS — N1831 Chronic kidney disease, stage 3a: Secondary | ICD-10-CM | POA: Diagnosis not present

## 2023-03-10 DIAGNOSIS — E1122 Type 2 diabetes mellitus with diabetic chronic kidney disease: Secondary | ICD-10-CM | POA: Diagnosis not present

## 2023-03-10 DIAGNOSIS — D509 Iron deficiency anemia, unspecified: Secondary | ICD-10-CM | POA: Diagnosis not present

## 2023-03-10 DIAGNOSIS — D631 Anemia in chronic kidney disease: Secondary | ICD-10-CM | POA: Diagnosis not present

## 2023-03-10 DIAGNOSIS — M1710 Unilateral primary osteoarthritis, unspecified knee: Secondary | ICD-10-CM | POA: Diagnosis not present

## 2023-03-10 DIAGNOSIS — G20A1 Parkinson's disease without dyskinesia, without mention of fluctuations: Secondary | ICD-10-CM | POA: Diagnosis not present

## 2023-03-10 DIAGNOSIS — M109 Gout, unspecified: Secondary | ICD-10-CM | POA: Diagnosis not present

## 2023-03-15 DIAGNOSIS — M1710 Unilateral primary osteoarthritis, unspecified knee: Secondary | ICD-10-CM | POA: Diagnosis not present

## 2023-03-15 DIAGNOSIS — N1831 Chronic kidney disease, stage 3a: Secondary | ICD-10-CM | POA: Diagnosis not present

## 2023-03-15 DIAGNOSIS — D509 Iron deficiency anemia, unspecified: Secondary | ICD-10-CM | POA: Diagnosis not present

## 2023-03-15 DIAGNOSIS — D631 Anemia in chronic kidney disease: Secondary | ICD-10-CM | POA: Diagnosis not present

## 2023-03-15 DIAGNOSIS — E1122 Type 2 diabetes mellitus with diabetic chronic kidney disease: Secondary | ICD-10-CM | POA: Diagnosis not present

## 2023-03-15 DIAGNOSIS — I129 Hypertensive chronic kidney disease with stage 1 through stage 4 chronic kidney disease, or unspecified chronic kidney disease: Secondary | ICD-10-CM | POA: Diagnosis not present

## 2023-03-15 DIAGNOSIS — G20A1 Parkinson's disease without dyskinesia, without mention of fluctuations: Secondary | ICD-10-CM | POA: Diagnosis not present

## 2023-03-15 DIAGNOSIS — E1142 Type 2 diabetes mellitus with diabetic polyneuropathy: Secondary | ICD-10-CM | POA: Diagnosis not present

## 2023-03-15 DIAGNOSIS — M109 Gout, unspecified: Secondary | ICD-10-CM | POA: Diagnosis not present

## 2023-03-17 DIAGNOSIS — M109 Gout, unspecified: Secondary | ICD-10-CM | POA: Diagnosis not present

## 2023-03-17 DIAGNOSIS — M1710 Unilateral primary osteoarthritis, unspecified knee: Secondary | ICD-10-CM | POA: Diagnosis not present

## 2023-03-17 DIAGNOSIS — E1122 Type 2 diabetes mellitus with diabetic chronic kidney disease: Secondary | ICD-10-CM | POA: Diagnosis not present

## 2023-03-17 DIAGNOSIS — D509 Iron deficiency anemia, unspecified: Secondary | ICD-10-CM | POA: Diagnosis not present

## 2023-03-17 DIAGNOSIS — D631 Anemia in chronic kidney disease: Secondary | ICD-10-CM | POA: Diagnosis not present

## 2023-03-17 DIAGNOSIS — N1831 Chronic kidney disease, stage 3a: Secondary | ICD-10-CM | POA: Diagnosis not present

## 2023-03-17 DIAGNOSIS — E1142 Type 2 diabetes mellitus with diabetic polyneuropathy: Secondary | ICD-10-CM | POA: Diagnosis not present

## 2023-03-17 DIAGNOSIS — I129 Hypertensive chronic kidney disease with stage 1 through stage 4 chronic kidney disease, or unspecified chronic kidney disease: Secondary | ICD-10-CM | POA: Diagnosis not present

## 2023-03-17 DIAGNOSIS — G20A1 Parkinson's disease without dyskinesia, without mention of fluctuations: Secondary | ICD-10-CM | POA: Diagnosis not present

## 2023-03-22 DIAGNOSIS — M1710 Unilateral primary osteoarthritis, unspecified knee: Secondary | ICD-10-CM | POA: Diagnosis not present

## 2023-03-22 DIAGNOSIS — N1831 Chronic kidney disease, stage 3a: Secondary | ICD-10-CM | POA: Diagnosis not present

## 2023-03-22 DIAGNOSIS — D509 Iron deficiency anemia, unspecified: Secondary | ICD-10-CM | POA: Diagnosis not present

## 2023-03-22 DIAGNOSIS — D631 Anemia in chronic kidney disease: Secondary | ICD-10-CM | POA: Diagnosis not present

## 2023-03-22 DIAGNOSIS — M109 Gout, unspecified: Secondary | ICD-10-CM | POA: Diagnosis not present

## 2023-03-22 DIAGNOSIS — E1142 Type 2 diabetes mellitus with diabetic polyneuropathy: Secondary | ICD-10-CM | POA: Diagnosis not present

## 2023-03-22 DIAGNOSIS — G20A1 Parkinson's disease without dyskinesia, without mention of fluctuations: Secondary | ICD-10-CM | POA: Diagnosis not present

## 2023-03-22 DIAGNOSIS — E1122 Type 2 diabetes mellitus with diabetic chronic kidney disease: Secondary | ICD-10-CM | POA: Diagnosis not present

## 2023-03-22 DIAGNOSIS — I129 Hypertensive chronic kidney disease with stage 1 through stage 4 chronic kidney disease, or unspecified chronic kidney disease: Secondary | ICD-10-CM | POA: Diagnosis not present

## 2023-03-24 DIAGNOSIS — N1831 Chronic kidney disease, stage 3a: Secondary | ICD-10-CM | POA: Diagnosis not present

## 2023-03-24 DIAGNOSIS — D509 Iron deficiency anemia, unspecified: Secondary | ICD-10-CM | POA: Diagnosis not present

## 2023-03-24 DIAGNOSIS — I129 Hypertensive chronic kidney disease with stage 1 through stage 4 chronic kidney disease, or unspecified chronic kidney disease: Secondary | ICD-10-CM | POA: Diagnosis not present

## 2023-03-24 DIAGNOSIS — E1122 Type 2 diabetes mellitus with diabetic chronic kidney disease: Secondary | ICD-10-CM | POA: Diagnosis not present

## 2023-03-24 DIAGNOSIS — E1142 Type 2 diabetes mellitus with diabetic polyneuropathy: Secondary | ICD-10-CM | POA: Diagnosis not present

## 2023-03-24 DIAGNOSIS — G20A1 Parkinson's disease without dyskinesia, without mention of fluctuations: Secondary | ICD-10-CM | POA: Diagnosis not present

## 2023-03-24 DIAGNOSIS — M109 Gout, unspecified: Secondary | ICD-10-CM | POA: Diagnosis not present

## 2023-03-24 DIAGNOSIS — M1710 Unilateral primary osteoarthritis, unspecified knee: Secondary | ICD-10-CM | POA: Diagnosis not present

## 2023-03-24 DIAGNOSIS — D631 Anemia in chronic kidney disease: Secondary | ICD-10-CM | POA: Diagnosis not present

## 2023-03-30 DIAGNOSIS — G20A1 Parkinson's disease without dyskinesia, without mention of fluctuations: Secondary | ICD-10-CM | POA: Diagnosis not present

## 2023-03-30 DIAGNOSIS — I129 Hypertensive chronic kidney disease with stage 1 through stage 4 chronic kidney disease, or unspecified chronic kidney disease: Secondary | ICD-10-CM | POA: Diagnosis not present

## 2023-03-30 DIAGNOSIS — E782 Mixed hyperlipidemia: Secondary | ICD-10-CM | POA: Diagnosis not present

## 2023-03-30 DIAGNOSIS — Z23 Encounter for immunization: Secondary | ICD-10-CM | POA: Diagnosis not present

## 2023-03-30 DIAGNOSIS — E1122 Type 2 diabetes mellitus with diabetic chronic kidney disease: Secondary | ICD-10-CM | POA: Diagnosis not present

## 2023-03-30 DIAGNOSIS — R1310 Dysphagia, unspecified: Secondary | ICD-10-CM | POA: Diagnosis not present

## 2023-03-30 DIAGNOSIS — M1711 Unilateral primary osteoarthritis, right knee: Secondary | ICD-10-CM | POA: Diagnosis not present

## 2023-03-30 DIAGNOSIS — G629 Polyneuropathy, unspecified: Secondary | ICD-10-CM | POA: Diagnosis not present

## 2023-03-30 DIAGNOSIS — M109 Gout, unspecified: Secondary | ICD-10-CM | POA: Diagnosis not present

## 2023-03-31 DIAGNOSIS — D509 Iron deficiency anemia, unspecified: Secondary | ICD-10-CM | POA: Diagnosis not present

## 2023-03-31 DIAGNOSIS — D631 Anemia in chronic kidney disease: Secondary | ICD-10-CM | POA: Diagnosis not present

## 2023-03-31 DIAGNOSIS — E1122 Type 2 diabetes mellitus with diabetic chronic kidney disease: Secondary | ICD-10-CM | POA: Diagnosis not present

## 2023-03-31 DIAGNOSIS — I129 Hypertensive chronic kidney disease with stage 1 through stage 4 chronic kidney disease, or unspecified chronic kidney disease: Secondary | ICD-10-CM | POA: Diagnosis not present

## 2023-03-31 DIAGNOSIS — M109 Gout, unspecified: Secondary | ICD-10-CM | POA: Diagnosis not present

## 2023-03-31 DIAGNOSIS — N1831 Chronic kidney disease, stage 3a: Secondary | ICD-10-CM | POA: Diagnosis not present

## 2023-03-31 DIAGNOSIS — M1710 Unilateral primary osteoarthritis, unspecified knee: Secondary | ICD-10-CM | POA: Diagnosis not present

## 2023-03-31 DIAGNOSIS — G20A1 Parkinson's disease without dyskinesia, without mention of fluctuations: Secondary | ICD-10-CM | POA: Diagnosis not present

## 2023-03-31 DIAGNOSIS — E1142 Type 2 diabetes mellitus with diabetic polyneuropathy: Secondary | ICD-10-CM | POA: Diagnosis not present

## 2023-04-01 DIAGNOSIS — N390 Urinary tract infection, site not specified: Secondary | ICD-10-CM | POA: Diagnosis not present

## 2023-04-04 DIAGNOSIS — E1142 Type 2 diabetes mellitus with diabetic polyneuropathy: Secondary | ICD-10-CM | POA: Diagnosis not present

## 2023-04-04 DIAGNOSIS — M1710 Unilateral primary osteoarthritis, unspecified knee: Secondary | ICD-10-CM | POA: Diagnosis not present

## 2023-04-04 DIAGNOSIS — D509 Iron deficiency anemia, unspecified: Secondary | ICD-10-CM | POA: Diagnosis not present

## 2023-04-04 DIAGNOSIS — G20A1 Parkinson's disease without dyskinesia, without mention of fluctuations: Secondary | ICD-10-CM | POA: Diagnosis not present

## 2023-04-04 DIAGNOSIS — N1831 Chronic kidney disease, stage 3a: Secondary | ICD-10-CM | POA: Diagnosis not present

## 2023-04-04 DIAGNOSIS — E1122 Type 2 diabetes mellitus with diabetic chronic kidney disease: Secondary | ICD-10-CM | POA: Diagnosis not present

## 2023-04-04 DIAGNOSIS — I129 Hypertensive chronic kidney disease with stage 1 through stage 4 chronic kidney disease, or unspecified chronic kidney disease: Secondary | ICD-10-CM | POA: Diagnosis not present

## 2023-04-04 DIAGNOSIS — D631 Anemia in chronic kidney disease: Secondary | ICD-10-CM | POA: Diagnosis not present

## 2023-04-04 DIAGNOSIS — M109 Gout, unspecified: Secondary | ICD-10-CM | POA: Diagnosis not present

## 2023-04-05 DIAGNOSIS — E1142 Type 2 diabetes mellitus with diabetic polyneuropathy: Secondary | ICD-10-CM | POA: Diagnosis not present

## 2023-04-05 DIAGNOSIS — N1831 Chronic kidney disease, stage 3a: Secondary | ICD-10-CM | POA: Diagnosis not present

## 2023-04-05 DIAGNOSIS — G20A1 Parkinson's disease without dyskinesia, without mention of fluctuations: Secondary | ICD-10-CM | POA: Diagnosis not present

## 2023-04-05 DIAGNOSIS — D509 Iron deficiency anemia, unspecified: Secondary | ICD-10-CM | POA: Diagnosis not present

## 2023-04-05 DIAGNOSIS — M1710 Unilateral primary osteoarthritis, unspecified knee: Secondary | ICD-10-CM | POA: Diagnosis not present

## 2023-04-05 DIAGNOSIS — M109 Gout, unspecified: Secondary | ICD-10-CM | POA: Diagnosis not present

## 2023-04-05 DIAGNOSIS — D631 Anemia in chronic kidney disease: Secondary | ICD-10-CM | POA: Diagnosis not present

## 2023-04-05 DIAGNOSIS — I129 Hypertensive chronic kidney disease with stage 1 through stage 4 chronic kidney disease, or unspecified chronic kidney disease: Secondary | ICD-10-CM | POA: Diagnosis not present

## 2023-04-05 DIAGNOSIS — E1122 Type 2 diabetes mellitus with diabetic chronic kidney disease: Secondary | ICD-10-CM | POA: Diagnosis not present

## 2023-04-06 ENCOUNTER — Ambulatory Visit (HOSPITAL_COMMUNITY)
Admission: RE | Admit: 2023-04-06 | Discharge: 2023-04-06 | Disposition: A | Discharge status: Home / Self Care | Payer: Medicare HMO | Source: Ambulatory Visit | Attending: Family Medicine | Admitting: Family Medicine

## 2023-04-06 ENCOUNTER — Encounter (HOSPITAL_COMMUNITY): Payer: Self-pay | Admitting: Speech Pathology

## 2023-04-06 ENCOUNTER — Ambulatory Visit (HOSPITAL_COMMUNITY): Payer: Medicare HMO | Attending: Internal Medicine | Admitting: Speech Pathology

## 2023-04-06 DIAGNOSIS — R1312 Dysphagia, oropharyngeal phase: Secondary | ICD-10-CM | POA: Diagnosis not present

## 2023-04-06 DIAGNOSIS — G20A1 Parkinson's disease without dyskinesia, without mention of fluctuations: Secondary | ICD-10-CM | POA: Diagnosis not present

## 2023-04-06 NOTE — Therapy (Signed)
[facility] at [facility] [address] [place], [place], [address] Phone: [phone]   Fax:  [phone]  Modified Barium Swallow  Patient Details  Name: [name] MRN: [id number] Date of Birth: 24-Oct-1940 No data recorded  Encounter Date: 04/06/2023   End of Session - 04/06/23 1639     Visit Number 1    Number of Visits 1    Activity Tolerance Patient tolerated treatment well             HPI/PMH: HPI: [name] is an 82 y/o female who was referred by Shawnie Dapper, NP (neurology) for MBSS in setting of Parkinson's disease. Pt has reported more trouble swallowing. She is taking medications with applesauce and increased coughing with liquids.   Clinical Impression: Clinical Impression: Pt's oropharyngeal swallowing is within functional limits on today's assessment. Overall Pt does have a slightly slow/prolonged oral stage but the swallowing trigger is timely. One slightly delayed swallowing trigger (posterior laryngeal surface of the epiglottis) with thin via straw resulted in flash penetration followed by an immediate reflexive cough. Note good hyolaryngeal excursion and adequate airway protection with all consistencies and textures presented. Attempted the barium tablet with thin liquids and Pt was unable to pass tablet off of the lingual surface but with one presentation of puree Pt swallowed the tablet without incident. Esophageal sweep was unremarkable. Recommend continue with regular/thin and continue to adminsiter meds with puree. Reviewed findings with Pt and Pt's daughter at length and recommend consider not using straws when Pt is tired or if they notice more consistent coughing episodes with use of straws. There is no further ST indicated at this time. Thank you for this referral,  Factors that may increase risk of adverse event in presence of aspiration Rubye Oaks & Clearance Coots 2021): No data  recorded  Recommendations/Plan: Swallowing Evaluation Recommendations Swallowing Evaluation Recommendations Recommendations: PO diet PO Diet Recommendation: Regular; Thin liquids (Level 0) Liquid Administration via: Cup; Straw Medication Administration: Crushed with puree Supervision: Patient able to self-feed    Treatment Plan No data recorded   Recommendations Recommendations for follow up therapy are one component of a multi-disciplinary discharge planning process, led by the attending physician.  Recommendations may be updated based on patient status, additional functional criteria and insurance authorization.  Assessment: Orofacial Exam: No data recorded  Anatomy:  No data recorded  Boluses Administered: Boluses Administered Boluses Administered: Thin liquids (Level 0); Mildly thick liquids (Level 2, nectar thick); Moderately thick liquids (Level 3, honey thick); Puree; Solid     Oral Impairment Domain: Oral Impairment Domain Lip Closure: No labial escape Tongue control during bolus hold: Cohesive bolus between tongue to palatal seal Bolus preparation/mastication: Slow prolonged chewing/mashing with complete recollection Bolus transport/lingual motion: Delayed initiation of tongue motion (oral holding) Oral residue: Trace residue lining oral structures Location of oral residue : Tongue Initiation of pharyngeal swallow : Posterior angle of the ramus; Posterior laryngeal surface of the epiglottis     Pharyngeal Impairment Domain: Pharyngeal Impairment Domain Soft palate elevation: No bolus between soft palate (SP)/pharyngeal wall (PW) Laryngeal elevation: Partial superior movement of thyroid cartilage/partial approximation of arytenoids to epiglottic petiole Anterior hyoid excursion: Complete anterior movement Epiglottic movement: Complete inversion Laryngeal vestibule closure: Incomplete, narrow column air/contrast in laryngeal vestibule; Complete, no  air/contrast in laryngeal vestibule Pharyngeal stripping wave : Present - complete Pharyngoesophageal segment opening: Complete distension and complete duration, no obstruction of flow Tongue base retraction: No contrast between tongue base and posterior pharyngeal wall (PPW)  Pharyngeal residue: Complete pharyngeal clearance Location of pharyngeal residue: Tongue base     Esophageal Impairment Domain: Esophageal Impairment Domain Esophageal clearance upright position: Complete clearance, esophageal coating    Pill: Pill Consistency administered: Thin liquids (Level 0); Puree Thin liquids (Level 0): WFL Puree: WFL    Penetration/Aspiration Scale Score: Penetration/Aspiration Scale Score 1.  Material does not enter airway: Mildly thick liquids (Level 2, nectar thick); Moderately thick liquids (Level 3, honey thick); Puree; Solid; Pill 2.  Material enters airway, remains ABOVE vocal cords then ejected out: Thin liquids (Level 0)    Compensatory Strategies: No data recorded     General Information: Caregiver present: Yes   Diet Prior to this Study: Regular; Thin liquids (Level 0)    Temperature : Normal    Respiratory Status: WFL    No data recorded   No data recorded  No data recorded Self-Feeding Abilities: Able to self-feed  Baseline vocal quality/speech: Hypophonia/low volume  Volitional Cough: Able to elicit  Volitional Swallow: Able to elicit  No data recorded  Goal Planning: No data recorded No data recorded No data recorded No data recorded No data recorded  Pain: No data recorded  End of Session: Start Time:No data recorded Stop Time: No data recorded Time Calculation:No data recorded Charges: No data recorded SLP visit diagnosis: SLP Visit Diagnosis: Dysphagia, oropharyngeal phase (R13.12)    Past Medical History:  Past Medical History:  Diagnosis Date   Aortic atherosclerosis (HCC) 09/11/2016   noted on CXR   Arthritis    Baker's  cyst, left 2014   Small, Left   Bilateral leg numbness    Cataract    Bilateral   Diabetes mellitus without complication (HCC)    Diverticulosis 10/09/2016   Noted on CT abd/pelvis   Gout    Grade I diastolic dysfunction 09/12/2016   Noted on ECHO   Hepatic cyst 10/09/2016   Noted on CT abd/pelvis   History of iron deficiency anemia    History of migraine    Hypercholesteremia    Hypertension    Internal hemorrhoids    Lateral meniscal tear 2014   Left   LVH (left ventricular hypertrophy) 03/03/2018   noted on EKG   Obesity    PMB (postmenopausal bleeding)    Pneumonia 2018   Right upper lower   PONV (postoperative nausea and vomiting)    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   Right 1.3 cm, Noted on CT Chest, pt unaware   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 Romie Levee, CCC-SLP Speech Language Pathologist   Georgetta Haber, CCC-SLP 04/06/2023, 4:40 PM   [facility] at [facility] [address] [place], [place], [address] Phone: [phone]   Fax:  [phone]  Name: GABRIANNA BEARS MRN: [id number] Date of Birth: February 14, 1941

## 2023-04-07 DIAGNOSIS — M1710 Unilateral primary osteoarthritis, unspecified knee: Secondary | ICD-10-CM | POA: Diagnosis not present

## 2023-04-07 DIAGNOSIS — G20A1 Parkinson's disease without dyskinesia, without mention of fluctuations: Secondary | ICD-10-CM | POA: Diagnosis not present

## 2023-04-07 DIAGNOSIS — D631 Anemia in chronic kidney disease: Secondary | ICD-10-CM | POA: Diagnosis not present

## 2023-04-07 DIAGNOSIS — D509 Iron deficiency anemia, unspecified: Secondary | ICD-10-CM | POA: Diagnosis not present

## 2023-04-07 DIAGNOSIS — M109 Gout, unspecified: Secondary | ICD-10-CM | POA: Diagnosis not present

## 2023-04-07 DIAGNOSIS — N1831 Chronic kidney disease, stage 3a: Secondary | ICD-10-CM | POA: Diagnosis not present

## 2023-04-07 DIAGNOSIS — E1122 Type 2 diabetes mellitus with diabetic chronic kidney disease: Secondary | ICD-10-CM | POA: Diagnosis not present

## 2023-04-07 DIAGNOSIS — I129 Hypertensive chronic kidney disease with stage 1 through stage 4 chronic kidney disease, or unspecified chronic kidney disease: Secondary | ICD-10-CM | POA: Diagnosis not present

## 2023-04-07 DIAGNOSIS — E1142 Type 2 diabetes mellitus with diabetic polyneuropathy: Secondary | ICD-10-CM | POA: Diagnosis not present

## 2023-04-11 DIAGNOSIS — I129 Hypertensive chronic kidney disease with stage 1 through stage 4 chronic kidney disease, or unspecified chronic kidney disease: Secondary | ICD-10-CM | POA: Diagnosis not present

## 2023-04-11 DIAGNOSIS — M1710 Unilateral primary osteoarthritis, unspecified knee: Secondary | ICD-10-CM | POA: Diagnosis not present

## 2023-04-11 DIAGNOSIS — M109 Gout, unspecified: Secondary | ICD-10-CM | POA: Diagnosis not present

## 2023-04-11 DIAGNOSIS — G20A1 Parkinson's disease without dyskinesia, without mention of fluctuations: Secondary | ICD-10-CM | POA: Diagnosis not present

## 2023-04-11 DIAGNOSIS — D509 Iron deficiency anemia, unspecified: Secondary | ICD-10-CM | POA: Diagnosis not present

## 2023-04-11 DIAGNOSIS — N1831 Chronic kidney disease, stage 3a: Secondary | ICD-10-CM | POA: Diagnosis not present

## 2023-04-11 DIAGNOSIS — D631 Anemia in chronic kidney disease: Secondary | ICD-10-CM | POA: Diagnosis not present

## 2023-04-11 DIAGNOSIS — E1142 Type 2 diabetes mellitus with diabetic polyneuropathy: Secondary | ICD-10-CM | POA: Diagnosis not present

## 2023-04-11 DIAGNOSIS — E1122 Type 2 diabetes mellitus with diabetic chronic kidney disease: Secondary | ICD-10-CM | POA: Diagnosis not present

## 2023-04-12 DIAGNOSIS — D631 Anemia in chronic kidney disease: Secondary | ICD-10-CM | POA: Diagnosis not present

## 2023-04-12 DIAGNOSIS — E1122 Type 2 diabetes mellitus with diabetic chronic kidney disease: Secondary | ICD-10-CM | POA: Diagnosis not present

## 2023-04-12 DIAGNOSIS — E1142 Type 2 diabetes mellitus with diabetic polyneuropathy: Secondary | ICD-10-CM | POA: Diagnosis not present

## 2023-04-12 DIAGNOSIS — N1831 Chronic kidney disease, stage 3a: Secondary | ICD-10-CM | POA: Diagnosis not present

## 2023-04-12 DIAGNOSIS — G20A1 Parkinson's disease without dyskinesia, without mention of fluctuations: Secondary | ICD-10-CM | POA: Diagnosis not present

## 2023-04-12 DIAGNOSIS — M109 Gout, unspecified: Secondary | ICD-10-CM | POA: Diagnosis not present

## 2023-04-12 DIAGNOSIS — I129 Hypertensive chronic kidney disease with stage 1 through stage 4 chronic kidney disease, or unspecified chronic kidney disease: Secondary | ICD-10-CM | POA: Diagnosis not present

## 2023-04-12 DIAGNOSIS — M1710 Unilateral primary osteoarthritis, unspecified knee: Secondary | ICD-10-CM | POA: Diagnosis not present

## 2023-04-12 DIAGNOSIS — D509 Iron deficiency anemia, unspecified: Secondary | ICD-10-CM | POA: Diagnosis not present

## 2023-04-18 DIAGNOSIS — N1831 Chronic kidney disease, stage 3a: Secondary | ICD-10-CM | POA: Diagnosis not present

## 2023-04-18 DIAGNOSIS — D509 Iron deficiency anemia, unspecified: Secondary | ICD-10-CM | POA: Diagnosis not present

## 2023-04-18 DIAGNOSIS — M1710 Unilateral primary osteoarthritis, unspecified knee: Secondary | ICD-10-CM | POA: Diagnosis not present

## 2023-04-18 DIAGNOSIS — E1142 Type 2 diabetes mellitus with diabetic polyneuropathy: Secondary | ICD-10-CM | POA: Diagnosis not present

## 2023-04-18 DIAGNOSIS — D631 Anemia in chronic kidney disease: Secondary | ICD-10-CM | POA: Diagnosis not present

## 2023-04-18 DIAGNOSIS — M109 Gout, unspecified: Secondary | ICD-10-CM | POA: Diagnosis not present

## 2023-04-18 DIAGNOSIS — G20A1 Parkinson's disease without dyskinesia, without mention of fluctuations: Secondary | ICD-10-CM | POA: Diagnosis not present

## 2023-04-18 DIAGNOSIS — I129 Hypertensive chronic kidney disease with stage 1 through stage 4 chronic kidney disease, or unspecified chronic kidney disease: Secondary | ICD-10-CM | POA: Diagnosis not present

## 2023-04-18 DIAGNOSIS — E1122 Type 2 diabetes mellitus with diabetic chronic kidney disease: Secondary | ICD-10-CM | POA: Diagnosis not present

## 2023-04-26 DIAGNOSIS — N1831 Chronic kidney disease, stage 3a: Secondary | ICD-10-CM | POA: Diagnosis not present

## 2023-04-26 DIAGNOSIS — G20A1 Parkinson's disease without dyskinesia, without mention of fluctuations: Secondary | ICD-10-CM | POA: Diagnosis not present

## 2023-04-26 DIAGNOSIS — D631 Anemia in chronic kidney disease: Secondary | ICD-10-CM | POA: Diagnosis not present

## 2023-04-26 DIAGNOSIS — M109 Gout, unspecified: Secondary | ICD-10-CM | POA: Diagnosis not present

## 2023-04-26 DIAGNOSIS — E1142 Type 2 diabetes mellitus with diabetic polyneuropathy: Secondary | ICD-10-CM | POA: Diagnosis not present

## 2023-04-26 DIAGNOSIS — I129 Hypertensive chronic kidney disease with stage 1 through stage 4 chronic kidney disease, or unspecified chronic kidney disease: Secondary | ICD-10-CM | POA: Diagnosis not present

## 2023-04-26 DIAGNOSIS — M1710 Unilateral primary osteoarthritis, unspecified knee: Secondary | ICD-10-CM | POA: Diagnosis not present

## 2023-04-26 DIAGNOSIS — D509 Iron deficiency anemia, unspecified: Secondary | ICD-10-CM | POA: Diagnosis not present

## 2023-04-26 DIAGNOSIS — E1122 Type 2 diabetes mellitus with diabetic chronic kidney disease: Secondary | ICD-10-CM | POA: Diagnosis not present

## 2023-05-04 DIAGNOSIS — M6281 Muscle weakness (generalized): Secondary | ICD-10-CM | POA: Diagnosis not present

## 2023-05-15 ENCOUNTER — Other Ambulatory Visit: Payer: Self-pay

## 2023-05-15 ENCOUNTER — Emergency Department (HOSPITAL_COMMUNITY)
Admission: EM | Admit: 2023-05-15 | Discharge: 2023-05-15 | Disposition: A | Discharge status: Home / Self Care | Payer: Medicare HMO | Attending: Emergency Medicine | Admitting: Emergency Medicine

## 2023-05-15 ENCOUNTER — Emergency Department (HOSPITAL_COMMUNITY): Payer: Medicare HMO

## 2023-05-15 DIAGNOSIS — Z79899 Other long term (current) drug therapy: Secondary | ICD-10-CM | POA: Insufficient documentation

## 2023-05-15 DIAGNOSIS — I10 Essential (primary) hypertension: Secondary | ICD-10-CM | POA: Diagnosis not present

## 2023-05-15 DIAGNOSIS — R4182 Altered mental status, unspecified: Secondary | ICD-10-CM | POA: Diagnosis not present

## 2023-05-15 DIAGNOSIS — E860 Dehydration: Secondary | ICD-10-CM | POA: Insufficient documentation

## 2023-05-15 DIAGNOSIS — N3001 Acute cystitis with hematuria: Secondary | ICD-10-CM | POA: Diagnosis not present

## 2023-05-15 DIAGNOSIS — R29818 Other symptoms and signs involving the nervous system: Secondary | ICD-10-CM | POA: Diagnosis not present

## 2023-05-15 DIAGNOSIS — G20C Parkinsonism, unspecified: Secondary | ICD-10-CM | POA: Diagnosis not present

## 2023-05-15 DIAGNOSIS — I6782 Cerebral ischemia: Secondary | ICD-10-CM | POA: Diagnosis not present

## 2023-05-15 DIAGNOSIS — Z7984 Long term (current) use of oral hypoglycemic drugs: Secondary | ICD-10-CM | POA: Diagnosis not present

## 2023-05-15 DIAGNOSIS — R0989 Other specified symptoms and signs involving the circulatory and respiratory systems: Secondary | ICD-10-CM | POA: Diagnosis not present

## 2023-05-15 DIAGNOSIS — R531 Weakness: Secondary | ICD-10-CM

## 2023-05-15 DIAGNOSIS — R4781 Slurred speech: Secondary | ICD-10-CM | POA: Diagnosis not present

## 2023-05-15 DIAGNOSIS — E119 Type 2 diabetes mellitus without complications: Secondary | ICD-10-CM | POA: Insufficient documentation

## 2023-05-15 LAB — CBC WITH DIFFERENTIAL/PLATELET
Abs Immature Granulocytes: 0.04 10*3/uL (ref 0.00–0.07)
Basophils Absolute: 0 10*3/uL (ref 0.0–0.1)
Basophils Relative: 0 %
Eosinophils Absolute: 0.2 10*3/uL (ref 0.0–0.5)
Eosinophils Relative: 2 %
HCT: 32.1 % — ABNORMAL LOW (ref 36.0–46.0)
Hemoglobin: 10.2 g/dL — ABNORMAL LOW (ref 12.0–15.0)
Immature Granulocytes: 0 %
Lymphocytes Relative: 16 %
Lymphs Abs: 1.5 10*3/uL (ref 0.7–4.0)
MCH: 29.5 pg (ref 26.0–34.0)
MCHC: 31.8 g/dL (ref 30.0–36.0)
MCV: 92.8 fL (ref 80.0–100.0)
Monocytes Absolute: 0.5 10*3/uL (ref 0.1–1.0)
Monocytes Relative: 5 %
Neutro Abs: 7.4 10*3/uL (ref 1.7–7.7)
Neutrophils Relative %: 77 %
Platelets: 169 10*3/uL (ref 150–400)
RBC: 3.46 MIL/uL — ABNORMAL LOW (ref 3.87–5.11)
RDW: 16.9 % — ABNORMAL HIGH (ref 11.5–15.5)
WBC: 9.7 10*3/uL (ref 4.0–10.5)
nRBC: 0 % (ref 0.0–0.2)

## 2023-05-15 LAB — COMPREHENSIVE METABOLIC PANEL
ALT: <5 U/L (ref 0–44)
AST: 13 U/L — ABNORMAL LOW (ref 15–41)
Albumin: 3 g/dL — ABNORMAL LOW (ref 3.5–5.0)
Alkaline Phosphatase: 92 U/L (ref 38–126)
Anion gap: 10 (ref 5–15)
BUN: 30 mg/dL — ABNORMAL HIGH (ref 8–23)
CO2: 29 mmol/L (ref 22–32)
Calcium: 9.1 mg/dL (ref 8.9–10.3)
Chloride: 99 mmol/L (ref 98–111)
Creatinine, Ser: 0.82 mg/dL (ref 0.44–1.00)
GFR, Estimated: >60 mL/min (ref >60)
Glucose, Bld: 90 mg/dL (ref 70–99)
Potassium: 3.8 mmol/L (ref 3.5–5.1)
Sodium: 138 mmol/L (ref 135–145)
Total Bilirubin: 0.4 mg/dL (ref <1.2)
Total Protein: 6.2 g/dL — ABNORMAL LOW (ref 6.5–8.1)

## 2023-05-15 LAB — URINALYSIS, ROUTINE W REFLEX MICROSCOPIC
Bilirubin Urine: NEGATIVE
Glucose, UA: NEGATIVE mg/dL
Ketones, ur: NEGATIVE mg/dL
Nitrite: NEGATIVE
Protein, ur: 30 mg/dL — AB
Specific Gravity, Urine: 1.005 (ref 1.005–1.030)
WBC, UA: >50 WBC/hpf (ref 0–5)
pH: 6 (ref 5.0–8.0)

## 2023-05-15 LAB — CBG MONITORING, ED: Glucose-Capillary: 82 mg/dL (ref 70–99)

## 2023-05-15 MED ORDER — SODIUM CHLORIDE 0.9 % IV BOLUS
500.0000 mL | Freq: Once | INTRAVENOUS | Status: AC
  Administered 2023-05-15: 500 mL via INTRAVENOUS

## 2023-05-15 MED ORDER — CEPHALEXIN 500 MG PO CAPS
500.0000 mg | ORAL_CAPSULE | Freq: Once | ORAL | Status: AC
  Administered 2023-05-15: 500 mg via ORAL
  Filled 2023-05-15: qty 1

## 2023-05-15 MED ORDER — CEPHALEXIN 500 MG PO CAPS
500.0000 mg | ORAL_CAPSULE | Freq: Four times a day (QID) | ORAL | 0 refills | Status: DC
Start: 2023-05-15 — End: 2023-07-17

## 2023-05-15 NOTE — Discharge Instructions (Signed)
Follow-up with your family doctor the end of the week for recheck on this urinary tract infection.  Also he needs to follow-up on the pituitary findings on the MRI and see if he needs to do other studies

## 2023-05-15 NOTE — ED Provider Notes (Signed)
Patient with urinary tract infection and MRI does not show stroke but shows possible pituitary abnormalities.  Patient will follow-up with her family doctor this week for the UTI and pituitary finding on the MRI   Bethann Berkshire, MD 05/15/23 1857

## 2023-05-15 NOTE — ED Provider Notes (Signed)
[place] EMERGENCY DEPARTMENT AT [facility] Provider Note   CSN: [id number] Arrival date & time: 05/15/23  1244     History  No chief complaint on file.   [name] is a 82 y.o. female.  Pt is a 82 yo female with pmhx significant for parkinson's disease, htn, hld, dm, and arthritis.  Pt lives at home and family called EMS due to diffuse weakness.  She's had a decreased appetite.  No fevers.  No pain.  Pt is a poor historian.         Home Medications Prior to Admission medications   Medication Sig Start Date End Date Taking? Authorizing Provider  acetaminophen (TYLENOL) 500 MG tablet Take 500 mg by mouth every 6 (six) hours as needed for moderate pain.     [provider]  allopurinol (ZYLOPRIM) 100 MG tablet Take 100 mg by mouth daily.    [provider]  amLODipine (NORVASC) 5 MG tablet Take 5 mg by mouth daily.     [provider]  carbidopa-levodopa (SINEMET IR) 25-100 MG tablet Take 1 tablet by mouth 4 (four) times daily. At 6 AM, 10 AM, 2 PM and 6 PM 02/09/23   Lomax, Amy, NP  chlorthalidone (HYGROTON) 25 MG tablet Take 12.5 mg by mouth daily.    [provider]  gabapentin (NEURONTIN) 300 MG capsule Take 1 capsule (300 mg total) by mouth 3 (three) times daily. Start with 1 capsule before bed for 5 days, may then add a second capsule at dinner for 5 days, if needed may add a third capsule in the morning for a total of three capsules a day. 02/09/23   Lomax, Amy, NP  metFORMIN (GLUCOPHAGE) 500 MG tablet Take 500 mg by mouth daily with breakfast.    [provider]  olmesartan (BENICAR) 20 MG tablet Take 20 mg by mouth daily.    [provider]  simvastatin (ZOCOR) 40 MG tablet Take 40 mg by mouth every evening.    [provider]      Allergies    Patient has no known allergies.    Review of Systems   Review of Systems  Neurological:  Positive for weakness.  All other systems reviewed and are  negative.   Physical Exam Updated Vital Signs BP (!) 179/88   Pulse 87   Temp 98.5 F (36.9 C) (Oral)   Resp (!) 24   Ht 5\' 2"  (1.575 m)   Wt 68 kg   SpO2 94%   BMI 27.44 kg/m  Physical Exam Vitals and nursing note reviewed.  Constitutional:      Appearance: Normal appearance.  HENT:     Head: Normocephalic and atraumatic.     Right Ear: External ear normal.     Left Ear: External ear normal.     Nose: Nose normal.     Mouth/Throat:     Mouth: Mucous membranes are dry.  Eyes:     Extraocular Movements: Extraocular movements intact.     Conjunctiva/sclera: Conjunctivae normal.     Pupils: Pupils are equal, round, and reactive to light.  Cardiovascular:     Rate and Rhythm: Normal rate and regular rhythm.     Pulses: Normal pulses.     Heart sounds: Normal heart sounds.  Pulmonary:     Effort: Pulmonary effort is normal.     Breath sounds: Normal breath sounds.  Abdominal:     General: Abdomen is flat. Bowel sounds are normal.  Palpations: Abdomen is soft.  Musculoskeletal:        General: Normal range of motion.     Cervical back: Normal range of motion and neck supple.  Skin:    General: Skin is warm.     Capillary Refill: Capillary refill takes less than 2 seconds.  Neurological:     Mental Status: She is alert and oriented to person, place, and time.     Comments: Pt is moving all 4 extremities, but is diffusely weak.    Psychiatric:        Mood and Affect: Affect is flat.     ED Results / Procedures / Treatments   Labs (all labs ordered are listed, but only abnormal results are displayed) Labs Reviewed  CBC WITH DIFFERENTIAL/PLATELET - Abnormal; Notable for the following components:      Result Value   RBC 3.46 (*)    Hemoglobin 10.2 (*)    HCT 32.1 (*)    RDW 16.9 (*)    All other components within normal limits  COMPREHENSIVE METABOLIC PANEL - Abnormal; Notable for the following components:   BUN 30 (*)    Total Protein 6.2 (*)    Albumin  3.0 (*)    AST 13 (*)    All other components within normal limits  URINALYSIS, ROUTINE W REFLEX MICROSCOPIC  CBG MONITORING, ED    EKG EKG Interpretation Date/Time:  Sunday May 15 2023 12:56:09 EST Ventricular Rate:  88 PR Interval:  163 QRS Duration:  93 QT Interval:  374 QTC Calculation: 453 R Axis:   4  Text Interpretation: Sinus rhythm Left ventricular hypertrophy duplicate Confirmed by Jacalyn Lefevre [phone]) on 05/15/2023 1:25:09 PM  Radiology CT Head Wo Contrast  Result Date: 05/15/2023 CLINICAL DATA:  Mental status change. Weakness and decreased appetite. Somewhat slurred speech. EXAM: CT HEAD WITHOUT CONTRAST TECHNIQUE: Contiguous axial images were obtained from the base of the skull through the vertex without intravenous contrast. RADIATION DOSE REDUCTION: This exam was performed according to the departmental dose-optimization program which includes automated exposure control, adjustment of the mA and/or kV according to patient size and/or use of iterative reconstruction technique. COMPARISON:  08/13/2006 FINDINGS: Brain: No evidence of acute infarction, hemorrhage, hydrocephalus, extra-axial collection or mass lesion/mass effect. There is age appropriate ventricular sulcal enlargement. Patchy white matter hypoattenuation is noted consistent with moderate chronic microvascular ischemic change, progressed when compared to the prior head CT. Vascular: No hyperdense vessel or unexpected calcification. Skull: Normal. Negative for fracture or focal lesion. Sinuses/Orbits: Visualized globes and orbits are unremarkable. The visualized sinuses are clear. Other: None. IMPRESSION: 1. No acute intracranial abnormalities. 2. 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 Electronically Signed   By: Amie Portland M.D.   On: 05/15/2023 13:53   DG Chest Portable 1 View  Result Date: 05/15/2023 CLINICAL DATA:  Altered mental  status. Decreased appetite. Weakness. EXAM: PORTABLE CHEST 1 VIEW COMPARISON:  04/22/2018. FINDINGS: Cardiac silhouette is normal in size. No mediastinal or hilar masses. Low lung volumes. Linear opacities are noted at the lung bases consistent with atelectasis. Remainder of the lungs is clear. No convincing pleural effusion or pneumothorax. Skeletal structures are grossly intact. IMPRESSION: No active disease. Electronically Signed   By: Amie Portland M.D.   On: 05/15/2023 13:51    Procedures Procedures    Medications Ordered in ED Medications  sodium chloride 0.9 % bolus 500 mL (500 mLs Intravenous New Bag/Given 05/15/23  72)    ED Course/ Medical Decision Making/ A&P                                 Medical Decision Making Amount and/or Complexity of Data Reviewed Labs: ordered. Radiology: ordered.   This patient presents to the ED for concern of ams, this involves an extensive number of treatment options, and is a complaint that carries with it a high risk of complications and morbidity.  The differential diagnosis includes infection, electrolyte abn, cva,    Co morbidities that complicate the patient evaluation  parkinson's disease, htn, hld, dm, and arthritis   Additional history obtained:  Additional history obtained from epic chart review External records from outside source obtained and reviewed including EMS report   Lab Tests:  I Ordered, and personally interpreted labs.  The pertinent results include:  cbc with hgb 10.2 (stable), cmp nl other than bun sl elevated at 30   Imaging Studies ordered:  I ordered imaging studies including cxr, ct head, mri brain  I independently visualized and interpreted imaging which showed  CXR: No active disease.  CT head:  No acute intracranial abnormalities.  2. Moderate chronic microvascular ischemic change of the white  matter, progressed when compared to the prior head CT.  MRI brain:  pending at shift change I agree  with the radiologist interpretation   Cardiac Monitoring:  The patient was maintained on a cardiac monitor.  I personally viewed and interpreted the cardiac monitored which showed an underlying rhythm of: nsr   Medicines ordered and prescription drug management:  I ordered medication including ivfs  for weakness  Reevaluation of the patient after these medicines showed that the patient improved I have reviewed the patients home medicines and have made adjustments as needed   Test Considered:  Ct/mri   Critical Interventions:  ivfs   Problem List / ED Course:  Generalized weakness:  urine and mri brain pending at shift change.   Reevaluation:  After the interventions noted above, I reevaluated the patient and found that they have :improved   Social Determinants of Health:  Lives at home   Dispostion:  Pending at shift change        Final Clinical Impression(s) / ED Diagnoses Final diagnoses:  Weakness  Dehydration    Rx / DC Orders ED Discharge Orders     None         Jacalyn Lefevre, MD 05/15/23 1515

## 2023-05-15 NOTE — ED Triage Notes (Signed)
Pt asked family to call ems because she "felt bad." She has been somewhat weak and had a decreased appetite. Family said her speech was a little bit slurred.  EMS was unable to get an accurate stroke scale due to dx of parkinson's. VSS.

## 2023-05-18 LAB — URINE CULTURE: Culture: >=100000 — AB

## 2023-05-19 ENCOUNTER — Telehealth (HOSPITAL_BASED_OUTPATIENT_CLINIC_OR_DEPARTMENT_OTHER): Payer: Self-pay | Admitting: *Deleted

## 2023-05-19 NOTE — Progress Notes (Signed)
ED Antimicrobial Stewardship Positive Culture Follow Up   [name] is an 82 y.o. female who presented to [facility] on 05/15/2023 with a chief complaint of No chief complaint on file.   Recent Results (from the past 720 hours)  Urine Culture     Status: Abnormal   Collection Time: 05/15/23  6:05 PM   Specimen: Urine, Clean Catch  Result Value Ref Range Status   Specimen Description   Final    URINE, CLEAN CATCH Performed at [facility], [address] [place], [place] [address]    Special Requests   Final    NONE Performed at [facility], [address] [place], [place] [address]    Culture (A)  Final    >=100,000 COLONIES/mL ESCHERICHIA COLI Confirmed Extended Spectrum Beta-Lactamase Producer (ESBL).  In bloodstream infections from ESBL organisms, carbapenems are preferred over piperacillin/tazobactam. They are shown to have a lower risk of mortality.    Report Status 05/18/2023 FINAL  Final   Organism ID, Bacteria ESCHERICHIA COLI (A)  Final      Susceptibility   Escherichia coli - MIC*    AMPICILLIN >=32 RESISTANT Resistant     CEFAZOLIN >=64 RESISTANT Resistant     CEFEPIME 16 RESISTANT Resistant     CEFTRIAXONE >=64 RESISTANT Resistant     CIPROFLOXACIN >=4 RESISTANT Resistant     GENTAMICIN <=1 SENSITIVE Sensitive     IMIPENEM <=0.25 SENSITIVE Sensitive     NITROFURANTOIN <=16 SENSITIVE Sensitive     TRIMETH/SULFA <=20 SENSITIVE Sensitive     AMPICILLIN/SULBACTAM 4 SENSITIVE Sensitive     PIP/TAZO <=4 SENSITIVE Sensitive ug/mL    * >=100,000 COLONIES/mL ESCHERICHIA COLI    [x]  Treated with Keflex, organism resistant to prescribed antimicrobial []  Patient discharged originally without antimicrobial agent and treatment is now indicated  New antibiotic prescription: Augmentin  ED Provider: Gala Lewandowsky, PA-C   [name] 05/19/2023, 7:45 AM Clinical Pharmacist Monday - Friday phone -  [phone] Saturday - Sunday phone - [phone]

## 2023-05-19 NOTE — Telephone Encounter (Signed)
Post ED Visit - Positive Culture Follow-up: Unsuccessful Patient Follow-up  Culture assessed and recommendations reviewed by:  [x]  [name] Pharm.D. []  Celedonio Miyamoto, Pharm.D., BCPS AQ-ID []  Garvin Fila, Pharm.D., BCPS []  Georgina Pillion, Pharm.D., BCPS []  [place], [address].D., BCPS, AAHIVP []  Estella Husk, Pharm.D., BCPS, AAHIVP []  Sherlynn Carbon, PharmD []  Pollyann Samples, PharmD, BCPS  Positive urine culture  []  Patient discharged without antimicrobial prescription and treatment is now indicated [x]  Organism is resistant to prescribed ED discharge antimicrobial []  Patient with positive blood cultures  Plan:  Stop Keflex and start Augment 875mg  BID x 5 days, Gala Lewandowsky, PA-C   Unable to contact patient after 3 attempts, letter will be sent to address on file  Lysle Pearl 05/19/2023, 8:36 AM

## 2023-05-20 DIAGNOSIS — R1310 Dysphagia, unspecified: Secondary | ICD-10-CM | POA: Diagnosis not present

## 2023-05-20 DIAGNOSIS — G629 Polyneuropathy, unspecified: Secondary | ICD-10-CM | POA: Diagnosis not present

## 2023-05-20 DIAGNOSIS — E1165 Type 2 diabetes mellitus with hyperglycemia: Secondary | ICD-10-CM | POA: Diagnosis not present

## 2023-05-20 DIAGNOSIS — G20A1 Parkinson's disease without dyskinesia, without mention of fluctuations: Secondary | ICD-10-CM | POA: Diagnosis not present

## 2023-05-20 DIAGNOSIS — N3000 Acute cystitis without hematuria: Secondary | ICD-10-CM | POA: Diagnosis not present

## 2023-05-20 DIAGNOSIS — M6281 Muscle weakness (generalized): Secondary | ICD-10-CM | POA: Diagnosis not present

## 2023-05-20 DIAGNOSIS — E237 Disorder of pituitary gland, unspecified: Secondary | ICD-10-CM | POA: Diagnosis not present

## 2023-05-20 DIAGNOSIS — E441 Mild protein-calorie malnutrition: Secondary | ICD-10-CM | POA: Diagnosis not present

## 2023-05-20 DIAGNOSIS — D509 Iron deficiency anemia, unspecified: Secondary | ICD-10-CM | POA: Diagnosis not present

## 2023-05-20 DIAGNOSIS — I129 Hypertensive chronic kidney disease with stage 1 through stage 4 chronic kidney disease, or unspecified chronic kidney disease: Secondary | ICD-10-CM | POA: Diagnosis not present

## 2023-05-30 ENCOUNTER — Emergency Department (HOSPITAL_COMMUNITY)
Admission: EM | Admit: 2023-05-30 | Discharge: 2023-05-30 | Disposition: A | Discharge status: Home / Self Care | Payer: Medicare HMO | Attending: Emergency Medicine | Admitting: Emergency Medicine

## 2023-05-30 ENCOUNTER — Other Ambulatory Visit: Payer: Self-pay

## 2023-05-30 ENCOUNTER — Encounter (HOSPITAL_COMMUNITY): Payer: Self-pay | Admitting: *Deleted

## 2023-05-30 DIAGNOSIS — Z7401 Bed confinement status: Secondary | ICD-10-CM | POA: Diagnosis not present

## 2023-05-30 DIAGNOSIS — R251 Tremor, unspecified: Secondary | ICD-10-CM | POA: Diagnosis not present

## 2023-05-30 DIAGNOSIS — R509 Fever, unspecified: Secondary | ICD-10-CM | POA: Diagnosis not present

## 2023-05-30 DIAGNOSIS — R197 Diarrhea, unspecified: Secondary | ICD-10-CM | POA: Insufficient documentation

## 2023-05-30 DIAGNOSIS — R531 Weakness: Secondary | ICD-10-CM | POA: Diagnosis not present

## 2023-05-30 DIAGNOSIS — R110 Nausea: Secondary | ICD-10-CM | POA: Diagnosis not present

## 2023-05-30 DIAGNOSIS — G20A1 Parkinson's disease without dyskinesia, without mention of fluctuations: Secondary | ICD-10-CM | POA: Diagnosis not present

## 2023-05-30 DIAGNOSIS — R29898 Other symptoms and signs involving the musculoskeletal system: Secondary | ICD-10-CM | POA: Diagnosis not present

## 2023-05-30 DIAGNOSIS — E860 Dehydration: Secondary | ICD-10-CM | POA: Insufficient documentation

## 2023-05-30 DIAGNOSIS — I10 Essential (primary) hypertension: Secondary | ICD-10-CM | POA: Diagnosis not present

## 2023-05-30 LAB — CBC WITH DIFFERENTIAL/PLATELET
Abs Immature Granulocytes: 0.05 10*3/uL (ref 0.00–0.07)
Basophils Absolute: 0 10*3/uL (ref 0.0–0.1)
Basophils Relative: 0 %
Eosinophils Absolute: 0 10*3/uL (ref 0.0–0.5)
Eosinophils Relative: 0 %
HCT: 32 % — ABNORMAL LOW (ref 36.0–46.0)
Hemoglobin: 10.3 g/dL — ABNORMAL LOW (ref 12.0–15.0)
Immature Granulocytes: 1 %
Lymphocytes Relative: 13 %
Lymphs Abs: 1.5 10*3/uL (ref 0.7–4.0)
MCH: 29.2 pg (ref 26.0–34.0)
MCHC: 32.2 g/dL (ref 30.0–36.0)
MCV: 90.7 fL (ref 80.0–100.0)
Monocytes Absolute: 0.4 10*3/uL (ref 0.1–1.0)
Monocytes Relative: 4 %
Neutro Abs: 9 10*3/uL — ABNORMAL HIGH (ref 1.7–7.7)
Neutrophils Relative %: 82 %
Platelets: 220 10*3/uL (ref 150–400)
RBC: 3.53 MIL/uL — ABNORMAL LOW (ref 3.87–5.11)
RDW: 16.3 % — ABNORMAL HIGH (ref 11.5–15.5)
WBC: 10.9 10*3/uL — ABNORMAL HIGH (ref 4.0–10.5)
nRBC: 0 % (ref 0.0–0.2)

## 2023-05-30 LAB — COMPREHENSIVE METABOLIC PANEL
ALT: <5 U/L (ref 0–44)
AST: 18 U/L (ref 15–41)
Albumin: 3 g/dL — ABNORMAL LOW (ref 3.5–5.0)
Alkaline Phosphatase: 87 U/L (ref 38–126)
Anion gap: 13 (ref 5–15)
BUN: 31 mg/dL — ABNORMAL HIGH (ref 8–23)
CO2: 26 mmol/L (ref 22–32)
Calcium: 8.9 mg/dL (ref 8.9–10.3)
Chloride: 98 mmol/L (ref 98–111)
Creatinine, Ser: 1.09 mg/dL — ABNORMAL HIGH (ref 0.44–1.00)
GFR, Estimated: 51 mL/min — ABNORMAL LOW (ref >60)
Glucose, Bld: 94 mg/dL (ref 70–99)
Potassium: 3.3 mmol/L — ABNORMAL LOW (ref 3.5–5.1)
Sodium: 137 mmol/L (ref 135–145)
Total Bilirubin: 0.7 mg/dL (ref <1.2)
Total Protein: 6.9 g/dL (ref 6.5–8.1)

## 2023-05-30 LAB — URINALYSIS, ROUTINE W REFLEX MICROSCOPIC
Bacteria, UA: NONE SEEN
Bilirubin Urine: NEGATIVE
Glucose, UA: NEGATIVE mg/dL
Hgb urine dipstick: NEGATIVE
Ketones, ur: 5 mg/dL — AB
Leukocytes,Ua: NEGATIVE
Nitrite: NEGATIVE
Protein, ur: 100 mg/dL — AB
Specific Gravity, Urine: 1.019 (ref 1.005–1.030)
pH: 5 (ref 5.0–8.0)

## 2023-05-30 MED ORDER — SODIUM CHLORIDE 0.9 % IV BOLUS
1000.0000 mL | Freq: Once | INTRAVENOUS | Status: AC
  Administered 2023-05-30: 1000 mL via INTRAVENOUS

## 2023-05-30 NOTE — Discharge Instructions (Signed)
As discussed, your evaluation today has been largely reassuring.  But, it is important that you monitor your condition carefully, and do not hesitate to return to the ED if you develop new, or concerning changes in your condition.  Otherwise, please follow-up with your physician for appropriate ongoing care.  Please use water, Gatorade, Pedialyte or any other liquids that are desirable for appropriate hydration.  If you develop new, or concerning changes return here.  Otherwise, please use Imodium for symptom relief if your diarrhea does recur.

## 2023-05-30 NOTE — ED Notes (Signed)
Discharge instructions reviewed with pt and pt's daughter. Daughter states pt unable to stand and requests nonemergency transportation home. Secretary notified. Pt had very small soft bm. Incontinent care provided and clean brief applied. Pt repositioned. Call light within reach, side rails up x2, bed locked and in lowest position. Daughter remains at bedside. SIL on the way home to await pt's transport.

## 2023-05-30 NOTE — ED Provider Notes (Signed)
[facility] AT [facility] Provider Note   CSN: [id number] Arrival date & time: 05/30/23  1003     History  Chief Complaint  Patient presents with   Diarrhea    [name] is a 82 y.o. female.  HPI Patient presents with concern of weakness and loose stool.  Patient has multiple medical issues most prominently Parkinson's.  Patient also recently completed a course of antibiotics for urinary tract infection.  Reportedly the patient has had loose stool over the past 3-day, without pain, without vomiting.  Patient provides her own history, though with her Parkinson's disease she provides answers very slowly, and briefly.  As above, she denies pain, focal weakness, fever, vomiting.    Home Medications Prior to Admission medications   Medication Sig Start Date End Date Taking? Authorizing Provider  acetaminophen (TYLENOL) 500 MG tablet Take 500 mg by mouth 2 (two) times daily as needed for moderate pain (pain score 4-6), headache or fever.    [provider]  allopurinol (ZYLOPRIM) 100 MG tablet Take 100 mg by mouth daily.    [provider]  amLODipine (NORVASC) 5 MG tablet Take 5 mg by mouth daily.     [provider]  carbidopa-levodopa (SINEMET IR) 25-100 MG tablet Take 1 tablet by mouth 4 (four) times daily. At 6 AM, 10 AM, 2 PM and 6 PM 02/09/23   Lomax, Amy, NP  cephALEXin (KEFLEX) 500 MG capsule Take 1 capsule (500 mg total) by mouth 4 (four) times daily. 05/15/23   Bethann Berkshire, MD  chlorthalidone (HYGROTON) 25 MG tablet Take 12.5 mg by mouth daily.    [provider]  Cholecalciferol (VITAMIN D-3 PO) Take 1 tablet by mouth daily.    [provider]  gabapentin (NEURONTIN) 300 MG capsule Take 1 capsule (300 mg total) by mouth 3 (three) times daily. Start with 1 capsule before bed for 5 days, may then add a second capsule at dinner for 5 days, if needed may add a third capsule in the morning for a total  of three capsules a day. Patient taking differently: Take 300 mg by mouth 2 (two) times daily. 02/09/23   Lomax, Amy, NP  metFORMIN (GLUCOPHAGE) 500 MG tablet Take 500 mg by mouth daily with breakfast.    [provider]  olmesartan (BENICAR) 40 MG tablet Take 40 mg by mouth daily.    [provider]  simvastatin (ZOCOR) 40 MG tablet Take 40 mg by mouth every evening.    [provider]      Allergies    Patient has no known allergies.    Review of Systems   Review of Systems  Physical Exam Updated Vital Signs BP (!) 172/87 (BP Location: Right Arm)   Pulse 80   Temp 98.3 F (36.8 C) (Oral)   Resp 16   Ht 5\' 2"  (1.575 m)   Wt 63.5 kg   SpO2 99%   BMI 25.61 kg/m  Physical Exam Vitals and nursing note reviewed.  Constitutional:      General: She is not in acute distress.    Appearance: She is well-developed.     Comments: Frail-appearing elderly female  HENT:     Head: Normocephalic and atraumatic.  Eyes:     Conjunctiva/sclera: Conjunctivae normal.  Cardiovascular:     Rate and Rhythm: Normal rate and regular rhythm.  Pulmonary:     Effort: Pulmonary effort is normal. No respiratory distress.     Breath  sounds: Normal breath sounds. No stridor.  Abdominal:     General: There is no distension.     Tenderness: There is no abdominal tenderness. There is no guarding.  Skin:    General: Skin is warm and dry.  Neurological:     Mental Status: She is alert and oriented to person, place, and time.     Motor: Tremor and atrophy present.  Psychiatric:        Mood and Affect: Mood normal.     ED Results / Procedures / Treatments   Labs (all labs ordered are listed, but only abnormal results are displayed) Labs Reviewed  COMPREHENSIVE METABOLIC PANEL - Abnormal; Notable for the following components:      Result Value   Potassium 3.3 (*)    BUN 31 (*)    Creatinine, Ser 1.09 (*)    Albumin 3.0 (*)    GFR, Estimated 51 (*)    All other  components within normal limits  CBC WITH DIFFERENTIAL/PLATELET - Abnormal; Notable for the following components:   WBC 10.9 (*)    RBC 3.53 (*)    Hemoglobin 10.3 (*)    HCT 32.0 (*)    RDW 16.3 (*)    Neutro Abs 9.0 (*)    All other components within normal limits  URINALYSIS, ROUTINE W REFLEX MICROSCOPIC - Abnormal; Notable for the following components:   APPearance HAZY (*)    Ketones, ur 5 (*)    Protein, ur 100 (*)    All other components within normal limits    EKG None  Radiology No results found.  Procedures Procedures    Medications Ordered in ED Medications  sodium chloride 0.9 % bolus 1,000 mL (0 mLs Intravenous Stopped 05/30/23 1322)    ED Course/ Medical Decision Making/ A&P                                 Medical Decision Making Elderly female with Parkinson's disease, recent urinary tract infection presents with reported diarrhea.  Interestingly, the patient is afebrile, no abdominal pain on exam, there is low initial suspicion for colitis, C. difficile or otherwise.  Considerations include electrolyte abnormalities, dehydration, infection.  Labs ordered, urinalysis ordered, monitoring started fluids started. Cardiac 80 sinus normal Pulse ox 99% room air normal   Amount and/or Complexity of Data Reviewed Independent Historian: caregiver External Data Reviewed: notes. Labs: ordered. Decision-making details documented in ED Course.  Risk Prescription drug management. Decision regarding hospitalization. Diagnosis or treatment significantly limited by social determinants of health.   2:44 PM Patient accompanied by her husband and daughter.  We discussed all findings have previously discussed evaluation/workup with them. Findings with mild dehydration with ketonuria, no evidence for urinary tract infection, no electrolyte abnormalities.  On attempting to obtain a stool sample for evaluation nursing notes demonstrate the patient had a no diarrhea had  soft, formed stool.  Given this report, some suspicion for the patient's improvement in general, consistent with her absence of abdominal pain, fever, complaints on her part.  Suspicion for dehydration contributing to some of her weakness, particular given accommodation of Parkinson's and recent infection. Absent hemodynamic instability, with no abdominal pain, no evidence for distress, patient appropriate for close outpatient follow-up.        Final Clinical Impression(s) / ED Diagnoses Final diagnoses:  Dehydration  Weakness    Rx / DC Orders ED Discharge Orders     None  Gerhard Munch, MD 05/30/23 [phone]

## 2023-05-30 NOTE — ED Triage Notes (Signed)
Pt BIB [facility] for c/o diarrhea x 3 days with loss of appetite   Family reported to ems that pt has been running low grade temp  Vitals with ems 100.4 T 165/87 BP 90 HR 100% RA  Cbg 119  Pt denies any pain

## 2023-06-06 DIAGNOSIS — I129 Hypertensive chronic kidney disease with stage 1 through stage 4 chronic kidney disease, or unspecified chronic kidney disease: Secondary | ICD-10-CM | POA: Diagnosis not present

## 2023-06-06 DIAGNOSIS — R1310 Dysphagia, unspecified: Secondary | ICD-10-CM | POA: Diagnosis not present

## 2023-06-06 DIAGNOSIS — E441 Mild protein-calorie malnutrition: Secondary | ICD-10-CM | POA: Diagnosis not present

## 2023-06-06 DIAGNOSIS — R531 Weakness: Secondary | ICD-10-CM | POA: Diagnosis not present

## 2023-06-06 DIAGNOSIS — E237 Disorder of pituitary gland, unspecified: Secondary | ICD-10-CM | POA: Diagnosis not present

## 2023-06-06 DIAGNOSIS — D509 Iron deficiency anemia, unspecified: Secondary | ICD-10-CM | POA: Diagnosis not present

## 2023-06-06 DIAGNOSIS — M6281 Muscle weakness (generalized): Secondary | ICD-10-CM | POA: Diagnosis not present

## 2023-06-06 DIAGNOSIS — G629 Polyneuropathy, unspecified: Secondary | ICD-10-CM | POA: Diagnosis not present

## 2023-06-06 DIAGNOSIS — G20A1 Parkinson's disease without dyskinesia, without mention of fluctuations: Secondary | ICD-10-CM | POA: Diagnosis not present

## 2023-07-04 DIAGNOSIS — R531 Weakness: Secondary | ICD-10-CM | POA: Diagnosis not present

## 2023-07-04 DIAGNOSIS — G20A1 Parkinson's disease without dyskinesia, without mention of fluctuations: Secondary | ICD-10-CM | POA: Diagnosis not present

## 2023-07-04 DIAGNOSIS — M6281 Muscle weakness (generalized): Secondary | ICD-10-CM | POA: Diagnosis not present

## 2023-07-04 DIAGNOSIS — G629 Polyneuropathy, unspecified: Secondary | ICD-10-CM | POA: Diagnosis not present

## 2023-07-07 DIAGNOSIS — G20A1 Parkinson's disease without dyskinesia, without mention of fluctuations: Secondary | ICD-10-CM | POA: Diagnosis not present

## 2023-07-07 DIAGNOSIS — E1122 Type 2 diabetes mellitus with diabetic chronic kidney disease: Secondary | ICD-10-CM | POA: Diagnosis not present

## 2023-07-07 DIAGNOSIS — N1831 Chronic kidney disease, stage 3a: Secondary | ICD-10-CM | POA: Diagnosis not present

## 2023-07-07 DIAGNOSIS — E441 Mild protein-calorie malnutrition: Secondary | ICD-10-CM | POA: Diagnosis not present

## 2023-07-07 DIAGNOSIS — M109 Gout, unspecified: Secondary | ICD-10-CM | POA: Diagnosis not present

## 2023-07-07 DIAGNOSIS — E237 Disorder of pituitary gland, unspecified: Secondary | ICD-10-CM | POA: Diagnosis not present

## 2023-07-07 DIAGNOSIS — E1140 Type 2 diabetes mellitus with diabetic neuropathy, unspecified: Secondary | ICD-10-CM | POA: Diagnosis not present

## 2023-07-07 DIAGNOSIS — I700 Atherosclerosis of aorta: Secondary | ICD-10-CM | POA: Diagnosis not present

## 2023-07-07 DIAGNOSIS — I129 Hypertensive chronic kidney disease with stage 1 through stage 4 chronic kidney disease, or unspecified chronic kidney disease: Secondary | ICD-10-CM | POA: Diagnosis not present

## 2023-07-12 DIAGNOSIS — G20A1 Parkinson's disease without dyskinesia, without mention of fluctuations: Secondary | ICD-10-CM | POA: Diagnosis not present

## 2023-07-12 DIAGNOSIS — I700 Atherosclerosis of aorta: Secondary | ICD-10-CM | POA: Diagnosis not present

## 2023-07-12 DIAGNOSIS — E237 Disorder of pituitary gland, unspecified: Secondary | ICD-10-CM | POA: Diagnosis not present

## 2023-07-12 DIAGNOSIS — E441 Mild protein-calorie malnutrition: Secondary | ICD-10-CM | POA: Diagnosis not present

## 2023-07-12 DIAGNOSIS — I129 Hypertensive chronic kidney disease with stage 1 through stage 4 chronic kidney disease, or unspecified chronic kidney disease: Secondary | ICD-10-CM | POA: Diagnosis not present

## 2023-07-12 DIAGNOSIS — E1140 Type 2 diabetes mellitus with diabetic neuropathy, unspecified: Secondary | ICD-10-CM | POA: Diagnosis not present

## 2023-07-12 DIAGNOSIS — M109 Gout, unspecified: Secondary | ICD-10-CM | POA: Diagnosis not present

## 2023-07-12 DIAGNOSIS — E1122 Type 2 diabetes mellitus with diabetic chronic kidney disease: Secondary | ICD-10-CM | POA: Diagnosis not present

## 2023-07-12 DIAGNOSIS — N1831 Chronic kidney disease, stage 3a: Secondary | ICD-10-CM | POA: Diagnosis not present

## 2023-07-13 DIAGNOSIS — G20A1 Parkinson's disease without dyskinesia, without mention of fluctuations: Secondary | ICD-10-CM | POA: Diagnosis not present

## 2023-07-13 DIAGNOSIS — I129 Hypertensive chronic kidney disease with stage 1 through stage 4 chronic kidney disease, or unspecified chronic kidney disease: Secondary | ICD-10-CM | POA: Diagnosis not present

## 2023-07-13 DIAGNOSIS — E1140 Type 2 diabetes mellitus with diabetic neuropathy, unspecified: Secondary | ICD-10-CM | POA: Diagnosis not present

## 2023-07-13 DIAGNOSIS — E441 Mild protein-calorie malnutrition: Secondary | ICD-10-CM | POA: Diagnosis not present

## 2023-07-13 DIAGNOSIS — E1122 Type 2 diabetes mellitus with diabetic chronic kidney disease: Secondary | ICD-10-CM | POA: Diagnosis not present

## 2023-07-13 DIAGNOSIS — N1831 Chronic kidney disease, stage 3a: Secondary | ICD-10-CM | POA: Diagnosis not present

## 2023-07-13 DIAGNOSIS — E237 Disorder of pituitary gland, unspecified: Secondary | ICD-10-CM | POA: Diagnosis not present

## 2023-07-13 DIAGNOSIS — M109 Gout, unspecified: Secondary | ICD-10-CM | POA: Diagnosis not present

## 2023-07-13 DIAGNOSIS — I700 Atherosclerosis of aorta: Secondary | ICD-10-CM | POA: Diagnosis not present

## 2023-07-14 DIAGNOSIS — M109 Gout, unspecified: Secondary | ICD-10-CM | POA: Diagnosis not present

## 2023-07-14 DIAGNOSIS — I129 Hypertensive chronic kidney disease with stage 1 through stage 4 chronic kidney disease, or unspecified chronic kidney disease: Secondary | ICD-10-CM | POA: Diagnosis not present

## 2023-07-14 DIAGNOSIS — G20A1 Parkinson's disease without dyskinesia, without mention of fluctuations: Secondary | ICD-10-CM | POA: Diagnosis not present

## 2023-07-14 DIAGNOSIS — E1140 Type 2 diabetes mellitus with diabetic neuropathy, unspecified: Secondary | ICD-10-CM | POA: Diagnosis not present

## 2023-07-14 DIAGNOSIS — N1831 Chronic kidney disease, stage 3a: Secondary | ICD-10-CM | POA: Diagnosis not present

## 2023-07-14 DIAGNOSIS — E1122 Type 2 diabetes mellitus with diabetic chronic kidney disease: Secondary | ICD-10-CM | POA: Diagnosis not present

## 2023-07-14 DIAGNOSIS — E237 Disorder of pituitary gland, unspecified: Secondary | ICD-10-CM | POA: Diagnosis not present

## 2023-07-14 DIAGNOSIS — I700 Atherosclerosis of aorta: Secondary | ICD-10-CM | POA: Diagnosis not present

## 2023-07-14 DIAGNOSIS — E441 Mild protein-calorie malnutrition: Secondary | ICD-10-CM | POA: Diagnosis not present

## 2023-07-17 ENCOUNTER — Encounter (HOSPITAL_COMMUNITY): Payer: Self-pay

## 2023-07-17 ENCOUNTER — Inpatient Hospital Stay (HOSPITAL_COMMUNITY)
Admission: EM | Admit: 2023-07-17 | Discharge: 2023-08-06 | DRG: 871 | Disposition: E | Payer: Medicare HMO | Attending: Family Medicine | Admitting: Family Medicine

## 2023-07-17 ENCOUNTER — Emergency Department (HOSPITAL_COMMUNITY): Payer: Medicare HMO

## 2023-07-17 ENCOUNTER — Other Ambulatory Visit: Payer: Self-pay

## 2023-07-17 DIAGNOSIS — I712 Thoracic aortic aneurysm, without rupture, unspecified: Secondary | ICD-10-CM | POA: Diagnosis present

## 2023-07-17 DIAGNOSIS — J9601 Acute respiratory failure with hypoxia: Secondary | ICD-10-CM | POA: Diagnosis not present

## 2023-07-17 DIAGNOSIS — R509 Fever, unspecified: Secondary | ICD-10-CM | POA: Diagnosis present

## 2023-07-17 DIAGNOSIS — R4182 Altered mental status, unspecified: Secondary | ICD-10-CM | POA: Diagnosis not present

## 2023-07-17 DIAGNOSIS — I2699 Other pulmonary embolism without acute cor pulmonale: Secondary | ICD-10-CM | POA: Diagnosis present

## 2023-07-17 DIAGNOSIS — I82412 Acute embolism and thrombosis of left femoral vein: Secondary | ICD-10-CM | POA: Diagnosis present

## 2023-07-17 DIAGNOSIS — A4189 Other specified sepsis: Secondary | ICD-10-CM | POA: Diagnosis present

## 2023-07-17 DIAGNOSIS — Z993 Dependence on wheelchair: Secondary | ICD-10-CM

## 2023-07-17 DIAGNOSIS — R0689 Other abnormalities of breathing: Secondary | ICD-10-CM | POA: Diagnosis not present

## 2023-07-17 DIAGNOSIS — R9082 White matter disease, unspecified: Secondary | ICD-10-CM | POA: Diagnosis not present

## 2023-07-17 DIAGNOSIS — I722 Aneurysm of renal artery: Secondary | ICD-10-CM | POA: Diagnosis not present

## 2023-07-17 DIAGNOSIS — Z515 Encounter for palliative care: Secondary | ICD-10-CM | POA: Diagnosis not present

## 2023-07-17 DIAGNOSIS — J9600 Acute respiratory failure, unspecified whether with hypoxia or hypercapnia: Secondary | ICD-10-CM | POA: Diagnosis present

## 2023-07-17 DIAGNOSIS — M1990 Unspecified osteoarthritis, unspecified site: Secondary | ICD-10-CM | POA: Diagnosis present

## 2023-07-17 DIAGNOSIS — Z7401 Bed confinement status: Secondary | ICD-10-CM

## 2023-07-17 DIAGNOSIS — I700 Atherosclerosis of aorta: Secondary | ICD-10-CM | POA: Diagnosis present

## 2023-07-17 DIAGNOSIS — J690 Pneumonitis due to inhalation of food and vomit: Secondary | ICD-10-CM | POA: Diagnosis present

## 2023-07-17 DIAGNOSIS — E119 Type 2 diabetes mellitus without complications: Secondary | ICD-10-CM | POA: Diagnosis present

## 2023-07-17 DIAGNOSIS — I10 Essential (primary) hypertension: Secondary | ICD-10-CM | POA: Diagnosis present

## 2023-07-17 DIAGNOSIS — K5730 Diverticulosis of large intestine without perforation or abscess without bleeding: Secondary | ICD-10-CM | POA: Diagnosis not present

## 2023-07-17 DIAGNOSIS — Z833 Family history of diabetes mellitus: Secondary | ICD-10-CM | POA: Diagnosis not present

## 2023-07-17 DIAGNOSIS — E876 Hypokalemia: Secondary | ICD-10-CM | POA: Diagnosis present

## 2023-07-17 DIAGNOSIS — I82432 Acute embolism and thrombosis of left popliteal vein: Secondary | ICD-10-CM | POA: Diagnosis present

## 2023-07-17 DIAGNOSIS — Z86711 Personal history of pulmonary embolism: Secondary | ICD-10-CM | POA: Diagnosis not present

## 2023-07-17 DIAGNOSIS — M109 Gout, unspecified: Secondary | ICD-10-CM | POA: Diagnosis present

## 2023-07-17 DIAGNOSIS — R55 Syncope and collapse: Secondary | ICD-10-CM | POA: Diagnosis not present

## 2023-07-17 DIAGNOSIS — I959 Hypotension, unspecified: Secondary | ICD-10-CM | POA: Diagnosis not present

## 2023-07-17 DIAGNOSIS — R0902 Hypoxemia: Secondary | ICD-10-CM | POA: Diagnosis not present

## 2023-07-17 DIAGNOSIS — R918 Other nonspecific abnormal finding of lung field: Secondary | ICD-10-CM | POA: Diagnosis not present

## 2023-07-17 DIAGNOSIS — J984 Other disorders of lung: Secondary | ICD-10-CM | POA: Diagnosis not present

## 2023-07-17 DIAGNOSIS — Z7984 Long term (current) use of oral hypoglycemic drugs: Secondary | ICD-10-CM

## 2023-07-17 DIAGNOSIS — Z90710 Acquired absence of both cervix and uterus: Secondary | ICD-10-CM

## 2023-07-17 DIAGNOSIS — G9341 Metabolic encephalopathy: Secondary | ICD-10-CM | POA: Diagnosis present

## 2023-07-17 DIAGNOSIS — J9811 Atelectasis: Secondary | ICD-10-CM | POA: Diagnosis not present

## 2023-07-17 DIAGNOSIS — G20A1 Parkinson's disease without dyskinesia, without mention of fluctuations: Secondary | ICD-10-CM | POA: Diagnosis present

## 2023-07-17 DIAGNOSIS — G9340 Encephalopathy, unspecified: Secondary | ICD-10-CM | POA: Diagnosis not present

## 2023-07-17 DIAGNOSIS — Z841 Family history of disorders of kidney and ureter: Secondary | ICD-10-CM

## 2023-07-17 DIAGNOSIS — N179 Acute kidney failure, unspecified: Secondary | ICD-10-CM | POA: Diagnosis not present

## 2023-07-17 DIAGNOSIS — Z8249 Family history of ischemic heart disease and other diseases of the circulatory system: Secondary | ICD-10-CM

## 2023-07-17 DIAGNOSIS — Z66 Do not resuscitate: Secondary | ICD-10-CM | POA: Diagnosis present

## 2023-07-17 DIAGNOSIS — E669 Obesity, unspecified: Secondary | ICD-10-CM | POA: Diagnosis present

## 2023-07-17 DIAGNOSIS — U071 COVID-19: Secondary | ICD-10-CM | POA: Diagnosis present

## 2023-07-17 DIAGNOSIS — Z79899 Other long term (current) drug therapy: Secondary | ICD-10-CM

## 2023-07-17 DIAGNOSIS — Z8701 Personal history of pneumonia (recurrent): Secondary | ICD-10-CM

## 2023-07-17 DIAGNOSIS — E7800 Pure hypercholesterolemia, unspecified: Secondary | ICD-10-CM | POA: Diagnosis present

## 2023-07-17 DIAGNOSIS — J111 Influenza due to unidentified influenza virus with other respiratory manifestations: Secondary | ICD-10-CM | POA: Diagnosis not present

## 2023-07-17 DIAGNOSIS — I6523 Occlusion and stenosis of bilateral carotid arteries: Secondary | ICD-10-CM | POA: Diagnosis not present

## 2023-07-17 DIAGNOSIS — Z96651 Presence of right artificial knee joint: Secondary | ICD-10-CM | POA: Diagnosis present

## 2023-07-17 DIAGNOSIS — E041 Nontoxic single thyroid nodule: Secondary | ICD-10-CM | POA: Diagnosis not present

## 2023-07-17 DIAGNOSIS — I2693 Single subsegmental pulmonary embolism without acute cor pulmonale: Secondary | ICD-10-CM | POA: Diagnosis not present

## 2023-07-17 LAB — COMPREHENSIVE METABOLIC PANEL
ALT: 9 U/L (ref 0–44)
AST: 12 U/L — ABNORMAL LOW (ref 15–41)
Albumin: 2.7 g/dL — ABNORMAL LOW (ref 3.5–5.0)
Alkaline Phosphatase: 74 U/L (ref 38–126)
Anion gap: 12 (ref 5–15)
BUN: 23 mg/dL (ref 8–23)
CO2: 27 mmol/L (ref 22–32)
Calcium: 8.8 mg/dL — ABNORMAL LOW (ref 8.9–10.3)
Chloride: 101 mmol/L (ref 98–111)
Creatinine, Ser: 0.79 mg/dL (ref 0.44–1.00)
GFR, Estimated: >60 mL/min (ref >60)
Glucose, Bld: 108 mg/dL — ABNORMAL HIGH (ref 70–99)
Potassium: 3.2 mmol/L — ABNORMAL LOW (ref 3.5–5.1)
Sodium: 140 mmol/L (ref 135–145)
Total Bilirubin: 0.7 mg/dL (ref 0.0–1.2)
Total Protein: 6.1 g/dL — ABNORMAL LOW (ref 6.5–8.1)

## 2023-07-17 LAB — CBC WITH DIFFERENTIAL/PLATELET
Abs Immature Granulocytes: 0.11 10*3/uL — ABNORMAL HIGH (ref 0.00–0.07)
Basophils Absolute: 0 10*3/uL (ref 0.0–0.1)
Basophils Relative: 0 %
Eosinophils Absolute: 0 10*3/uL (ref 0.0–0.5)
Eosinophils Relative: 0 %
HCT: 30.7 % — ABNORMAL LOW (ref 36.0–46.0)
Hemoglobin: 10 g/dL — ABNORMAL LOW (ref 12.0–15.0)
Immature Granulocytes: 1 %
Lymphocytes Relative: 9 %
Lymphs Abs: 0.9 10*3/uL (ref 0.7–4.0)
MCH: 29.9 pg (ref 26.0–34.0)
MCHC: 32.6 g/dL (ref 30.0–36.0)
MCV: 91.9 fL (ref 80.0–100.0)
Monocytes Absolute: 0.6 10*3/uL (ref 0.1–1.0)
Monocytes Relative: 6 %
Neutro Abs: 8.4 10*3/uL — ABNORMAL HIGH (ref 1.7–7.7)
Neutrophils Relative %: 84 %
Platelets: 193 10*3/uL (ref 150–400)
RBC: 3.34 MIL/uL — ABNORMAL LOW (ref 3.87–5.11)
RDW: 16.3 % — ABNORMAL HIGH (ref 11.5–15.5)
WBC: 10 10*3/uL (ref 4.0–10.5)
nRBC: 0.3 % — ABNORMAL HIGH (ref 0.0–0.2)

## 2023-07-17 LAB — URINALYSIS, W/ REFLEX TO CULTURE (INFECTION SUSPECTED)
Bilirubin Urine: NEGATIVE
Glucose, UA: NEGATIVE mg/dL
Ketones, ur: NEGATIVE mg/dL
Leukocytes,Ua: NEGATIVE
Nitrite: NEGATIVE
Protein, ur: 100 mg/dL — AB
RBC / HPF: >50 RBC/hpf (ref 0–5)
Specific Gravity, Urine: 1.027 (ref 1.005–1.030)
pH: 7 (ref 5.0–8.0)

## 2023-07-17 LAB — HEMOGLOBIN A1C
Hgb A1c MFr Bld: 5.2 % (ref 4.8–5.6)
Mean Plasma Glucose: 102.54 mg/dL

## 2023-07-17 LAB — BRAIN NATRIURETIC PEPTIDE: B Natriuretic Peptide: 107 pg/mL — ABNORMAL HIGH (ref 0.0–100.0)

## 2023-07-17 LAB — RESP PANEL BY RT-PCR (RSV, FLU A&B, COVID)  RVPGX2
Influenza A by PCR: NEGATIVE
Influenza B by PCR: NEGATIVE
Resp Syncytial Virus by PCR: NEGATIVE
SARS Coronavirus 2 by RT PCR: POSITIVE — AB

## 2023-07-17 LAB — LACTATE DEHYDROGENASE: LDH: 170 U/L (ref 98–192)

## 2023-07-17 LAB — LACTIC ACID, PLASMA: Lactic Acid, Venous: 0.9 mmol/L (ref 0.5–1.9)

## 2023-07-17 LAB — D-DIMER, QUANTITATIVE: D-Dimer, Quant: >20 ug{FEU}/mL — ABNORMAL HIGH (ref 0.00–0.50)

## 2023-07-17 LAB — PROTIME-INR
INR: 1.1 (ref 0.8–1.2)
Prothrombin Time: 14.3 s (ref 11.4–15.2)

## 2023-07-17 LAB — MAGNESIUM: Magnesium: 1.7 mg/dL (ref 1.7–2.4)

## 2023-07-17 LAB — FERRITIN: Ferritin: 87 ng/mL (ref 11–307)

## 2023-07-17 LAB — GLUCOSE, CAPILLARY: Glucose-Capillary: 107 mg/dL — ABNORMAL HIGH (ref 70–99)

## 2023-07-17 LAB — APTT: aPTT: 29 s (ref 24–36)

## 2023-07-17 MED ORDER — IOHEXOL 300 MG/ML  SOLN
100.0000 mL | Freq: Once | INTRAMUSCULAR | Status: AC | PRN
  Administered 2023-07-17: 100 mL via INTRAVENOUS

## 2023-07-17 MED ORDER — HEPARIN BOLUS VIA INFUSION
3200.0000 [IU] | Freq: Once | INTRAVENOUS | Status: AC
  Administered 2023-07-17: 3200 [IU] via INTRAVENOUS

## 2023-07-17 MED ORDER — ALLOPURINOL 100 MG PO TABS
100.0000 mg | ORAL_TABLET | Freq: Every day | ORAL | Status: DC
  Administered 2023-07-17 – 2023-07-20 (×4): 100 mg via ORAL
  Filled 2023-07-17 (×4): qty 1

## 2023-07-17 MED ORDER — LACTATED RINGERS IV BOLUS (SEPSIS)
1000.0000 mL | Freq: Once | INTRAVENOUS | Status: AC
  Administered 2023-07-17: 1000 mL via INTRAVENOUS

## 2023-07-17 MED ORDER — GABAPENTIN 300 MG PO CAPS
300.0000 mg | ORAL_CAPSULE | Freq: Two times a day (BID) | ORAL | Status: DC
  Administered 2023-07-17 – 2023-07-20 (×6): 300 mg via ORAL
  Filled 2023-07-17 (×6): qty 1

## 2023-07-17 MED ORDER — ALBUTEROL SULFATE (2.5 MG/3ML) 0.083% IN NEBU: 2.5000 mg | INHALATION_SOLUTION | Freq: Four times a day (QID) | RESPIRATORY_TRACT | Status: DC | PRN

## 2023-07-17 MED ORDER — NIRMATRELVIR/RITONAVIR (PAXLOVID)TABLET: 3.0000 | ORAL_TABLET | Freq: Two times a day (BID) | ORAL | Status: DC

## 2023-07-17 MED ORDER — POLYETHYLENE GLYCOL 3350 17 G PO PACK: 17.0000 g | PACK | Freq: Every day | ORAL | Status: DC | PRN

## 2023-07-17 MED ORDER — ONDANSETRON HCL 4 MG/2ML IJ SOLN
4.0000 mg | Freq: Four times a day (QID) | INTRAMUSCULAR | Status: DC | PRN
Start: 2023-07-17 — End: 2023-07-20

## 2023-07-17 MED ORDER — ACETAMINOPHEN 650 MG RE SUPP
650.0000 mg | Freq: Once | RECTAL | Status: AC
  Administered 2023-07-17: 650 mg via RECTAL
  Filled 2023-07-17: qty 1

## 2023-07-17 MED ORDER — INSULIN ASPART 100 UNIT/ML IJ SOLN
0.0000 [IU] | Freq: Three times a day (TID) | INTRAMUSCULAR | Status: DC
  Administered 2023-07-18 – 2023-07-19 (×4): 2 [IU] via SUBCUTANEOUS
  Administered 2023-07-19: 5 [IU] via SUBCUTANEOUS
  Administered 2023-07-20: 1 [IU] via SUBCUTANEOUS
  Administered 2023-07-20: 2 [IU] via SUBCUTANEOUS

## 2023-07-17 MED ORDER — GUAIFENESIN-DM 100-10 MG/5ML PO SYRP
15.0000 mL | ORAL_SOLUTION | Freq: Three times a day (TID) | ORAL | Status: AC
  Administered 2023-07-17 – 2023-07-18 (×3): 15 mL via ORAL
  Filled 2023-07-17 (×3): qty 15

## 2023-07-17 MED ORDER — POTASSIUM CHLORIDE 20 MEQ PO PACK
40.0000 meq | PACK | ORAL | Status: AC
Start: 2023-07-17 — End: 2023-07-17
  Administered 2023-07-17 (×2): 40 meq via ORAL
  Filled 2023-07-17 (×2): qty 2

## 2023-07-17 MED ORDER — ACETAMINOPHEN 650 MG RE SUPP
650.0000 mg | Freq: Four times a day (QID) | RECTAL | Status: DC | PRN
Start: 2023-07-17 — End: 2023-07-20
  Administered 2023-07-20: 650 mg via RECTAL
  Filled 2023-07-17: qty 1

## 2023-07-17 MED ORDER — CARBIDOPA-LEVODOPA 25-100 MG PO TABS
1.0000 | ORAL_TABLET | Freq: Four times a day (QID) | ORAL | Status: DC
  Administered 2023-07-18 – 2023-07-20 (×7): 1 via ORAL
  Filled 2023-07-17 (×20): qty 1

## 2023-07-17 MED ORDER — NIRMATRELVIR&RITONAVIR 150/100 10 X 150 MG & 10 X 100MG PO TBPK
2.0000 | ORAL_TABLET | Freq: Two times a day (BID) | ORAL | Status: DC
  Administered 2023-07-17 – 2023-07-20 (×6): 2 via ORAL
  Filled 2023-07-17: qty 20

## 2023-07-17 MED ORDER — HEPARIN (PORCINE) 25000 UT/250ML-% IV SOLN
650.0000 [IU]/h | INTRAVENOUS | Status: DC
  Administered 2023-07-17: 900 [IU]/h via INTRAVENOUS
  Administered 2023-07-18: 750 [IU]/h via INTRAVENOUS
  Administered 2023-07-20: 650 [IU]/h via INTRAVENOUS
  Filled 2023-07-17 (×3): qty 250

## 2023-07-17 MED ORDER — AMLODIPINE BESYLATE 5 MG PO TABS
5.0000 mg | ORAL_TABLET | Freq: Every day | ORAL | Status: DC
  Administered 2023-07-17 – 2023-07-19 (×3): 5 mg via ORAL
  Filled 2023-07-17 (×3): qty 1

## 2023-07-17 MED ORDER — ACETAMINOPHEN 325 MG PO TABS
650.0000 mg | ORAL_TABLET | Freq: Four times a day (QID) | ORAL | Status: DC | PRN
  Administered 2023-07-17 – 2023-07-18 (×2): 650 mg via ORAL
  Filled 2023-07-17 (×2): qty 2

## 2023-07-17 MED ORDER — INSULIN ASPART 100 UNIT/ML IJ SOLN: 0.0000 [IU] | Freq: Every day | INTRAMUSCULAR | Status: DC

## 2023-07-17 MED ORDER — ONDANSETRON HCL 4 MG PO TABS
4.0000 mg | ORAL_TABLET | Freq: Four times a day (QID) | ORAL | Status: DC | PRN
Start: 2023-07-17 — End: 2023-07-20

## 2023-07-17 MED ORDER — POTASSIUM CHLORIDE 20 MEQ PO PACK: 40.0000 meq | PACK | Freq: Once | ORAL | Status: DC

## 2023-07-17 NOTE — Assessment & Plan Note (Addendum)
 Presenting with fever of 101.3, cough, weakness.  No dyspnea.  Not hypoxic-sats 91 to 96% on room air.  CT chest with findings suggestive of bronchitis.  Acute pulmonary embolism in the setting of COVID infection. -Paxlovid  -Obtain inflammatory panel -Albuterol  inhaler -Mucolytics - Follow up [facility]

## 2023-07-17 NOTE — Assessment & Plan Note (Addendum)
 Resume allopurinol

## 2023-07-17 NOTE — Assessment & Plan Note (Addendum)
 Stable. -Resume Norvasc  - Olmesartan and HCTZ held for contrast exposure

## 2023-07-17 NOTE — Assessment & Plan Note (Signed)
 Pulmonary embolism in the setting of COVID infection, and new immobility over the past month.  Not hypoxic.  Patient with limited mobility-ambulating with wheelchair, but over the past month she has been bedbound.  No history of GI bleeds. -Heparin  drip started in ED -Obtain echocardiogram - Bilateral lower extremity venous Dopplers

## 2023-07-17 NOTE — Assessment & Plan Note (Signed)
-   HgbA1c - SSI- S - Hold Metformin

## 2023-07-17 NOTE — Assessment & Plan Note (Signed)
 Hold simvastatin   while on Paxlovid 

## 2023-07-17 NOTE — ED Provider Notes (Signed)
 [place] EMERGENCY DEPARTMENT AT [facility] Provider Note   CSN: [id number] Arrival date & time: 07/17/23  1144     History  Chief Complaint  Patient presents with   Fever    [name] is a 83 y.o. female.  She is brought in by ambulance from home for weakness.  Reportedly she was diagnosed with the flu yesterday and today had a temperature that did not respond to Tylenol  and so they called EMS.  Patient herself is very weak and is not able to give me much for history.  She is complaining of bodyaches.  She said she has been sick for a month.  Awaiting family for more history.  The history is provided by the patient and the EMS personnel.  Fever Relieved by:  Nothing Ineffective treatments:  Acetaminophen       Home Medications Prior to Admission medications   Medication Sig Start Date End Date Taking? Authorizing Provider  acetaminophen  (TYLENOL ) 500 MG tablet Take 500 mg by mouth 2 (two) times daily as needed for moderate pain (pain score 4-6), headache or fever.    [provider]  allopurinol  (ZYLOPRIM ) 100 MG tablet Take 100 mg by mouth daily.    [provider]  amLODipine  (NORVASC ) 5 MG tablet Take 5 mg by mouth daily.     [provider]  carbidopa -levodopa  (SINEMET  IR) 25-100 MG tablet Take 1 tablet by mouth 4 (four) times daily. At 6 AM, 10 AM, 2 PM and 6 PM 02/09/23   Lomax, Amy, NP  cephALEXin  (KEFLEX ) 500 MG capsule Take 1 capsule (500 mg total) by mouth 4 (four) times daily. 05/15/23   Suzette Pac, MD  chlorthalidone  (HYGROTON ) 25 MG tablet Take 12.5 mg by mouth daily.    [provider]  Cholecalciferol (VITAMIN D -3 PO) Take 1 tablet by mouth daily.    [provider]  gabapentin  (NEURONTIN ) 300 MG capsule Take 1 capsule (300 mg total) by mouth 3 (three) times daily. Start with 1 capsule before bed for 5 days, may then add a second capsule at dinner for 5 days, if needed may add a third capsule in the  morning for a total of three capsules a day. Patient taking differently: Take 300 mg by mouth 2 (two) times daily. 02/09/23   Lomax, Amy, NP  metFORMIN (GLUCOPHAGE) 500 MG tablet Take 500 mg by mouth daily with breakfast.    [provider]  olmesartan (BENICAR) 40 MG tablet Take 40 mg by mouth daily.    [provider]  simvastatin  (ZOCOR ) 40 MG tablet Take 40 mg by mouth every evening.    [provider]      Allergies    Patient has no known allergies.    Review of Systems   Review of Systems  Unable to perform ROS: Mental status change  Constitutional:  Positive for fever.    Physical Exam Updated Vital Signs BP (!) 155/75   Pulse 92   Temp (!) 101.3 F (38.5 C) (Rectal)   Resp 20   Ht 5' 2 (1.575 m)   Wt 63.5 kg   SpO2 92%   BMI 25.60 kg/m  Physical Exam Vitals and nursing note reviewed.  Constitutional:      General: She is not in acute distress.    Appearance: Normal appearance. She is well-developed. She is ill-appearing.  HENT:     Head: Normocephalic and atraumatic.  Eyes:     Conjunctiva/sclera: Conjunctivae normal.  Cardiovascular:  Rate and Rhythm: Normal rate and regular rhythm.     Heart sounds: No murmur heard. Pulmonary:     Effort: Pulmonary effort is normal. No respiratory distress.     Breath sounds: Normal breath sounds. No stridor. No wheezing.  Abdominal:     Palpations: Abdomen is soft.     Tenderness: There is no abdominal tenderness. There is no guarding or rebound.  Musculoskeletal:        General: No tenderness or deformity. Normal range of motion.     Cervical back: Neck supple.  Skin:    General: Skin is warm and dry.  Neurological:     General: No focal deficit present.     GCS: GCS eye subscore is 4. GCS verbal subscore is 5. GCS motor subscore is 6.     Comments: She is awake and will answer some simple questions.  No gross facial asymmetry.  She has symmetric grip strength.  She can flex and extend  her foot but cannot lift her legs off the bed.     ED Results / Procedures / Treatments   Labs (all labs ordered are listed, but only abnormal results are displayed) Labs Reviewed  RESP PANEL BY RT-PCR (RSV, FLU A&B, COVID)  RVPGX2 - Abnormal; Notable for the following components:      Result Value   SARS Coronavirus 2 by RT PCR POSITIVE (*)    All other components within normal limits  COMPREHENSIVE METABOLIC PANEL - Abnormal; Notable for the following components:   Potassium 3.2 (*)    Glucose, Bld 108 (*)    Calcium 8.8 (*)    Total Protein 6.1 (*)    Albumin 2.7 (*)    AST 12 (*)    All other components within normal limits  CBC WITH DIFFERENTIAL/PLATELET - Abnormal; Notable for the following components:   RBC 3.34 (*)    Hemoglobin 10.0 (*)    HCT 30.7 (*)    RDW 16.3 (*)    nRBC 0.3 (*)    Neutro Abs 8.4 (*)    Abs Immature Granulocytes 0.11 (*)    All other components within normal limits  BRAIN NATRIURETIC PEPTIDE - Abnormal; Notable for the following components:   B Natriuretic Peptide 107.0 (*)    All other components within normal limits  CULTURE, BLOOD (ROUTINE X 2)  CULTURE, BLOOD (ROUTINE X 2)  LACTIC ACID, PLASMA  PROTIME-INR  APTT  MAGNESIUM   LACTATE DEHYDROGENASE  FERRITIN  URINALYSIS, W/ REFLEX TO CULTURE (INFECTION SUSPECTED)  HEPARIN  LEVEL (UNFRACTIONATED)  D-DIMER, QUANTITATIVE  C-REACTIVE PROTEIN  HEMOGLOBIN A1C  CBC    EKG EKG Interpretation Date/Time:  Sunday July 17 2023 12:28:07 EST Ventricular Rate:  88 PR Interval:  158 QRS Duration:  82 QT Interval:  354 QTC Calculation: 429 R Axis:   6  Text Interpretation: Sinus rhythm Left ventricular hypertrophy No significant change since prior 12/24 Confirmed by Towana Sharper [phone]) on 07/17/2023 12:42:30 PM  Radiology CT CHEST ABDOMEN PELVIS W CONTRAST Result Date: 07/17/2023 CLINICAL DATA:  Sepsis.  Mental status change.  Fever. EXAM: CT CHEST, ABDOMEN, AND PELVIS WITH CONTRAST  TECHNIQUE: Multidetector CT imaging of the chest, abdomen and pelvis was performed following the standard protocol during bolus administration of intravenous contrast. RADIATION DOSE REDUCTION: This exam was performed according to the departmental dose-optimization program which includes automated exposure control, adjustment of the mA and/or kV according to patient size and/or use of iterative reconstruction technique. CONTRAST:  OMNIPAQUE  IOHEXOL  300  MG/ML  SOLN COMPARISON:  Chest radiograph from earlier today. 12/09/2016 chest CT. 10/09/2016 CT chest, abdomen and pelvis. FINDINGS: CT CHEST FINDINGS Cardiovascular: Normal heart size. No significant pericardial effusion/thickening. Atherosclerotic nonaneurysmal thoracic aorta. Normal caliber pulmonary arteries. Acute segmental pulmonary embolism in the lingula (series 5/image 70). No more central PE. Mediastinum/Nodes: Hypodense 1.4 cm right thyroid  nodule. No follow-up imaging is recommended.Unremarkable esophagus. No pathologically enlarged axillary, mediastinal or hilar lymph nodes. Lungs/Pleura: No pneumothorax. No pleural effusion. No acute consolidative airspace disease, lung masses or significant pulmonary nodules. Mild diffuse bronchial wall thickening. Musculoskeletal: No aggressive appearing focal osseous lesions. Moderate thoracic spondylosis. CT ABDOMEN PELVIS FINDINGS Hepatobiliary: Normal liver size. Lobulated benign-appearing 4.6 cm posterior right liver cyst with additional scattered subcentimeter hypodense right liver lesions that are too small to characterize. Normal gallbladder with no radiopaque cholelithiasis. No biliary ductal dilatation. Pancreas: Normal, with no mass or duct dilation. Spleen: Normal size spleen. Subcentimeter hypodense superior splenic lesion is too small to characterize. No additional splenic lesions. Adrenals/Urinary Tract: Normal adrenals. No hydronephrosis. Numerous scattered simple bilateral renal cysts up to the  3.6 cm in the upper left kidney with numerous additional subcentimeter hypodense bilateral renal cortical lesions that are too small to characterize, for which no follow-up imaging is recommended. 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 Normal bladder. Stomach/Bowel: Normal non-distended stomach. Normal caliber small bowel with no small bowel wall thickening. Appendix not discretely visualized. Marked left colonic diverticulosis, most prominent in the sigmoid colon. Chronic wall thickening in the proximal sigmoid colon, which appears chronically adherent to the left ovary, not substantially changed from 10/09/2016 CT abdomen/pelvis study, favoring chronic diverticulosis. No acute large bowel wall thickening or acute pericolonic fat stranding. Vascular/Lymphatic: Atherosclerotic nonaneurysmal abdominal aorta. Patent portal, splenic, hepatic and renal veins. No pathologically enlarged lymph nodes in the abdomen or pelvis. Reproductive: Status post hysterectomy, with no abnormal findings at the vaginal cuff. No adnexal mass. Other: No pneumoperitoneum, ascites or focal fluid collection. Musculoskeletal: No aggressive appearing focal osseous lesions. Moderate lumbar spondylosis. IMPRESSION: 1. Incidental acute segmental pulmonary embolism in the lingula. No more central PE. 2. No acute pulmonary parenchymal abnormality. Mild diffuse bronchial wall thickening, suggesting bronchitis. 3. No acute abnormality in the abdomen or pelvis. 4. Marked left colonic diverticulosis, most prominent in the sigmoid colon. Chronic wall thickening in the proximal sigmoid colon, which appears chronically adherent to the left ovary, not substantially changed from 10/09/2016 CT abdomen/pelvis study, favoring chronic diverticulosis. 5. 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 6.  Aortic Atherosclerosis (ICD10-I70.0). Critical Value/emergent results were called by telephone at the  time of interpretation on 07/17/2023 at 2:48 pm to provider [facility] , who verbally acknowledged these results. Electronically Signed   By: Selinda DELENA Blue M.D.   On: 07/17/2023 14:50   CT Head Wo Contrast Result Date: 07/17/2023 CLINICAL DATA:  Mental status change, unknown cause. Recent diagnosis with influenza fever. EXAM: CT HEAD WITHOUT CONTRAST TECHNIQUE: Contiguous axial images were obtained from the base of the skull through the vertex without intravenous contrast. RADIATION DOSE REDUCTION: This exam was performed according to the departmental dose-optimization program which includes automated exposure control, adjustment of the mA and/or kV according to patient size and/or use of iterative reconstruction technique. COMPARISON:  CT head without contrast 05/15/2023 FINDINGS: Brain: No acute infarct, hemorrhage, or mass lesion is present. Moderate atrophy and diffuse white matter disease is stable. Basal ganglia are stable. The ventricles are of normal size. No  significant extraaxial fluid collection is present. Atherosclerotic calcifications are present at the cavernous internal carotid arteries bilaterally. No other significant edema is present. Midline structures are within normal limits. The brainstem and cerebellum are within normal limits. Vascular: Minimal atherosclerotic calcifications are present within the cavernous internal carotid arteries bilaterally. No hyperdense vessel is present. Skull: Calvarium is intact. Calvarium is intact. No focal lytic or blastic lesions are present. No significant extracranial soft tissue lesion is present. Sinuses/Orbits: Minimal mucosal thickening is present along the inferior right maxillary sinus and inferior right frontal sinus. No fluid levels are present. The paranasal sinuses and mastoid air cells are otherwise clear. Bilateral lens replacements are noted. Globes and orbits are otherwise unremarkable. IMPRESSION: 1. No acute intracranial abnormality or  significant interval change. 2. Stable atrophy and diffuse white matter disease. This likely reflects the sequela of chronic microvascular ischemia. 3. Minimal right maxillary and frontal sinus disease. Electronically Signed   By: Lonni Necessary M.D.   On: 07/17/2023 14:37   DG Chest Port 1 View Result Date: 07/17/2023 CLINICAL DATA:  Fever.  Diagnosed with flu yesterday. EXAM: PORTABLE CHEST 1 VIEW COMPARISON:  05/15/2023 FINDINGS: Stable cardiomediastinal silhouette. Aortic atherosclerotic calcification. Low lung volumes accentuate pulmonary vascularity. Left basilar atelectasis. Otherwise no focal consolidation, pleural effusion, or pneumothorax. No displaced rib fractures. IMPRESSION: Low lung volumes with left basilar atelectasis. Electronically Signed   By: Norman Gatlin M.D.   On: 07/17/2023 12:36    Procedures .Critical Care  Performed by: Towana Ozell BROCKS, MD Authorized by: Towana Ozell BROCKS, MD   Critical care provider statement:    Critical care time (minutes):  45   Critical care time was exclusive of:  Separately billable procedures and treating other patients   Critical care was necessary to treat or prevent imminent or life-threatening deterioration of the following conditions:  Respiratory failure and CNS failure or compromise   Critical care was time spent personally by me on the following activities:  Development of treatment plan with patient or surrogate, discussions with consultants, evaluation of patient's response to treatment, examination of patient, obtaining history from patient or surrogate, ordering and performing treatments and interventions, ordering and review of laboratory studies, ordering and review of radiographic studies, pulse oximetry, re-evaluation of patient's condition and review of old charts   I assumed direction of critical care for this patient from another provider in my specialty: no       Medications Ordered in ED Medications  heparin   ADULT infusion 100 units/mL (25000 units/250mL) (900 Units/hr Intravenous New Bag/Given 07/17/23 1515)  potassium chloride  (KLOR-CON ) packet 40 mEq (40 mEq Oral Given 07/17/23 1708)  nirmatrelvir /ritonavir  (renal dosing) (PAXLOVID ) 2 tablet (has no administration in time range)  lactated ringers  bolus 1,000 mL (0 mLs Intravenous Stopped 07/17/23 1448)  acetaminophen  (TYLENOL ) suppository 650 mg (650 mg Rectal Given 07/17/23 1254)  iohexol  (OMNIPAQUE ) 300 MG/ML solution 100 mL (100 mLs Intravenous Contrast Given 07/17/23 1413)  heparin  bolus via infusion 3,200 Units (3,200 Units Intravenous Bolus from Bag 07/17/23 1516)    ED Course/ Medical Decision Making/ A&P Clinical Course as of 07/17/23 1729  Sun Jul 17, 2023  1241 Chest x-ray without clear infiltrate.  Awaiting radiology reading. [MB]  1258 Family here now and daughter is giving better history.  She was fine 2 days ago, yesterday started with scratchy throat.  Today did not wake up at her normal time and so daughter checked on her and found her to be febrile.  She has had  a cough.  There was 1 person at the house who has been sick with a cold.  Patient at baseline has Parkinson's, usually alert but has difficulty speaking.  He is not ambulatory at baseline. [MB]  1452 Received a call from radiology that the patient has a small PE.  They did not see anything else definite in the chest or abdomen.  Will order heparin .  Also have paged the hospitalist for admission. [MB]  1524 Discussed with [facility] hospitalist Dr. Pearlean who will evaluate the patient for admission. [MB]    Clinical Course User Index [MB] Towana Ozell BROCKS, MD                                 Medical Decision Making Amount and/or Complexity of Data Reviewed Labs: ordered. Radiology: ordered.  Risk OTC drugs. Prescription drug management. Decision regarding hospitalization.   This patient complains of general weakness fever cough; this involves an extensive number of  treatment Options and is a complaint that carries with it a high risk of complications and morbidity. The differential includes COVID, flu, pneumonia, PE, pneumothorax, viral syndrome  I ordered, reviewed and interpreted labs, which included CBC with normal white count stable low hemoglobin, chemistries with low potassium, COVID-positive, lactate normal, blood culture sent I ordered medication Tylenol , fluids, IV heparin  and reviewed PMP when indicated. I ordered imaging studies which included chest x-ray, head CT, CT chest abdomen pelvis and I independently    visualized and interpreted imaging which showed acute PE Additional history obtained from patient's daughter and husband Previous records obtained and reviewed in epic, in the emergency department twice in December for weakness and dehydration I consulted [facility] hospitalist Dr. Pearlean and discussed lab and imaging findings and discussed disposition.  Cardiac monitoring reviewed, sinus rhythm Social determinants considered, no significant barriers Critical Interventions: Complex workup for altered mental status fever requiring lab work imaging and management of PE  After the interventions stated above, I reevaluated the patient and found patient to be resting comfortably satting well on room air Admission and further testing considered, she would benefit from mission to the hospital for further anticoagulation and management of her illness.  Family in agreement with plan for admission.         Final Clinical Impression(s) / ED Diagnoses Final diagnoses:  Other acute pulmonary embolism without acute cor pulmonale (HCC)  COVID-19 virus infection  Acute encephalopathy    Rx / DC Orders ED Discharge Orders     None         Towana Ozell BROCKS, MD 07/17/23 1733

## 2023-07-17 NOTE — ED Notes (Signed)
 Talked to receiving RN and she is going to call back shortly as she was in the middle of a procedure with a patient.

## 2023-07-17 NOTE — ED Triage Notes (Signed)
 Pt BIB [facility] for fever. Pt was diagnosed with Flu yesterday and family called EMS because they gave 500 mg tylenol  and temp will not break. Per EMS temp axillary 101.3.

## 2023-07-17 NOTE — Progress Notes (Signed)
 PHARMACY - ANTICOAGULATION CONSULT NOTE  Pharmacy Consult for heparin  Indication: pulmonary embolus  No Known Allergies  Patient Measurements: Height: 5' 2 (157.5 cm) Weight: 63.5 kg (139 lb 15.9 oz) IBW/kg (Calculated) : 50.1 Heparin  Dosing Weight: 63 kg  Vital Signs: Temp: 101.3 F (38.5 C) (02/09 1153) Temp Source: Rectal (02/09 1153) BP: 159/76 (02/09 1400) Pulse Rate: 84 (02/09 1400)  Labs: Recent Labs    07/17/23 1246  HGB 10.0*  HCT 30.7*  PLT 193  APTT 29  LABPROT 14.3  INR 1.1  CREATININE 0.79    Estimated Creatinine Clearance: 47.5 mL/min (by C-G formula based on SCr of 0.79 mg/dL).   Medical History: Past Medical History:  Diagnosis Date   Aortic atherosclerosis (HCC) 09/11/2016   noted on CXR   Arthritis    Baker's cyst, left 2014   Small, Left   Bilateral leg numbness    Cataract    Bilateral   Diabetes mellitus without complication (HCC)    Diverticulosis 10/09/2016   Noted on CT abd/pelvis   Gout    Grade I diastolic dysfunction 09/12/2016   Noted on ECHO   Hepatic cyst 10/09/2016   Noted on CT abd/pelvis   History of iron deficiency anemia    History of migraine    Hypercholesteremia    Hypertension    Internal hemorrhoids    Lateral meniscal tear 2014   Left   LVH (left ventricular hypertrophy) 03/03/2018   noted on EKG   Obesity    PMB (postmenopausal bleeding)    Pneumonia 2018   Right upper lower   PONV (postoperative nausea and vomiting)    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  Patient is not on anticoagulation prior to admission.  Goal of Therapy:  Heparin  level 0.3-0.7 units/ml Monitor  platelets by anticoagulation protocol: Yes   Plan:  Give 3200 units bolus x 1 Start heparin  infusion at 900 units/hr Check anti-Xa level in 8 hours and daily while on heparin  Continue to monitor H&H and platelets  Elspeth Sour, PharmD Clinical Pharmacist 07/17/2023 2:58 PM

## 2023-07-17 NOTE — Plan of Care (Signed)
   Problem: Education: Goal: Knowledge of General Education information will improve Description Including pain rating scale, medication(s)/side effects and non-pharmacologic comfort measures Outcome: Progressing

## 2023-07-17 NOTE — Assessment & Plan Note (Signed)
 Potassium 3.2.  On chlorthalidone . - Replete - Check Mag

## 2023-07-17 NOTE — H&P (Signed)
 History and Physical    [name] FMW:[id number] DOB: 17-Oct-1940 DOA: 07/17/2023  PCP: Shona Norleen PEDLAR, MD   Patient coming from: Home  I have personally briefly reviewed patient's old medical records in [facility]  Chief Complaint: Weakness  HPI: [name] is a 83 y.o. female with medical history significant for Parkinson's, diabetes mellitus, hypertension, gout. Patient was brought to the ED reports of generalized weakness, fever, body aches, and cough.  Symptoms started yesterday.  Per triage notes, patient was diagnosed with the flu yesterday. Patient denies chest pain, no urinary symptoms, no vomiting no diarrhea no abdominal pain.  No history of blood clots.  She has been bedbound over the past month, prior to this she was barely mobile sometimes using her wheelchair.  ED Course: Tmax 101.3.  Heart rate 84-92.  Respiratory rate 17-23.  Blood pressure systolic 1 43-164.  O2 sats 91 to 96% on room air. Lactic acid 0.9.  Potassium 3.2.  WBC 10. Head CT without acute abnormality, shows sequelae of chronic microvascular ischemia. CT chest abdomen pelvis with contrast incidental acute segmental PE in the lingula, also findings suggestive of bronchitis, and chronic diverticulosis. Heparin  drip started.  1 L bolus given.  Review of Systems: As per HPI all other systems reviewed and negative.  Past Medical History:  Diagnosis Date   Aortic atherosclerosis (HCC) 09/11/2016   noted on CXR   Arthritis    Baker's cyst, left 2014   Small, Left   Bilateral leg numbness    Cataract    Bilateral   Diabetes mellitus without complication (HCC)    Diverticulosis 10/09/2016   Noted on CT abd/pelvis   Gout    Grade I diastolic dysfunction 09/12/2016   Noted on ECHO   Hepatic cyst 10/09/2016   Noted on CT abd/pelvis   History of iron deficiency anemia    History of migraine    Hypercholesteremia    Hypertension    Internal hemorrhoids    Lateral meniscal tear 2014   Left    LVH (left ventricular hypertrophy) 03/03/2018   noted on EKG   Obesity    PMB (postmenopausal bleeding)    Pneumonia 2018   Right upper lower   PONV (postoperative nausea and vomiting)    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   Right 1.3 cm, Noted on CT Chest, pt unaware   Renal cyst 10/09/2016   Noted on CT abd/pelvis   Thoracic aortic aneurysm (HCC) 12/09/2016   ascending 4.0 cm, noted on CT chest, pt unaware    Past Surgical History:  Procedure Laterality Date   ABDOMINAL HYSTERECTOMY     CATARACT EXTRACTION Right 09/2010   CATARACT EXTRACTION Left 10/2010   COLONOSCOPY     DILATION AND CURETTAGE OF UTERUS     TOTAL KNEE ARTHROPLASTY Right 04/21/2018   Procedure: TOTAL KNEE ARTHROPLASTY;  Surgeon: Shari Sieving, MD;  Location: [facility];  Service: Orthopedics;  Laterality: Right;  Adductor Block     reports that she has never smoked. She has never used smokeless tobacco. She reports that she does not drink alcohol and does not use drugs.  No Known Allergies  Family History  Problem Relation Age of Onset   Kidney disease Mother    Diabetes Mother    Congestive Heart Failure Mother    Cancer Father        unsure of type   Multiple sclerosis Neg Hx    Parkinson's disease Neg Hx  Prior to Admission medications   Medication Sig Start Date End Date Taking? Authorizing Provider  acetaminophen  (TYLENOL ) 500 MG tablet Take 500 mg by mouth 2 (two) times daily as needed for moderate pain (pain score 4-6), headache or fever.    [provider]  allopurinol  (ZYLOPRIM ) 100 MG tablet Take 100 mg by mouth daily.    [provider]  amLODipine  (NORVASC ) 5 MG tablet Take 5 mg by mouth daily.     [provider]  carbidopa -levodopa  (SINEMET  IR) 25-100 MG tablet Take 1 tablet by mouth 4 (four) times daily. At 6 AM, 10 AM, 2 PM and 6 PM 02/09/23   Lomax, Amy, NP  cephALEXin  (KEFLEX ) 500 MG capsule Take 1 capsule (500 mg total) by mouth 4 (four) times daily.  05/15/23   Suzette Pac, MD  chlorthalidone  (HYGROTON ) 25 MG tablet Take 12.5 mg by mouth daily.    [provider]  Cholecalciferol (VITAMIN D -3 PO) Take 1 tablet by mouth daily.    [provider]  gabapentin  (NEURONTIN ) 300 MG capsule Take 1 capsule (300 mg total) by mouth 3 (three) times daily. Start with 1 capsule before bed for 5 days, may then add a second capsule at dinner for 5 days, if needed may add a third capsule in the morning for a total of three capsules a day. Patient taking differently: Take 300 mg by mouth 2 (two) times daily. 02/09/23   Lomax, Amy, NP  metFORMIN (GLUCOPHAGE) 500 MG tablet Take 500 mg by mouth daily with breakfast.    [provider]  olmesartan (BENICAR) 40 MG tablet Take 40 mg by mouth daily.    [provider]  simvastatin  (ZOCOR ) 40 MG tablet Take 40 mg by mouth every evening.    [provider]    Physical Exam: Vitals:   07/17/23 1400 07/17/23 1500 07/17/23 1512 07/17/23 1512  BP: (!) 159/76 (!) 164/85    Pulse: 84 91    Resp: (!) 23 12    Temp:   98.3 F (36.8 C) 98.3 F (36.8 C)  TempSrc:   Oral Oral  SpO2: 95% 96%    Weight:      Height:        Constitutional: Appears generally weak, calm, comfortable Vitals:   07/17/23 1400 07/17/23 1500 07/17/23 1512 07/17/23 1512  BP: (!) 159/76 (!) 164/85    Pulse: 84 91    Resp: (!) 23 12    Temp:   98.3 F (36.8 C) 98.3 F (36.8 C)  TempSrc:   Oral Oral  SpO2: 95% 96%    Weight:      Height:       Eyes: PERRL, lids and conjunctivae normal ENMT: Mucous membranes are moist.  Neck: normal, supple, no masses, no thyromegaly Respiratory:  Normal respiratory effort. No accessory muscle use.  Cardiovascular: Regular rate and rhythm,No extremity edema.  Extremities warm.    Abdomen: no tenderness, no masses palpated. No hepatosplenomegaly.   Musculoskeletal: no clubbing / cyanosis. No joint deformity upper and lower extremities.  Skin: no rashes,  lesions, ulcers. No induration Neurologic: No facial asymmetry, fair grip strength bilaterally, 2-3/4 strength to bilateral lower extremities- Chronic. Psychiatric: Normal judgment and insight. Alert and oriented x person and place. Normal mood.   Labs on Admission: I have personally reviewed following labs and imaging studies  CBC: Recent Labs  Lab 07/17/23 1246  WBC 10.0  NEUTROABS 8.4*  HGB 10.0*  HCT 30.7*  MCV 91.9  PLT  193   Basic Metabolic Panel: Recent Labs  Lab 07/17/23 1246  NA 140  K 3.2*  CL 101  CO2 27  GLUCOSE 108*  BUN 23  CREATININE 0.79  CALCIUM 8.8*   GFR: Estimated Creatinine Clearance: 47.5 mL/min (by C-G formula based on SCr of 0.79 mg/dL). Liver Function Tests: Recent Labs  Lab 07/17/23 1246  AST 12*  ALT 9  ALKPHOS 74  BILITOT 0.7  PROT 6.1*  ALBUMIN 2.7*   Coagulation Profile: Recent Labs  Lab 07/17/23 1246  INR 1.1   Radiological Exams on Admission: CT CHEST ABDOMEN PELVIS W CONTRAST Result Date: 07/17/2023 CLINICAL DATA:  Sepsis.  Mental status change.  Fever. EXAM: CT CHEST, ABDOMEN, AND PELVIS WITH CONTRAST TECHNIQUE: Multidetector CT imaging of the chest, abdomen and pelvis was performed following the standard protocol during bolus administration of intravenous contrast. RADIATION DOSE REDUCTION: This exam was performed according to the departmental dose-optimization program which includes automated exposure control, adjustment of the mA and/or kV according to patient size and/or use of iterative reconstruction technique. CONTRAST:  100mL OMNIPAQUE  IOHEXOL  300 MG/ML  SOLN COMPARISON:  Chest radiograph from earlier today. 12/09/2016 chest CT. 10/09/2016 CT chest, abdomen and pelvis. FINDINGS: CT CHEST FINDINGS Cardiovascular: Normal heart size. No significant pericardial effusion/thickening. Atherosclerotic nonaneurysmal thoracic aorta. Normal caliber pulmonary arteries. Acute segmental pulmonary embolism in the lingula (series 5/image  70). No more central PE. Mediastinum/Nodes: Hypodense 1.4 cm right thyroid  nodule. No follow-up imaging is recommended.Unremarkable esophagus. No pathologically enlarged axillary, mediastinal or hilar lymph nodes. Lungs/Pleura: No pneumothorax. No pleural effusion. No acute consolidative airspace disease, lung masses or significant pulmonary nodules. Mild diffuse bronchial wall thickening. Musculoskeletal: No aggressive appearing focal osseous lesions. Moderate thoracic spondylosis. CT ABDOMEN PELVIS FINDINGS Hepatobiliary: Normal liver size. Lobulated benign-appearing 4.6 cm posterior right liver cyst with additional scattered subcentimeter hypodense right liver lesions that are too small to characterize. Normal gallbladder with no radiopaque cholelithiasis. No biliary ductal dilatation. Pancreas: Normal, with no mass or duct dilation. Spleen: Normal size spleen. Subcentimeter hypodense superior splenic lesion is too small to characterize. No additional splenic lesions. Adrenals/Urinary Tract: Normal adrenals. No hydronephrosis. Numerous scattered simple bilateral renal cysts up to the 3.6 cm in the upper left kidney with numerous additional subcentimeter hypodense bilateral renal cortical lesions that are too small to characterize, for which no follow-up imaging is recommended. 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 Normal bladder. Stomach/Bowel: Normal non-distended stomach. Normal caliber small bowel with no small bowel wall thickening. Appendix not discretely visualized. Marked left colonic diverticulosis, most prominent in the sigmoid colon. Chronic wall thickening in the proximal sigmoid colon, which appears chronically adherent to the left ovary, not substantially changed from 10/09/2016 CT abdomen/pelvis study, favoring chronic diverticulosis. No acute large bowel wall thickening or acute pericolonic fat stranding. Vascular/Lymphatic: Atherosclerotic nonaneurysmal abdominal  aorta. Patent portal, splenic, hepatic and renal veins. No pathologically enlarged lymph nodes in the abdomen or pelvis. Reproductive: Status post hysterectomy, with no abnormal findings at the vaginal cuff. No adnexal mass. Other: No pneumoperitoneum, ascites or focal fluid collection. Musculoskeletal: No aggressive appearing focal osseous lesions. Moderate lumbar spondylosis. IMPRESSION: 1. Incidental acute segmental pulmonary embolism in the lingula. No more central PE. 2. No acute pulmonary parenchymal abnormality. Mild diffuse bronchial wall thickening, suggesting bronchitis. 3. No acute abnormality in the abdomen or pelvis. 4. Marked left colonic diverticulosis, most prominent in the sigmoid colon. Chronic wall thickening in the proximal sigmoid colon, which appears chronically adherent to  the left ovary, not substantially changed from 10/09/2016 CT abdomen/pelvis study, favoring chronic diverticulosis. 5. 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 6.  Aortic Atherosclerosis (ICD10-I70.0). Critical Value/emergent results were called by telephone at the time of interpretation on 07/17/2023 at 2:48 pm to provider [place] , who verbally acknowledged these results. Electronically Signed   By: Selinda DELENA Blue M.D.   On: 07/17/2023 14:50   CT Head Wo Contrast Result Date: 07/17/2023 CLINICAL DATA:  Mental status change, unknown cause. Recent diagnosis with influenza fever. EXAM: CT HEAD WITHOUT CONTRAST TECHNIQUE: Contiguous axial images were obtained from the base of the skull through the vertex without intravenous contrast. RADIATION DOSE REDUCTION: This exam was performed according to the departmental dose-optimization program which includes automated exposure control, adjustment of the mA and/or kV according to patient size and/or use of iterative reconstruction technique. COMPARISON:  CT head without contrast 05/15/2023 FINDINGS: Brain: No acute infarct, hemorrhage, or mass  lesion is present. Moderate atrophy and diffuse white matter disease is stable. Basal ganglia are stable. The ventricles are of normal size. No significant extraaxial fluid collection is present. Atherosclerotic calcifications are present at the cavernous internal carotid arteries bilaterally. No other significant edema is present. Midline structures are within normal limits. The brainstem and cerebellum are within normal limits. Vascular: Minimal atherosclerotic calcifications are present within the cavernous internal carotid arteries bilaterally. No hyperdense vessel is present. Skull: Calvarium is intact. Calvarium is intact. No focal lytic or blastic lesions are present. No significant extracranial soft tissue lesion is present. Sinuses/Orbits: Minimal mucosal thickening is present along the inferior right maxillary sinus and inferior right frontal sinus. No fluid levels are present. The paranasal sinuses and mastoid air cells are otherwise clear. Bilateral lens replacements are noted. Globes and orbits are otherwise unremarkable. IMPRESSION: 1. No acute intracranial abnormality or significant interval change. 2. Stable atrophy and diffuse white matter disease. This likely reflects the sequela of chronic microvascular ischemia. 3. Minimal right maxillary and frontal sinus disease. Electronically Signed   By: Lonni Necessary M.D.   On: 07/17/2023 14:37   DG Chest Port 1 View Result Date: 07/17/2023 CLINICAL DATA:  Fever.  Diagnosed with flu yesterday. EXAM: PORTABLE CHEST 1 VIEW COMPARISON:  05/15/2023 FINDINGS: Stable cardiomediastinal silhouette. Aortic atherosclerotic calcification. Low lung volumes accentuate pulmonary vascularity. Left basilar atelectasis. Otherwise no focal consolidation, pleural effusion, or pneumothorax. No displaced rib fractures. IMPRESSION: Low lung volumes with left basilar atelectasis. Electronically Signed   By: Norman Gatlin M.D.   On: 07/17/2023 12:36    EKG:  Independently reviewed.  Sinus rhythm, rate 88, QTc 429.  No significant change from prior.  Assessment/Plan Principal Problem:   Pulmonary embolism (HCC) Active Problems:   COVID-19 virus infection   Gout, unspecified   HYPOKALEMIA   Essential hypertension   Diabetes (HCC)   Hypercholesteremia   Assessment and Plan: * Pulmonary embolism (HCC) Pulmonary embolism in the setting of COVID infection, and new immobility over the past month.  Not hypoxic.  Patient with limited mobility-ambulating with wheelchair, but over the past month she has been bedbound.  No history of GI bleeds. -Heparin  drip started in ED -Obtain echocardiogram - Bilateral lower extremity venous Dopplers  COVID-19 virus infection Presenting with fever of 101.3, cough, weakness.  No dyspnea.  Not hypoxic-sats 91 to 96% on room air.  CT chest with findings suggestive of bronchitis.  Acute pulmonary embolism in the setting of COVID infection. -Paxlovid  -Obtain inflammatory panel -Albuterol  inhaler -Mucolytics -  Follow up UA  Hypercholesteremia Hold simvastatin   while on Paxlovid   Diabetes (HCC) - HgbA1c - SSI- S -Hold Metformin  Essential hypertension Stable. -Resume Norvasc  - Olmesartan and HCTZ held for contrast exposure  HYPOKALEMIA Potassium 3.2.  On chlorthalidone . - Replete - Check Mag  Gout, unspecified Resume allopurinol    DVT prophylaxis: heparin  Code Status: DNR- Patient confirmed DNR status,  with spouse and daughter at bedside. Family Communication: Spouse- Lynwood, and daughter- Philis at bedside Disposition Plan: ~ 1-2 days Consults called: None  Admission status:  Obs Tele    Author: Tully FORBES Carwin, MD 07/17/2023 4:17 PM  For on call review [email or web address].

## 2023-07-18 ENCOUNTER — Other Ambulatory Visit (HOSPITAL_COMMUNITY): Payer: Medicare HMO

## 2023-07-18 ENCOUNTER — Other Ambulatory Visit (HOSPITAL_COMMUNITY): Payer: Self-pay | Admitting: *Deleted

## 2023-07-18 ENCOUNTER — Observation Stay (HOSPITAL_COMMUNITY): Payer: Medicare HMO

## 2023-07-18 DIAGNOSIS — G9340 Encephalopathy, unspecified: Secondary | ICD-10-CM | POA: Diagnosis not present

## 2023-07-18 DIAGNOSIS — I82412 Acute embolism and thrombosis of left femoral vein: Secondary | ICD-10-CM | POA: Diagnosis not present

## 2023-07-18 DIAGNOSIS — U071 COVID-19: Secondary | ICD-10-CM | POA: Diagnosis not present

## 2023-07-18 DIAGNOSIS — I2699 Other pulmonary embolism without acute cor pulmonale: Secondary | ICD-10-CM | POA: Diagnosis not present

## 2023-07-18 DIAGNOSIS — I82432 Acute embolism and thrombosis of left popliteal vein: Secondary | ICD-10-CM | POA: Diagnosis not present

## 2023-07-18 LAB — GLUCOSE, CAPILLARY
Glucose-Capillary: 111 mg/dL — ABNORMAL HIGH (ref 70–99)
Glucose-Capillary: 176 mg/dL — ABNORMAL HIGH (ref 70–99)
Glucose-Capillary: 165 mg/dL — ABNORMAL HIGH (ref 70–99)
Glucose-Capillary: 174 mg/dL — ABNORMAL HIGH (ref 70–99)

## 2023-07-18 LAB — CBC
HCT: 32 % — ABNORMAL LOW (ref 36.0–46.0)
Hemoglobin: 9.9 g/dL — ABNORMAL LOW (ref 12.0–15.0)
MCH: 28.7 pg (ref 26.0–34.0)
MCHC: 30.9 g/dL (ref 30.0–36.0)
MCV: 92.8 fL (ref 80.0–100.0)
Platelets: 190 10*3/uL (ref 150–400)
RBC: 3.45 MIL/uL — ABNORMAL LOW (ref 3.87–5.11)
RDW: 16.4 % — ABNORMAL HIGH (ref 11.5–15.5)
WBC: 11.3 10*3/uL — ABNORMAL HIGH (ref 4.0–10.5)
nRBC: 0.2 % (ref 0.0–0.2)

## 2023-07-18 LAB — PROCALCITONIN: Procalcitonin: 0.57 ng/mL

## 2023-07-18 LAB — BASIC METABOLIC PANEL
Anion gap: 10 (ref 5–15)
BUN: 22 mg/dL (ref 8–23)
CO2: 26 mmol/L (ref 22–32)
Calcium: 8.6 mg/dL — ABNORMAL LOW (ref 8.9–10.3)
Chloride: 103 mmol/L (ref 98–111)
Creatinine, Ser: 0.75 mg/dL (ref 0.44–1.00)
GFR, Estimated: >60 mL/min (ref >60)
Glucose, Bld: 98 mg/dL (ref 70–99)
Potassium: 3.7 mmol/L (ref 3.5–5.1)
Sodium: 139 mmol/L (ref 135–145)

## 2023-07-18 LAB — HEPARIN LEVEL (UNFRACTIONATED)
Heparin Unfractionated: 0.62 [IU]/mL (ref 0.30–0.70)
Heparin Unfractionated: 0.85 [IU]/mL — ABNORMAL HIGH (ref 0.30–0.70)
Heparin Unfractionated: 0.77 [IU]/mL — ABNORMAL HIGH (ref 0.30–0.70)

## 2023-07-18 LAB — C-REACTIVE PROTEIN
CRP: 6.5 mg/dL — ABNORMAL HIGH (ref <1.0)
CRP: 12 mg/dL — ABNORMAL HIGH (ref <1.0)

## 2023-07-18 LAB — FERRITIN: Ferritin: 143 ng/mL (ref 11–307)

## 2023-07-18 MED ORDER — METHYLPREDNISOLONE SODIUM SUCC 125 MG IJ SOLR
60.0000 mg | Freq: Two times a day (BID) | INTRAMUSCULAR | Status: DC
  Administered 2023-07-18 – 2023-07-20 (×5): 60 mg via INTRAVENOUS
  Filled 2023-07-18 (×5): qty 2

## 2023-07-18 MED ORDER — GUAIFENESIN-DM 100-10 MG/5ML PO SYRP
5.0000 mL | ORAL_SOLUTION | ORAL | Status: DC | PRN
  Administered 2023-07-19 (×2): 5 mL via ORAL
  Filled 2023-07-18 (×2): qty 5

## 2023-07-18 MED ORDER — PHENOL 1.4 % MT LIQD
1.0000 | OROMUCOSAL | Status: DC | PRN
  Administered 2023-07-18: 1 via OROMUCOSAL
  Filled 2023-07-18: qty 177

## 2023-07-18 NOTE — Care Management Obs Status (Signed)
 MEDICARE OBSERVATION STATUS NOTIFICATION   Patient Details  Name: [name] MRN: [id number] Date of Birth: 13-Dec-1940   Medicare Observation Status Notification Given:  Yes    [name] 07/18/2023, 3:05 PM

## 2023-07-18 NOTE — Progress Notes (Signed)
 PHARMACY - ANTICOAGULATION CONSULT NOTE  Pharmacy Consult for heparin  Indication: pulmonary embolus  No Known Allergies  Patient Measurements: Height: 5\' 2"  (157.5 cm) Weight: 63.5 kg (139 lb 15.9 oz) IBW/kg (Calculated) : 50.1 Heparin  Dosing Weight: 63 kg  Vital Signs: Temp: 99.8 F (37.7 C) (02/10 1615) Temp Source: Oral (02/10 1615) BP: 135/82 (02/10 1615) Pulse Rate: 96 (02/10 1615)  Labs: Recent Labs    07/17/23 1246 07/17/23 2318 07/18/23 0425 07/18/23 1810  HGB 10.0*  --  9.9*  --   HCT 30.7*  --  32.0*  --   PLT 193  --  190  --   APTT 29  --   --   --   LABPROT 14.3  --   --   --   INR 1.1  --   --   --   HEPARINUNFRC  --  0.62 0.85* 0.77*  CREATININE 0.79  --  0.75  --    Estimated Creatinine Clearance: 47.5 mL/min (by C-G formula based on SCr of 0.75 mg/dL).  Medical History: Past Medical History:  Diagnosis Date   Aortic atherosclerosis (HCC) 09/11/2016   noted on CXR   Arthritis    Baker's cyst, left 2014   Small, Left   Bilateral leg numbness    Cataract    Bilateral   Diabetes mellitus without complication (HCC)    Diverticulosis 10/09/2016   Noted on CT abd/pelvis   Gout    Grade I diastolic dysfunction 09/12/2016   Noted on ECHO   Hepatic cyst 10/09/2016   Noted on CT abd/pelvis   History of iron deficiency anemia    History of migraine    Hypercholesteremia    Hypertension    Internal hemorrhoids    Lateral meniscal tear 2014   Left   LVH (left ventricular hypertrophy) 03/03/2018   noted on EKG   Obesity    PMB (postmenopausal bleeding)    Pneumonia 2018   Right upper lower   PONV (postoperative nausea and vomiting)    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   07/17/2023 at  9:00 AM   allopurinol  (ZYLOPRIM ) 100 MG tablet Take 100 mg by mouth daily.   07/16/2023 Morning   amLODipine  (NORVASC ) 5 MG tablet Take 5 mg by mouth daily.    07/16/2023 Bedtime   carbidopa -levodopa  (SINEMET  IR) 25-100 MG tablet Take 1 tablet by mouth 4 (four) times daily. At 6 AM, 10 AM, 2 PM and 6 PM 360 tablet 3 07/16/2023 Evening   chlorthalidone  (HYGROTON ) 25 MG tablet Take 12.5 mg by mouth daily.   07/16/2023 Morning   Cholecalciferol (VITAMIN D -3 PO) Take 1 tablet by mouth daily.   07/16/2023 Morning   gabapentin  (NEURONTIN ) 300 MG capsule Take 1 capsule (300 mg total) by mouth 3 (three) times daily. Start with 1 capsule before bed for 5 days, may then add a second capsule at dinner for 5 days, if needed may add a third capsule in the morning for a total of three capsules a  day. (Patient taking differently: Take 300 mg by mouth 2 (two) times daily.) 90 capsule 5 07/16/2023 Bedtime   metFORMIN (GLUCOPHAGE) 500 MG tablet Take 500 mg by mouth daily with breakfast.   07/16/2023 Bedtime   olmesartan (BENICAR) 40 MG tablet Take 40 mg by mouth daily.   07/16/2023 Morning   simvastatin  (ZOCOR ) 40 MG tablet Take 40 mg by mouth every evening.   07/16/2023 Bedtime   Assessment: Pharmacy consulted to dose heparin  in patient with pulmonary embolism.  Patient is not on anticoagulation prior to admission.  Goal of Therapy:  Heparin  level 0.3-0.7 units/ml Monitor platelets by anticoagulation protocol: Yes   Plan:  HL supra-therapeutic at 0.77 Decrease heparin  infusion to 650 units/hr Check next anti-Xa level in 8 hours  Continue to monitor H&H and platelets  Pansy Bogus, PharmD Pharmacy Resident  07/18/2023 7:38 PM

## 2023-07-18 NOTE — Progress Notes (Signed)
 Pt overall just doesn't feel well.  She is very weak with congested cough.Pt has difficulty swallowing which per daughter, is not new but has worsened this illness. She would benefit from SLP consult. No acute events overnight. [place] RN

## 2023-07-18 NOTE — Evaluation (Signed)
 Physical Therapy Evaluation Patient Details Name: [name] MRN: [id number] DOB: 1940-08-23 Today's Date: 07/18/2023  History of Present Illness  [name] is a 83 y.o. female with medical history significant for Parkinson's, diabetes mellitus, hypertension, gout.  Patient was brought to the ED reports of generalized weakness, fever, body aches, and cough.  Symptoms started yesterday.  Per triage notes, patient was diagnosed with the flu yesterday.  Patient denies chest pain, no urinary symptoms, no vomiting no diarrhea no abdominal pain.  No history of blood clots.  She has been bedbound over the past month, prior to this she was barely mobile sometimes using her wheelchair.   Clinical Impression  Patient demonstrates slow labored movement for sitting up at bedside with c/o increased pain LLE with movement and pressure.  Patient unable to maintain sitting balance with frequent leaning/falling to the right and backwards, unable to stand due to weakness and required Max assist for repositioning when put back to bed.  Patient will benefit from continued skilled physical therapy in hospital and recommended venue below to increase strength, balance, endurance for safe ADLs and gait.          If plan is discharge home, recommend the following: A lot of help with bathing/dressing/bathroom;A lot of help with walking and/or transfers;Help with stairs or ramp for entrance;Assistance with cooking/housework   Can travel by private vehicle   No    Equipment Recommendations None recommended by PT  Recommendations for Other Services       Functional Status Assessment Patient has had a recent decline in their functional status and demonstrates the ability to make significant improvements in function in a reasonable and predictable amount of time.     Precautions / Restrictions Precautions Precautions: Fall Restrictions Weight Bearing Restrictions Per Provider Order: No      Mobility   Bed Mobility Overal bed mobility: Needs Assistance Bed Mobility: Supine to Sit, Sit to Supine     Supine to sit: Max assist Sit to supine: Max assist   General bed mobility comments: slow labored movement with c/o increased pain LLE    Transfers                        Ambulation/Gait                  Stairs            Wheelchair Mobility     Tilt Bed    Modified Rankin (Stroke Patients Only)       Balance Overall balance assessment: Needs assistance Sitting-balance support: Feet supported, No upper extremity supported Sitting balance-Leahy Scale: Poor Sitting balance - Comments: seated at EOB                                     Pertinent Vitals/Pain Pain Assessment Pain Assessment: Faces Faces Pain Scale: Hurts even more Pain Location: LLE with movement, pressure Pain Descriptors / Indicators: Grimacing, Guarding, Sharp, Discomfort Pain Intervention(s): Limited activity within patient's tolerance, Monitored during session, Repositioned    Home Living Family/patient expects to be discharged to:: Private residence Living Arrangements: Spouse/significant other;Children Available Help at Discharge: Family Type of Home: House Home Access: Stairs to enter Entrance Stairs-Rails: Doctor, general practice of Steps: 2   Home Layout: One level Home Equipment: Control and instrumentation engineer (2 wheels);Wheelchair - manual      Prior Function  Prior Level of Function : Needs assist       Physical Assist : Mobility (physical);ADLs (physical) Mobility (physical): Bed mobility;Transfers;Gait;Stairs   Mobility Comments: Assisted for transfers, uses wheelchair for mobility, non-ambulatory x 6 months ADLs Comments: Assisted by family     Extremity/Trunk Assessment   Upper Extremity Assessment Upper Extremity Assessment: Generalized weakness    Lower Extremity Assessment Lower Extremity Assessment: Generalized  weakness    Cervical / Trunk Assessment Cervical / Trunk Assessment: Kyphotic  Communication   Communication Communication: No apparent difficulties  Cognition Arousal: Alert Behavior During Therapy: WFL for tasks assessed/performed Overall Cognitive Status: Within Functional Limits for tasks assessed                                          General Comments      Exercises     Assessment/Plan    PT Assessment Patient needs continued PT services  PT Problem List Decreased strength;Decreased activity tolerance;Decreased balance;Decreased mobility       PT Treatment Interventions DME instruction;Gait training;Functional mobility training;Therapeutic activities;Balance training;Patient/family education;Wheelchair mobility training    PT Goals (Current goals can be found in the Care Plan section)  Acute Rehab PT Goals Patient Stated Goal: return home PT Goal Formulation: With patient/family Time For Goal Achievement: 08/01/23 Potential to Achieve Goals: Good    Frequency Min 3X/week     Co-evaluation               AM-PAC PT "6 Clicks" Mobility  Outcome Measure Help needed turning from your back to your side while in a flat bed without using bedrails?: A Lot Help needed moving from lying on your back to sitting on the side of a flat bed without using bedrails?: A Lot Help needed moving to and from a bed to a chair (including a wheelchair)?: Total Help needed standing up from a chair using your arms (e.g., wheelchair or bedside chair)?: Total Help needed to walk in hospital room?: Total Help needed climbing 3-5 steps with a railing? : Total 6 Click Score: 8    End of Session   Activity Tolerance: Patient tolerated treatment well;Patient limited by fatigue Patient left: in bed;with call bell/phone within reach;with family/visitor present Nurse Communication: Mobility status PT Visit Diagnosis: Unsteadiness on feet (R26.81);Other abnormalities of  gait and mobility (R26.89);Muscle weakness (generalized) (M62.81)    Time: 8119-1478 PT Time Calculation (min) (ACUTE ONLY): 27 min   Charges:   PT Evaluation $PT Eval Moderate Complexity: 1 Mod PT Treatments $Therapeutic Activity: 23-37 mins PT General Charges $$ ACUTE PT VISIT: 1 Visit         3:27 PM, 07/18/23 Walton Guppy, MPT Physical Therapist with [facility] 336 [phone] office [phone] mobile phone

## 2023-07-18 NOTE — Plan of Care (Signed)
  Problem: Education: Goal: Knowledge of risk factors and measures for prevention of condition will improve Outcome: Not Progressing   Problem: Respiratory: Goal: Complications related to the disease process, condition or treatment will be avoided or minimized Outcome: Not Progressing   Problem: Education: Goal: Knowledge of General Education information will improve Description: Including pain rating scale, medication(s)/side effects and non-pharmacologic comfort measures Outcome: Not Progressing

## 2023-07-18 NOTE — Hospital Course (Addendum)
83 y.o. female with medical history significant for Parkinson's, diabetes mellitus, hypertension, gout. Patient was brought to the ED reports of generalized weakness, fever, body aches, and cough.  Symptoms started the day before admisison.   Patient denies chest pain, no urinary symptoms, no vomiting no diarrhea no abdominal pain.  No history of blood clots.  She has been bedbound over the past month, prior to this she was barely mobile sometimes using her wheelchair.   ED Course: Tmax 101.3.  Heart rate 84-92.  Respiratory rate 17-23.  Blood pressure systolic 1 43-164.  O2 sats 91 to 96% on room air. Lactic acid 0.9.  Potassium 3.2.  WBC 10. Head CT without acute abnormality, shows sequelae of chronic microvascular ischemia. CT chest abdomen pelvis with contrast incidental acute segmental PE in the lingula, also findings suggestive of bronchitis, and chronic diverticulosis. Heparin drip started.  1 L bolus given.

## 2023-07-18 NOTE — Progress Notes (Signed)
 PHARMACY - ANTICOAGULATION CONSULT NOTE  Pharmacy Consult for heparin  Indication: pulmonary embolus  No Known Allergies  Patient Measurements: Height: 5\' 2"  (157.5 cm) Weight: 63.5 kg (139 lb 15.9 oz) IBW/kg (Calculated) : 50.1 Heparin  Dosing Weight: 63 kg  Vital Signs: Temp: 98.2 F (36.8 C) (02/10 0432) Temp Source: Oral (02/10 0432) BP: 162/86 (02/10 0432) Pulse Rate: 95 (02/10 0432)  Labs: Recent Labs    07/17/23 1246 07/17/23 2318 07/18/23 0425  HGB 10.0*  --  9.9*  HCT 30.7*  --  32.0*  PLT 193  --  190  APTT 29  --   --   LABPROT 14.3  --   --   INR 1.1  --   --   HEPARINUNFRC  --  0.62 0.85*  CREATININE 0.79  --  0.75    Estimated Creatinine Clearance: 47.5 mL/min (by C-G formula based on SCr of 0.75 mg/dL).   Medical History: Past Medical History:  Diagnosis Date   Aortic atherosclerosis (HCC) 09/11/2016   noted on CXR   Arthritis    Baker's cyst, left 2014   Small, Left   Bilateral leg numbness    Cataract    Bilateral   Diabetes mellitus without complication (HCC)    Diverticulosis 10/09/2016   Noted on CT abd/pelvis   Gout    Grade I diastolic dysfunction 09/12/2016   Noted on ECHO   Hepatic cyst 10/09/2016   Noted on CT abd/pelvis   History of iron deficiency anemia    History of migraine    Hypercholesteremia    Hypertension    Internal hemorrhoids    Lateral meniscal tear 2014   Left   LVH (left ventricular hypertrophy) 03/03/2018   noted on EKG   Obesity    PMB (postmenopausal bleeding)    Pneumonia 2018   Right upper lower   PONV (postoperative nausea and vomiting)    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   07/17/2023 at  9:00 AM   allopurinol  (ZYLOPRIM ) 100 MG tablet Take 100 mg by mouth daily.   07/16/2023 Morning   amLODipine  (NORVASC ) 5 MG tablet Take 5 mg by mouth daily.    07/16/2023 Bedtime   carbidopa -levodopa  (SINEMET  IR) 25-100 MG tablet Take 1 tablet by mouth 4 (four) times daily. At 6 AM, 10 AM, 2 PM and 6 PM 360 tablet 3 07/16/2023 Evening   chlorthalidone  (HYGROTON ) 25 MG tablet Take 12.5 mg by mouth daily.   07/16/2023 Morning   Cholecalciferol (VITAMIN D -3 PO) Take 1 tablet by mouth daily.   07/16/2023 Morning   gabapentin  (NEURONTIN ) 300 MG capsule Take 1 capsule (300 mg total) by mouth 3 (three) times daily. Start with 1 capsule before bed for 5 days, may then add a second capsule at dinner for 5 days, if needed may add a third capsule in the morning for a total of three capsules a day. (Patient taking differently: Take 300 mg by mouth 2 (two) times daily.) 90 capsule 5 07/16/2023 Bedtime   metFORMIN (  GLUCOPHAGE) 500 MG tablet Take 500 mg by mouth daily with breakfast.   07/16/2023 Bedtime   olmesartan (BENICAR) 40 MG tablet Take 40 mg by mouth daily.   07/16/2023 Morning   simvastatin  (ZOCOR ) 40 MG tablet Take 40 mg by mouth every evening.   07/16/2023 Bedtime    Assessment: Pharmacy consulted to dose heparin  in patient with pulmonary embolism.  Patient is not on anticoagulation prior to admission.  HL 0.85- supratherapeutic Hgb 9.9 D-Dimer > 20  Goal of Therapy:  Heparin  level 0.3-0.7 units/ml Monitor platelets by anticoagulation protocol: Yes   Plan:  Decrease heparin  infusion to 750 units/hr Check anti-Xa level in 8 hours and daily Continue to monitor H&H and platelets   Cliffton Dama, PharmD Clinical Pharmacist 07/18/2023 8:05 AM

## 2023-07-18 NOTE — Progress Notes (Signed)
 PROGRESS NOTE  JENYFER KOVAL EAV:[id number] DOB: 03-31-1941 DOA: 07/17/2023 PCP: Omie Bickers, MD  Brief History:   83 y.o. female with medical history significant for Parkinson's, diabetes mellitus, hypertension, gout. Patient was brought to the ED reports of generalized weakness, fever, body aches, and cough.  Symptoms started yesterday.   Patient denies chest pain, no urinary symptoms, no vomiting no diarrhea no abdominal pain.  No history of blood clots.  She has been bedbound over the past month, prior to this she was barely mobile sometimes using her wheelchair.   ED Course: Tmax 101.3.  Heart rate 84-92.  Respiratory rate 17-23.  Blood pressure systolic 1 43-164.  O2 sats 91 to 96% on room air. Lactic acid 0.9.  Potassium 3.2.  WBC 10. Head CT without acute abnormality, shows sequelae of chronic microvascular ischemia. CT chest abdomen pelvis with contrast incidental acute segmental PE in the lingula, also findings suggestive of bronchitis, and chronic diverticulosis. Heparin  drip started.  1 L bolus given.   Assessment/Plan: Acute pulmonary embolism/DVT lower extremities -Continue IV heparin  -Patient has limited mobility and COVID-19 -Echocardiogram  COVID-19 infection -Continue Paxlovid  -Start IV Solu-Medrol  -Patient saturation down to 90% on room air -Follow-up inflammatory markers  Hyperlipidemia -Holding simvastatin  while on Paxlovid   Diabetes mellitus type 2, controlled -07/17/2023 hemoglobin A1c 5.2 -Hold metformin  Hypertension -Restart amlodipine  -Olmesartan hydrochlorothiazide  Hypokalemia -Repleting -Check magnesium   Parkinson's disease -Continue Sinemet  -Outpatient neurology follow-up        Family Communication:   spouse at bedside 2/10  Consultants:  none  Code Status:  DNR  DVT Prophylaxis:  IV Heparin    Procedures: As Listed in Progress Note Above  Antibiotics: None        Subjective: Patient denies fevers,  chills, headache, chest pain, dyspnea, nausea, vomiting, diarrhea, abdominal pain, dysuria, hematuria,    Objective: Vitals:   07/17/23 1933 07/17/23 1935 07/18/23 0432 07/18/23 1000  BP: (!) 165/107  (!) 162/86 (!) 151/81  Pulse: 97  95 97  Resp: (!) 22  17   Temp: (!) 100.4 F (38 C)  98.2 F (36.8 C) 100.2 F (37.9 C)  TempSrc: Oral  Oral Oral  SpO2: 96% 96% 94%   Weight:      Height:        Intake/Output Summary (Last 24 hours) at 07/18/2023 1231 Last data filed at 07/18/2023 0353 Gross per 24 hour  Intake 144.99 ml  Output 500 ml  Net -355.01 ml   Weight change:  Exam:  General:  Pt is alert, follows commands appropriately, not in acute distress HEENT: No icterus, No thrush, No neck mass, Maud/AT Cardiovascular: RRR, S1/S2, no rubs, no gallops Respiratory: scattered bilateral rales. No wheeze Abdomen: Soft/+BS, non tender, non distended, no guarding Extremities: trace LE edema, No lymphangitis, No petechiae, No rashes, no synovitis   Data Reviewed: I have personally reviewed following labs and imaging studies Basic Metabolic Panel: Recent Labs  Lab 07/17/23 1246 07/17/23 1621 07/18/23 0425  NA 140  --  139  K 3.2*  --  3.7  CL 101  --  103  CO2 27  --  26  GLUCOSE 108*  --  98  BUN 23  --  22  CREATININE 0.79  --  0.75  CALCIUM 8.8*  --  8.6*  MG  --  1.7  --    Liver Function Tests: Recent Labs  Lab 07/17/23 1246  AST 12*  ALT 9  ALKPHOS 74  BILITOT 0.7  PROT 6.1*  ALBUMIN 2.7*   No results for input(s): "LIPASE", "AMYLASE" in the last 168 hours. No results for input(s): "AMMONIA" in the last 168 hours. Coagulation Profile: Recent Labs  Lab 07/17/23 1246  INR 1.1   CBC: Recent Labs  Lab 07/17/23 1246 07/18/23 0425  WBC 10.0 11.3*  NEUTROABS 8.4*  --   HGB 10.0* 9.9*  HCT 30.7* 32.0*  MCV 91.9 92.8  PLT 193 190   Cardiac Enzymes: No results for input(s): "CKTOTAL", "CKMB", "CKMBINDEX", "TROPONINI" in the last 168  hours. BNP: Invalid input(s): "POCBNP" CBG: Recent Labs  Lab 07/17/23 2056 07/18/23 0812 07/18/23 1215  GLUCAP 107* 111* 176*   HbA1C: Recent Labs    07/17/23 1621  HGBA1C 5.2   Urine analysis:    Component Value Date/Time   COLORURINE YELLOW 07/17/2023 1335   APPEARANCEUR CLEAR 07/17/2023 1335   LABSPEC 1.027 07/17/2023 1335   PHURINE 7.0 07/17/2023 1335   GLUCOSEU NEGATIVE 07/17/2023 1335   HGBUR MODERATE (A) 07/17/2023 1335   HGBUR negative 10/17/2008 1020   BILIRUBINUR NEGATIVE 07/17/2023 1335   KETONESUR NEGATIVE 07/17/2023 1335   PROTEINUR 100 (A) 07/17/2023 1335   UROBILINOGEN 1.0 10/17/2008 1020   NITRITE NEGATIVE 07/17/2023 1335   LEUKOCYTESUR NEGATIVE 07/17/2023 1335   Sepsis Labs: @LABRCNTIP (procalcitonin:4,lacticidven:4) ) Recent Results (from the past 240 hours)  Resp panel by RT-PCR (RSV, Flu A&B, Covid) Anterior Nasal Swab     Status: Abnormal   Collection Time: 07/17/23 12:15 PM   Specimen: Anterior Nasal Swab  Result Value Ref Range Status   SARS Coronavirus 2 by RT PCR POSITIVE (A) NEGATIVE Final    Comment: (NOTE) SARS-CoV-2 target nucleic acids are DETECTED.  The SARS-CoV-2 RNA is generally detectable in upper respiratory specimens during the acute phase of infection. Positive results are indicative of the presence of the identified virus, but do not rule out bacterial infection or co-infection with other pathogens not detected by the test. Clinical correlation with patient history and other diagnostic information is necessary to determine patient infection status. The expected result is Negative.  Fact Sheet for Patients: BloggerCourse.com  Fact Sheet for Healthcare Providers: SeriousBroker.it  This test is not yet approved or cleared by the [place]  FDA and  has been authorized for detection and/or diagnosis of SARS-CoV-2 by FDA under an Emergency Use Authorization (EUA).  This  EUA will remain in effect (meaning this test can be used) for the duration of  the COVID-19 declaration under Section 564(b)(1) of the A ct, 21 U.S.C. section 360bbb-3(b)(1), unless the authorization is terminated or revoked sooner.     Influenza A by PCR NEGATIVE NEGATIVE Final   Influenza B by PCR NEGATIVE NEGATIVE Final    Comment: (NOTE) The Xpert Xpress SARS-CoV-2/FLU/RSV plus assay is intended as an aid in the diagnosis of influenza from Nasopharyngeal swab specimens and should not be used as a sole basis for treatment. Nasal washings and aspirates are unacceptable for Xpert Xpress SARS-CoV-2/FLU/RSV testing.  Fact Sheet for Patients: BloggerCourse.com  Fact Sheet for Healthcare Providers: SeriousBroker.it  This test is not yet approved or cleared by the [place]  FDA and has been authorized for detection and/or diagnosis of SARS-CoV-2 by FDA under an Emergency Use Authorization (EUA). This EUA will remain in effect (meaning this test can be used) for the duration of the COVID-19 declaration under Section 564(b)(1) of the Act, 21 U.S.C. section 360bbb-3(b)(1), unless the authorization is terminated or revoked.  Resp Syncytial Virus by PCR NEGATIVE NEGATIVE Final    Comment: (NOTE) Fact Sheet for Patients: BloggerCourse.com  Fact Sheet for Healthcare Providers: SeriousBroker.it  This test is not yet approved or cleared by the [place]  FDA and has been authorized for detection and/or diagnosis of SARS-CoV-2 by FDA under an Emergency Use Authorization (EUA). This EUA will remain in effect (meaning this test can be used) for the duration of the COVID-19 declaration under Section 564(b)(1) of the Act, 21 U.S.C. section 360bbb-3(b)(1), unless the authorization is terminated or revoked.  Performed at [facility], [address], [place], [place] [address]    Blood Culture (routine x 2)     Status: None (Preliminary result)   Collection Time: 07/17/23 12:46 PM   Specimen: BLOOD RIGHT ARM  Result Value Ref Range Status   Specimen Description   Final    BLOOD RIGHT ARM BOTTLES DRAWN AEROBIC AND ANAEROBIC   Special Requests Blood Culture adequate volume  Final   Culture   Final    NO GROWTH < 24 HOURS Performed at [facility], [address] [place], [place] [address]    Report Status PENDING  Incomplete  Blood Culture (routine x 2)     Status: None (Preliminary result)   Collection Time: 07/17/23 12:46 PM   Specimen: BLOOD LEFT HAND  Result Value Ref Range Status   Specimen Description   Final    BLOOD LEFT HAND BOTTLES DRAWN AEROBIC AND ANAEROBIC   Special Requests Blood Culture adequate volume  Final   Culture   Final    NO GROWTH < 24 HOURS Performed at [facility], [address], [place], [place] [address]    Report Status PENDING  Incomplete     Scheduled Meds:  allopurinol   100 mg Oral Daily   amLODipine   5 mg Oral Daily   carbidopa -levodopa   1 tablet Oral QID   gabapentin   300 mg Oral BID   guaiFENesin -dextromethorphan   15 mL Oral Q8H   insulin  aspart  0-5 Units Subcutaneous QHS   insulin  aspart  0-9 Units Subcutaneous TID WC   nirmatrelvir /ritonavir  (renal dosing)  2 tablet Oral BID   Continuous Infusions:  heparin  750 Units/hr (07/18/23 1109)    Procedures/Studies: US  Venous Img Lower Bilateral (DVT) Result Date: 07/18/2023 CLINICAL DATA:  Pulmonary embolism. Evaluate for lower extremity DVT. EXAM: BILATERAL LOWER EXTREMITY VENOUS DOPPLER ULTRASOUND TECHNIQUE: Gray-scale sonography with graded compression, as well as color Doppler and duplex ultrasound were performed to evaluate the lower extremity deep venous systems from the level of the common femoral vein and including the common femoral, femoral, profunda femoral, popliteal and calf veins including the posterior tibial, peroneal and gastrocnemius veins when  visible. The superficial great saphenous vein was also interrogated. Spectral Doppler was utilized to evaluate flow at rest and with distal augmentation maneuvers in the common femoral, femoral and popliteal veins. COMPARISON:  None Available. FINDINGS: RIGHT LOWER EXTREMITY Common Femoral Vein: No evidence of thrombus. Normal compressibility, respiratory phasicity and response to augmentation. Saphenofemoral Junction: Small amount of echogenic thrombus at the saphenofemoral junction. Profunda Femoral Vein: No evidence of thrombus. Normal compressibility and flow on color Doppler imaging. Femoral Vein: No evidence of thrombus. Normal compressibility, respiratory phasicity and response to augmentation. Popliteal Vein: No evidence of thrombus. Normal compressibility, respiratory phasicity and response to augmentation. Calf Veins: Visualized right deep calf veins are patent without thrombus. Other Findings:  None. LEFT LOWER EXTREMITY Common Femoral Vein: Positive for thrombus. Echogenic thrombus in left common  femoral vein. Saphenofemoral Junction: No evidence of thrombus. Normal compressibility and flow on color Doppler imaging. Profunda Femoral Vein: Positive for thrombus. Femoral Vein: Positive for thrombus. Popliteal Vein: Positive for thrombus Calf Veins: Limited evaluation. Other Findings:  None. IMPRESSION: 1. Positive for DVT in the left lower extremity. Thrombus involving the left common femoral vein, profunda femoral vein, femoral vein and popliteal vein. 2. Small amount of thrombus at the right saphenofemoral junction. Electronically Signed   By: Elene Griffes M.D.   On: 07/18/2023 11:41   CT CHEST ABDOMEN PELVIS W CONTRAST Result Date: 07/17/2023 CLINICAL DATA:  Sepsis.  Mental status change.  Fever. EXAM: CT CHEST, ABDOMEN, AND PELVIS WITH CONTRAST TECHNIQUE: Multidetector CT imaging of the chest, abdomen and pelvis was performed following the standard protocol during bolus administration of intravenous  contrast. RADIATION DOSE REDUCTION: This exam was performed according to the departmental dose-optimization program which includes automated exposure control, adjustment of the mA and/or kV according to patient size and/or use of iterative reconstruction technique. CONTRAST:  OMNIPAQUE  IOHEXOL  300 MG/ML  SOLN COMPARISON:  Chest radiograph from earlier today. 12/09/2016 chest CT. 10/09/2016 CT chest, abdomen and pelvis. FINDINGS: CT CHEST FINDINGS Cardiovascular: Normal heart size. No significant pericardial effusion/thickening. Atherosclerotic nonaneurysmal thoracic aorta. Normal caliber pulmonary arteries. Acute segmental pulmonary embolism in the lingula (series 5/image 70). No more central PE. Mediastinum/Nodes: Hypodense 1.4 cm right thyroid  nodule. No follow-up imaging is recommended.Unremarkable esophagus. No pathologically enlarged axillary, mediastinal or hilar lymph nodes. Lungs/Pleura: No pneumothorax. No pleural effusion. No acute consolidative airspace disease, lung masses or significant pulmonary nodules. Mild diffuse bronchial wall thickening. Musculoskeletal: No aggressive appearing focal osseous lesions. Moderate thoracic spondylosis. CT ABDOMEN PELVIS FINDINGS Hepatobiliary: Normal liver size. Lobulated benign-appearing 4.6 cm posterior right liver cyst with additional scattered subcentimeter hypodense right liver lesions that are too small to characterize. Normal gallbladder with no radiopaque cholelithiasis. No biliary ductal dilatation. Pancreas: Normal, with no mass or duct dilation. Spleen: Normal size spleen. Subcentimeter hypodense superior splenic lesion is too small to characterize. No additional splenic lesions. Adrenals/Urinary Tract: Normal adrenals. No hydronephrosis. Numerous scattered simple bilateral renal cysts up to the 3.6 cm in the upper left kidney with numerous additional subcentimeter hypodense bilateral renal cortical lesions that are too small to characterize, for  which no follow-up imaging is recommended. 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 Normal bladder. Stomach/Bowel: Normal non-distended stomach. Normal caliber small bowel with no small bowel wall thickening. Appendix not discretely visualized. Marked left colonic diverticulosis, most prominent in the sigmoid colon. Chronic wall thickening in the proximal sigmoid colon, which appears chronically adherent to the left ovary, not substantially changed from 10/09/2016 CT abdomen/pelvis study, favoring chronic diverticulosis. No acute large bowel wall thickening or acute pericolonic fat stranding. Vascular/Lymphatic: Atherosclerotic nonaneurysmal abdominal aorta. Patent portal, splenic, hepatic and renal veins. No pathologically enlarged lymph nodes in the abdomen or pelvis. Reproductive: Status post hysterectomy, with no abnormal findings at the vaginal cuff. No adnexal mass. Other: No pneumoperitoneum, ascites or focal fluid collection. Musculoskeletal: No aggressive appearing focal osseous lesions. Moderate lumbar spondylosis. IMPRESSION: 1. Incidental acute segmental pulmonary embolism in the lingula. No more central PE. 2. No acute pulmonary parenchymal abnormality. Mild diffuse bronchial wall thickening, suggesting bronchitis. 3. No acute abnormality in the abdomen or pelvis. 4. Marked left colonic diverticulosis, most prominent in the sigmoid colon. Chronic wall thickening in the proximal sigmoid colon, which appears chronically adherent to the left ovary, not substantially changed from 10/09/2016  CT abdomen/pelvis study, favoring chronic diverticulosis. 5. 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 6.  Aortic Atherosclerosis (ICD10-I70.0). Critical Value/emergent results were called by telephone at the time of interpretation on 07/17/2023 at 2:48 pm to provider [facility] , who verbally acknowledged these results. Electronically Signed   By: Levell Reach  M.D.   On: 07/17/2023 14:50   CT Head Wo Contrast Result Date: 07/17/2023 CLINICAL DATA:  Mental status change, unknown cause. Recent diagnosis with influenza fever. EXAM: CT HEAD WITHOUT CONTRAST TECHNIQUE: Contiguous axial images were obtained from the base of the skull through the vertex without intravenous contrast. RADIATION DOSE REDUCTION: This exam was performed according to the departmental dose-optimization program which includes automated exposure control, adjustment of the mA and/or kV according to patient size and/or use of iterative reconstruction technique. COMPARISON:  CT head without contrast 05/15/2023 FINDINGS: Brain: No acute infarct, hemorrhage, or mass lesion is present. Moderate atrophy and diffuse white matter disease is stable. Basal ganglia are stable. The ventricles are of normal size. No significant extraaxial fluid collection is present. Atherosclerotic calcifications are present at the cavernous internal carotid arteries bilaterally. No other significant edema is present. Midline structures are within normal limits. The brainstem and cerebellum are within normal limits. Vascular: Minimal atherosclerotic calcifications are present within the cavernous internal carotid arteries bilaterally. No hyperdense vessel is present. Skull: Calvarium is intact. Calvarium is intact. No focal lytic or blastic lesions are present. No significant extracranial soft tissue lesion is present. Sinuses/Orbits: Minimal mucosal thickening is present along the inferior right maxillary sinus and inferior right frontal sinus. No fluid levels are present. The paranasal sinuses and mastoid air cells are otherwise clear. Bilateral lens replacements are noted. Globes and orbits are otherwise unremarkable. IMPRESSION: 1. No acute intracranial abnormality or significant interval change. 2. Stable atrophy and diffuse white matter disease. This likely reflects the sequela of chronic microvascular ischemia. 3. Minimal  right maxillary and frontal sinus disease. Electronically Signed   By: Audree Leas M.D.   On: 07/17/2023 14:37   DG Chest Port 1 View Result Date: 07/17/2023 CLINICAL DATA:  Fever.  Diagnosed with flu yesterday. EXAM: PORTABLE CHEST 1 VIEW COMPARISON:  05/15/2023 FINDINGS: Stable cardiomediastinal silhouette. Aortic atherosclerotic calcification. Low lung volumes accentuate pulmonary vascularity. Left basilar atelectasis. Otherwise no focal consolidation, pleural effusion, or pneumothorax. No displaced rib fractures. IMPRESSION: Low lung volumes with left basilar atelectasis. Electronically Signed   By: Rozell Cornet M.D.   On: 07/17/2023 12:36    Demaris Fillers, DO  [facility]  If 7PM-7AM, please contact night-coverage [email or web address] Password [facility] 07/18/2023, 12:31 PM   LOS: 0 days

## 2023-07-18 NOTE — Progress Notes (Signed)
   07/18/23 0932  TOC Brief Assessment  Insurance and Status Reviewed  Patient has primary care physician Yes  Home environment has been reviewed from home  Prior level of function: independent  Prior/Current Home Services No current home services  Social Drivers of Health Review SDOH reviewed no interventions necessary  Readmission risk has been reviewed Yes  Transition of care needs no transition of care needs at this time     Transition of Care Department [facility]) has reviewed patient and no TOC needs have been identified at this time. We will continue to monitor patient advancement through interdisciplinary progression rounds. If new patient transition needs arise, please place a TOC consult.

## 2023-07-18 NOTE — Progress Notes (Signed)
 PHARMACY - ANTICOAGULATION CONSULT NOTE  Pharmacy Consult for heparin  Indication: pulmonary embolus  No Known Allergies  Patient Measurements: Height: 5\' 2"  (157.5 cm) Weight: 63.5 kg (139 lb 15.9 oz) IBW/kg (Calculated) : 50.1 Heparin  Dosing Weight: 63 kg  Vital Signs: Temp: 100.4 F (38 C) (02/09 1933) Temp Source: Oral (02/09 1933) BP: 165/107 (02/09 1933) Pulse Rate: 97 (02/09 1933)  Labs: Recent Labs    07/17/23 1246 07/17/23 2318  HGB 10.0*  --   HCT 30.7*  --   PLT 193  --   APTT 29  --   LABPROT 14.3  --   INR 1.1  --   HEPARINUNFRC  --  0.62  CREATININE 0.79  --     Estimated Creatinine Clearance: 47.5 mL/min (by C-G formula based on SCr of 0.79 mg/dL).   Medical History: Past Medical History:  Diagnosis Date   Aortic atherosclerosis (HCC) 09/11/2016   noted on CXR   Arthritis    Baker's cyst, left 2014   Small, Left   Bilateral leg numbness    Cataract    Bilateral   Diabetes mellitus without complication (HCC)    Diverticulosis 10/09/2016   Noted on CT abd/pelvis   Gout    Grade I diastolic dysfunction 09/12/2016   Noted on ECHO   Hepatic cyst 10/09/2016   Noted on CT abd/pelvis   History of iron deficiency anemia    History of migraine    Hypercholesteremia    Hypertension    Internal hemorrhoids    Lateral meniscal tear 2014   Left   LVH (left ventricular hypertrophy) 03/03/2018   noted on EKG   Obesity    PMB (postmenopausal bleeding)    Pneumonia 2018   Right upper lower   PONV (postoperative nausea and vomiting)    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   07/17/2023 at  9:00 AM   allopurinol  (ZYLOPRIM ) 100 MG tablet Take 100 mg by mouth daily.   07/16/2023 Morning   amLODipine  (NORVASC ) 5 MG tablet Take 5 mg by mouth daily.    07/16/2023 Bedtime   carbidopa -levodopa  (SINEMET  IR) 25-100 MG tablet Take 1 tablet by mouth 4 (four) times daily. At 6 AM, 10 AM, 2 PM and 6 PM 360 tablet 3 07/16/2023 Evening   chlorthalidone  (HYGROTON ) 25 MG tablet Take 12.5 mg by mouth daily.   07/16/2023 Morning   Cholecalciferol (VITAMIN D -3 PO) Take 1 tablet by mouth daily.   07/16/2023 Morning   gabapentin  (NEURONTIN ) 300 MG capsule Take 1 capsule (300 mg total) by mouth 3 (three) times daily. Start with 1 capsule before bed for 5 days, may then add a second capsule at dinner for 5 days, if needed may add a third capsule in the morning for a total of three capsules a day. (Patient taking differently: Take 300 mg by mouth 2 (two) times daily.) 90 capsule 5 07/16/2023 Bedtime   metFORMIN (GLUCOPHAGE) 500 MG tablet Take 500 mg by mouth daily with breakfast.   07/16/2023 Bedtime  olmesartan (BENICAR) 40 MG tablet Take 40 mg by mouth daily.   07/16/2023 Morning   simvastatin  (ZOCOR ) 40 MG tablet Take 40 mg by mouth every evening.   07/16/2023 Bedtime    Assessment: Pharmacy consulted to dose heparin  in patient with pulmonary embolism.  Patient is not on anticoagulation prior to admission.  2/10 AM update:  Heparin  level therapeutic   Goal of Therapy:  Heparin  level 0.3-0.7 units/ml Monitor platelets by anticoagulation protocol: Yes   Plan:  Cont heparin  900 units/hr Heparin  level with AM labs  Silvestre Drum, PharmD, BCPS Clinical Pharmacist Phone: [phone]

## 2023-07-18 NOTE — Plan of Care (Signed)
  Problem: Acute Rehab PT Goals(only PT should resolve) Goal: Pt Will Go Supine/Side To Sit Outcome: Progressing Flowsheets (Taken 07/18/2023 1534) Pt will go Supine/Side to Sit: with modified independence Goal: Patient Will Transfer Sit To/From Stand Outcome: Progressing Flowsheets (Taken 07/18/2023 1534) Patient will transfer sit to/from stand: with modified independence Goal: Pt Will Transfer Bed To Chair/Chair To Bed Outcome: Progressing Flowsheets (Taken 07/18/2023 1534) Pt will Transfer Bed to Chair/Chair to Bed:  with max assist  with mod assist Goal: Pt Will Ambulate Outcome: Progressing Flowsheets (Taken 07/18/2023 1534) Pt will Ambulate:  10 feet  with moderate assist  with maximum assist  with rolling walker   3:35 PM, 07/18/23 Walton Guppy, MPT Physical Therapist with [facility] [address] [phone] office [phone] mobile phone

## 2023-07-19 ENCOUNTER — Observation Stay (HOSPITAL_COMMUNITY): Payer: Medicare HMO

## 2023-07-19 DIAGNOSIS — I700 Atherosclerosis of aorta: Secondary | ICD-10-CM | POA: Diagnosis present

## 2023-07-19 DIAGNOSIS — G9341 Metabolic encephalopathy: Secondary | ICD-10-CM | POA: Diagnosis present

## 2023-07-19 DIAGNOSIS — E876 Hypokalemia: Secondary | ICD-10-CM | POA: Diagnosis present

## 2023-07-19 DIAGNOSIS — Z8249 Family history of ischemic heart disease and other diseases of the circulatory system: Secondary | ICD-10-CM | POA: Diagnosis not present

## 2023-07-19 DIAGNOSIS — R918 Other nonspecific abnormal finding of lung field: Secondary | ICD-10-CM | POA: Diagnosis not present

## 2023-07-19 DIAGNOSIS — U071 COVID-19: Secondary | ICD-10-CM | POA: Diagnosis present

## 2023-07-19 DIAGNOSIS — Z86711 Personal history of pulmonary embolism: Secondary | ICD-10-CM | POA: Diagnosis not present

## 2023-07-19 DIAGNOSIS — E119 Type 2 diabetes mellitus without complications: Secondary | ICD-10-CM | POA: Diagnosis present

## 2023-07-19 DIAGNOSIS — M109 Gout, unspecified: Secondary | ICD-10-CM | POA: Diagnosis present

## 2023-07-19 DIAGNOSIS — I82412 Acute embolism and thrombosis of left femoral vein: Secondary | ICD-10-CM | POA: Diagnosis present

## 2023-07-19 DIAGNOSIS — G9340 Encephalopathy, unspecified: Secondary | ICD-10-CM | POA: Diagnosis not present

## 2023-07-19 DIAGNOSIS — N179 Acute kidney failure, unspecified: Secondary | ICD-10-CM | POA: Diagnosis not present

## 2023-07-19 DIAGNOSIS — J9600 Acute respiratory failure, unspecified whether with hypoxia or hypercapnia: Secondary | ICD-10-CM | POA: Diagnosis present

## 2023-07-19 DIAGNOSIS — A4189 Other specified sepsis: Secondary | ICD-10-CM | POA: Diagnosis present

## 2023-07-19 DIAGNOSIS — I10 Essential (primary) hypertension: Secondary | ICD-10-CM | POA: Diagnosis present

## 2023-07-19 DIAGNOSIS — E669 Obesity, unspecified: Secondary | ICD-10-CM | POA: Diagnosis present

## 2023-07-19 DIAGNOSIS — E7800 Pure hypercholesterolemia, unspecified: Secondary | ICD-10-CM | POA: Diagnosis present

## 2023-07-19 DIAGNOSIS — Z833 Family history of diabetes mellitus: Secondary | ICD-10-CM | POA: Diagnosis not present

## 2023-07-19 DIAGNOSIS — Z66 Do not resuscitate: Secondary | ICD-10-CM | POA: Diagnosis present

## 2023-07-19 DIAGNOSIS — J9601 Acute respiratory failure with hypoxia: Secondary | ICD-10-CM | POA: Diagnosis not present

## 2023-07-19 DIAGNOSIS — R509 Fever, unspecified: Secondary | ICD-10-CM | POA: Diagnosis present

## 2023-07-19 DIAGNOSIS — I82432 Acute embolism and thrombosis of left popliteal vein: Secondary | ICD-10-CM | POA: Diagnosis present

## 2023-07-19 DIAGNOSIS — I2693 Single subsegmental pulmonary embolism without acute cor pulmonale: Secondary | ICD-10-CM | POA: Diagnosis not present

## 2023-07-19 DIAGNOSIS — I2699 Other pulmonary embolism without acute cor pulmonale: Secondary | ICD-10-CM | POA: Diagnosis present

## 2023-07-19 DIAGNOSIS — J690 Pneumonitis due to inhalation of food and vomit: Secondary | ICD-10-CM | POA: Diagnosis present

## 2023-07-19 DIAGNOSIS — Z7984 Long term (current) use of oral hypoglycemic drugs: Secondary | ICD-10-CM | POA: Diagnosis not present

## 2023-07-19 DIAGNOSIS — G20A1 Parkinson's disease without dyskinesia, without mention of fluctuations: Secondary | ICD-10-CM | POA: Diagnosis present

## 2023-07-19 DIAGNOSIS — Z515 Encounter for palliative care: Secondary | ICD-10-CM | POA: Diagnosis not present

## 2023-07-19 DIAGNOSIS — J984 Other disorders of lung: Secondary | ICD-10-CM | POA: Diagnosis not present

## 2023-07-19 LAB — COMPREHENSIVE METABOLIC PANEL
ALT: 5 U/L (ref 0–44)
AST: 13 U/L — ABNORMAL LOW (ref 15–41)
Albumin: 2.7 g/dL — ABNORMAL LOW (ref 3.5–5.0)
Alkaline Phosphatase: 72 U/L (ref 38–126)
Anion gap: 10 (ref 5–15)
BUN: 35 mg/dL — ABNORMAL HIGH (ref 8–23)
CO2: 26 mmol/L (ref 22–32)
Calcium: 8.6 mg/dL — ABNORMAL LOW (ref 8.9–10.3)
Chloride: 102 mmol/L (ref 98–111)
Creatinine, Ser: 1.05 mg/dL — ABNORMAL HIGH (ref 0.44–1.00)
GFR, Estimated: 53 mL/min — ABNORMAL LOW (ref >60)
Glucose, Bld: 157 mg/dL — ABNORMAL HIGH (ref 70–99)
Potassium: 4.1 mmol/L (ref 3.5–5.1)
Sodium: 138 mmol/L (ref 135–145)
Total Bilirubin: 0.4 mg/dL (ref 0.0–1.2)
Total Protein: 6.5 g/dL (ref 6.5–8.1)

## 2023-07-19 LAB — GLUCOSE, CAPILLARY
Glucose-Capillary: 158 mg/dL — ABNORMAL HIGH (ref 70–99)
Glucose-Capillary: 155 mg/dL — ABNORMAL HIGH (ref 70–99)
Glucose-Capillary: 269 mg/dL — ABNORMAL HIGH (ref 70–99)
Glucose-Capillary: 136 mg/dL — ABNORMAL HIGH (ref 70–99)

## 2023-07-19 LAB — CBC
HCT: 30 % — ABNORMAL LOW (ref 36.0–46.0)
Hemoglobin: 9.7 g/dL — ABNORMAL LOW (ref 12.0–15.0)
MCH: 29.8 pg (ref 26.0–34.0)
MCHC: 32.3 g/dL (ref 30.0–36.0)
MCV: 92.3 fL (ref 80.0–100.0)
Platelets: 192 10*3/uL (ref 150–400)
RBC: 3.25 MIL/uL — ABNORMAL LOW (ref 3.87–5.11)
RDW: 16.5 % — ABNORMAL HIGH (ref 11.5–15.5)
WBC: 14.8 10*3/uL — ABNORMAL HIGH (ref 4.0–10.5)
nRBC: 0 % (ref 0.0–0.2)

## 2023-07-19 LAB — ECHOCARDIOGRAM COMPLETE
AV Mean grad: 5 mm[Hg]
AV Peak grad: 7.8 mm[Hg]
Ao pk vel: 1.4 m/s
Area-P 1/2: 3.15 cm2
Calc EF: 76 %
Est EF: >75
Height: 62 in
S' Lateral: 1.3 cm
Single Plane A2C EF: 74.9 %
Single Plane A4C EF: 76.5 %
Weight: 2239.87 [oz_av]

## 2023-07-19 LAB — MAGNESIUM: Magnesium: 2 mg/dL (ref 1.7–2.4)

## 2023-07-19 LAB — FERRITIN: Ferritin: 211 ng/mL (ref 11–307)

## 2023-07-19 LAB — HEPARIN LEVEL (UNFRACTIONATED)
Heparin Unfractionated: 0.66 [IU]/mL (ref 0.30–0.70)
Heparin Unfractionated: 0.52 [IU]/mL (ref 0.30–0.70)

## 2023-07-19 LAB — C-REACTIVE PROTEIN: CRP: 15.2 mg/dL — ABNORMAL HIGH (ref <1.0)

## 2023-07-19 NOTE — TOC Initial Note (Addendum)
Transition of Care [facility]) - Initial/Assessment Note    Patient Details  Name: [name] MRN: [id number] Date of Birth: Aug 23, 1940  Transition of Care [facility]) CM/SW Contact:    Karn Cassis, LCSW Phone Number: 07/19/2023, 8:20 AM  Clinical Narrative: Pt admitted due to pulmonary embolism. COVID +. Pt defers assessment to daughter. Pt's daughter reports pt lives with her husband and she lives close by and cares for pt during the day. Pt has been weak for 2 months and has been bedbound for over a month. She is active with [facility] HHPT/OT/RN. [place] with [facility] notified of admission. She has hospital bed. PT evaluated pt and recommend SNF. LCSW discussed placement process and reviewed Medicare.gov ratings. Daughter agreeable to send referral to [facility], [facility], and [facility]. LCSW will initiate bed search and start auth when appropriate.                   Expected Discharge Plan: Skilled Nursing Facility Barriers to Discharge: Continued Medical Work up   Patient Goals and CMS Choice Patient states their goals for this hospitalization and ongoing recovery are:: SNF   Choice offered to / list presented to : Adult Children [place] ownership interest in [facility].provided to:: Adult Children    Expected Discharge Plan and Services In-house Referral: Clinical Social Work   Post Acute Care Choice: Skilled Nursing Facility Living arrangements for the past 2 months: Single Family Home                                      Prior Living Arrangements/Services Living arrangements for the past 2 months: Single Family Home Lives with:: Spouse Patient language and need for interpreter reviewed:: Yes Do you feel safe going back to the place where you live?: Yes      Need for Family Participation in Patient Care: Yes (Comment) Care giver support system in place?: Yes (comment) Current home services: DME, Home PT, Home OT (hospital bed,  transport chair) Criminal Activity/Legal Involvement Pertinent to Current Situation/Hospitalization: No - Comment as needed  Activities of Daily Living   ADL Screening (condition at time of admission) Independently performs ADLs?: No Does the patient have a NEW difficulty with bathing/dressing/toileting/self-feeding that is expected to last >3 days?: Yes (Initiates electronic notice to provider for possible OT consult) Does the patient have a NEW difficulty with getting in/out of bed, walking, or climbing stairs that is expected to last >3 days?: Yes (Initiates electronic notice to provider for possible PT consult) Does the patient have a NEW difficulty with communication that is expected to last >3 days?: No Is the patient deaf or have difficulty hearing?: No Does the patient have difficulty seeing, even when wearing glasses/contacts?: No Does the patient have difficulty concentrating, remembering, or making decisions?: No  Permission Sought/Granted                  Emotional Assessment         Alcohol / Substance Use: Not Applicable Psych Involvement: No (comment)  Admission diagnosis:  Pulmonary embolism (HCC) [I26.99] Acute encephalopathy [G93.40] Other acute pulmonary embolism without acute cor pulmonale (HCC) [I26.99] COVID-19 virus infection [U07.1] Patient Active Problem List   Diagnosis Date Noted   Pulmonary embolism (HCC) 07/17/2023   COVID-19 virus infection 07/17/2023   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   Acute encephalopathy 04/22/2018   Bradypnea  Primary localized osteoarthritis of right knee 02/10/2018   Acute viral syndrome 10/06/2016   Weakness 10/06/2016   Hypercholesteremia 10/06/2016   Gout 10/06/2016   Diabetes mellitus without complication (HCC) 10/06/2016   Viral syndrome 10/06/2016   Precordial chest pain 09/11/2016   Diabetes (HCC) 05/03/2016   HYPOKALEMIA 11/01/2008   HYPERGLYCEMIA, FASTING 11/01/2008   Type 2 diabetes mellitus with  hyperlipidemia (HCC) 10/17/2008   Gout, unspecified 10/17/2008   OBESITY 10/17/2008   Migraine headache 10/17/2008   Essential hypertension 10/17/2008   Internal hemorrhoids 10/17/2008   Diverticulosis of colon 10/17/2008   Arthropathy 10/17/2008   PCP:  Benita Stabile, MD Pharmacy:   [facility] - [place], [place] - [address] [place] [address] Phone: [phone] Fax: [phone]  [facility] - [place], [place] - [address] [place] [address] Phone: [phone] Fax: [phone]     Social Drivers of Health (SDOH) Social History: SDOH Screenings   Food Insecurity: No Food Insecurity (07/17/2023)  Housing: Low Risk  (07/17/2023)  Transportation Needs: No Transportation Needs (07/17/2023)  Utilities: Not At Risk (07/17/2023)  Social Connections: Unknown (07/17/2023)  Tobacco Use: Low Risk  (07/17/2023)   SDOH Interventions:     Readmission Risk Interventions     No data to display

## 2023-07-19 NOTE — NC FL2 (Signed)
MEDICAID FL2 LEVEL OF CARE FORM     IDENTIFICATION  Patient Name: [name] Birthdate: [birth year] Sex: female Admission Date (Current Location): 07/17/2023  [facility] and [place] Number:  [employer] and Address:  [facility],  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   Acute encephalopathy 04/22/2018   Bradypnea    Primary localized osteoarthritis of right knee 02/10/2018   Acute viral syndrome 10/06/2016   Weakness 10/06/2016   Hypercholesteremia 10/06/2016   Gout 10/06/2016   Diabetes mellitus without complication (HCC) 10/06/2016   Viral syndrome 10/06/2016   Precordial chest pain 09/11/2016   Diabetes (HCC) 05/03/2016   HYPOKALEMIA 11/01/2008   HYPERGLYCEMIA, FASTING 11/01/2008   Type 2 diabetes mellitus with hyperlipidemia (HCC) 10/17/2008   Gout, unspecified 10/17/2008   OBESITY 10/17/2008   Migraine headache 10/17/2008   Essential hypertension 10/17/2008   Internal hemorrhoids 10/17/2008   Diverticulosis of colon 10/17/2008   Arthropathy 10/17/2008    Orientation RESPIRATION BLADDER Height & Weight     Self, Time, Situation, Place  Normal Incontinent Weight: 139 lb 15.9 oz (63.5 kg) Height:  5\' 2"  (157.5 cm)  BEHAVIORAL SYMPTOMS/MOOD NEUROLOGICAL BOWEL NUTRITION STATUS      Incontinent Diet (See d/c summary)  AMBULATORY STATUS COMMUNICATION OF NEEDS Skin   Total Care Verbally Normal                       Personal Care Assistance  Level of Assistance  Bathing, Feeding, Dressing Bathing Assistance: Maximum assistance Feeding assistance: Limited assistance Dressing Assistance: Maximum assistance     Functional Limitations Info  Sight, Hearing, Speech Sight Info: Adequate Hearing Info: Adequate Speech Info: Adequate    SPECIAL CARE FACTORS FREQUENCY  PT (By licensed PT)     PT Frequency: 5x weekly              Contractures      Additional Factors Info  Code Status, Allergies, Isolation Precautions Code Status Info: DNR- pre-arrest interventions desired Allergies Info: No known allergies     Isolation Precautions Info: Airborne/contact (COVID + 07/17/23)     Current Medications (07/19/2023):  This is the current hospital active medication list Current Facility-Administered Medications  Medication Dose Route Frequency Provider Last Rate Last Admin   acetaminophen (TYLENOL) tablet 650 mg  650 mg Oral Q6H PRN Emokpae, Ejiroghene E, MD   650 mg at 07/18/23 1029   Or   acetaminophen (TYLENOL) suppository 650 mg  650 mg Rectal Q6H PRN Emokpae, Ejiroghene E, MD       albuterol (PROVENTIL) (2.5 MG/3ML) 0.083% nebulizer solution 2.5 mg  2.5 mg Inhalation Q6H PRN Emokpae, Ejiroghene E, MD       allopurinol (ZYLOPRIM) tablet 100 mg  100 mg Oral Daily Emokpae, Ejiroghene E, MD   100 mg at 07/18/23 1018   amLODipine (NORVASC) tablet 5 mg  5 mg Oral Daily Emokpae, Ejiroghene E, MD   5 mg at  07/18/23 2133   carbidopa-levodopa (SINEMET IR) 25-100 MG per tablet immediate release 1 tablet  1 tablet Oral QID Emokpae, Ejiroghene E, MD   1 tablet at 07/19/23 2956   gabapentin (NEURONTIN) capsule 300 mg  300 mg Oral BID Emokpae, Ejiroghene E, MD   300 mg at 07/18/23 2133   guaiFENesin-dextromethorphan (ROBITUSSIN DM) 100-10 MG/5ML syrup 5 mL  5 mL Oral Q4H PRN Adefeso, Oladapo, DO       heparin ADULT infusion 100 units/mL (25000 units/251mL)  650 Units/hr Intravenous Continuous Meegan, Eryn, RPH 6.5 mL/hr at 07/18/23 2131 650  Units/hr at 07/18/23 2131   insulin aspart (novoLOG) injection 0-5 Units  0-5 Units Subcutaneous QHS Emokpae, Ejiroghene E, MD       insulin aspart (novoLOG) injection 0-9 Units  0-9 Units Subcutaneous TID WC Emokpae, Ejiroghene E, MD   2 Units at 07/18/23 1713   methylPREDNISolone sodium succinate (SOLU-MEDROL) 125 mg/2 mL injection 60 mg  60 mg Intravenous Pablo Ledger, MD   60 mg at 07/19/23 0114   nirmatrelvir/ritonavir (renal dosing) (PAXLOVID) 2 tablet  2 tablet Oral BID Emokpae, Ejiroghene E, MD   2 tablet at 07/18/23 2134   ondansetron (ZOFRAN) tablet 4 mg  4 mg Oral Q6H PRN Emokpae, Ejiroghene E, MD       Or   ondansetron (ZOFRAN) injection 4 mg  4 mg Intravenous Q6H PRN Emokpae, Ejiroghene E, MD       phenol (CHLORASEPTIC) mouth spray 1 spray  1 spray Mouth/Throat PRN Tat, David, MD   1 spray at 07/18/23 1502   polyethylene glycol (MIRALAX / GLYCOLAX) packet 17 g  17 g Oral Daily PRN Emokpae, Ejiroghene E, MD         Discharge Medications: Please see discharge summary for a list of discharge medications.  Relevant Imaging Results:  Relevant Lab Results:   Additional Information SSN: [id number]  Karn Cassis, LCSW

## 2023-07-19 NOTE — Progress Notes (Signed)
PHARMACY - ANTICOAGULATION CONSULT NOTE  Pharmacy Consult for heparin Indication: pulmonary embolus  No Known Allergies  Patient Measurements: Height: 5\' 2"  (157.5 cm) Weight: 63.5 kg (139 lb 15.9 oz) IBW/kg (Calculated) : 50.1 Heparin Dosing Weight: 63 kg  Vital Signs: Temp: 97.6 F (36.4 C) (02/11 0438) Temp Source: Oral (02/11 0438) BP: 143/89 (02/11 0438) Pulse Rate: 91 (02/11 0438)  Labs: Recent Labs    07/17/23 1246 07/17/23 2318 07/18/23 0425 07/18/23 1810 07/19/23 0429  HGB 10.0*  --  9.9*  --  9.7*  HCT 30.7*  --  32.0*  --  30.0*  PLT 193  --  190  --  192  APTT 29  --   --   --   --   LABPROT 14.3  --   --   --   --   INR 1.1  --   --   --   --   HEPARINUNFRC  --    < > 0.85* 0.77* 0.66  CREATININE 0.79  --  0.75  --  1.05*   < > = values in this interval not displayed.   Estimated Creatinine Clearance: 36.2 mL/min (A) (by C-G formula based on SCr of 1.05 mg/dL (H)).  Medical History: Past Medical History:  Diagnosis Date   Aortic atherosclerosis (HCC) 09/11/2016   noted on CXR   Arthritis    Baker's cyst, left 2014   Small, Left   Bilateral leg numbness    Cataract    Bilateral   Diabetes mellitus without complication (HCC)    Diverticulosis 10/09/2016   Noted on CT abd/pelvis   Gout    Grade I diastolic dysfunction 09/12/2016   Noted on ECHO   Hepatic cyst 10/09/2016   Noted on CT abd/pelvis   History of iron deficiency anemia    History of migraine    Hypercholesteremia    Hypertension    Internal hemorrhoids    Lateral meniscal tear 2014   Left   LVH (left ventricular hypertrophy) 03/03/2018   noted on EKG   Obesity    PMB (postmenopausal bleeding)    Pneumonia 2018   Right upper lower   PONV (postoperative nausea and vomiting)    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   07/17/2023 at  9:00 AM   allopurinol (ZYLOPRIM) 100 MG tablet Take 100 mg by mouth daily.   07/16/2023 Morning   amLODipine (NORVASC) 5 MG tablet Take 5 mg by mouth daily.    07/16/2023 Bedtime   carbidopa-levodopa (SINEMET IR) 25-100 MG tablet Take 1 tablet by mouth 4 (four) times daily. At 6 AM, 10 AM, 2 PM and 6 PM 360 tablet 3 07/16/2023 Evening   chlorthalidone (HYGROTON) 25 MG tablet Take 12.5 mg by mouth daily.   07/16/2023 Morning   Cholecalciferol (VITAMIN D-3 PO) Take 1 tablet by mouth daily.   07/16/2023 Morning   gabapentin (NEURONTIN) 300 MG capsule Take 1 capsule (300 mg total) by mouth 3 (three) times daily. Start with 1 capsule before  bed for 5 days, may then add a second capsule at dinner for 5 days, if needed may add a third capsule in the morning for a total of three capsules a day. (Patient taking differently: Take 300 mg by mouth 2 (two) times daily.) 90 capsule 5 07/16/2023 Bedtime   metFORMIN (GLUCOPHAGE) 500 MG tablet Take 500 mg by mouth daily with breakfast.   07/16/2023 Bedtime   olmesartan (BENICAR) 40 MG tablet Take 40 mg by mouth daily.   07/16/2023 Morning   simvastatin (ZOCOR) 40 MG tablet Take 40 mg by mouth every evening.   07/16/2023 Bedtime   Assessment: Pharmacy consulted to dose heparin in patient with pulmonary embolism.  Patient is not on anticoagulation prior to admission.  HL 0.66, therapeutic. No issues with infusion. Currently on Paxlovid, completes tx 2/14, then can transition to po tx.   Goal of Therapy:  Heparin level 0.3-0.7 units/ml Monitor platelets by anticoagulation protocol: Yes   Plan:  Continue heparin infusion at 650 units/hr Check next anti-Xa level in 8 hours and daily while on  heparin Continue to monitor H&H and platelets  Elder Cyphers, BS Pharm D, BCPS Clinical Pharmacist 07/19/2023 10:15 AM

## 2023-07-19 NOTE — Progress Notes (Signed)
PHARMACY - ANTICOAGULATION CONSULT NOTE  Pharmacy Consult for heparin Indication: pulmonary embolus  No Known Allergies  Patient Measurements: Height: 5\' 2"  (157.5 cm) Weight: 63.5 kg (139 lb 15.9 oz) IBW/kg (Calculated) : 50.1 Heparin Dosing Weight: 63 kg  Vital Signs: Temp: 97.9 F (36.6 C) (02/11 1413) Temp Source: Oral (02/11 1413) BP: 114/62 (02/11 1413) Pulse Rate: 95 (02/11 1413)  Labs: Recent Labs    07/17/23 1246 07/17/23 2318 07/18/23 0425 07/18/23 1810 07/19/23 0429 07/19/23 1414  HGB 10.0*  --  9.9*  --  9.7*  --   HCT 30.7*  --  32.0*  --  30.0*  --   PLT 193  --  190  --  192  --   APTT 29  --   --   --   --   --   LABPROT 14.3  --   --   --   --   --   INR 1.1  --   --   --   --   --   HEPARINUNFRC  --    < > 0.85* 0.77* 0.66 0.52  CREATININE 0.79  --  0.75  --  1.05*  --    < > = values in this interval not displayed.   Estimated Creatinine Clearance: 36.2 mL/min (A) (by C-G formula based on SCr of 1.05 mg/dL (H)).  Medical History: Past Medical History:  Diagnosis Date   Aortic atherosclerosis (HCC) 09/11/2016   noted on CXR   Arthritis    Baker's cyst, left 2014   Small, Left   Bilateral leg numbness    Cataract    Bilateral   Diabetes mellitus without complication (HCC)    Diverticulosis 10/09/2016   Noted on CT abd/pelvis   Gout    Grade I diastolic dysfunction 09/12/2016   Noted on ECHO   Hepatic cyst 10/09/2016   Noted on CT abd/pelvis   History of iron deficiency anemia    History of migraine    Hypercholesteremia    Hypertension    Internal hemorrhoids    Lateral meniscal tear 2014   Left   LVH (left ventricular hypertrophy) 03/03/2018   noted on EKG   Obesity    PMB (postmenopausal bleeding)    Pneumonia 2018   Right upper lower   PONV (postoperative nausea and vomiting)    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   07/17/2023 at  9:00 AM   allopurinol (ZYLOPRIM) 100 MG tablet Take 100 mg by mouth daily.   07/16/2023 Morning   amLODipine (NORVASC) 5 MG tablet Take 5 mg by mouth daily.    07/16/2023 Bedtime   carbidopa-levodopa (SINEMET IR) 25-100 MG tablet Take 1 tablet by mouth 4 (four) times daily. At 6 AM, 10 AM, 2 PM and 6 PM 360 tablet 3 07/16/2023 Evening   chlorthalidone (HYGROTON) 25 MG tablet Take 12.5 mg by mouth daily.   07/16/2023 Morning   Cholecalciferol (VITAMIN D-3 PO) Take 1 tablet by mouth daily.   07/16/2023 Morning  gabapentin (NEURONTIN) 300 MG capsule Take 1 capsule (300 mg total) by mouth 3 (three) times daily. Start with 1 capsule before bed for 5 days, may then add a second capsule at dinner for 5 days, if needed may add a third capsule in the morning for a total of three capsules a day. (Patient taking differently: Take 300 mg by mouth 2 (two) times daily.) 90 capsule 5 07/16/2023 Bedtime   metFORMIN (GLUCOPHAGE) 500 MG tablet Take 500 mg by mouth daily with breakfast.   07/16/2023 Bedtime   olmesartan (BENICAR) 40 MG tablet Take 40 mg by mouth daily.   07/16/2023 Morning   simvastatin (ZOCOR) 40 MG tablet Take 40 mg by mouth every evening.   07/16/2023 Bedtime   Assessment: Pharmacy consulted to dose heparin in patient with pulmonary embolism.  Patient is not on anticoagulation prior to admission.  HL 0.66> 0.52, remains therapeutic. No issues with infusion. Currently on Paxlovid, completes tx 2/14, then can transition to po tx.   Goal of Therapy:  Heparin level 0.3-0.7 units/ml Monitor platelets by anticoagulation protocol: Yes   Plan:  Continue heparin  infusion at 650 units/hr Check anti-Xa level daily while on heparin Continue to monitor H&H and platelets  Elder Cyphers, BS Pharm D, BCPS Clinical Pharmacist 07/19/2023 3:15 PM

## 2023-07-19 NOTE — Progress Notes (Addendum)
PROGRESS NOTE  [name] ZOX:[id number] DOB: [birth year] DOA: 07/17/2023 PCP: Benita Stabile, MD  Brief History:   83 y.o. female with medical history significant for Parkinson's, diabetes mellitus, hypertension, gout. Patient was brought to the ED reports of generalized weakness, fever, body aches, and cough.  Symptoms started yesterday.   Patient denies chest pain, no urinary symptoms, no vomiting no diarrhea no abdominal pain.  No history of blood clots.  She has been bedbound over the past month, prior to this she was barely mobile sometimes using her wheelchair.   ED Course: Tmax 101.3.  Heart rate 84-92.  Respiratory rate 17-23.  Blood pressure systolic 1 43-164.  O2 sats 91 to 96% on room air. Lactic acid 0.9.  Potassium 3.2.  WBC 10. Head CT without acute abnormality, shows sequelae of chronic microvascular ischemia. CT chest abdomen pelvis with contrast incidental acute segmental PE in the lingula, also findings suggestive of bronchitis, and chronic diverticulosis. Heparin drip started.  1 L bolus given.   Assessment/Plan: Acute pulmonary embolism/DVT lower extremities -Continue IV heparin>>transition to apixaban when done with Paxlovid -[place] legs positive for DVTs bilateral -Patient has limited mobility and COVID-19 -Echo EF >75%, G1DD, normal RVSP   COVID-19 infection -Continue Paxlovid -Started IV Solu-Medrol -Patient saturation down to 90% on room air -Follow- inflammatory markers   Hyperlipidemia -Holding simvastatin while on Paxlovid   Diabetes mellitus type 2, controlled -07/17/2023 hemoglobin A1c 5.2 -Hold metformin -CBGs controlled   Hypertension -Restart amlodipine -Olmesartan hydrochlorothiazide   Hypokalemia -Repleting -Check magnesium 2.0   Parkinson's disease -Continue Sinemet -Outpatient neurology follow-up               Family Communication:   daughter at bedside 2/11   Consultants:  none   Code Status:  DNR   DVT  Prophylaxis:  IV Heparin     Procedures: As Listed in Progress Note Above   Antibiotics: None             Subjective: Patient denies fevers, chills, headache, chest pain, dyspnea, nausea, vomiting, diarrhea, abdominal pain, dysuria, hematuria, hematochezia, and melena.   Objective: Vitals:   07/18/23 1615 07/18/23 2124 07/19/23 0438 07/19/23 1413  BP: 135/82 (!) 147/78 (!) 143/89 114/62  Pulse: 96 88 91 95  Resp: 20 17 16 20   Temp: 99.8 F (37.7 C) (!) 97.5 F (36.4 C) 97.6 F (36.4 C) 97.9 F (36.6 C)  TempSrc: Oral Oral Oral Oral  SpO2: 93% 94% 93% 93%  Weight:      Height:        Intake/Output Summary (Last 24 hours) at 07/19/2023 1615 Last data filed at 07/18/2023 1910 Gross per 24 hour  Intake --  Output 200 ml  Net -200 ml   Weight change:  Exam:  General:  Pt is alert, follows commands appropriately, not in acute distress HEENT: No icterus, No thrush, No neck mass, [place]/AT Cardiovascular: RRR, S1/S2, no rubs, no gallops Respiratory: bilateral rales.  No wheeze Abdomen: Soft/+BS, non tender, non distended, no guarding Extremities: 1  + LE edema, No lymphangitis, No petechiae, No rashes, no synovitis   Data Reviewed: I have personally reviewed following labs and imaging studies Basic Metabolic Panel: Recent Labs  Lab 07/17/23 1246 07/17/23 1621 07/18/23 0425 07/19/23 0429  NA 140  --  139 138  K 3.2*  --  3.7 4.1  CL 101  --  103 102  CO2 27  --  26 26  GLUCOSE 108*  --  98 157*  BUN 23  --  22 35*  CREATININE 0.79  --  0.75 1.05*  CALCIUM 8.8*  --  8.6* 8.6*  MG  --  1.7  --  2.0   Liver Function Tests: Recent Labs  Lab 07/17/23 1246 07/19/23 0429  AST 12* 13*  ALT 9 5  ALKPHOS 74 72  BILITOT 0.7 0.4  PROT 6.1* 6.5  ALBUMIN 2.7* 2.7*   No results for input(s): "LIPASE", "AMYLASE" in the last 168 hours. No results for input(s): "AMMONIA" in the last 168 hours. Coagulation Profile: Recent Labs  Lab 07/17/23 1246  INR 1.1    CBC: Recent Labs  Lab 07/17/23 1246 07/18/23 0425 07/19/23 0429  WBC 10.0 11.3* 14.8*  NEUTROABS 8.4*  --   --   HGB 10.0* 9.9* 9.7*  HCT 30.7* 32.0* 30.0*  MCV 91.9 92.8 92.3  PLT 193 190 192   Cardiac Enzymes: No results for input(s): "CKTOTAL", "CKMB", "CKMBINDEX", "TROPONINI" in the last 168 hours. BNP: Invalid input(s): "POCBNP" CBG: Recent Labs  Lab 07/18/23 1215 07/18/23 1617 07/18/23 2124 07/19/23 0731 07/19/23 1129  GLUCAP 176* 165* 174* 158* 155*   HbA1C: Recent Labs    07/17/23 1621  HGBA1C 5.2   Urine analysis:    Component Value Date/Time   COLORURINE YELLOW 07/17/2023 1335   APPEARANCEUR CLEAR 07/17/2023 1335   LABSPEC 1.027 07/17/2023 1335   PHURINE 7.0 07/17/2023 1335   GLUCOSEU NEGATIVE 07/17/2023 1335   HGBUR MODERATE (A) 07/17/2023 1335   HGBUR negative 10/17/2008 1020   BILIRUBINUR NEGATIVE 07/17/2023 1335   KETONESUR NEGATIVE 07/17/2023 1335   PROTEINUR 100 (A) 07/17/2023 1335   UROBILINOGEN 1.0 10/17/2008 1020   NITRITE NEGATIVE 07/17/2023 1335   LEUKOCYTESUR NEGATIVE 07/17/2023 1335   Sepsis Labs: @LABRCNTIP (procalcitonin:4,lacticidven:4) ) Recent Results (from the past 240 hours)  Resp panel by RT-PCR (RSV, Flu A&B, Covid) Anterior Nasal Swab     Status: Abnormal   Collection Time: 07/17/23 12:15 PM   Specimen: Anterior Nasal Swab  Result Value Ref Range Status   SARS Coronavirus 2 by RT PCR POSITIVE (A) NEGATIVE Final    Comment: (NOTE) SARS-CoV-2 target nucleic acids are DETECTED.  The SARS-CoV-2 RNA is generally detectable in upper respiratory specimens during the acute phase of infection. Positive results are indicative of the presence of the identified virus, but do not rule out bacterial infection or co-infection with other pathogens not detected by the test. Clinical correlation with patient history and other diagnostic information is necessary to determine patient infection status. The expected result is  Negative.  Fact Sheet for Patients: BloggerCourse.com  Fact Sheet for Healthcare Providers: SeriousBroker.it  This test is not yet approved or cleared by the [place] FDA and  has been authorized for detection and/or diagnosis of SARS-CoV-2 by FDA under an Emergency Use Authorization (EUA).  This EUA will remain in effect (meaning this test can be used) for the duration of  the COVID-19 declaration under Section 564(b)(1) of the A ct, 21 U.S.C. section 360bbb-3(b)(1), unless the authorization is terminated or revoked sooner.     Influenza A by PCR NEGATIVE NEGATIVE Final   Influenza B by PCR NEGATIVE NEGATIVE Final    Comment: (NOTE) The Xpert Xpress SARS-CoV-2/FLU/RSV plus assay is intended as an aid in the diagnosis of influenza from Nasopharyngeal swab specimens and should not be used as a sole basis for treatment. Nasal washings and aspirates are unacceptable for Xpert Xpress  SARS-CoV-2/FLU/RSV testing.  Fact Sheet for Patients: BloggerCourse.com  Fact Sheet for Healthcare Providers: SeriousBroker.it  This test is not yet approved or cleared by the [place] FDA and has been authorized for detection and/or diagnosis of SARS-CoV-2 by FDA under an Emergency Use Authorization (EUA). This EUA will remain in effect (meaning this test can be used) for the duration of the COVID-19 declaration under Section 564(b)(1) of the Act, 21 U.S.C. section 360bbb-3(b)(1), unless the authorization is terminated or revoked.     Resp Syncytial Virus by PCR NEGATIVE NEGATIVE Final    Comment: (NOTE) Fact Sheet for Patients: BloggerCourse.com  Fact Sheet for Healthcare Providers: SeriousBroker.it  This test is not yet approved or cleared by the [place] FDA and has been authorized for detection and/or diagnosis of SARS-CoV-2  by FDA under an Emergency Use Authorization (EUA). This EUA will remain in effect (meaning this test can be used) for the duration of the COVID-19 declaration under Section 564(b)(1) of the Act, 21 U.S.C. section 360bbb-3(b)(1), unless the authorization is terminated or revoked.  Performed at [facility], [address] [place], [place] [address]   Blood Culture (routine x 2)     Status: None (Preliminary result)   Collection Time: 07/17/23 12:46 PM   Specimen: BLOOD RIGHT ARM  Result Value Ref Range Status   Specimen Description   Final    BLOOD RIGHT ARM BOTTLES DRAWN AEROBIC AND ANAEROBIC   Special Requests Blood Culture adequate volume  Final   Culture   Final    NO GROWTH 2 DAYS Performed at [facility], [address], [place], [place] [address]    Report Status PENDING  Incomplete  Blood Culture (routine x 2)     Status: None (Preliminary result)   Collection Time: 07/17/23 12:46 PM   Specimen: BLOOD LEFT HAND  Result Value Ref Range Status   Specimen Description   Final    BLOOD LEFT HAND BOTTLES DRAWN AEROBIC AND ANAEROBIC   Special Requests Blood Culture adequate volume  Final   Culture   Final    NO GROWTH 2 DAYS Performed at [facility], [address], [place], [place] [address]    Report Status PENDING  Incomplete     Scheduled Meds:  allopurinol  100 mg Oral Daily   amLODipine  5 mg Oral Daily   carbidopa-levodopa  1 tablet Oral QID   gabapentin  300 mg Oral BID   insulin aspart  0-5 Units Subcutaneous QHS   insulin aspart  0-9 Units Subcutaneous TID WC   methylPREDNISolone (SOLU-MEDROL) injection  60 mg Intravenous Q12H   nirmatrelvir/ritonavir (renal dosing)  2 tablet Oral BID   Continuous Infusions:  heparin 650 Units/hr (07/18/23 2131)    Procedures/Studies: ECHOCARDIOGRAM COMPLETE Result Date: 07/19/2023    ECHOCARDIOGRAM REPORT   Patient Name:   KINDRA BICKHAM Date of Exam: 07/19/2023 Medical Rec #:  [id number]       Height:       62.0 in  Accession #:    [id number]      Weight:       140.0 lb Date of Birth:  Nov 12, 1940       BSA:          1.643 m Patient Age:    82 years        BP:           143/89 mmHg Patient Gender: F  HR:           92 bpm. [facility] Procedure: 2D Echo, Cardiac Doppler and Color Doppler Indications:    Pulmonary Embolism  History:        Patient has prior history of Echocardiogram examinations, most                 recent 09/12/2016. Signs/Symptoms:Covid +; Risk                 Factors:Hypertension and Diabetes.  Sonographer:    Amy Chionchio Referring Phys: [id number] EMOKPAE IMPRESSIONS  1. Moderate intracavitary gradient of 39 mm Hg noted likely from vigorous contraction of left ventricle. Left ventricular ejection fraction, by estimation, is >75%. The left ventricle has hyperdynamic function. The left ventricle has no regional wall motion abnormalities. Left ventricular diastolic parameters are consistent with Grade I diastolic dysfunction (impaired relaxation).  2. Right ventricular systolic function was not well visualized. The right ventricular size is normal. There is normal pulmonary artery systolic pressure.  3. The mitral valve is grossly normal. No evidence of mitral valve regurgitation. No evidence of mitral stenosis.  4. The aortic valve is tricuspid. Aortic valve regurgitation is mild. No aortic stenosis is present.  5. The inferior vena cava is normal in size with greater than 50% respiratory variability, suggesting right atrial pressure of 3 mmHg. Comparison(s): No prior Echocardiogram. FINDINGS  Left Ventricle: Left ventricular ejection fraction, by estimation, is >75%. The left ventricle has hyperdynamic function. The left ventricle has no regional wall motion abnormalities. The left ventricular internal cavity size was small. There is no left  ventricular hypertrophy. Left ventricular diastolic parameters are consistent with Grade I diastolic dysfunction (impaired relaxation).  Normal left ventricular filling pressure. Right Ventricle: The right ventricular size is normal. No increase in right ventricular wall thickness. Right ventricular systolic function was not well visualized. There is normal pulmonary artery systolic pressure. The tricuspid regurgitant velocity is  2.17 m/s, and with an assumed right atrial pressure of 3 mmHg, the estimated right ventricular systolic pressure is 21.8 mmHg. Left Atrium: Left atrial size was normal in size. Right Atrium: Right atrial size was normal in size. Pericardium: There is no evidence of pericardial effusion. Mitral Valve: The mitral valve is grossly normal. Mild to moderate mitral annular calcification. No evidence of mitral valve regurgitation. No evidence of mitral valve stenosis. MV peak gradient, 6.4 mmHg. The mean mitral valve gradient is 3.0 mmHg. Tricuspid Valve: The tricuspid valve is normal in structure. Tricuspid valve regurgitation is trivial. No evidence of tricuspid stenosis. Aortic Valve: The aortic valve is tricuspid. Aortic valve regurgitation is mild. No aortic stenosis is present. Aortic valve mean gradient measures 5.0 mmHg. Aortic valve peak gradient measures 7.8 mmHg. Pulmonic Valve: The pulmonic valve was normal in structure. Pulmonic valve regurgitation is not visualized. No evidence of pulmonic stenosis. Aorta: The aortic root and ascending aorta are structurally normal, with no evidence of dilitation. Venous: The inferior vena cava is normal in size with greater than 50% respiratory variability, suggesting right atrial pressure of 3 mmHg. IAS/Shunts: No atrial level shunt detected by color flow Doppler.  LEFT VENTRICLE PLAX 2D LVIDd:         2.00 cm     Diastology LVIDs:         1.30 cm     LV e' medial:    4.57 cm/s LV PW:         0.80 cm  LV E/e' medial:  12.9 LV IVS:        1.50 cm     LV e' lateral:   6.42 cm/s LVOT diam:     2.00 cm     LV E/e' lateral: 9.2 LVOT Area:     3.14 cm  LV Volumes (MOD) LV vol d, MOD  A2C: 45.0 ml LV vol d, MOD A4C: 57.5 ml LV vol s, MOD A2C: 11.3 ml LV vol s, MOD A4C: 13.5 ml LV SV MOD A2C:     33.7 ml LV SV MOD A4C:     57.5 ml LV SV MOD BP:      39.3 ml RIGHT VENTRICLE          IVC RV Basal diam:  3.40 cm  IVC diam: 1.60 cm TAPSE (M-mode): 1.8 cm LEFT ATRIUM             Index        RIGHT ATRIUM           Index LA Vol (A2C):   30.8 ml 18.75 ml/m  RA Area:     12.80 cm LA Vol (A4C):   37.2 ml 22.64 ml/m  RA Volume:   28.70 ml  17.47 ml/m LA Biplane Vol: 37.0 ml 22.52 ml/m  AORTIC VALVE               PULMONIC VALVE AV Vmax:      140.00 cm/s  PV Vmax:       0.79 m/s AV Vmean:     105.000 cm/s PV Peak grad:  2.5 mmHg AV VTI:       0.208 m AV Peak Grad: 7.8 mmHg AV Mean Grad: 5.0 mmHg  AORTA Ao Root diam: 3.10 cm Ao Asc diam:  3.50 cm MITRAL VALVE                TRICUSPID VALVE MV Area (PHT): 3.15 cm     TR Peak grad:   18.8 mmHg MV Peak grad:  6.4 mmHg     TR Vmax:        217.00 cm/s MV Mean grad:  3.0 mmHg MV Vmax:       1.26 m/s     SHUNTS MV Vmean:      76.4 cm/s    Systemic Diam: 2.00 cm MV Decel Time: 241 msec MV E velocity: 59.10 cm/s MV A velocity: 103.00 cm/s MV E/A ratio:  0.57 Vishnu Priya Mallipeddi Electronically signed by Winfield Rast Mallipeddi Signature Date/Time: 07/19/2023/1:46:10 PM    Final    US Venous Img Lower Bilateral (DVT) Result Date: 07/18/2023 CLINICAL DATA:  Pulmonary embolism. Evaluate for lower extremity DVT. EXAM: BILATERAL LOWER EXTREMITY VENOUS DOPPLER ULTRASOUND TECHNIQUE: Gray-scale sonography with graded compression, as well as color Doppler and duplex ultrasound were performed to evaluate the lower extremity deep venous systems from the level of the common femoral vein and including the common femoral, femoral, profunda femoral, popliteal and calf veins including the posterior tibial, peroneal and gastrocnemius veins when visible. The superficial great saphenous vein was also interrogated. Spectral Doppler was utilized to evaluate flow at rest and with  distal augmentation maneuvers in the common femoral, femoral and popliteal veins. COMPARISON:  None Available. FINDINGS: RIGHT LOWER EXTREMITY Common Femoral Vein: No evidence of thrombus. Normal compressibility, respiratory phasicity and response to augmentation. Saphenofemoral Junction: Small amount of echogenic thrombus at the saphenofemoral junction. Profunda Femoral Vein: No evidence of thrombus. Normal compressibility and flow on color Doppler imaging.  Femoral Vein: No evidence of thrombus. Normal compressibility, respiratory phasicity and response to augmentation. Popliteal Vein: No evidence of thrombus. Normal compressibility, respiratory phasicity and response to augmentation. Calf Veins: Visualized right deep calf veins are patent without thrombus. Other Findings:  None. LEFT LOWER EXTREMITY Common Femoral Vein: Positive for thrombus. Echogenic thrombus in left common femoral vein. Saphenofemoral Junction: No evidence of thrombus. Normal compressibility and flow on color Doppler imaging. Profunda Femoral Vein: Positive for thrombus. Femoral Vein: Positive for thrombus. Popliteal Vein: Positive for thrombus Calf Veins: Limited evaluation. Other Findings:  None. IMPRESSION: 1. Positive for DVT in the left lower extremity. Thrombus involving the left common femoral vein, profunda femoral vein, femoral vein and popliteal vein. 2. Small amount of thrombus at the right saphenofemoral junction. Electronically Signed   By: Richarda Overlie M.D.   On: 07/18/2023 11:41   CT CHEST ABDOMEN PELVIS W CONTRAST Result Date: 07/17/2023 CLINICAL DATA:  Sepsis.  Mental status change.  Fever. EXAM: CT CHEST, ABDOMEN, AND PELVIS WITH CONTRAST TECHNIQUE: Multidetector CT imaging of the chest, abdomen and pelvis was performed following the standard protocol during bolus administration of intravenous contrast. RADIATION DOSE REDUCTION: This exam was performed according to the departmental dose-optimization program which includes  automated exposure control, adjustment of the mA and/or kV according to patient size and/or use of iterative reconstruction technique. CONTRAST:  OMNIPAQUE IOHEXOL 300 MG/ML  SOLN COMPARISON:  Chest radiograph from earlier today. 12/09/2016 chest CT. 10/09/2016 CT chest, abdomen and pelvis. FINDINGS: CT CHEST FINDINGS Cardiovascular: Normal heart size. No significant pericardial effusion/thickening. Atherosclerotic nonaneurysmal thoracic aorta. Normal caliber pulmonary arteries. Acute segmental pulmonary embolism in the lingula (series 5/image 70). No more central PE. Mediastinum/Nodes: Hypodense 1.4 cm right thyroid nodule. No follow-up imaging is recommended.Unremarkable esophagus. No pathologically enlarged axillary, mediastinal or hilar lymph nodes. Lungs/Pleura: No pneumothorax. No pleural effusion. No acute consolidative airspace disease, lung masses or significant pulmonary nodules. Mild diffuse bronchial wall thickening. Musculoskeletal: No aggressive appearing focal osseous lesions. Moderate thoracic spondylosis. CT ABDOMEN PELVIS FINDINGS Hepatobiliary: Normal liver size. Lobulated benign-appearing 4.6 cm posterior right liver cyst with additional scattered subcentimeter hypodense right liver lesions that are too small to characterize. Normal gallbladder with no radiopaque cholelithiasis. No biliary ductal dilatation. Pancreas: Normal, with no mass or duct dilation. Spleen: Normal size spleen. Subcentimeter hypodense superior splenic lesion is too small to characterize. No additional splenic lesions. Adrenals/Urinary Tract: Normal adrenals. No hydronephrosis. Numerous scattered simple bilateral renal cysts up to the 3.6 cm in the upper left kidney with numerous additional subcentimeter hypodense bilateral renal cortical lesions that are too small to characterize, for which no follow-up imaging is recommended. 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 Normal bladder.  Stomach/Bowel: Normal non-distended stomach. Normal caliber small bowel with no small bowel wall thickening. Appendix not discretely visualized. Marked left colonic diverticulosis, most prominent in the sigmoid colon. Chronic wall thickening in the proximal sigmoid colon, which appears chronically adherent to the left ovary, not substantially changed from 10/09/2016 CT abdomen/pelvis study, favoring chronic diverticulosis. No acute large bowel wall thickening or acute pericolonic fat stranding. Vascular/Lymphatic: Atherosclerotic nonaneurysmal abdominal aorta. Patent portal, splenic, hepatic and renal veins. No pathologically enlarged lymph nodes in the abdomen or pelvis. Reproductive: Status post hysterectomy, with no abnormal findings at the vaginal cuff. No adnexal mass. Other: No pneumoperitoneum, ascites or focal fluid collection. Musculoskeletal: No aggressive appearing focal osseous lesions. Moderate lumbar spondylosis. IMPRESSION: 1. Incidental acute segmental pulmonary embolism in the lingula. No  more central PE. 2. No acute pulmonary parenchymal abnormality. Mild diffuse bronchial wall thickening, suggesting bronchitis. 3. No acute abnormality in the abdomen or pelvis. 4. Marked left colonic diverticulosis, most prominent in the sigmoid colon. Chronic wall thickening in the proximal sigmoid colon, which appears chronically adherent to the left ovary, not substantially changed from 10/09/2016 CT abdomen/pelvis study, favoring chronic diverticulosis. 5. 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 6.  Aortic Atherosclerosis (ICD10-I70.0). Critical Value/emergent results were called by telephone at the time of interpretation on 07/17/2023 at 2:48 pm to provider [facility] , who verbally acknowledged these results. Electronically Signed   By: Delbert Phenix M.D.   On: 07/17/2023 14:50   CT Head Wo Contrast Result Date: 07/17/2023 CLINICAL DATA:  Mental status change, unknown  cause. Recent diagnosis with influenza fever. EXAM: CT HEAD WITHOUT CONTRAST TECHNIQUE: Contiguous axial images were obtained from the base of the skull through the vertex without intravenous contrast. RADIATION DOSE REDUCTION: This exam was performed according to the departmental dose-optimization program which includes automated exposure control, adjustment of the mA and/or kV according to patient size and/or use of iterative reconstruction technique. COMPARISON:  CT head without contrast 05/15/2023 FINDINGS: Brain: No acute infarct, hemorrhage, or mass lesion is present. Moderate atrophy and diffuse white matter disease is stable. Basal ganglia are stable. The ventricles are of normal size. No significant extraaxial fluid collection is present. Atherosclerotic calcifications are present at the cavernous internal carotid arteries bilaterally. No other significant edema is present. Midline structures are within normal limits. The brainstem and cerebellum are within normal limits. Vascular: Minimal atherosclerotic calcifications are present within the cavernous internal carotid arteries bilaterally. No hyperdense vessel is present. Skull: Calvarium is intact. Calvarium is intact. No focal lytic or blastic lesions are present. No significant extracranial soft tissue lesion is present. Sinuses/Orbits: Minimal mucosal thickening is present along the inferior right maxillary sinus and inferior right frontal sinus. No fluid levels are present. The paranasal sinuses and mastoid air cells are otherwise clear. Bilateral lens replacements are noted. Globes and orbits are otherwise unremarkable. IMPRESSION: 1. No acute intracranial abnormality or significant interval change. 2. Stable atrophy and diffuse white matter disease. This likely reflects the sequela of chronic microvascular ischemia. 3. Minimal right maxillary and frontal sinus disease. Electronically Signed   By: Marin Roberts M.D.   On: 07/17/2023 14:37    DG Chest Port 1 View Result Date: 07/17/2023 CLINICAL DATA:  Fever.  Diagnosed with flu yesterday. EXAM: PORTABLE CHEST 1 VIEW COMPARISON:  05/15/2023 FINDINGS: Stable cardiomediastinal silhouette. Aortic atherosclerotic calcification. Low lung volumes accentuate pulmonary vascularity. Left basilar atelectasis. Otherwise no focal consolidation, pleural effusion, or pneumothorax. No displaced rib fractures. IMPRESSION: Low lung volumes with left basilar atelectasis. Electronically Signed   By: Minerva Fester M.D.   On: 07/17/2023 12:36    Catarina Hartshorn, DO  [facility]  If 7PM-7AM, please contact night-coverage [email or web address] Password [email or web address] 07/19/2023, 4:15 PM   LOS: 0 days

## 2023-07-19 NOTE — Progress Notes (Signed)
SLP Cancellation Note  Patient Details Name: [name] MRN: [id number] DOB: [birth year]   Cancelled treatment:       Reason Eval/Treat Not Completed: Patient's level of consciousness; Pt known to this SLP from previous outpatient therapy for Parkinson's/speech therapy. She had MBSS 04/06/2023 due to coughing while drinking liquids and a regular diet with thin liquids, pills whole in puree, was recommended (avoid straws when fatigued). SLP spoke with Pt's daughter who indicated that Pt typically consumes about 2 meals per day- a late breakfast and a late lunch and eats regular textures and usually feeds herself with some coughing with intake. SLP unable to rouse Pt sufficiently to complete bedside swallow evaluation despite max multimodality methods. OK to continue diet as ordered and feed Pt only when alert and upright and present medication whole in puree. SLP will check back tomorrow for BSE.  Thank you,  Havery Moros, CCC-SLP [phone]    Jalin Erpelding 07/19/2023, 5:58 PM

## 2023-07-20 ENCOUNTER — Inpatient Hospital Stay (HOSPITAL_COMMUNITY): Payer: Medicare HMO

## 2023-07-20 DIAGNOSIS — I2699 Other pulmonary embolism without acute cor pulmonale: Secondary | ICD-10-CM | POA: Diagnosis not present

## 2023-07-20 LAB — COMPREHENSIVE METABOLIC PANEL
ALT: 6 U/L (ref 0–44)
AST: 12 U/L — ABNORMAL LOW (ref 15–41)
Albumin: 2.6 g/dL — ABNORMAL LOW (ref 3.5–5.0)
Alkaline Phosphatase: 64 U/L (ref 38–126)
Anion gap: 12 (ref 5–15)
BUN: 56 mg/dL — ABNORMAL HIGH (ref 8–23)
CO2: 26 mmol/L (ref 22–32)
Calcium: 8.5 mg/dL — ABNORMAL LOW (ref 8.9–10.3)
Chloride: 101 mmol/L (ref 98–111)
Creatinine, Ser: 1.98 mg/dL — ABNORMAL HIGH (ref 0.44–1.00)
GFR, Estimated: 25 mL/min — ABNORMAL LOW (ref >60)
Glucose, Bld: 156 mg/dL — ABNORMAL HIGH (ref 70–99)
Potassium: 3.9 mmol/L (ref 3.5–5.1)
Sodium: 139 mmol/L (ref 135–145)
Total Bilirubin: 0.4 mg/dL (ref 0.0–1.2)
Total Protein: 6.1 g/dL — ABNORMAL LOW (ref 6.5–8.1)

## 2023-07-20 LAB — CBC
HCT: 27.3 % — ABNORMAL LOW (ref 36.0–46.0)
Hemoglobin: 8.9 g/dL — ABNORMAL LOW (ref 12.0–15.0)
MCH: 30 pg (ref 26.0–34.0)
MCHC: 32.6 g/dL (ref 30.0–36.0)
MCV: 91.9 fL (ref 80.0–100.0)
Platelets: 192 10*3/uL (ref 150–400)
RBC: 2.97 MIL/uL — ABNORMAL LOW (ref 3.87–5.11)
RDW: 16.7 % — ABNORMAL HIGH (ref 11.5–15.5)
WBC: 24.4 10*3/uL — ABNORMAL HIGH (ref 4.0–10.5)
nRBC: 0.1 % (ref 0.0–0.2)

## 2023-07-20 LAB — HEPARIN LEVEL (UNFRACTIONATED): Heparin Unfractionated: 0.56 [IU]/mL (ref 0.30–0.70)

## 2023-07-20 LAB — GLUCOSE, CAPILLARY
Glucose-Capillary: 163 mg/dL — ABNORMAL HIGH (ref 70–99)
Glucose-Capillary: 152 mg/dL — ABNORMAL HIGH (ref 70–99)
Glucose-Capillary: 148 mg/dL — ABNORMAL HIGH (ref 70–99)

## 2023-07-20 LAB — MRSA NEXT GEN BY PCR, NASAL: MRSA by PCR Next Gen: NOT DETECTED

## 2023-07-20 LAB — C-REACTIVE PROTEIN: CRP: 14.9 mg/dL — ABNORMAL HIGH (ref <1.0)

## 2023-07-20 LAB — FERRITIN: Ferritin: 225 ng/mL (ref 11–307)

## 2023-07-20 MED ORDER — LORAZEPAM 2 MG/ML PO CONC: 1.0000 mg | ORAL | Status: DC | PRN

## 2023-07-20 MED ORDER — SODIUM CHLORIDE 0.9 % IV SOLN
100.0000 mg | INTRAVENOUS | Status: AC
  Administered 2023-07-20: 100 mg via INTRAVENOUS
  Filled 2023-07-20: qty 100
  Filled 2023-07-20: qty 20

## 2023-07-20 MED ORDER — SODIUM CHLORIDE 0.9 % IV SOLN
100.0000 mg | INTRAVENOUS | Status: AC
  Administered 2023-07-20: 100 mg via INTRAVENOUS
  Filled 2023-07-20: qty 20

## 2023-07-20 MED ORDER — SODIUM CHLORIDE 0.9 % IV SOLN: 200.0000 mg | Freq: Once | INTRAVENOUS | Status: DC

## 2023-07-20 MED ORDER — LORAZEPAM 2 MG/ML IJ SOLN: 1.0000 mg | INTRAMUSCULAR | Status: DC | PRN

## 2023-07-20 MED ORDER — HYDROMORPHONE HCL 1 MG/ML IJ SOLN
0.5000 mg | INTRAMUSCULAR | Status: DC | PRN
  Administered 2023-07-21: 1 mg via INTRAVENOUS
  Administered 2023-07-21: 0.5 mg via INTRAVENOUS
  Filled 2023-07-20 (×2): qty 1

## 2023-07-20 MED ORDER — CHLORHEXIDINE GLUCONATE CLOTH 2 % EX PADS
6.0000 | MEDICATED_PAD | Freq: Every day | CUTANEOUS | Status: DC
  Administered 2023-07-20: 6 via TOPICAL

## 2023-07-20 MED ORDER — LORAZEPAM 1 MG PO TABS: 1.0000 mg | ORAL_TABLET | ORAL | Status: DC | PRN

## 2023-07-20 MED ORDER — HEPARIN (PORCINE) 25000 UT/250ML-% IV SOLN
650.0000 [IU]/h | INTRAVENOUS | Status: DC
  Administered 2023-07-20: 650 [IU]/h via INTRAVENOUS

## 2023-07-20 MED ORDER — HYDRALAZINE HCL 20 MG/ML IJ SOLN: 5.0000 mg | INTRAMUSCULAR | Status: DC | PRN

## 2023-07-20 MED ORDER — SODIUM CHLORIDE 0.9 % IV SOLN
100.0000 mg | Freq: Every day | INTRAVENOUS | Status: DC
  Administered 2023-07-20: 100 mg via INTRAVENOUS
  Filled 2023-07-20: qty 100

## 2023-07-20 MED ORDER — REMDESIVIR 100 MG IV SOLR: 100.0000 mg | Freq: Every day | INTRAVENOUS | Status: DC

## 2023-07-20 MED ORDER — BIOTENE DRY MOUTH MT LIQD: 15.0000 mL | OROMUCOSAL | Status: DC | PRN

## 2023-07-20 MED ORDER — SODIUM CHLORIDE 0.9 % IV BOLUS
500.0000 mL | Freq: Once | INTRAVENOUS | Status: AC
  Administered 2023-07-20: 500 mL via INTRAVENOUS

## 2023-07-20 MED ORDER — SCOPOLAMINE 1 MG/3DAYS TD PT72
1.0000 | MEDICATED_PATCH | TRANSDERMAL | Status: DC
  Administered 2023-07-20: 1.5 mg via TRANSDERMAL
  Filled 2023-07-20: qty 1

## 2023-07-20 MED ORDER — SODIUM CHLORIDE 0.9 % IV SOLN: INTRAVENOUS | Status: DC

## 2023-07-20 NOTE — Progress Notes (Signed)
TRH ROUNDING  NOTE [name] ZOX:[id number]  DOB: Mar 27, 1941  DOA: 07/17/2023  PCP: Benita Stabile, MD  07/20/2023,10:45 AM   LOS: 1 day      Code Status: full  From: Home  current Dispo: Likely home   83 year old female known history of right total knee arthroplasty 2019 Gout, HTN, HLD, Parkinson's disease Recently bedbound for over a month and was barely mobile using a wheelchair  Admitted from home to 02/25/2024 generalized body pain weakness lethargy was diagnosed with the flu as an outpatient allegedly but here COVID was positive Sodium 140 potassium 3.2 BUN/creatinine 23/0.7 BNP 107 lactic acid 0.9 WBC 10.0 hemoglobin 10.0 platelet 193 UA negative blood cultures performed and negative so far Chest x-ray low lung volumes distal atelectasis As D-dimer was elevated >20 CT chest was performed-this showed incidental acute segmental PE and lingula no central PE no other parenchymal abnormality, marked left diverticulosis 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 until speech evaluates other than meds and meds that are absolutely required would be her carbidopa as patient may have more of phenomenon without this I have had detailed discussion with family--- patient has been bedbound for a while which is an independent risk factor and patient has had workup for dysphagia several times last in October 2024 and it was not felt that she was aspirating AKI Bladder scan earlier today and only 78 retained She is putting out only small amounts of urine and may not be keeping up or taking in enough so we will start fluids at 100  cc/H Repeat labs Acute pulmonary embolism secondary to immobility/COVID Paxlovid sensation to remdesivir complete therapy on 2/13 Will transition to Lovenox for ease of administration last lab draws COVID-19 continues on Solu-Medrol 60 every 12 and will need at least 10 days of therapy ending 2/18 Will need isolation and quarantine for at least 5 to 10 days Gout Hold allopurinol 100 for now HTN Hold amlodipine 5 do not resume chlorthalidone 12.5-hydralazine for blood pressure >180 Parkinson's Continue carbidopa 1 tab 4 times daily, continues to gabapentin 300 twice daily for neuropathic component to the discomfort   Detail discussion with patient's spouse and daughter at the bedside-I have explained that patient may be aspirating given coughing spell-workup to be ensued-if worsening symptoms despite ability to take carbidopa etc. may need to have more of a palliative discussion  DVT prophylaxis: Lovenox therapeutic  Status is: Inpatient Remains inpatient appropriate because:   Remains ill   Subjective: Nursing reports has been coughing but was able to keep tablets down She is quite lethargic to my exam-daughter relates they were not able to get the carbidopa until yesterday  Objective + exam Vitals:   07/19/23 1413 07/19/23 2100 07/19/23 2300 07/20/23 0430  BP: 114/62  105/64 120/63  Pulse: 95  94 92  Resp: 20  19 (!) 21  Temp: 97.9 F (36.6 C) 100.1 F (37.8 C) 97.6 F (36.4 C) (!) 100.5 F (38.1 C)  TempSrc: Oral Axillary Oral Oral  SpO2: 93%  92% 92%  Weight:      Height:  Filed Weights   07/17/23 1152  Weight: 63.5 kg    Examination: EOMI NCAT no focal deficit no icterus no pallor Chest decreased air entry posterolaterally on sitting up S1-S2 no murmur she is in sinus rhythm at about 100 Abdomen is soft no rebound No lower extremity edema  Data Reviewed: reviewed   CBC    Component Value Date/Time   WBC 24.4 (H) 07/20/2023 0425   RBC 2.97 (L)  07/20/2023 0425   HGB 8.9 (L) 07/20/2023 0425   HCT 27.3 (L) 07/20/2023 0425   PLT 192 07/20/2023 0425   MCV 91.9 07/20/2023 0425   MCH 30.0 07/20/2023 0425   MCHC 32.6 07/20/2023 0425   RDW 16.7 (H) 07/20/2023 0425   LYMPHSABS 0.9 07/17/2023 1246   MONOABS 0.6 07/17/2023 1246   EOSABS 0.0 07/17/2023 1246   BASOSABS 0.0 07/17/2023 1246      Latest Ref Rng & Units 07/20/2023    4:25 AM 07/19/2023    4:29 AM 07/18/2023    4:25 AM  CMP  Glucose 70 - 99 mg/dL 161  096  98   BUN 8 - 23 mg/dL 56  35  22   Creatinine 0.44 - 1.00 mg/dL 0.45  4.09  8.11   Sodium 135 - 145 mmol/L 139  138  139   Potassium 3.5 - 5.1 mmol/L 3.9  4.1  3.7   Chloride 98 - 111 mmol/L 101  102  103   CO2 22 - 32 mmol/L 26  26  26    Calcium 8.9 - 10.3 mg/dL 8.5  8.6  8.6   Total Protein 6.5 - 8.1 g/dL 6.1  6.5    Total Bilirubin 0.0 - 1.2 mg/dL 0.4  0.4    Alkaline Phos 38 - 126 U/L 64  72    AST 15 - 41 U/L 12  13    ALT 0 - 44 U/L 6  5      Scheduled Meds:  allopurinol  100 mg Oral Daily   amLODipine  5 mg Oral Daily   carbidopa-levodopa  1 tablet Oral QID   gabapentin  300 mg Oral BID   insulin aspart  0-5 Units Subcutaneous QHS   insulin aspart  0-9 Units Subcutaneous TID WC   methylPREDNISolone (SOLU-MEDROL) injection  60 mg Intravenous Q12H   nirmatrelvir/ritonavir (renal dosing)  2 tablet Oral BID   Continuous Infusions:  heparin 650 Units/hr (07/20/23 0122)    Time 46  Rhetta Mura, MD  [facility]

## 2023-07-20 NOTE — Plan of Care (Signed)
  Problem: Pain Managment: Goal: General experience of comfort will improve and/or be controlled Outcome: Progressing

## 2023-07-20 NOTE — TOC Progression Note (Signed)
Transition of Care [facility]) - Progression Note    Patient Details  Name: [name] MRN: [id number] Date of Birth: 04-20-1941  Transition of [facility]) CM/SW Contact  Karn Cassis, [place] Phone Number: 07/20/2023, 12:10 PM  Clinical Narrative: Discussed with daughter that [facility] is only SNF so far that does not have 10 day quarantine requirement due to COVID+. Daughter accepts bed offer at [facility]. Per MD, anticipate possible d/c Friday. Will start auth tomorrow if this is still plan.       Expected Discharge Plan: Skilled Nursing Facility Barriers to Discharge: Continued Medical Work up  Expected Discharge Plan and Services In-house Referral: Clinical Social Work   Post Acute Care Choice: Skilled Nursing Facility Living arrangements for the past 2 months: Single Family Home                                       Social Determinants of Health (SDOH) Interventions SDOH Screenings   Food Insecurity: No Food Insecurity (07/17/2023)  Housing: Low Risk  (07/17/2023)  Transportation Needs: No Transportation Needs (07/17/2023)  Utilities: Not At Risk (07/17/2023)  Social Connections: Unknown (07/17/2023)  Tobacco Use: Low Risk  (07/17/2023)    Readmission Risk Interventions     No data to display

## 2023-07-20 NOTE — Progress Notes (Signed)
PT Cancellation Note  Patient Details Name: [name] MRN: [id number] DOB: 08-14-1940   Cancelled Treatment:    Reason Eval/Treat Not Completed: Medical issues which prohibited therapy.  Patient transferred to a higher level of care and will need new PT consult to resume therapy when patient is medically stable.  Thank you.    2:54 PM, 07/20/23 Ocie Bob, MPT Physical Therapist with [facility] [phone] office [phone] mobile phone

## 2023-07-20 NOTE — Evaluation (Signed)
Clinical/Bedside Swallow Evaluation Patient Details  Name: [name] MRN: [id number] Date of Birth: [birth year]  Today's Date: 07/20/2023 Time: SLP Start Time (ACUTE ONLY): 1215 SLP Stop Time (ACUTE ONLY): 1234 SLP Time Calculation (min) (ACUTE ONLY): 19 min  Past Medical History:  Past Medical History:  Diagnosis Date   Aortic atherosclerosis (HCC) 09/11/2016   noted on CXR   Arthritis    Baker's cyst, left 2014   Small, Left   Bilateral leg numbness    Cataract    Bilateral   Diabetes mellitus without complication (HCC)    Diverticulosis 10/09/2016   Noted on CT abd/pelvis   Gout    Grade I diastolic dysfunction 09/12/2016   Noted on ECHO   Hepatic cyst 10/09/2016   Noted on CT abd/pelvis   History of iron deficiency anemia    History of migraine    Hypercholesteremia    Hypertension    Internal hemorrhoids    Lateral meniscal tear 2014   Left   LVH (left ventricular hypertrophy) 03/03/2018   noted on EKG   Obesity    PMB (postmenopausal bleeding)    Pneumonia 2018   Right upper lower   PONV (postoperative nausea and vomiting)    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   Right 1.3 cm, Noted on CT Chest, pt unaware   Renal cyst 10/09/2016   Noted on CT abd/pelvis   Thoracic aortic aneurysm (HCC) 12/09/2016   ascending 4.0 cm, noted on CT chest, pt unaware   Past Surgical History:  Past Surgical History:  Procedure Laterality Date   ABDOMINAL HYSTERECTOMY     CATARACT EXTRACTION Right 09/2010   CATARACT EXTRACTION Left 10/2010   COLONOSCOPY     DILATION AND CURETTAGE OF UTERUS     TOTAL KNEE ARTHROPLASTY Right 04/21/2018   Procedure: TOTAL KNEE ARTHROPLASTY;  Surgeon: Frederico Hamman, MD;  Location: [facility]S;  Service: Orthopedics;  Laterality: Right;  Adductor Block   HPI:  83 y.o. female with medical history significant for Parkinson's, diabetes mellitus, hypertension, gout.  Patient was brought to the ED reports of generalized weakness, fever, body  aches, and cough.  Symptoms started yesterday.    Patient denies chest pain, no urinary symptoms, no vomiting no diarrhea no abdominal pain.  No history of blood clots.  She has been bedbound over the past month, prior to this she was barely mobile sometimes using her wheelchair. Head CT without acute abnormality, shows sequelae of chronic microvascular ischemia.  CT chest abdomen pelvis with contrast incidental acute segmental PE in the lingula, also findings suggestive of bronchitis, and chronic diverticulosis.  Heparin drip started. BSE requested.    Assessment / Plan / Recommendation  Clinical Impression  Limited clinical swallow evaluation completed this date after attempted last night as well. Pt continues to present with lethargy, which negatively impacts safety with PO intake. Pt roused briefly and moved lips and opened eyes. SLP presented ice chip to lip and tongue with no reaction. Oral care completed. Recommend NPO except for ice chips after oral care and small sips of water if Pt is alert and upright. Consider trying to at least give Pt her Parkinson's medications whole in a small amount of ice cream/puree after presenting her with an ice chip. Pt is at risk for aspiration and inability to meet nutritional needs due to lethargy. Above to family, nursing, and MD. SLP wil continue to follow in acute setting. SLP Visit Diagnosis: Dysphagia, unspecified (R13.10)    Aspiration Risk  Moderate aspiration risk;Risk for inadequate nutrition/hydration    Diet Recommendation NPO except meds;Ice chips PRN after oral care    Medication Administration: Via alternative means    Other  Recommendations Oral Care Recommendations: Oral care prior to ice chip/H20;Staff/trained caregiver to provide oral care Caregiver Recommendations: Have oral suction available    Recommendations for follow up therapy are one component of a multi-disciplinary discharge planning process, led by the attending physician.   Recommendations may be updated based on patient status, additional functional criteria and insurance authorization.  Follow up Recommendations Skilled nursing-short term rehab (<3 hours/day)      Assistance Recommended at Discharge    Functional Status Assessment Patient has had a recent decline in their functional status and demonstrates the ability to make significant improvements in function in a reasonable and predictable amount of time.  Frequency and Duration min 2x/week  1 week       Prognosis Prognosis for improved oropharyngeal function: Fair Barriers to Reach Goals:  (lethargy)      Swallow Study   General Date of Onset: 07/17/23 HPI: 83 y.o. female with medical history significant for Parkinson's, diabetes mellitus, hypertension, gout.  Patient was brought to the ED reports of generalized weakness, fever, body aches, and cough.  Symptoms started yesterday.    Patient denies chest pain, no urinary symptoms, no vomiting no diarrhea no abdominal pain.  No history of blood clots.  She has been bedbound over the past month, prior to this she was barely mobile sometimes using her wheelchair. Head CT without acute abnormality, shows sequelae of chronic microvascular ischemia.  CT chest abdomen pelvis with contrast incidental acute segmental PE in the lingula, also findings suggestive of bronchitis, and chronic diverticulosis.  Heparin drip started. BSE requested. Type of Study: Bedside Swallow Evaluation Previous Swallow Assessment: MBSS 04/06/2023 reg/thin, pills in puree Diet Prior to this Study: NPO Temperature Spikes Noted: No Respiratory Status: Room air History of Recent Intubation: No Behavior/Cognition: Lethargic/Drowsy Oral Cavity Assessment: Within Functional Limits Oral Care Completed by SLP: Yes Oral Cavity - Dentition: Adequate natural dentition Vision: Impaired for self-feeding Self-Feeding Abilities: Total assist;Other (Comment) (not alert) Patient Positioning:  Upright in bed Baseline Vocal Quality: Low vocal intensity Volitional Cough: Cognitively unable to elicit Volitional Swallow: Unable to elicit    Oral/Motor/Sensory Function Overall Oral Motor/Sensory Function: Generalized oral weakness   Ice Chips Ice chips: Impaired Presentation: Spoon Oral Phase Impairments: Reduced labial seal;Poor awareness of bolus;Reduced lingual movement/coordination Oral Phase Functional Implications: Other (comment) (No lingual movement) Pharyngeal Phase Impairments: Unable to trigger swallow   Thin Liquid Thin Liquid: Not tested    Nectar Thick Nectar Thick Liquid: Not tested   Honey Thick Honey Thick Liquid: Not tested   Puree Puree: Not tested   Solid     Solid: Not tested     Thank you,  Havery Moros, CCC-SLP [phone]  Tradarius Reinwald 07/20/2023,1:02 PM

## 2023-07-20 NOTE — Progress Notes (Signed)
Night time meds were crushed and placed in small amount of apple sauce. Tried to coach pt to swallow a spoon amount of meds. Pt unable to swallow. Daughter in room and informed RN that mother can take meds whole at home and this is not mom's baseline. Attempted to coach pt to swallow meds. Pt just pocketed them in her mouth. Withheld the rest of the meds due to inability to swallow medication to avoid risk of aspiration. Explained reasoning to daughter at bedside and she verbalized understanding.

## 2023-07-20 NOTE — Progress Notes (Signed)
PHARMACY - ANTICOAGULATION CONSULT NOTE  Pharmacy Consult for heparin Indication: pulmonary embolus  No Known Allergies  Patient Measurements: Height: 5\' 2"  (157.5 cm) Weight: 63.5 kg (139 lb 15.9 oz) IBW/kg (Calculated) : 50.1 Heparin Dosing Weight: 63 kg  Vital Signs: Temp: 100.5 F (38.1 C) (02/12 0430) Temp Source: Oral (02/12 0430) BP: 120/63 (02/12 0430) Pulse Rate: 92 (02/12 0430)  Labs: Recent Labs    07/17/23 1246 07/17/23 2318 07/18/23 0425 07/18/23 1810 07/19/23 0429 07/19/23 1414 07/20/23 0425  HGB 10.0*  --  9.9*  --  9.7*  --  8.9*  HCT 30.7*  --  32.0*  --  30.0*  --  27.3*  PLT 193  --  190  --  192  --  192  APTT 29  --   --   --   --   --   --   LABPROT 14.3  --   --   --   --   --   --   INR 1.1  --   --   --   --   --   --   HEPARINUNFRC  --    < > 0.85*   < > 0.66 0.52 0.56  CREATININE 0.79  --  0.75  --  1.05*  --  1.98*   < > = values in this interval not displayed.   Estimated Creatinine Clearance: 19.2 mL/min (A) (by C-G formula based on SCr of 1.98 mg/dL (H)).  Medical History: Past Medical History:  Diagnosis Date   Aortic atherosclerosis (HCC) 09/11/2016   noted on CXR   Arthritis    Baker's cyst, left 2014   Small, Left   Bilateral leg numbness    Cataract    Bilateral   Diabetes mellitus without complication (HCC)    Diverticulosis 10/09/2016   Noted on CT abd/pelvis   Gout    Grade I diastolic dysfunction 09/12/2016   Noted on ECHO   Hepatic cyst 10/09/2016   Noted on CT abd/pelvis   History of iron deficiency anemia    History of migraine    Hypercholesteremia    Hypertension    Internal hemorrhoids    Lateral meniscal tear 2014   Left   LVH (left ventricular hypertrophy) 03/03/2018   noted on EKG   Obesity    PMB (postmenopausal bleeding)    Pneumonia 2018   Right upper lower   PONV (postoperative nausea and vomiting)    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   07/17/2023 at  9:00 AM   allopurinol (ZYLOPRIM) 100 MG tablet Take 100 mg by mouth daily.   07/16/2023 Morning   amLODipine (NORVASC) 5 MG tablet Take 5 mg by mouth daily.    07/16/2023 Bedtime   carbidopa-levodopa (SINEMET IR) 25-100 MG tablet Take 1 tablet by mouth 4 (four) times daily. At 6 AM, 10 AM, 2 PM and 6 PM 360 tablet 3 07/16/2023 Evening   chlorthalidone (HYGROTON) 25 MG tablet Take 12.5 mg by mouth daily.  07/16/2023 Morning   Cholecalciferol (VITAMIN D-3 PO) Take 1 tablet by mouth daily.   07/16/2023 Morning   gabapentin (NEURONTIN) 300 MG capsule Take 1 capsule (300 mg total) by mouth 3 (three) times daily. Start with 1 capsule before bed for 5 days, may then add a second capsule at dinner for 5 days, if needed may add a third capsule in the morning for a total of three capsules a day. (Patient taking differently: Take 300 mg by mouth 2 (two) times daily.) 90 capsule 5 07/16/2023 Bedtime   metFORMIN (GLUCOPHAGE) 500 MG tablet Take 500 mg by mouth daily with breakfast.   07/16/2023 Bedtime   olmesartan (BENICAR) 40 MG tablet Take 40 mg by mouth daily.   07/16/2023 Morning   simvastatin (ZOCOR) 40 MG tablet Take 40 mg by mouth every evening.   07/16/2023 Bedtime   Assessment: Pharmacy consulted to dose heparin in patient with pulmonary embolism.  Patient is not on anticoagulation prior to admission.  HL 0.56, remains therapeutic. No issues with infusion. Currently on Paxlovid, completes tx 2/14, then can transition to po tx.   Goal of Therapy:  Heparin level 0.3-0.7 units/ml Monitor platelets by anticoagulation  protocol: Yes   Plan:  Continue heparin infusion at 650 units/hr Check anti-Xa level daily while on heparin Continue to monitor H&H and platelets  Judeth Cornfield, PharmD Clinical Pharmacist 07/20/2023 7:59 AM

## 2023-07-20 NOTE — Progress Notes (Signed)
Pt did not produce urine during the night. Bladder scan revealed .

## 2023-07-20 NOTE — IPOC Note (Signed)
Discussed several times today with family member at bedside husband as well as patient's daughter Dennis Bast is an LPN] the sudden trajectory change of her illness and rapid decline At this point daughter feels that we should pursue comfort care I believe that her prognosis is grim and that she will not survive the next 24 hours We will transition to comfort care I will start comfort measures with IV morphine and de-escalate off of all other nonessential/nonmeaningful interventions that are inconsistent with hospice  > 45 minutes extra time coordinating palliative care

## 2023-07-20 NOTE — IPAL (Signed)
Labs and vitals reviewed chest x-ray over read by me shows possible left middle lobe infiltrate Patient relatively unresponsive I had a straightforward conversation with patient's husband at the bedside and he confirms that the patient would not want aggressive resuscitation Given her mild hypotension, I will put her on the stepdown unit for close monitoring We would not escalate care beyond current fluids and antibiotics He wants to talk to his daughter about palliative care and hospice   Pleas Koch, MD [facility] 1:25 PM

## 2023-07-20 NOTE — Plan of Care (Signed)
  Problem: Education: Goal: Knowledge of risk factors and measures for prevention of condition will improve Outcome: Not Progressing   Problem: Coping: Goal: Psychosocial and spiritual needs will be supported Outcome: Not Progressing   Problem: Respiratory: Goal: Will maintain a patent airway Outcome: Not Progressing Goal: Complications related to the disease process, condition or treatment will be avoided or minimized Outcome: Not Progressing   Patient too lethargic for teaching at this time

## 2023-07-21 DIAGNOSIS — I2699 Other pulmonary embolism without acute cor pulmonale: Secondary | ICD-10-CM | POA: Diagnosis not present

## 2023-07-21 MED ORDER — MORPHINE 100MG IN NS 100ML (1MG/ML) PREMIX INFUSION
1.0000 mg/h | INTRAVENOUS | Status: DC
  Administered 2023-07-21: 1 mg/h via INTRAVENOUS
  Filled 2023-07-21: qty 100

## 2023-07-22 LAB — CULTURE, BLOOD (ROUTINE X 2)
Special Requests: ADEQUATE
Special Requests: ADEQUATE

## 2023-08-06 NOTE — TOC Progression Note (Addendum)
Transition of Care [facility]) - Progression Note    Patient Details  Name: [name] MRN: [id number] Date of Birth: June 23, 1940  [facility]) CM/SW Contact  Elliot Gault, LCSW Phone Number: 07/09/2023, 1:33 PM  Clinical Narrative:     TOC following. Pt had a decline in status overnight. Per MD, family now requesting plan for dc home tomorrow with hospice if pt remains with [place] tomorrow.  Spoke with pt's husband to review. He confirms they would like hospice care at home. CMS provider options reviewed. Referred to [facility] as requested. Samantha at [place] states she will reach out to pt's husband and they will arrange for delivery of any needed DME for tomorrow AM once pt's status is confirmed in the AM.  Updated Tiffany at [facility] of change in plan.  TOC will follow.  Expected Discharge Plan: Home w Hospice Care Barriers to Discharge: Continued Medical Work up  Expected Discharge Plan and Services In-house Referral: Clinical Social Work   Post Acute Care Choice: Hospice Living arrangements for the past 2 months: Single Family Home                                       Social Determinants of Health (SDOH) Interventions SDOH Screenings   Food Insecurity: No Food Insecurity (07/17/2023)  Housing: Low Risk  (07/17/2023)  Transportation Needs: No Transportation Needs (07/17/2023)  Utilities: Not At Risk (07/17/2023)  Social Connections: Unknown (07/17/2023)  Tobacco Use: Low Risk  (07/17/2023)    Readmission Risk Interventions     No data to display

## 2023-08-06 NOTE — Progress Notes (Signed)
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 Gout, HTN, HLD, Parkinson's disease Recently bedbound for over a month and was barely mobile using a wheelchair   Admitted from home to 02/25/2024 generalized body pain weakness lethargy was diagnosed with the flu as an outpatient allegedly but here COVID was positive Sodium 140 potassium 3.2 BUN/creatinine 23/0.7 BNP 107 lactic acid 0.9 WBC 10.0 hemoglobin 10.0 platelet 193 UA negative blood cultures performed and negative so far Chest x-ray low lung volumes distal atelectasis As D-dimer was elevated >20 CT chest was performed-this showed incidental acute segmental PE and lingula no central PE no other parenchymal abnormality, marked left diverticulosis 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 CT head no acute intracranial abnormality or significant interval change stable atrophy diffuse white matter disease  The patient was initially treated for acute pulmonary embolism with heparin and was given Paxlovid started on IV Solu-Medrol and subsequently decompensated on 2/12 with worsening hypoxia and respiratory distress culminating in what look like impending respiratory failure IPAL undertaken with patient and family and the daughter who is an LPN as well as the husband who is the main decision maker elected to pursue eventually comfort care after precipitous decline Patient was placed on comfort medications trajectory you was changed to full comfort and patient ultimately passed at around 1652 on 08-12-2023 Patient was pronounced by nursing staff chaplain was informed and came to bedside   Final Diagnoses:  Acute respiratory failure  secondary to aspiration pneumonia COVID-19 Acute pulmonary embolism secondary to COVID      The results of significant diagnostics from this hospitalization (including imaging, microbiology, ancillary and laboratory) are listed below for reference.    Significant Diagnostic Studies: DG CHEST PORT 1 VIEW Result Date: 07/20/2023 CLINICAL DATA:  Fever EXAM: PORTABLE CHEST 1 VIEW COMPARISON:  08/08/2023, CT 08-Aug-2023 FINDINGS: Hypoventilatory changes. Interval patchy airspace disease at left greater than right lung base. Possible small left effusion. Stable cardiomediastinal silhouette with aortic atherosclerosis. No pneumothorax IMPRESSION: Interval patchy airspace disease at left greater than right lung base suspicious for pneumonia. Possible small left effusion. Electronically Signed   By: Jasmine Pang M.D.   On: 07/20/2023 15:22   ECHOCARDIOGRAM COMPLETE Result Date: 07/19/2023    ECHOCARDIOGRAM REPORT   Patient Name:   [name] Date of Exam: 07/19/2023 Medical Rec #:  [id number]       Height:       62.0 in Accession #:    [id number]      Weight:       140.0 lb Date of Birth:  [birth year]       BSA:          1.643 m Patient Age:    82 years        BP:           143/89 mmHg Patient Gender: F               HR:           92 bpm. [facility] Procedure: 2D Echo, Cardiac Doppler and Color Doppler Indications:    Pulmonary Embolism  History:        Patient has prior  history of Echocardiogram examinations, most                 recent 09/12/2016. Signs/Symptoms:Covid +; Risk                 Factors:Hypertension and Diabetes.  Sonographer:    Amy Chionchio Referring Phys: [id number] Heloise Beecham EMOKPAE IMPRESSIONS  1. Moderate intracavitary gradient of 39 mm Hg noted likely from vigorous contraction of left ventricle. Left ventricular ejection fraction, by estimation, is >75%. The left ventricle has hyperdynamic function. The left ventricle has no regional wall motion abnormalities. Left  ventricular diastolic parameters are consistent with Grade I diastolic dysfunction (impaired relaxation).  2. Right ventricular systolic function was not well visualized. The right ventricular size is normal. There is normal pulmonary artery systolic pressure.  3. The mitral valve is grossly normal. No evidence of mitral valve regurgitation. No evidence of mitral stenosis.  4. The aortic valve is tricuspid. Aortic valve regurgitation is mild. No aortic stenosis is present.  5. The inferior vena cava is normal in size with greater than 50% respiratory variability, suggesting right atrial pressure of 3 mmHg. Comparison(s): No prior Echocardiogram. FINDINGS  Left Ventricle: Left ventricular ejection fraction, by estimation, is >75%. The left ventricle has hyperdynamic function. The left ventricle has no regional wall motion abnormalities. The left ventricular internal cavity size was small. There is no left  ventricular hypertrophy. Left ventricular diastolic parameters are consistent with Grade I diastolic dysfunction (impaired relaxation). Normal left ventricular filling pressure. Right Ventricle: The right ventricular size is normal. No increase in right ventricular wall thickness. Right ventricular systolic function was not well visualized. There is normal pulmonary artery systolic pressure. The tricuspid regurgitant velocity is  2.17 m/s, and with an assumed right atrial pressure of 3 mmHg, the estimated right ventricular systolic pressure is 21.8 mmHg. Left Atrium: Left atrial size was normal in size. Right Atrium: Right atrial size was normal in size. Pericardium: There is no evidence of pericardial effusion. Mitral Valve: The mitral valve is grossly normal. Mild to moderate mitral annular calcification. No evidence of mitral valve regurgitation. No evidence of mitral valve stenosis. MV peak gradient, 6.4 mmHg. The mean mitral valve gradient is 3.0 mmHg. Tricuspid Valve: The tricuspid valve is normal in  structure. Tricuspid valve regurgitation is trivial. No evidence of tricuspid stenosis. Aortic Valve: The aortic valve is tricuspid. Aortic valve regurgitation is mild. No aortic stenosis is present. Aortic valve mean gradient measures 5.0 mmHg. Aortic valve peak gradient measures 7.8 mmHg. Pulmonic Valve: The pulmonic valve was normal in structure. Pulmonic valve regurgitation is not visualized. No evidence of pulmonic stenosis. Aorta: The aortic root and ascending aorta are structurally normal, with no evidence of dilitation. Venous: The inferior vena cava is normal in size with greater than 50% respiratory variability, suggesting right atrial pressure of 3 mmHg. IAS/Shunts: No atrial level shunt detected by color flow Doppler.  LEFT VENTRICLE PLAX 2D LVIDd:         2.00 cm     Diastology LVIDs:         1.30 cm     LV e' medial:    4.57 cm/s LV PW:         0.80 cm     LV E/e' medial:  12.9 LV IVS:        1.50 cm     LV e' lateral:   6.42 cm/s LVOT diam:     2.00 cm     LV E/e'  lateral: 9.2 LVOT Area:     3.14 cm  LV Volumes (MOD) LV vol d, MOD A2C: 45.0 ml LV vol d, MOD A4C: 57.5 ml LV vol s, MOD A2C: 11.3 ml LV vol s, MOD A4C: 13.5 ml LV SV MOD A2C:     33.7 ml LV SV MOD A4C:     57.5 ml LV SV MOD BP:      39.3 ml RIGHT VENTRICLE          IVC RV Basal diam:  3.40 cm  IVC diam: 1.60 cm TAPSE (M-mode): 1.8 cm LEFT ATRIUM             Index        RIGHT ATRIUM           Index LA Vol (A2C):   30.8 ml 18.75 ml/m  RA Area:     12.80 cm LA Vol (A4C):   37.2 ml 22.64 ml/m  RA Volume:   28.70 ml  17.47 ml/m LA Biplane Vol: 37.0 ml 22.52 ml/m  AORTIC VALVE               PULMONIC VALVE AV Vmax:      140.00 cm/s  PV Vmax:       0.79 m/s AV Vmean:     105.000 cm/s PV Peak grad:  2.5 mmHg AV VTI:       0.208 m AV Peak Grad: 7.8 mmHg AV Mean Grad: 5.0 mmHg  AORTA Ao Root diam: 3.10 cm Ao Asc diam:  3.50 cm MITRAL VALVE                TRICUSPID VALVE MV Area (PHT): 3.15 cm     TR Peak grad:   18.8 mmHg MV Peak grad:  6.4  mmHg     TR Vmax:        217.00 cm/s MV Mean grad:  3.0 mmHg MV Vmax:       1.26 m/s     SHUNTS MV Vmean:      76.4 cm/s    Systemic Diam: 2.00 cm MV Decel Time: 241 msec MV E velocity: 59.10 cm/s MV A velocity: 103.00 cm/s MV E/A ratio:  0.57 Vishnu Priya Mallipeddi Electronically signed by Winfield Rast Mallipeddi Signature Date/Time: 07/19/2023/1:46:10 PM    Final    US Venous Img Lower Bilateral (DVT) Result Date: 07/18/2023 CLINICAL DATA:  Pulmonary embolism. Evaluate for lower extremity DVT. EXAM: BILATERAL LOWER EXTREMITY VENOUS DOPPLER ULTRASOUND TECHNIQUE: Gray-scale sonography with graded compression, as well as color Doppler and duplex ultrasound were performed to evaluate the lower extremity deep venous systems from the level of the common femoral vein and including the common femoral, femoral, profunda femoral, popliteal and calf veins including the posterior tibial, peroneal and gastrocnemius veins when visible. The superficial great saphenous vein was also interrogated. Spectral Doppler was utilized to evaluate flow at rest and with distal augmentation maneuvers in the common femoral, femoral and popliteal veins. COMPARISON:  None Available. FINDINGS: RIGHT LOWER EXTREMITY Common Femoral Vein: No evidence of thrombus. Normal compressibility, respiratory phasicity and response to augmentation. Saphenofemoral Junction: Small amount of echogenic thrombus at the saphenofemoral junction. Profunda Femoral Vein: No evidence of thrombus. Normal compressibility and flow on color Doppler imaging. Femoral Vein: No evidence of thrombus. Normal compressibility, respiratory phasicity and response to augmentation. Popliteal Vein: No evidence of thrombus. Normal compressibility, respiratory phasicity and response to augmentation. Calf Veins: Visualized right deep calf veins are patent without thrombus. Other Findings:  None. LEFT LOWER EXTREMITY Common Femoral Vein: Positive for thrombus. Echogenic thrombus in left  common femoral vein. Saphenofemoral Junction: No evidence of thrombus. Normal compressibility and flow on color Doppler imaging. Profunda Femoral Vein: Positive for thrombus. Femoral Vein: Positive for thrombus. Popliteal Vein: Positive for thrombus Calf Veins: Limited evaluation. Other Findings:  None. IMPRESSION: 1. Positive for DVT in the left lower extremity. Thrombus involving the left common femoral vein, profunda femoral vein, femoral vein and popliteal vein. 2. Small amount of thrombus at the right saphenofemoral junction. Electronically Signed   By: Richarda Overlie M.D.   On: 07/18/2023 11:41   CT CHEST ABDOMEN PELVIS W CONTRAST Result Date: 07/17/2023 CLINICAL DATA:  Sepsis.  Mental status change.  Fever. EXAM: CT CHEST, ABDOMEN, AND PELVIS WITH CONTRAST TECHNIQUE: Multidetector CT imaging of the chest, abdomen and pelvis was performed following the standard protocol during bolus administration of intravenous contrast. RADIATION DOSE REDUCTION: This exam was performed according to the departmental dose-optimization program which includes automated exposure control, adjustment of the mA and/or kV according to patient size and/or use of iterative reconstruction technique. CONTRAST:  OMNIPAQUE IOHEXOL 300 MG/ML  SOLN COMPARISON:  Chest radiograph from earlier today. 12/09/2016 chest CT. 10/09/2016 CT chest, abdomen and pelvis. FINDINGS: CT CHEST FINDINGS Cardiovascular: Normal heart size. No significant pericardial effusion/thickening. Atherosclerotic nonaneurysmal thoracic aorta. Normal caliber pulmonary arteries. Acute segmental pulmonary embolism in the lingula (series 5/image 70). No more central PE. Mediastinum/Nodes: Hypodense 1.4 cm right thyroid nodule. No follow-up imaging is recommended.Unremarkable esophagus. No pathologically enlarged axillary, mediastinal or hilar lymph nodes. Lungs/Pleura: No pneumothorax. No pleural effusion. No acute consolidative airspace disease, lung masses or  significant pulmonary nodules. Mild diffuse bronchial wall thickening. Musculoskeletal: No aggressive appearing focal osseous lesions. Moderate thoracic spondylosis. CT ABDOMEN PELVIS FINDINGS Hepatobiliary: Normal liver size. Lobulated benign-appearing 4.6 cm posterior right liver cyst with additional scattered subcentimeter hypodense right liver lesions that are too small to characterize. Normal gallbladder with no radiopaque cholelithiasis. No biliary ductal dilatation. Pancreas: Normal, with no mass or duct dilation. Spleen: Normal size spleen. Subcentimeter hypodense superior splenic lesion is too small to characterize. No additional splenic lesions. Adrenals/Urinary Tract: Normal adrenals. No hydronephrosis. Numerous scattered simple bilateral renal cysts up to the 3.6 cm in the upper left kidney with numerous additional subcentimeter hypodense bilateral renal cortical lesions that are too small to characterize, for which no follow-up imaging is recommended. 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 Normal bladder. Stomach/Bowel: Normal non-distended stomach. Normal caliber small bowel with no small bowel wall thickening. Appendix not discretely visualized. Marked left colonic diverticulosis, most prominent in the sigmoid colon. Chronic wall thickening in the proximal sigmoid colon, which appears chronically adherent to the left ovary, not substantially changed from 10/09/2016 CT abdomen/pelvis study, favoring chronic diverticulosis. No acute large bowel wall thickening or acute pericolonic fat stranding. Vascular/Lymphatic: Atherosclerotic nonaneurysmal abdominal aorta. Patent portal, splenic, hepatic and renal veins. No pathologically enlarged lymph nodes in the abdomen or pelvis. Reproductive: Status post hysterectomy, with no abnormal findings at the vaginal cuff. No adnexal mass. Other: No pneumoperitoneum, ascites or focal fluid collection. Musculoskeletal: No aggressive  appearing focal osseous lesions. Moderate lumbar spondylosis. IMPRESSION: 1. Incidental acute segmental pulmonary embolism in the lingula. No more central PE. 2. No acute pulmonary parenchymal abnormality. Mild diffuse bronchial wall thickening, suggesting bronchitis. 3. No acute abnormality in the abdomen or pelvis. 4. Marked left colonic diverticulosis, most prominent in the sigmoid colon. Chronic wall thickening in the  proximal sigmoid colon, which appears chronically adherent to the left ovary, not substantially changed from 10/09/2016 CT abdomen/pelvis study, favoring chronic diverticulosis. 5. 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 6.  Aortic Atherosclerosis (ICD10-I70.0). Critical Value/emergent results were called by telephone at the time of interpretation on 07/17/2023 at 2:48 pm to provider [facility] , who verbally acknowledged these results. Electronically Signed   By: Delbert Phenix M.D.   On: 07/17/2023 14:50   CT Head Wo Contrast Result Date: 07/17/2023 CLINICAL DATA:  Mental status change, unknown cause. Recent diagnosis with influenza fever. EXAM: CT HEAD WITHOUT CONTRAST TECHNIQUE: Contiguous axial images were obtained from the base of the skull through the vertex without intravenous contrast. RADIATION DOSE REDUCTION: This exam was performed according to the departmental dose-optimization program which includes automated exposure control, adjustment of the mA and/or kV according to patient size and/or use of iterative reconstruction technique. COMPARISON:  CT head without contrast 05/15/2023 FINDINGS: Brain: No acute infarct, hemorrhage, or mass lesion is present. Moderate atrophy and diffuse white matter disease is stable. Basal ganglia are stable. The ventricles are of normal size. No significant extraaxial fluid collection is present. Atherosclerotic calcifications are present at the cavernous internal carotid arteries bilaterally. No other significant edema  is present. Midline structures are within normal limits. The brainstem and cerebellum are within normal limits. Vascular: Minimal atherosclerotic calcifications are present within the cavernous internal carotid arteries bilaterally. No hyperdense vessel is present. Skull: Calvarium is intact. Calvarium is intact. No focal lytic or blastic lesions are present. No significant extracranial soft tissue lesion is present. Sinuses/Orbits: Minimal mucosal thickening is present along the inferior right maxillary sinus and inferior right frontal sinus. No fluid levels are present. The paranasal sinuses and mastoid air cells are otherwise clear. Bilateral lens replacements are noted. Globes and orbits are otherwise unremarkable. IMPRESSION: 1. No acute intracranial abnormality or significant interval change. 2. Stable atrophy and diffuse white matter disease. This likely reflects the sequela of chronic microvascular ischemia. 3. Minimal right maxillary and frontal sinus disease. Electronically Signed   By: Marin Roberts M.D.   On: 07/17/2023 14:37   DG Chest Port 1 View Result Date: 07/17/2023 CLINICAL DATA:  Fever.  Diagnosed with flu yesterday. EXAM: PORTABLE CHEST 1 VIEW COMPARISON:  05/15/2023 FINDINGS: Stable cardiomediastinal silhouette. Aortic atherosclerotic calcification. Low lung volumes accentuate pulmonary vascularity. Left basilar atelectasis. Otherwise no focal consolidation, pleural effusion, or pneumothorax. No displaced rib fractures. IMPRESSION: Low lung volumes with left basilar atelectasis. Electronically Signed   By: Minerva Fester M.D.   On: 07/17/2023 12:36    Microbiology: Recent Results (from the past 240 hours)  Resp panel by RT-PCR (RSV, Flu A&B, Covid) Anterior Nasal Swab     Status: Abnormal   Collection Time: 07/17/23 12:15 PM   Specimen: Anterior Nasal Swab  Result Value Ref Range Status   SARS Coronavirus 2 by RT PCR POSITIVE (A) NEGATIVE Final    Comment: (NOTE) SARS-CoV-2  target nucleic acids are DETECTED.  The SARS-CoV-2 RNA is generally detectable in upper respiratory specimens during the acute phase of infection. Positive results are indicative of the presence of the identified virus, but do not rule out bacterial infection or co-infection with other pathogens not detected by the test. Clinical correlation with patient history and other diagnostic information is necessary to determine patient infection status. The expected result is Negative.  Fact Sheet for Patients: BloggerCourse.com  Fact Sheet for Healthcare Providers: SeriousBroker.it  This test is not  yet approved or cleared by the [place] and  has been authorized for detection and/or diagnosis of SARS-CoV-2 by FDA under an Emergency Use Authorization (EUA).  This EUA will remain in effect (meaning this test can be used) for the duration of  the COVID-19 declaration under Section 564(b)(1) of the A ct, 21 U.S.C. section 360bbb-3(b)(1), unless the authorization is terminated or revoked sooner.     Influenza A by PCR NEGATIVE NEGATIVE Final   Influenza B by PCR NEGATIVE NEGATIVE Final    Comment: (NOTE) The Xpert Xpress SARS-CoV-2/FLU/RSV plus assay is intended as an aid in the diagnosis of influenza from Nasopharyngeal swab specimens and should not be used as a sole basis for treatment. Nasal washings and aspirates are unacceptable for Xpert Xpress SARS-CoV-2/FLU/RSV testing.  Fact Sheet for Patients: BloggerCourse.com  Fact Sheet for Healthcare Providers: SeriousBroker.it  This test is not yet approved or cleared by the [place] FDA and has been authorized for detection and/or diagnosis of SARS-CoV-2 by FDA under an Emergency Use Authorization (EUA). This EUA will remain in effect (meaning this test can be used) for the duration of the COVID-19 declaration under Section  564(b)(1) of the Act, 21 U.S.C. section 360bbb-3(b)(1), unless the authorization is terminated or revoked.     Resp Syncytial Virus by PCR NEGATIVE NEGATIVE Final    Comment: (NOTE) Fact Sheet for Patients: BloggerCourse.com  Fact Sheet for Healthcare Providers: SeriousBroker.it  This test is not yet approved or cleared by the [place] FDA and has been authorized for detection and/or diagnosis of SARS-CoV-2 by FDA under an Emergency Use Authorization (EUA). This EUA will remain in effect (meaning this test can be used) for the duration of the COVID-19 declaration under Section 564(b)(1) of the Act, 21 U.S.C. section 360bbb-3(b)(1), unless the authorization is terminated or revoked.  Performed at [facility], [address], [place], [place] [address]   Blood Culture (routine x 2)     Status: None (Preliminary result)   Collection Time: 07/17/23 12:46 PM   Specimen: BLOOD RIGHT ARM  Result Value Ref Range Status   Specimen Description   Final    BLOOD RIGHT ARM BOTTLES DRAWN AEROBIC AND ANAEROBIC   Special Requests Blood Culture adequate volume  Final   Culture   Final    NO GROWTH 4 DAYS Performed at [facility], [address] [place], [place] [address]    Report Status PENDING  Incomplete  Blood Culture (routine x 2)     Status: None (Preliminary result)   Collection Time: 07/17/23 12:46 PM   Specimen: BLOOD LEFT HAND  Result Value Ref Range Status   Specimen Description   Final    BLOOD LEFT HAND BOTTLES DRAWN AEROBIC AND ANAEROBIC   Special Requests Blood Culture adequate volume  Final   Culture   Final    NO GROWTH 4 DAYS Performed at [facility], [address] [place], [place] [address]    Report Status PENDING  Incomplete  MRSA Next Gen by PCR, Nasal     Status: None   Collection Time: 07/20/23  2:45 PM   Specimen: Nasal Mucosa; Nasal Swab  Result Value Ref Range Status   MRSA by PCR Next Gen NOT  DETECTED NOT DETECTED Final    Comment: (NOTE) The GeneXpert MRSA Assay (FDA approved for NASAL specimens only), is one component of a comprehensive MRSA colonization surveillance program. It is not intended to diagnose MRSA infection nor to guide or monitor treatment for  MRSA infections. Test performance is not FDA approved in patients less than 33 years old. Performed at [facility], [address], [place], [place] [address]      Labs: Basic Metabolic Panel: Recent Labs  Lab 07/17/23 1246 07/17/23 1621 07/18/23 0425 07/19/23 0429 07/20/23 0425  NA 140  --  139 138 139  K 3.2*  --  3.7 4.1 3.9  CL 101  --  103 102 101  CO2 27  --  26 26 26   GLUCOSE 108*  --  98 157* 156*  BUN 23  --  22 35* 56*  CREATININE 0.79  --  0.75 1.05* 1.98*  CALCIUM 8.8*  --  8.6* 8.6* 8.5*  MG  --  1.7  --  2.0  --    Liver Function Tests: Recent Labs  Lab 07/17/23 1246 07/19/23 0429 07/20/23 0425  AST 12* 13* 12*  ALT 9 5 6   ALKPHOS 74 72 64  BILITOT 0.7 0.4 0.4  PROT 6.1* 6.5 6.1*  ALBUMIN 2.7* 2.7* 2.6*   No results for input(s): "LIPASE", "AMYLASE" in the last 168 hours. No results for input(s): "AMMONIA" in the last 168 hours. CBC: Recent Labs  Lab 07/17/23 1246 07/18/23 0425 07/19/23 0429 07/20/23 0425  WBC 10.0 11.3* 14.8* 24.4*  NEUTROABS 8.4*  --   --   --   HGB 10.0* 9.9* 9.7* 8.9*  HCT 30.7* 32.0* 30.0* 27.3*  MCV 91.9 92.8 92.3 91.9  PLT 193 190 192 192   Cardiac Enzymes: No results for input(s): "CKTOTAL", "CKMB", "CKMBINDEX", "TROPONINI" in the last 168 hours. D-Dimer No results for input(s): "DDIMER" in the last 72 hours. BNP: Invalid input(s): "POCBNP" CBG: Recent Labs  Lab 07/19/23 1623 07/19/23 2221 07/20/23 0741 07/20/23 1140 07/20/23 1606  GLUCAP 269* 136* 163* 152* 148*   Anemia work up Recent Labs    07/19/23 0429 07/20/23 0425  FERRITIN 211 225   Urinalysis    Component Value Date/Time   COLORURINE YELLOW 07/17/2023 1335    APPEARANCEUR CLEAR 07/17/2023 1335   LABSPEC 1.027 07/17/2023 1335   PHURINE 7.0 07/17/2023 1335   GLUCOSEU NEGATIVE 07/17/2023 1335   HGBUR MODERATE (A) 07/17/2023 1335   HGBUR negative 10/17/2008 1020   BILIRUBINUR NEGATIVE 07/17/2023 1335   KETONESUR NEGATIVE 07/17/2023 1335   PROTEINUR 100 (A) 07/17/2023 1335   UROBILINOGEN 1.0 10/17/2008 1020   NITRITE NEGATIVE 07/17/2023 1335   LEUKOCYTESUR NEGATIVE 07/17/2023 1335   Sepsis Labs Recent Labs  Lab 07/17/23 1246 07/18/23 0425 07/19/23 0429 07/20/23 0425  WBC 10.0 11.3* 14.8* 24.4*       SIGNED:  Rhetta Mura, MD  [facility] 07/12/2023, 5:26 PM Pager   If 7PM-7AM, please contact night-coverage [email or web address] Password [email or web address]

## 2023-08-06 NOTE — Discharge Summary (Signed)
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 Gout, HTN, HLD, Parkinson's disease Recently bedbound for over a month and was barely mobile using a wheelchair   Admitted from home to 02/25/2024 generalized body pain weakness lethargy was diagnosed with the flu as an outpatient allegedly but here COVID was positive Sodium 140 potassium 3.2 BUN/creatinine 23/0.7 BNP 107 lactic acid 0.9 WBC 10.0 hemoglobin 10.0 platelet 193 UA negative blood cultures performed and negative so far Chest x-ray low lung volumes distal atelectasis As D-dimer was elevated >20 CT chest was performed-this showed incidental acute segmental PE and lingula no central PE no other parenchymal abnormality, marked left diverticulosis 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 CT head no acute intracranial abnormality or significant interval change stable atrophy diffuse white matter disease   The patient was initially treated for acute pulmonary embolism with heparin and was given Paxlovid started on IV Solu-Medrol and subsequently decompensated on 2/12 with worsening hypoxia and respiratory distress culminating in what look like impending respiratory failure IPAL undertaken with patient and family and the daughter who is an LPN as well as the husband who is the main decision maker elected to pursue eventually comfort care after precipitous decline Patient was placed on comfort medications trajectory you was changed to full comfort and patient ultimately passed at around 1652 on 08-18-23 Patient was pronounced by nursing staff chaplain was informed and came to bedside     Final Diagnoses:  Acute respiratory  failure secondary to aspiration pneumonia COVID-19 Acute pulmonary embolism secondary to COVID           The results of significant diagnostics from this hospitalization (including imaging, microbiology, ancillary and laboratory) are listed below for reference.     Significant Diagnostic Studies:  Imaging Results  DG CHEST PORT 1 VIEW Result Date: 07/20/2023 CLINICAL DATA:  Fever EXAM: PORTABLE CHEST 1 VIEW COMPARISON:  August 14, 2023, CT 14-Aug-2023 FINDINGS: Hypoventilatory changes. Interval patchy airspace disease at left greater than right lung base. Possible small left effusion. Stable cardiomediastinal silhouette with aortic atherosclerosis. No pneumothorax IMPRESSION: Interval patchy airspace disease at left greater than right lung base suspicious for pneumonia. Possible small left effusion. Electronically Signed   By: Jasmine Pang M.D.   On: 07/20/2023 15:22    ECHOCARDIOGRAM COMPLETE Result Date: 07/19/2023    ECHOCARDIOGRAM REPORT   Patient Name:   [name] Date of Exam: 07/19/2023 Medical Rec #:  [id number]       Height:       62.0 in Accession #:    [id number]      Weight:       140.0 lb Date of Birth:  12-13-1940       BSA:          1.643 m Patient Age:    82 years        BP:           143/89 mmHg Patient Gender: F               HR:           92 bpm. [facility] Procedure: 2D Echo, Cardiac Doppler and Color  Doppler Indications:    Pulmonary Embolism  History:        Patient has prior history of Echocardiogram examinations, most                 recent 09/12/2016. Signs/Symptoms:Covid +; Risk                 Factors:Hypertension and Diabetes.  Sonographer:    Amy Chionchio Referring Phys: [id number] Heloise Beecham EMOKPAE IMPRESSIONS  1. Moderate intracavitary gradient of 39 mm Hg noted likely from vigorous contraction of left ventricle. Left ventricular ejection fraction, by estimation, is >75%. The left ventricle has hyperdynamic function. The left ventricle has no regional wall  motion abnormalities. Left ventricular diastolic parameters are consistent with Grade I diastolic dysfunction (impaired relaxation).  2. Right ventricular systolic function was not well visualized. The right ventricular size is normal. There is normal pulmonary artery systolic pressure.  3. The mitral valve is grossly normal. No evidence of mitral valve regurgitation. No evidence of mitral stenosis.  4. The aortic valve is tricuspid. Aortic valve regurgitation is mild. No aortic stenosis is present.  5. The inferior vena cava is normal in size with greater than 50% respiratory variability, suggesting right atrial pressure of 3 mmHg. Comparison(s): No prior Echocardiogram. FINDINGS  Left Ventricle: Left ventricular ejection fraction, by estimation, is >75%. The left ventricle has hyperdynamic function. The left ventricle has no regional wall motion abnormalities. The left ventricular internal cavity size was small. There is no left  ventricular hypertrophy. Left ventricular diastolic parameters are consistent with Grade I diastolic dysfunction (impaired relaxation). Normal left ventricular filling pressure. Right Ventricle: The right ventricular size is normal. No increase in right ventricular wall thickness. Right ventricular systolic function was not well visualized. There is normal pulmonary artery systolic pressure. The tricuspid regurgitant velocity is  2.17 m/s, and with an assumed right atrial pressure of 3 mmHg, the estimated right ventricular systolic pressure is 21.8 mmHg. Left Atrium: Left atrial size was normal in size. Right Atrium: Right atrial size was normal in size. Pericardium: There is no evidence of pericardial effusion. Mitral Valve: The mitral valve is grossly normal. Mild to moderate mitral annular calcification. No evidence of mitral valve regurgitation. No evidence of mitral valve stenosis. MV peak gradient, 6.4 mmHg. The mean mitral valve gradient is 3.0 mmHg. Tricuspid Valve: The tricuspid  valve is normal in structure. Tricuspid valve regurgitation is trivial. No evidence of tricuspid stenosis. Aortic Valve: The aortic valve is tricuspid. Aortic valve regurgitation is mild. No aortic stenosis is present. Aortic valve mean gradient measures 5.0 mmHg. Aortic valve peak gradient measures 7.8 mmHg. Pulmonic Valve: The pulmonic valve was normal in structure. Pulmonic valve regurgitation is not visualized. No evidence of pulmonic stenosis. Aorta: The aortic root and ascending aorta are structurally normal, with no evidence of dilitation. Venous: The inferior vena cava is normal in size with greater than 50% respiratory variability, suggesting right atrial pressure of 3 mmHg. IAS/Shunts: No atrial level shunt detected by color flow Doppler.  LEFT VENTRICLE PLAX 2D LVIDd:         2.00 cm     Diastology LVIDs:         1.30 cm     LV e' medial:    4.57 cm/s LV PW:         0.80 cm     LV E/e' medial:  12.9 LV IVS:        1.50 cm     LV e'  lateral:   6.42 cm/s LVOT diam:     2.00 cm     LV E/e' lateral: 9.2 LVOT Area:     3.14 cm  LV Volumes (MOD) LV vol d, MOD A2C: 45.0 ml LV vol d, MOD A4C: 57.5 ml LV vol s, MOD A2C: 11.3 ml LV vol s, MOD A4C: 13.5 ml LV SV MOD A2C:     33.7 ml LV SV MOD A4C:     57.5 ml LV SV MOD BP:      39.3 ml RIGHT VENTRICLE          IVC RV Basal diam:  3.40 cm  IVC diam: 1.60 cm TAPSE (M-mode): 1.8 cm LEFT ATRIUM             Index        RIGHT ATRIUM           Index LA Vol (A2C):   30.8 ml 18.75 ml/m  RA Area:     12.80 cm LA Vol (A4C):   37.2 ml 22.64 ml/m  RA Volume:   28.70 ml  17.47 ml/m LA Biplane Vol: 37.0 ml 22.52 ml/m  AORTIC VALVE               PULMONIC VALVE AV Vmax:      140.00 cm/s  PV Vmax:       0.79 m/s AV Vmean:     105.000 cm/s PV Peak grad:  2.5 mmHg AV VTI:       0.208 m AV Peak Grad: 7.8 mmHg AV Mean Grad: 5.0 mmHg  AORTA Ao Root diam: 3.10 cm Ao Asc diam:  3.50 cm MITRAL VALVE                TRICUSPID VALVE MV Area (PHT): 3.15 cm     TR Peak grad:   18.8 mmHg  MV Peak grad:  6.4 mmHg     TR Vmax:        217.00 cm/s MV Mean grad:  3.0 mmHg MV Vmax:       1.26 m/s     SHUNTS MV Vmean:      76.4 cm/s    Systemic Diam: 2.00 cm MV Decel Time: 241 msec MV E velocity: 59.10 cm/s MV A velocity: 103.00 cm/s MV E/A ratio:  0.57 Vishnu Priya Mallipeddi Electronically signed by Winfield Rast Mallipeddi Signature Date/Time: 07/19/2023/1:46:10 PM    Final     US Venous Img Lower Bilateral (DVT) Result Date: 07/18/2023 CLINICAL DATA:  Pulmonary embolism. Evaluate for lower extremity DVT. EXAM: BILATERAL LOWER EXTREMITY VENOUS DOPPLER ULTRASOUND TECHNIQUE: Gray-scale sonography with graded compression, as well as color Doppler and duplex ultrasound were performed to evaluate the lower extremity deep venous systems from the level of the common femoral vein and including the common femoral, femoral, profunda femoral, popliteal and calf veins including the posterior tibial, peroneal and gastrocnemius veins when visible. The superficial great saphenous vein was also interrogated. Spectral Doppler was utilized to evaluate flow at rest and with distal augmentation maneuvers in the common femoral, femoral and popliteal veins. COMPARISON:  None Available. FINDINGS: RIGHT LOWER EXTREMITY Common Femoral Vein: No evidence of thrombus. Normal compressibility, respiratory phasicity and response to augmentation. Saphenofemoral Junction: Small amount of echogenic thrombus at the saphenofemoral junction. Profunda Femoral Vein: No evidence of thrombus. Normal compressibility and flow on color Doppler imaging. Femoral Vein: No evidence of thrombus. Normal compressibility, respiratory phasicity and response to augmentation. Popliteal Vein: No evidence of thrombus. Normal compressibility,  respiratory phasicity and response to augmentation. Calf Veins: Visualized right deep calf veins are patent without thrombus. Other Findings:  None. LEFT LOWER EXTREMITY Common Femoral Vein: Positive for thrombus.  Echogenic thrombus in left common femoral vein. Saphenofemoral Junction: No evidence of thrombus. Normal compressibility and flow on color Doppler imaging. Profunda Femoral Vein: Positive for thrombus. Femoral Vein: Positive for thrombus. Popliteal Vein: Positive for thrombus Calf Veins: Limited evaluation. Other Findings:  None. IMPRESSION: 1. Positive for DVT in the left lower extremity. Thrombus involving the left common femoral vein, profunda femoral vein, femoral vein and popliteal vein. 2. Small amount of thrombus at the right saphenofemoral junction. Electronically Signed   By: Richarda Overlie M.D.   On: 07/18/2023 11:41    CT CHEST ABDOMEN PELVIS W CONTRAST Result Date: 07/17/2023 CLINICAL DATA:  Sepsis.  Mental status change.  Fever. EXAM: CT CHEST, ABDOMEN, AND PELVIS WITH CONTRAST TECHNIQUE: Multidetector CT imaging of the chest, abdomen and pelvis was performed following the standard protocol during bolus administration of intravenous contrast. RADIATION DOSE REDUCTION: This exam was performed according to the departmental dose-optimization program which includes automated exposure control, adjustment of the mA and/or kV according to patient size and/or use of iterative reconstruction technique. CONTRAST:  OMNIPAQUE IOHEXOL 300 MG/ML  SOLN COMPARISON:  Chest radiograph from earlier today. 12/09/2016 chest CT. 10/09/2016 CT chest, abdomen and pelvis. FINDINGS: CT CHEST FINDINGS Cardiovascular: Normal heart size. No significant pericardial effusion/thickening. Atherosclerotic nonaneurysmal thoracic aorta. Normal caliber pulmonary arteries. Acute segmental pulmonary embolism in the lingula (series 5/image 70). No more central PE. Mediastinum/Nodes: Hypodense 1.4 cm right thyroid nodule. No follow-up imaging is recommended.Unremarkable esophagus. No pathologically enlarged axillary, mediastinal or hilar lymph nodes. Lungs/Pleura: No pneumothorax. No pleural effusion. No acute consolidative airspace  disease, lung masses or significant pulmonary nodules. Mild diffuse bronchial wall thickening. Musculoskeletal: No aggressive appearing focal osseous lesions. Moderate thoracic spondylosis. CT ABDOMEN PELVIS FINDINGS Hepatobiliary: Normal liver size. Lobulated benign-appearing 4.6 cm posterior right liver cyst with additional scattered subcentimeter hypodense right liver lesions that are too small to characterize. Normal gallbladder with no radiopaque cholelithiasis. No biliary ductal dilatation. Pancreas: Normal, with no mass or duct dilation. Spleen: Normal size spleen. Subcentimeter hypodense superior splenic lesion is too small to characterize. No additional splenic lesions. Adrenals/Urinary Tract: Normal adrenals. No hydronephrosis. Numerous scattered simple bilateral renal cysts up to the 3.6 cm in the upper left kidney with numerous additional subcentimeter hypodense bilateral renal cortical lesions that are too small to characterize, for which no follow-up imaging is recommended. 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 Normal bladder. Stomach/Bowel: Normal non-distended stomach. Normal caliber small bowel with no small bowel wall thickening. Appendix not discretely visualized. Marked left colonic diverticulosis, most prominent in the sigmoid colon. Chronic wall thickening in the proximal sigmoid colon, which appears chronically adherent to the left ovary, not substantially changed from 10/09/2016 CT abdomen/pelvis study, favoring chronic diverticulosis. No acute large bowel wall thickening or acute pericolonic fat stranding. Vascular/Lymphatic: Atherosclerotic nonaneurysmal abdominal aorta. Patent portal, splenic, hepatic and renal veins. No pathologically enlarged lymph nodes in the abdomen or pelvis. Reproductive: Status post hysterectomy, with no abnormal findings at the vaginal cuff. No adnexal mass. Other: No pneumoperitoneum, ascites or focal fluid collection.  Musculoskeletal: No aggressive appearing focal osseous lesions. Moderate lumbar spondylosis. IMPRESSION: 1. Incidental acute segmental pulmonary embolism in the lingula. No more central PE. 2. No acute pulmonary parenchymal abnormality. Mild diffuse bronchial wall thickening, suggesting bronchitis. 3. No acute abnormality  in the abdomen or pelvis. 4. Marked left colonic diverticulosis, most prominent in the sigmoid colon. Chronic wall thickening in the proximal sigmoid colon, which appears chronically adherent to the left ovary, not substantially changed from 10/09/2016 CT abdomen/pelvis study, favoring chronic diverticulosis. 5. 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 6.  Aortic Atherosclerosis (ICD10-I70.0). Critical Value/emergent results were called by telephone at the time of interpretation on 07/17/2023 at 2:48 pm to provider [facility] , who verbally acknowledged these results. Electronically Signed   By: Delbert Phenix M.D.   On: 07/17/2023 14:50    CT Head Wo Contrast Result Date: 07/17/2023 CLINICAL DATA:  Mental status change, unknown cause. Recent diagnosis with influenza fever. EXAM: CT HEAD WITHOUT CONTRAST TECHNIQUE: Contiguous axial images were obtained from the base of the skull through the vertex without intravenous contrast. RADIATION DOSE REDUCTION: This exam was performed according to the departmental dose-optimization program which includes automated exposure control, adjustment of the mA and/or kV according to patient size and/or use of iterative reconstruction technique. COMPARISON:  CT head without contrast 05/15/2023 FINDINGS: Brain: No acute infarct, hemorrhage, or mass lesion is present. Moderate atrophy and diffuse white matter disease is stable. Basal ganglia are stable. The ventricles are of normal size. No significant extraaxial fluid collection is present. Atherosclerotic calcifications are present at the cavernous internal carotid arteries  bilaterally. No other significant edema is present. Midline structures are within normal limits. The brainstem and cerebellum are within normal limits. Vascular: Minimal atherosclerotic calcifications are present within the cavernous internal carotid arteries bilaterally. No hyperdense vessel is present. Skull: Calvarium is intact. Calvarium is intact. No focal lytic or blastic lesions are present. No significant extracranial soft tissue lesion is present. Sinuses/Orbits: Minimal mucosal thickening is present along the inferior right maxillary sinus and inferior right frontal sinus. No fluid levels are present. The paranasal sinuses and mastoid air cells are otherwise clear. Bilateral lens replacements are noted. Globes and orbits are otherwise unremarkable. IMPRESSION: 1. No acute intracranial abnormality or significant interval change. 2. Stable atrophy and diffuse white matter disease. This likely reflects the sequela of chronic microvascular ischemia. 3. Minimal right maxillary and frontal sinus disease. Electronically Signed   By: Marin Roberts M.D.   On: 07/17/2023 14:37    DG Chest Port 1 View Result Date: 07/17/2023 CLINICAL DATA:  Fever.  Diagnosed with flu yesterday. EXAM: PORTABLE CHEST 1 VIEW COMPARISON:  05/15/2023 FINDINGS: Stable cardiomediastinal silhouette. Aortic atherosclerotic calcification. Low lung volumes accentuate pulmonary vascularity. Left basilar atelectasis. Otherwise no focal consolidation, pleural effusion, or pneumothorax. No displaced rib fractures. IMPRESSION: Low lung volumes with left basilar atelectasis. Electronically Signed   By: Minerva Fester M.D.   On: 07/17/2023 12:36       Microbiology:        Recent Results (from the past 240 hours)  Resp panel by RT-PCR (RSV, Flu A&B, Covid) Anterior Nasal Swab     Status: Abnormal    Collection Time: 07/17/23 12:15 PM    Specimen: Anterior Nasal Swab  Result Value Ref Range Status    SARS Coronavirus 2 by RT PCR  POSITIVE (A) NEGATIVE Final      Comment: (NOTE) SARS-CoV-2 target nucleic acids are DETECTED.   The SARS-CoV-2 RNA is generally detectable in upper respiratory specimens during the acute phase of infection. Positive results are indicative of the presence of the identified virus, but do not rule out bacterial infection or co-infection with other pathogens not detected by the test.  Clinical correlation with patient history and other diagnostic information is necessary to determine patient infection status. The expected result is Negative.   Fact Sheet for Patients: BloggerCourse.com   Fact Sheet for Healthcare Providers: SeriousBroker.it   This test is not yet approved or cleared by the [place] FDA and  has been authorized for detection and/or diagnosis of SARS-CoV-2 by FDA under an Emergency Use Authorization (EUA).  This EUA will remain in effect (meaning this test can be used) for the duration of  the COVID-19 declaration under Section 564(b)(1) of the A ct, 21 U.S.C. section 360bbb-3(b)(1), unless the authorization is terminated or revoked sooner.        Influenza A by PCR NEGATIVE NEGATIVE Final    Influenza B by PCR NEGATIVE NEGATIVE Final      Comment: (NOTE) The Xpert Xpress SARS-CoV-2/FLU/RSV plus assay is intended as an aid in the diagnosis of influenza from Nasopharyngeal swab specimens and should not be used as a sole basis for treatment. Nasal washings and aspirates are unacceptable for Xpert Xpress SARS-CoV-2/FLU/RSV testing.   Fact Sheet for Patients: BloggerCourse.com   Fact Sheet for Healthcare Providers: SeriousBroker.it   This test is not yet approved or cleared by the [place] FDA and has been authorized for detection and/or diagnosis of SARS-CoV-2 by FDA under an Emergency Use Authorization (EUA). This EUA will remain in effect (meaning this  test can be used) for the duration of the COVID-19 declaration under Section 564(b)(1) of the Act, 21 U.S.C. section 360bbb-3(b)(1), unless the authorization is terminated or revoked.        Resp Syncytial Virus by PCR NEGATIVE NEGATIVE Final      Comment: (NOTE) Fact Sheet for Patients: BloggerCourse.com   Fact Sheet for Healthcare Providers: SeriousBroker.it   This test is not yet approved or cleared by the [place] FDA and has been authorized for detection and/or diagnosis of SARS-CoV-2 by FDA under an Emergency Use Authorization (EUA). This EUA will remain in effect (meaning this test can be used) for the duration of the COVID-19 declaration under Section 564(b)(1) of the Act, 21 U.S.C. section 360bbb-3(b)(1), unless the authorization is terminated or revoked.   Performed at [facility], [address], [place], [place] [address]    Blood Culture (routine x 2)     Status: None (Preliminary result)    Collection Time: 07/17/23 12:46 PM    Specimen: BLOOD RIGHT ARM  Result Value Ref Range Status    Specimen Description     Final      BLOOD RIGHT ARM BOTTLES DRAWN AEROBIC AND ANAEROBIC    Special Requests Blood Culture adequate volume   Final    Culture     Final      NO GROWTH 4 DAYS Performed at [facility], [address], [place], [place] [address]      Report Status PENDING   Incomplete  Blood Culture (routine x 2)     Status: None (Preliminary result)    Collection Time: 07/17/23 12:46 PM    Specimen: BLOOD LEFT HAND  Result Value Ref Range Status    Specimen Description     Final      BLOOD LEFT HAND BOTTLES DRAWN AEROBIC AND ANAEROBIC    Special Requests Blood Culture adequate volume   Final    Culture     Final      NO GROWTH 4 DAYS Performed at [facility], [address], [place], [place] [address]  Report Status PENDING   Incomplete  MRSA Next Gen by PCR, Nasal     Status: None    Collection Time:  07/20/23  2:45 PM    Specimen: Nasal Mucosa; Nasal Swab  Result Value Ref Range Status    MRSA by PCR Next Gen NOT DETECTED NOT DETECTED Final      Comment: (NOTE) The GeneXpert MRSA Assay (FDA approved for NASAL specimens only), is one component of a comprehensive MRSA colonization surveillance program. It is not intended to diagnose MRSA infection nor to guide or monitor treatment for MRSA infections. Test performance is not FDA approved in patients less than 33 years old. Performed at [facility], [address] [place], [place] [address]        Labs: Basic Metabolic Panel: Last Labs         Recent Labs  Lab 07/17/23 1246 07/17/23 1621 07/18/23 0425 07/19/23 0429 07/20/23 0425  NA 140  --  139 138 139  K 3.2*  --  3.7 4.1 3.9  CL 101  --  103 102 101  CO2 27  --  26 26 26   GLUCOSE 108*  --  98 157* 156*  BUN 23  --  22 35* 56*  CREATININE 0.79  --  0.75 1.05* 1.98*  CALCIUM 8.8*  --  8.6* 8.6* 8.5*  MG  --  1.7  --  2.0  --       Liver Function Tests: Last Labs       Recent Labs  Lab 07/17/23 1246 07/19/23 0429 07/20/23 0425  AST 12* 13* 12*  ALT 9 5 6   ALKPHOS 74 72 64  BILITOT 0.7 0.4 0.4  PROT 6.1* 6.5 6.1*  ALBUMIN 2.7* 2.7* 2.6*      Last Labs  No results for input(s): "LIPASE", "AMYLASE" in the last 168 hours.   Last Labs  No results for input(s): "AMMONIA" in the last 168 hours.   CBC: Last Labs        Recent Labs  Lab 07/17/23 1246 07/18/23 0425 07/19/23 0429 07/20/23 0425  WBC 10.0 11.3* 14.8* 24.4*  NEUTROABS 8.4*  --   --   --   HGB 10.0* 9.9* 9.7* 8.9*  HCT 30.7* 32.0* 30.0* 27.3*  MCV 91.9 92.8 92.3 91.9  PLT 193 190 192 192      Cardiac Enzymes: Last Labs  No results for input(s): "CKTOTAL", "CKMB", "CKMBINDEX", "TROPONINI" in the last 168 hours.   D-Dimer Recent Labs (last 2 labs)  No results for input(s): "DDIMER" in the last 72 hours.   BNP: Last Labs  Invalid input(s): "POCBNP"   CBG: Last Labs          Recent Labs  Lab 07/19/23 1623 07/19/23 2221 07/20/23 0741 07/20/23 1140 07/20/23 1606  GLUCAP 269* 136* 163* 152* 148*      Anemia work up Recent Labs (last 2 labs)      Recent Labs    07/19/23 0429 07/20/23 0425  FERRITIN 211 225      Urinalysis Labs (Brief)          Component Value Date/Time    COLORURINE YELLOW 07/17/2023 1335    APPEARANCEUR CLEAR 07/17/2023 1335    LABSPEC 1.027 07/17/2023 1335    PHURINE 7.0 07/17/2023 1335    GLUCOSEU NEGATIVE 07/17/2023 1335    HGBUR MODERATE (A) 07/17/2023 1335    HGBUR negative 10/17/2008 1020    BILIRUBINUR NEGATIVE 07/17/2023 1335    KETONESUR NEGATIVE 07/17/2023  1335    PROTEINUR 100 (A) 07/17/2023 1335    UROBILINOGEN 1.0 10/17/2008 1020    NITRITE NEGATIVE 07/17/2023 1335    LEUKOCYTESUR NEGATIVE 07/17/2023 1335      Sepsis Labs Last Labs        Recent Labs  Lab 07/17/23 1246 07/18/23 0425 07/19/23 0429 07/20/23 0425  WBC 10.0 11.3* 14.8* 24.4*              SIGNED:   Rhetta Mura, MD      [facility] 07/26/2023, 5:26 PM Pager    If 7PM-7AM, please contact night-coverage [email or web address] Password [email or web address]

## 2023-08-06 NOTE — Progress Notes (Signed)
Chaplain visited Pt, who was not available, she was sleeping. Pt's husband and Chaplain had a moment to talk and reflect about this moment in Pt's life. Pt's husband reviewed his life with his wife and expressed his gratitude with God for allowing almost 61 years of marriage, always sustained by God. Chaplain invited Pt's husband to recall part of the family life they have together for all these years, and being grateful for having the privilege to have great grandchildren. Pt's husband referred his faith as his coping tool. Chaplain and Pt's husband prayed together. Pt's husband was grateful with this visit.  Oneida Alar Chaplain Resident    07/20/2023 1612  Spiritual Encounters  Type of Visit Initial  Care provided to: Family  Referral source Chaplain team  Reason for visit End-of-life  OnCall Visit No  Spiritual Framework  Presenting Themes Values and beliefs;Courage hope and growth;Meaning/purpose/sources of inspiration  Community/Connection Family  Patient Stress Factors None identified  Family Stress Factors None identified  Interventions  Spiritual Care Interventions Made Meaning making;Explored values/beliefs/practices/strengths;Narrative/life review;Prayer  Intervention Outcomes  Outcomes Connection to spiritual care;Awareness of support;Connected to spiritual community

## 2023-08-06 NOTE — Progress Notes (Signed)
Chaplain responded to a call to see family after Pt's death. Chaplain was able to go back and offered compassionate presence for family and prayed for Pt. Pt's husband and Pt's sister were in the room and were grateful to have Chaplin there for this time.  Oneida Alar Chaplain Resident    07/29/2023 1700  Spiritual Encounters  Type of Visit Follow up  Care provided to: [facility] partners present during encounter Nurse  Referral source Nurse (RN/NT/LPN);Chaplain assessment  Reason for visit Patient death  OnCall Visit No

## 2023-08-06 NOTE — Progress Notes (Signed)
SLP Cancellation Note  Patient Details Name: [name] MRN: [id number] DOB: 1941/05/10   Cancelled treatment:       Reason Eval/Treat Not Completed: Other (comment) (Pt transferred to higher level of care and will likely be comfort care given change in status. SLP will sign off at this time. Reconsult should the need arise.)  Thank you,  Havery Moros, CCC-SLP [phone]  Jeanmarie Mccowen 07/31/2023, 8:49 AM

## 2023-08-06 NOTE — Progress Notes (Addendum)
TR[facility] ROUNDING  NOTE [name] EAV:[id number]  DOB: 1940/09/07  DOA: 07/17/2023  PCP: Benita Stabile, MD  07/14/2023,10:28 AM   LOS: 2 days      Code Status: full  From: Home  current Dispo: Likely home   83 year old female known history of right total knee arthroplasty 2019 Gout, HTN, HLD, Parkinson's disease Recently bedbound for over a month and was barely mobile using a wheelchair  Admitted from home to 02/25/2024 generalized body pain weakness lethargy was diagnosed with the flu as an outpatient allegedly but here COVID was positive Sodium 140 potassium 3.2 BUN/creatinine 23/0.7 BNP 107 lactic acid 0.9 WBC 10.0 hemoglobin 10.0 platelet 193 UA negative blood cultures performed and negative so far Chest x-ray low lung volumes distal atelectasis As D-dimer was elevated >20 CT chest was performed-this showed incidental acute segmental PE and lingula no central PE no other parenchymal abnormality, marked left diverticulosis 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 to aid with comfort  AKI Goal of care is now comfort no further labs Acute pulmonary embolism secondary to immobility/COVID Stop all treatments--paxlovid/anticoag/remdisivir not compatible with comfort COVID-19 Is now full comfort Gout Hold allopurinol  HTN Hold amlodipine chlorthalidone  Parkinson's Stopping carbidopa and gabapentin   Detail discussion with patient's spouse and  daughter at the bedside-we have elected on comfort measures.  Patient may d/c home tomorrow with hospice if still here [facility] for med-surg tx   DVT prophylaxis: Lovenox therapeutic  Status is: Inpatient Remains inpatient appropriate because:   Remains ill   Subjective:  Patient calm but slightly rapid RR Unresponsive Multiple Family at bedside  Objective + exam Vitals:   07/15/2023 0340 08/04/2023 0400 07/18/2023 0402 07/18/2023 0700  BP: (!) 85/46     Pulse: (!) 103 (!) 105 (!) 104 (!) 107  Resp: (!) 32 (!) 33 (!) 32 (!) 35  Temp:      TempSrc:      SpO2: (!) 87% (!) 86% (!) 86% (!) 86%  Weight:      Height:       Filed Weights   07/17/23 1152 07/20/23 1423  Weight: 63.5 kg 68.9 kg    Examination:  EOMI NCAT no focal deficit no icterus no pallor Chest decreased air entry p tachcyardic  Data Reviewed: reviewed     Scheduled Meds:  scopolamine  1 patch Transdermal Q72H   Continuous Infusions:    Time 26  Rhetta Mura, MD  [facility]

## 2023-08-06 DEATH — deceased

## 2023-09-05 ENCOUNTER — Ambulatory Visit: Payer: Medicare HMO | Admitting: Family Medicine
